# Patient Record
Sex: Male | Born: 1940
Health system: Southern US, Community
[De-identification: ages and names within clinical notes are randomized; demographics above are authoritative.]

## PROBLEM LIST (undated history)

## (undated) DIAGNOSIS — M549 Dorsalgia, unspecified: Secondary | ICD-10-CM

## (undated) DIAGNOSIS — R112 Nausea with vomiting, unspecified: Secondary | ICD-10-CM

## (undated) DIAGNOSIS — R351 Nocturia: Secondary | ICD-10-CM

## (undated) DIAGNOSIS — Z860100 Personal history of colon polyps, unspecified: Secondary | ICD-10-CM

## (undated) DIAGNOSIS — G8929 Other chronic pain: Secondary | ICD-10-CM

## (undated) DIAGNOSIS — R142 Eructation: Secondary | ICD-10-CM

## (undated) DIAGNOSIS — Z9889 Other specified postprocedural states: Secondary | ICD-10-CM

## (undated) DIAGNOSIS — L853 Xerosis cutis: Secondary | ICD-10-CM

## (undated) DIAGNOSIS — K219 Gastro-esophageal reflux disease without esophagitis: Secondary | ICD-10-CM

## (undated) DIAGNOSIS — I10 Essential (primary) hypertension: Secondary | ICD-10-CM

## (undated) HISTORY — PX: BACK SURGERY: SHX140

## (undated) HISTORY — PX: EYE SURGERY: SHX253

## (undated) HISTORY — PX: TONSILLECTOMY: SUR1361

## (undated) HISTORY — PX: COLONOSCOPY: SHX174

---
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
## 1998-06-15 HISTORY — PX: CERVICAL FUSION: SHX112

## 1998-12-10 ENCOUNTER — Encounter: Payer: Self-pay | Admitting: Neurosurgery

## 1998-12-10 ENCOUNTER — Inpatient Hospital Stay (HOSPITAL_COMMUNITY): Admission: RE | Admit: 1998-12-10 | Discharge: 1998-12-11 | Payer: Self-pay | Admitting: Neurosurgery

## 2000-04-08 ENCOUNTER — Ambulatory Visit (HOSPITAL_COMMUNITY): Admission: RE | Admit: 2000-04-08 | Discharge: 2000-04-08 | Payer: Self-pay | Admitting: Gastroenterology

## 2001-10-06 ENCOUNTER — Encounter: Payer: Self-pay | Admitting: Family Medicine

## 2001-10-06 ENCOUNTER — Ambulatory Visit (HOSPITAL_COMMUNITY): Admission: RE | Admit: 2001-10-06 | Discharge: 2001-10-06 | Payer: Self-pay | Admitting: Family Medicine

## 2005-12-28 ENCOUNTER — Ambulatory Visit: Payer: Self-pay | Admitting: Internal Medicine

## 2005-12-29 ENCOUNTER — Ambulatory Visit: Payer: Self-pay | Admitting: Internal Medicine

## 2006-01-20 ENCOUNTER — Ambulatory Visit: Payer: Self-pay | Admitting: Internal Medicine

## 2006-06-15 HISTORY — PX: CATARACT EXTRACTION: SUR2

## 2006-11-15 ENCOUNTER — Ambulatory Visit: Payer: Self-pay | Admitting: Internal Medicine

## 2006-11-15 LAB — CONVERTED CEMR LAB
ALT: 34 units/L (ref 0–40)
AST: 26 units/L (ref 0–37)
Albumin: 3.9 g/dL (ref 3.5–5.2)
Alkaline Phosphatase: 79 units/L (ref 39–117)
BUN: 23 mg/dL (ref 6–23)
Basophils Absolute: 0 10*3/uL (ref 0.0–0.1)
Basophils Relative: 0.7 % (ref 0.0–1.0)
Bilirubin Urine: NEGATIVE
Bilirubin, Direct: 0.2 mg/dL (ref 0.0–0.3)
CO2: 31 meq/L (ref 19–32)
Calcium: 9 mg/dL (ref 8.4–10.5)
Chloride: 105 meq/L (ref 96–112)
Cholesterol: 209 mg/dL (ref 0–200)
Creatinine, Ser: 1.1 mg/dL (ref 0.4–1.5)
Direct LDL: 115.5 mg/dL
Eosinophils Absolute: 0.1 10*3/uL (ref 0.0–0.6)
Eosinophils Relative: 2.4 % (ref 0.0–5.0)
GFR calc Af Amer: 87 mL/min
GFR calc non Af Amer: 72 mL/min
Glucose, Bld: 113 mg/dL — ABNORMAL HIGH (ref 70–99)
HCT: 46.9 % (ref 39.0–52.0)
HDL: 34.9 mg/dL — ABNORMAL LOW (ref >39.0)
Hemoglobin, Urine: NEGATIVE
Hemoglobin: 16 g/dL (ref 13.0–17.0)
Ketones, ur: NEGATIVE mg/dL
Leukocytes, UA: NEGATIVE
Lymphocytes Relative: 28.9 % (ref 12.0–46.0)
MCHC: 34.1 g/dL (ref 30.0–36.0)
MCV: 84 fL (ref 78.0–100.0)
Monocytes Absolute: 0.5 10*3/uL (ref 0.2–0.7)
Monocytes Relative: 8.2 % (ref 3.0–11.0)
Neutro Abs: 3.8 10*3/uL (ref 1.4–7.7)
Neutrophils Relative %: 59.8 % (ref 43.0–77.0)
Nitrite: NEGATIVE
PSA: 0.31 ng/mL (ref 0.10–4.00)
Platelets: 200 10*3/uL (ref 150–400)
Potassium: 4.7 meq/L (ref 3.5–5.1)
RBC: 5.59 M/uL (ref 4.22–5.81)
RDW: 12.8 % (ref 11.5–14.6)
Sodium: 141 meq/L (ref 135–145)
Specific Gravity, Urine: 1.025 (ref 1.000–1.03)
TSH: 0.97 microintl units/mL (ref 0.35–5.50)
Total Bilirubin: 1.5 mg/dL — ABNORMAL HIGH (ref 0.3–1.2)
Total CHOL/HDL Ratio: 6
Total Protein, Urine: NEGATIVE mg/dL
Total Protein: 6.8 g/dL (ref 6.0–8.3)
Triglycerides: 183 mg/dL — ABNORMAL HIGH (ref 0–149)
Urine Glucose: NEGATIVE mg/dL
Urobilinogen, UA: 0.2 (ref 0.0–1.0)
VLDL: 37 mg/dL (ref 0–40)
WBC: 6.2 10*3/uL (ref 4.5–10.5)
pH: 6 (ref 5.0–8.0)

## 2006-11-16 ENCOUNTER — Ambulatory Visit: Payer: Self-pay | Admitting: Internal Medicine

## 2006-12-08 ENCOUNTER — Ambulatory Visit: Payer: Self-pay

## 2007-08-23 ENCOUNTER — Ambulatory Visit: Payer: Self-pay | Admitting: Internal Medicine

## 2007-08-23 DIAGNOSIS — R1012 Left upper quadrant pain: Secondary | ICD-10-CM

## 2007-08-23 DIAGNOSIS — I739 Peripheral vascular disease, unspecified: Secondary | ICD-10-CM | POA: Insufficient documentation

## 2007-08-23 DIAGNOSIS — R1032 Left lower quadrant pain: Secondary | ICD-10-CM | POA: Insufficient documentation

## 2007-08-26 ENCOUNTER — Ambulatory Visit: Payer: Self-pay | Admitting: Internal Medicine

## 2007-08-26 LAB — CONVERTED CEMR LAB
ALT: 34 units/L (ref 0–53)
AST: 24 units/L (ref 0–37)
Albumin: 4.1 g/dL (ref 3.5–5.2)
Alkaline Phosphatase: 85 units/L (ref 39–117)
BUN: 17 mg/dL (ref 6–23)
Basophils Absolute: 0 10*3/uL (ref 0.0–0.1)
Basophils Relative: 0.5 % (ref 0.0–1.0)
Bilirubin Urine: NEGATIVE
Bilirubin, Direct: 0.2 mg/dL (ref 0.0–0.3)
CO2: 33 meq/L — ABNORMAL HIGH (ref 19–32)
Calcium: 9.2 mg/dL (ref 8.4–10.5)
Chloride: 103 meq/L (ref 96–112)
Creatinine, Ser: 1.1 mg/dL (ref 0.4–1.5)
Eosinophils Absolute: 0.1 10*3/uL (ref 0.0–0.6)
Eosinophils Relative: 1.8 % (ref 0.0–5.0)
GFR calc Af Amer: 87 mL/min
GFR calc non Af Amer: 72 mL/min
Glucose, Bld: 153 mg/dL — ABNORMAL HIGH (ref 70–99)
HCT: 50 % (ref 39.0–52.0)
Hemoglobin, Urine: NEGATIVE
Hemoglobin: 16.6 g/dL (ref 13.0–17.0)
Ketones, ur: NEGATIVE mg/dL
Leukocytes, UA: NEGATIVE
Lymphocytes Relative: 27.2 % (ref 12.0–46.0)
MCHC: 33.2 g/dL (ref 30.0–36.0)
MCV: 85.2 fL (ref 78.0–100.0)
Monocytes Absolute: 0.6 10*3/uL (ref 0.2–0.7)
Monocytes Relative: 8.6 % (ref 3.0–11.0)
Neutro Abs: 4.1 10*3/uL (ref 1.4–7.7)
Neutrophils Relative %: 61.9 % (ref 43.0–77.0)
Nitrite: NEGATIVE
Platelets: 194 10*3/uL (ref 150–400)
Potassium: 4.4 meq/L (ref 3.5–5.1)
RBC: 5.87 M/uL — ABNORMAL HIGH (ref 4.22–5.81)
RDW: 13.1 % (ref 11.5–14.6)
Sed Rate: 4 mm/hr (ref 0–20)
Sodium: 141 meq/L (ref 135–145)
Specific Gravity, Urine: 1.025 (ref 1.000–1.03)
Total Bilirubin: 1.5 mg/dL — ABNORMAL HIGH (ref 0.3–1.2)
Total Protein, Urine: NEGATIVE mg/dL
Total Protein: 7.1 g/dL (ref 6.0–8.3)
Urine Glucose: NEGATIVE mg/dL
Urobilinogen, UA: 0.2 (ref 0.0–1.0)
WBC: 6.6 10*3/uL (ref 4.5–10.5)
pH: 5.5 (ref 5.0–8.0)

## 2007-11-29 ENCOUNTER — Telehealth: Payer: Self-pay | Admitting: Internal Medicine

## 2008-04-24 ENCOUNTER — Ambulatory Visit: Payer: Self-pay | Admitting: Internal Medicine

## 2008-04-24 DIAGNOSIS — K921 Melena: Secondary | ICD-10-CM | POA: Insufficient documentation

## 2008-04-24 DIAGNOSIS — K649 Unspecified hemorrhoids: Secondary | ICD-10-CM | POA: Insufficient documentation

## 2008-04-25 LAB — CONVERTED CEMR LAB
ALT: 36 units/L (ref 0–53)
AST: 30 units/L (ref 0–37)
Albumin: 4.2 g/dL (ref 3.5–5.2)
Alkaline Phosphatase: 74 units/L (ref 39–117)
BUN: 21 mg/dL (ref 6–23)
Basophils Absolute: 0 10*3/uL (ref 0.0–0.1)
Basophils Relative: 0.1 % (ref 0.0–3.0)
Bilirubin Urine: NEGATIVE
Bilirubin, Direct: 0.2 mg/dL (ref 0.0–0.3)
CO2: 30 meq/L (ref 19–32)
Calcium: 9.5 mg/dL (ref 8.4–10.5)
Chloride: 104 meq/L (ref 96–112)
Cholesterol: 185 mg/dL (ref 0–200)
Creatinine, Ser: 1.1 mg/dL (ref 0.4–1.5)
Eosinophils Absolute: 0.1 10*3/uL (ref 0.0–0.7)
Eosinophils Relative: 1.3 % (ref 0.0–5.0)
GFR calc Af Amer: 86 mL/min
GFR calc non Af Amer: 71 mL/min
Glucose, Bld: 112 mg/dL — ABNORMAL HIGH (ref 70–99)
HCT: 47.7 % (ref 39.0–52.0)
HDL: 32.9 mg/dL — ABNORMAL LOW (ref >39.0)
Hemoglobin, Urine: NEGATIVE
Hemoglobin: 16.6 g/dL (ref 13.0–17.0)
Ketones, ur: NEGATIVE mg/dL
LDL Cholesterol: 126 mg/dL — ABNORMAL HIGH (ref 0–99)
Leukocytes, UA: NEGATIVE
Lymphocytes Relative: 29 % (ref 12.0–46.0)
MCHC: 34.7 g/dL (ref 30.0–36.0)
MCV: 86.1 fL (ref 78.0–100.0)
Monocytes Absolute: 0.7 10*3/uL (ref 0.1–1.0)
Monocytes Relative: 10.2 % (ref 3.0–12.0)
Neutro Abs: 4.2 10*3/uL (ref 1.4–7.7)
Neutrophils Relative %: 59.4 % (ref 43.0–77.0)
Nitrite: NEGATIVE
PSA: 0.39 ng/mL (ref 0.10–4.00)
Platelets: 199 10*3/uL (ref 150–400)
Potassium: 5.1 meq/L (ref 3.5–5.1)
RBC: 5.55 M/uL (ref 4.22–5.81)
RDW: 12.9 % (ref 11.5–14.6)
Sodium: 140 meq/L (ref 135–145)
Specific Gravity, Urine: 1.01 (ref 1.000–1.03)
TSH: 1.02 microintl units/mL (ref 0.35–5.50)
Total Bilirubin: 1.7 mg/dL — ABNORMAL HIGH (ref 0.3–1.2)
Total CHOL/HDL Ratio: 5.6
Total Protein, Urine: NEGATIVE mg/dL
Total Protein: 7.1 g/dL (ref 6.0–8.3)
Triglycerides: 129 mg/dL (ref 0–149)
Urine Glucose: NEGATIVE mg/dL
Urobilinogen, UA: 0.2 (ref 0.0–1.0)
VLDL: 26 mg/dL (ref 0–40)
WBC: 7.1 10*3/uL (ref 4.5–10.5)
pH: 7 (ref 5.0–8.0)

## 2008-05-18 ENCOUNTER — Ambulatory Visit: Payer: Self-pay

## 2008-05-18 ENCOUNTER — Encounter: Payer: Self-pay | Admitting: Internal Medicine

## 2008-07-16 ENCOUNTER — Ambulatory Visit: Payer: Self-pay | Admitting: Internal Medicine

## 2008-07-16 DIAGNOSIS — I889 Nonspecific lymphadenitis, unspecified: Secondary | ICD-10-CM | POA: Insufficient documentation

## 2008-07-16 DIAGNOSIS — J029 Acute pharyngitis, unspecified: Secondary | ICD-10-CM | POA: Insufficient documentation

## 2008-07-17 LAB — CONVERTED CEMR LAB
Basophils Absolute: 0 10*3/uL (ref 0.0–0.1)
Basophils Relative: 0.1 % (ref 0.0–3.0)
Eosinophils Absolute: 0.2 10*3/uL (ref 0.0–0.7)
Eosinophils Relative: 2.3 % (ref 0.0–5.0)
HCT: 45.5 % (ref 39.0–52.0)
Hemoglobin: 16 g/dL (ref 13.0–17.0)
Lymphocytes Relative: 28.6 % (ref 12.0–46.0)
MCHC: 35.2 g/dL (ref 30.0–36.0)
MCV: 85.9 fL (ref 78.0–100.0)
Monocytes Absolute: 0.9 10*3/uL (ref 0.1–1.0)
Monocytes Relative: 9.5 % (ref 3.0–12.0)
Neutro Abs: 5.3 10*3/uL (ref 1.4–7.7)
Neutrophils Relative %: 59.5 % (ref 43.0–77.0)
Platelets: 236 10*3/uL (ref 150–400)
RBC: 5.29 M/uL (ref 4.22–5.81)
RDW: 13 % (ref 11.5–14.6)
WBC: 9 10*3/uL (ref 4.5–10.5)

## 2009-07-04 ENCOUNTER — Ambulatory Visit: Payer: Self-pay | Admitting: Internal Medicine

## 2009-07-04 DIAGNOSIS — Z87891 Personal history of nicotine dependence: Secondary | ICD-10-CM | POA: Insufficient documentation

## 2009-07-05 ENCOUNTER — Telehealth (INDEPENDENT_AMBULATORY_CARE_PROVIDER_SITE_OTHER): Payer: Self-pay | Admitting: *Deleted

## 2009-07-09 ENCOUNTER — Encounter: Payer: Self-pay | Admitting: Internal Medicine

## 2009-07-09 DIAGNOSIS — I6529 Occlusion and stenosis of unspecified carotid artery: Secondary | ICD-10-CM | POA: Insufficient documentation

## 2009-07-10 ENCOUNTER — Ambulatory Visit: Payer: Self-pay

## 2009-07-10 ENCOUNTER — Encounter: Payer: Self-pay | Admitting: Internal Medicine

## 2010-01-31 ENCOUNTER — Ambulatory Visit: Payer: Self-pay | Admitting: Internal Medicine

## 2010-01-31 DIAGNOSIS — M79609 Pain in unspecified limb: Secondary | ICD-10-CM | POA: Insufficient documentation

## 2010-07-13 LAB — CONVERTED CEMR LAB
ALT: 38 units/L (ref 0–53)
AST: 30 units/L (ref 0–37)
Albumin: 4.4 g/dL (ref 3.5–5.2)
Alkaline Phosphatase: 81 units/L (ref 39–117)
BUN: 15 mg/dL (ref 6–23)
Basophils Absolute: 0 10*3/uL (ref 0.0–0.1)
Basophils Relative: 0.7 % (ref 0.0–3.0)
Bilirubin Urine: NEGATIVE
Bilirubin, Direct: 0.2 mg/dL (ref 0.0–0.3)
CO2: 30 meq/L (ref 19–32)
Calcium: 9.5 mg/dL (ref 8.4–10.5)
Chloride: 106 meq/L (ref 96–112)
Cholesterol: 207 mg/dL — ABNORMAL HIGH (ref 0–200)
Creatinine, Ser: 1.1 mg/dL (ref 0.4–1.5)
Direct LDL: 147.1 mg/dL
Eosinophils Absolute: 0.1 10*3/uL (ref 0.0–0.7)
Eosinophils Relative: 1.5 % (ref 0.0–5.0)
GFR calc non Af Amer: 71.07 mL/min (ref >60)
Glucose, Bld: 116 mg/dL — ABNORMAL HIGH (ref 70–99)
HCT: 48.6 % (ref 39.0–52.0)
HDL: 35.4 mg/dL — ABNORMAL LOW (ref >39.00)
Hemoglobin, Urine: NEGATIVE
Hemoglobin: 15.8 g/dL (ref 13.0–17.0)
Ketones, ur: NEGATIVE mg/dL
Leukocytes, UA: NEGATIVE
Lymphocytes Relative: 28.7 % (ref 12.0–46.0)
Lymphs Abs: 1.8 10*3/uL (ref 0.7–4.0)
MCHC: 32.6 g/dL (ref 30.0–36.0)
MCV: 89.2 fL (ref 78.0–100.0)
Monocytes Absolute: 0.5 10*3/uL (ref 0.1–1.0)
Monocytes Relative: 8.8 % (ref 3.0–12.0)
Neutro Abs: 3.8 10*3/uL (ref 1.4–7.7)
Neutrophils Relative %: 60.3 % (ref 43.0–77.0)
Nitrite: NEGATIVE
PSA: 0.35 ng/mL (ref 0.10–4.00)
Platelets: 218 10*3/uL (ref 150.0–400.0)
Potassium: 4.8 meq/L (ref 3.5–5.1)
RBC: 5.45 M/uL (ref 4.22–5.81)
RDW: 13 % (ref 11.5–14.6)
Sodium: 142 meq/L (ref 135–145)
Specific Gravity, Urine: 1.025 (ref 1.000–1.030)
TSH: 1.32 microintl units/mL (ref 0.35–5.50)
Total Bilirubin: 1.6 mg/dL — ABNORMAL HIGH (ref 0.3–1.2)
Total CHOL/HDL Ratio: 6
Total Protein, Urine: NEGATIVE mg/dL
Total Protein: 7.4 g/dL (ref 6.0–8.3)
Triglycerides: 159 mg/dL — ABNORMAL HIGH (ref 0.0–149.0)
Urine Glucose: NEGATIVE mg/dL
Urobilinogen, UA: 0.2 (ref 0.0–1.0)
VLDL: 31.8 mg/dL (ref 0.0–40.0)
WBC: 6.2 10*3/uL (ref 4.5–10.5)
pH: 5 (ref 5.0–8.0)

## 2010-07-15 ENCOUNTER — Encounter: Payer: Self-pay | Admitting: Internal Medicine

## 2010-07-15 ENCOUNTER — Ambulatory Visit: Admission: RE | Admit: 2010-07-15 | Discharge: 2010-07-15 | Payer: Self-pay | Source: Home / Self Care

## 2010-07-17 NOTE — Miscellaneous (Signed)
Summary: Orders Update  Clinical Lists Changes  Problems: Added new problem of CAROTID ARTERY STENOSIS (ICD-433.10) Orders: Added new Test order of Carotid Duplex (Carotid Duplex) - Signed 

## 2010-07-17 NOTE — Assessment & Plan Note (Signed)
Summary: sore throat x 2 wk---stc   Vital Signs:  Patient Profile:   70 Years Old Male Weight:      223 pounds Temp:     97.1 degrees F oral Pulse rate:   64 / minute BP sitting:   118 / 66  (left arm) Cuff size:   large  Vitals Entered By: Zackery Barefoot CMA (July 16, 2008 2:57 PM)                 Chief Complaint:  Sore throat x 3 weeks.  History of Present Illness: C/o ST x 3 wks, not better R>L    Prior Medications Reviewed Using: Patient Recall  Updated Prior Medication List: VITAMIN D3 1000 UNIT  TABS (CHOLECALCIFEROL) 1 by mouth daily FISH OIL 1000 MG CAPS (OMEGA-3 FATTY ACIDS) Take 1 tablet by mouth two times a day LEVITRA 20 MG  TABS (VARDENAFIL HCL) 1 once daily as needed  Current Allergies (reviewed today): ! PCN ! SULFA  Past Medical History:    Reviewed history from 08/23/2007 and no changes required:       Peripheral vascular disease up to 59% RICA stenosis       Elev. glu   Family History:    Reviewed history from 08/23/2007 and no changes required:              F COPD  Social History:    Reviewed history from 08/23/2007 and no changes required:       Retired Building services engineer       Married       Regular exercise-yes: tennis, work outs    Review of Systems  The patient denies fever, hoarseness, chest pain, dyspnea on exertion, peripheral edema, and prolonged cough.     Physical Exam  General:     Well-developed,well-nourished,in no acute distress; alert,appropriate and cooperative throughout examination Ears:     Wax B Nose:     Erythematous throat mucosa and intranasal erythema.  Mouth:     Oral mucosa and oropharynx without lesions or exudates.  Teeth in good repair. Gloved finger exam - no masses Neck:     No deformities, masses, or tenderness noted. Lungs:     Normal respiratory effort, chest expands symmetrically. Lungs are clear to auscultation, no crackles or wheezes. Heart:     Normal rate and regular rhythm. S1 and S2 normal  without gallop, murmur, click, rub or other extra sounds. Skin:     Intact without suspicious lesions or rashes Cervical Nodes:       Enlarged cluster of 2-3 1 cm mobile and tender LNs in R submandibular area    Impression & Recommendations:  Problem # 1:  LYMPHADENITIS (ICD-289.3) R submandib Assessment: New Z pac Orders: TLB-CBC Platelet - w/Differential (85025-CBCD)   Problem # 2:  PHARYNGITIS (ICD-462) Assessment: New  His updated medication list for this problem includes:    Zithromax Z-pak 250 Mg Tabs (Azithromycin) ..... Use as directed   Complete Medication List: 1)  Vitamin D3 1000 Unit Tabs (Cholecalciferol) .Marland Kitchen.. 1 by mouth daily 2)  Fish Oil 1000 Mg Caps (Omega-3 fatty acids) .... Take 1 tablet by mouth two times a day 3)  Levitra 20 Mg Tabs (Vardenafil hcl) .Marland Kitchen.. 1 once daily as needed 4)  Zithromax Z-pak 250 Mg Tabs (Azithromycin) .... Use as directed   Patient Instructions: 1)  Call if you are not better in a reasonable ammount of time or if worse.    Prescriptions: ZITHROMAX Z-PAK  250 MG  TABS (AZITHROMYCIN) Use as directed  #1 x 0   Entered and Authorized by:   Tresa Garter MD   Signed by:   Tresa Garter MD on 07/16/2008   Method used:   Print then Give to Patient   RxID:   [id number]

## 2010-07-17 NOTE — Assessment & Plan Note (Signed)
Summary: LOWER  ABDOMAN PAIN-$50 INFO-STC   Vital Signs:  Patient Profile:   70 Years Old Male Weight:      221 pounds Temp:     97.5 degrees F oral Pulse rate:   66 / minute BP sitting:   137 / 73  (left arm)  Vitals Entered By: Tora Perches (August 23, 2007 8:35 AM)             Is Patient Diabetic? No     Chief Complaint:  Multiple medical problems or concerns.  History of Present Illness: C/o burning pain in L groin x 4-5d severe off and on, no irrad. to genitals; symptoms  mostly present at night. x 1 wk. No LBP. No injury.    Current Allergies: ! PCN ! SULFA  Past Medical History:    Peripheral vascular disease up to 59% RICA stenosis    Elev. glu   Family History:        F COPD  Social History:    Retired Building services engineer    Married    Regular exercise-yes: tennis, work outs   Risk Factors:  Tobacco use:  quit Exercise:  yes   Review of Systems  The patient denies anorexia, fever, chest pain, prolonged cough, hemoptysis, melena, hematochezia, severe indigestion/heartburn, and hematuria.     Physical Exam  General:     Well-developed,well-nourished,in no acute distress; alert,appropriate and cooperative throughout examination Eyes:     No corneal or conjunctival inflammation noted. EOMI. Perrla. Funduscopic exam benign, without hemorrhages, exudates or papilledema. Vision grossly normal. Mouth:     Oral mucosa and oropharynx without lesions or exudates.  Teeth in good repair. Neck:     No deformities, masses, or tenderness noted. Lungs:     Normal respiratory effort, chest expands symmetrically. Lungs are clear to auscultation, no crackles or wheezes. Heart:     Normal rate and regular rhythm. S1 and S2 normal without gallop, murmur, click, rub or other extra sounds. Abdomen:     Sensitive in LLQ, no mass or rebound. L groin is sensitive w/o mass or hernia Genitalia:     Testes bilaterally descended without nodularity, tenderness or masses. No scrotal  masses or lesions. No penis lesions or urethral discharge. Msk:     No deformity or scoliosis noted of thoracic or lumbar spine.   Pulses:     R and L carotid,radial,femoral,dorsalis pedis and posterior tibial pulses are full and equal bilaterally Extremities:     No clubbing, cyanosis, edema, or deformity noted with normal full range of motion of all joints.   Neurologic:     No cranial nerve deficits noted. Station and gait are normal. Plantar reflexes are down-going bilaterally. DTRs are symmetrical throughout. Sensory, motor and coordinative functions appear intact. Skin:     No rash Inguinal Nodes:     No significant adenopathy Psych:     Cognition and judgment appear intact. Alert and cooperative with normal attention span and concentration. No apparent delusions, illusions, hallucinations    Impression & Recommendations:  Problem # 1:  L groin pain x 1 wk Assessment: New Unclear etiol: r/o infection, neuropathy, shingles, sprain, artery dissection, etc. Empiric abx. To ER if worse. Get labs.CT pelvis and L groin. Vicodin prn  Problem # 2:  ABDOMINAL PAIN (ICD-789.00) Assessment: New As above Orders: TLB-BMP (Basic Metabolic Panel-BMET) (80048-METABOL) TLB-Hepatic/Liver Function Pnl (80076-HEPATIC) TLB-Udip ONLY (81003-UDIP) TLB-CBC Platelet - w/Differential (85025-CBCD) TLB-Sedimentation Rate (ESR) (16109-UEA) Radiology Referral (Radiology)   Complete Medication List: 1)  Cipro 500 Mg Tabs (Ciprofloxacin hcl) .Marland Kitchen.. 1 by mouth 2 times daily 2)  Hydrocodone-acetaminophen 5-500 Mg Tabs (Hydrocodone-acetaminophen) .Marland Kitchen.. 1-2 by mouth qid as needed pain 3)  Vitamin D3 1000 Unit Tabs (Cholecalciferol) .Marland Kitchen.. 1 by mouth daily 4)  Lovaza 1 Gm Caps (Omega-3-acid ethyl esters) .... 2 by mouth bid 5)  Aspirin 81 Mg Tbec (Aspirin) .... One by mouth every day   Patient Instructions: 1)  Please schedule a follow-up appointment in 1 weeks.    Prescriptions: CIPRO 500 MG TABS  (CIPROFLOXACIN HCL) 1 by mouth 2 times daily  #20 x 0   Entered and Authorized by:   Tresa Garter MD   Signed by:   Tresa Garter MD on 08/23/2007   Method used:   Print then Give to Patient   RxID:   [id number]  ]

## 2010-07-17 NOTE — Assessment & Plan Note (Signed)
Summary: YEARLY---$50---STC   Vital Signs:  Patient Profile:   70 Years Old Male Weight:      217 pounds Temp:     97.3 degrees F oral Pulse rate:   60 / minute BP sitting:   134 / 76  (left arm)  Vitals Entered By: Tora Perches (April 24, 2008 10:46 AM)                 Chief Complaint:  Preventive Care.  History of Present Illness: The patient presents for a wellness examination   F/u PVD, elev. glu  C/o occ rectal irritation, leaking and a little blood x 18 months after vigurous sports    Current Allergies (reviewed today): ! PCN ! SULFA  Past Medical History:    Reviewed history from 08/23/2007 and no changes required:       Peripheral vascular disease up to 59% RICA stenosis       Elev. glu   Family History:    Reviewed history from 08/23/2007 and no changes required:              F COPD  Social History:    Reviewed history from 08/23/2007 and no changes required:       Retired Building services engineer       Married       Regular exercise-yes: tennis, work outs    Review of Systems  The patient denies anorexia, fever, weight loss, weight gain, vision loss, decreased hearing, hoarseness, chest pain, syncope, dyspnea on exertion, peripheral edema, prolonged cough, headaches, hemoptysis, abdominal pain, melena, hematochezia, severe indigestion/heartburn, hematuria, incontinence, genital sores, muscle weakness, suspicious skin lesions, transient blindness, difficulty walking, depression, unusual weight change, abnormal bleeding, enlarged lymph nodes, angioedema, and testicular masses.     Physical Exam  General:     Well-developed,well-nourished,in no acute distress; alert,appropriate and cooperative throughout examination Head:     Normocephalic and atraumatic without obvious abnormalities. No apparent alopecia or balding. Eyes:     No corneal or conjunctival inflammation noted. EOMI. Perrla. Funduscopic exam benign, without hemorrhages, exudates or papilledema. Vision  grossly normal. Ears:     External ear exam shows no significant lesions or deformities.  Otoscopic examination reveals clear canals, tympanic membranes are intact bilaterally without bulging, retraction, inflammation or discharge. Hearing is grossly normal bilaterally. Nose:     External nasal examination shows no deformity or inflammation. Nasal mucosa are pink and moist without lesions or exudates. Mouth:     Oral mucosa and oropharynx without lesions or exudates.  Teeth in good repair. Neck:     No deformities, masses, or tenderness noted. Lungs:     Normal respiratory effort, chest expands symmetrically. Lungs are clear to auscultation, no crackles or wheezes. Heart:     Normal rate and regular rhythm. S1 and S2 normal without gallop, murmur, click, rub or other extra sounds. Abdomen:     Bowel sounds positive,abdomen soft and non-tender without masses, organomegaly or hernias noted. Rectal:     No external abnormalities noted. Normal sphincter tone. No rectal masses or tenderness. Irritated anal area with a leaked stool Genitalia:     Testes bilaterally descended without nodularity, tenderness or masses. No scrotal masses or lesions. No penis lesions or urethral discharge. Prostate:     Prostate gland firm and smooth, no enlargement, nodularity, tenderness, mass, asymmetry or induration. Msk:     No deformity or scoliosis noted of thoracic or lumbar spine.   Pulses:     R and L carotid,radial,femoral,dorsalis  pedis and posterior tibial pulses are full and equal bilaterally Extremities:     No clubbing, cyanosis, edema, or deformity noted with normal full range of motion of all joints.   Neurologic:     No cranial nerve deficits noted. Station and gait are normal. Plantar reflexes are down-going bilaterally. DTRs are symmetrical throughout. Sensory, motor and coordinative functions appear intact. Skin:     Intact without suspicious lesions or rashes Cervical Nodes:     No  lymphadenopathy noted Inguinal Nodes:     No significant adenopathy Psych:     Cognition and judgment appear intact. Alert and cooperative with normal attention span and concentration. No apparent delusions, illusions, hallucinations    Impression & Recommendations:  Problem # 1:  WELL ADULT EXAM (ICD-V70.0) Assessment: New Had a colonosc 5 y ago - Dr Laural Benes. dT given.    Orders: TLB-BMP (Basic Metabolic Panel-BMET) (80048-METABOL) TLB-CBC Platelet - w/Differential (85025-CBCD) TLB-Lipid Panel (80061-LIPID) TLB-PSA (Prostate Specific Antigen) (84153-PSA) TLB-TSH (Thyroid Stimulating Hormone) (84443-TSH) TLB-Udip ONLY (81003-UDIP) TLB-Hepatic/Liver Function Pnl (80076-HEPATIC)  Reviewed preventive care protocols, scheduled due services, and updated immunizations.   Problem # 2:  HEMORRHOIDS, NOS (ICD-455.6) Assessment: New Rx given  Problem # 3:  PERIPHERAL VASCULAR DISEASE (ICD-443.9) Assessment: Comment Only  Orders: Doppler Referral (Doppler)   Problem # 4:  HEMATOCHEZIA (ICD-578.1) - mild Assessment: Comment Only Had a colon by Dr Laural Benes 5 y ago. Had a pelvic CT 2009 Rx given Orders: Anoscopy (16109)  Reviewed preventive care protocols, scheduled due services, and updated immunizations. Procedure: Anoscopy Indication: Rectal bleeding Risks and benefits were explained. The pt. was placed in the L decubitus position. Digital rectal exam revealed no masses. Anoscope was introduced w/o difficulties. Upon withdrawl, a carefull look at the mucosa was obtained. At 6  o'clock a    9    mm int hemorrhoid was present without active bleeding. Impression: No anal fissure.Internal hemorrhoid. Mild proctitis Disposition: see A&P.  Tolerated well. Complications: none.    Complete Medication List: 1)  Vitamin D3 1000 Unit Tabs (Cholecalciferol) .Marland Kitchen.. 1 by mouth daily 2)  Lovaza 1 Gm Caps (Omega-3-acid ethyl esters) .... 2 by mouth bid 3)  Levitra 20 Mg Tabs (Vardenafil  hcl) .Marland Kitchen.. 1 once daily as needed 4)  Proctocort 30 Mg Supp (Hydrocortisone acetate) .Marland Kitchen.. 1 pr at bedtime x 10 d 5)  Triamcinolone Acetonide 0.1 % Oint (Triamcinolone acetonide) .... Use two times a day  Other Orders: Pneumococcal Vaccine (60454) Admin 1st Vaccine (09811)   Patient Instructions: 1)  Use wet wipes 2)  Avoid cotton underwear   Prescriptions: TRIAMCINOLONE ACETONIDE 0.1 % OINT (TRIAMCINOLONE ACETONIDE) use two times a day  #45 g x 2   Entered and Authorized by:   Tresa Garter MD   Signed by:   Tresa Garter MD on 04/24/2008   Method used:   Print then Give to Patient   RxID:   [id number] PROCTOCORT 30 MG SUPP (HYDROCORTISONE ACETATE) 1 pr at bedtime x 10 d  #10 x 2   Entered and Authorized by:   Tresa Garter MD   Signed by:   Tresa Garter MD on 04/24/2008   Method used:   Print then Give to Patient   RxID:   [id number]  ]  Pneumovax Vaccine    Vaccine Type: Pneumovax    Site: left deltoid    Mfr: Merck    Dose: 0.5 ml    Route: IM    Given by: Erie Noe  Delorise Shiner    Exp. Date: 02/04/2009    Lot #: [id number]    VIS given: 01/11/96 version given April 24, 2008.

## 2010-07-17 NOTE — Progress Notes (Signed)
Summary: REFILL  Phone Note Call from Patient Call back at Home Phone [phone]   Summary of Call: Patient is requesting a refil of levitra 20mg  Initial call taken by: Lamar Sprinkles,  November 29, 2007 3:48 PM  Follow-up for Phone Call        Just received Rx for 12 June 10th!! Will need to wait until Dr. Roena Malady returns. Follow-up by: Jacques Navy MD,  November 30, 2007 9:50 AM  Additional Follow-up for Phone Call Additional follow up Details #1::        No vanessa denied med b/c not on med list Additional Follow-up by: Lamar Sprinkles,  November 30, 2007 11:23 AM    Additional Follow-up for Phone Call Additional follow up Details #2::    I misread that.Marland KitchenMarland KitchenMarland KitchenMarland Kitchenptient will need to see Dr. Roena Malady Follow-up by: Jacques Navy MD,  November 30, 2007 1:12 PM  Additional Follow-up for Phone Call Additional follow up Details #3:: Details for Additional Follow-up Action Taken: Pt needs apt with Dr AVP prior to med refill please.....................Marland KitchenLamar Sprinkles  November 30, 2007 3:41 PM   OK to ref x 3. Last OV in 3/9. Georgina Quint Levell Tavano MD  November 30, 2007 3:48 PM   New/Updated Medications: LEVITRA 20 MG  TABS (VARDENAFIL HCL) 1 once daily as needed   Prescriptions: LEVITRA 20 MG  TABS (VARDENAFIL HCL) 1 once daily as needed  #12 x 3   Entered by:   Lamar Sprinkles   Authorized by:   Tresa Garter MD   Signed by:   Lamar Sprinkles on 11/30/2007   Method used:   Electronically sent to ...       [facility]  [address]  [phone]       [address]       [place], [place]  [address]       Ph: [phone] or [phone]       Fax: [phone]   RxID:   [id number]

## 2010-07-17 NOTE — Assessment & Plan Note (Signed)
Summary: CPX/MEDICARE/#/CD   Vital Signs:  Patient profile:   70 year old male Height:      74 inches Weight:      217 pounds BMI:     27.96 Temp:     98.3 degrees F oral Pulse rate:   55 / minute BP sitting:   158 / 76  (left arm)  Vitals Entered By: Tora Perches (July 04, 2009 9:04 AM) CC: cpx Is Patient Diabetic? No   CC:  cpx.  History of Present Illness: The patient presents for a wellness examination   Preventive Screening-Counseling & Management  Alcohol-Tobacco     Smoking Status: quit  Current Medications (verified): 1)  Vitamin D3 1000 Unit  Tabs (Cholecalciferol) .Marland Kitchen.. 1 By Mouth Daily 2)  Fish Oil 1000 Mg Caps (Omega-3 Fatty Acids) .... Take 1 Tablet By Mouth Two Times A Day 3)  Levitra 20 Mg  Tabs (Vardenafil Hcl) .Marland Kitchen.. 1 Once Daily As Needed  Allergies: 1)  ! Pcn 2)  ! Sulfa  Past History:  Family History: Last updated: 08/23/2007  F COPD  Social History: Last updated: 08/23/2007 Retired CFO Married Regular exercise-yes: tennis, work outs  Past Medical History: Peripheral vascular disease up to 59% RICA stenosis (refusing statins) Elev. glu GI Dr Laural Benes 2005  Family History: Reviewed history from 08/23/2007 and no changes required.  F COPD  Social History: Reviewed history from 08/23/2007 and no changes required. Retired [employer] Married Regular exercise-yes: tennis, work outs  Review of Systems  The patient denies anorexia, fever, weight loss, weight gain, vision loss, decreased hearing, hoarseness, chest pain, syncope, dyspnea on exertion, peripheral edema, prolonged cough, headaches, hemoptysis, abdominal pain, melena, hematochezia, severe indigestion/heartburn, hematuria, incontinence, genital sores, muscle weakness, suspicious skin lesions, transient blindness, difficulty walking, depression, unusual weight change, abnormal bleeding, enlarged lymph nodes, angioedema, and testicular masses.    Physical Exam  General:   Well-developed,well-nourished,in no acute distress; alert,appropriate and cooperative throughout examination Head:  Normocephalic and atraumatic without obvious abnormalities. No apparent alopecia or balding. Eyes:  No corneal or conjunctival inflammation noted. EOMI. Perrla.  Ears:  External ear exam shows no significant lesions or deformities.  Otoscopic examination reveals clear canals, tympanic membranes are intact bilaterally without bulging, retraction, inflammation or discharge. Hearing is grossly normal bilaterally. Nose:  External nasal examination shows no deformity or inflammation. Nasal mucosa are pink and moist without lesions or exudates. Mouth:  Oral mucosa and oropharynx without lesions or exudates.  Teeth in good repair. Neck:  No deformities, masses, or tenderness noted. Lungs:  Normal respiratory effort, chest expands symmetrically. Lungs are clear to auscultation, no crackles or wheezes. Heart:  Normal rate and regular rhythm. S1 and S2 normal without gallop, murmur, click, rub or other extra sounds. Abdomen:  Bowel sounds positive,abdomen soft and non-tender without masses, organomegaly or hernias noted. Rectal:  No external abnormalities noted. Normal sphincter tone. No rectal masses or tenderness. Genitalia:  Testes bilaterally descended without nodularity, tenderness or masses. No scrotal masses or lesions. No penis lesions or urethral discharge. Prostate:  Prostate gland firm and smooth, no enlargement, nodularity, tenderness, mass, asymmetry or induration. Msk:  No deformity or scoliosis noted of thoracic or lumbar spine.   Extremities:  No clubbing, cyanosis, edema, or deformity noted with normal full range of motion of all joints.   Neurologic:  No cranial nerve deficits noted. Station and gait are normal. Plantar reflexes are down-going bilaterally. DTRs are symmetrical throughout. Sensory, motor and coordinative functions appear intact. Skin:  Intact without suspicious  lesions or rashes Cervical Nodes:  No lymphadenopathy noted Inguinal Nodes:  No significant adenopathy Psych:  Cognition and judgment appear intact. Alert and cooperative with normal attention span and concentration. No apparent delusions, illusions, hallucinations   Impression & Recommendations:  Problem # 1:  WELL ADULT EXAM (ICD-V70.0) Assessment New Health and age related issues were discussed. Available screening tests and vaccinations were discussed as well. Healthy life style including good diet and execise was discussed.  Orders: EKG w/ Interpretation (93000) TLB-BMP (Basic Metabolic Panel-BMET) (80048-METABOL) TLB-CBC Platelet - w/Differential (85025-CBCD) TLB-Hepatic/Liver Function Pnl (80076-HEPATIC) TLB-Lipid Panel (80061-LIPID) TLB-PSA (Prostate Specific Antigen) (84153-PSA) TLB-TSH (Thyroid Stimulating Hormone) (84443-TSH) TLB-Udip ONLY (81003-UDIP)  Problem # 2:  PERIPHERAL VASCULAR DISEASE (ICD-443.9) Assessment: Unchanged  Orders: Doppler Referral (Doppler) TLB-BMP (Basic Metabolic Panel-BMET) (80048-METABOL) TLB-CBC Platelet - w/Differential (85025-CBCD) TLB-Hepatic/Liver Function Pnl (80076-HEPATIC) TLB-Lipid Panel (80061-LIPID) TLB-PSA (Prostate Specific Antigen) (84153-PSA) TLB-TSH (Thyroid Stimulating Hormone) (84443-TSH) TLB-Udip ONLY (81003-UDIP)  Problem # 3:  ERECTILE DYSFUNCTION, ORGANIC (ICD-607.84)  His updated medication list for this problem includes:    Levitra 20 Mg Tabs (Vardenafil hcl) .Marland Kitchen... 1 once daily as needed  Complete Medication List: 1)  Vitamin D3 1000 Unit Tabs (Cholecalciferol) .Marland Kitchen.. 1 by mouth daily 2)  Fish Oil 1000 Mg Caps (Omega-3 fatty acids) .... Take 1 tablet by mouth two times a day 3)  Levitra 20 Mg Tabs (Vardenafil hcl) .Marland Kitchen.. 1 once daily as needed 4)  Aspirin 81 Mg Tbec (Aspirin) .Marland Kitchen.. 1 by mouth qd  Patient Instructions: 1)  Nl BP 135/85 or less 2)  Please schedule a follow-up appointment in 6  months. Prescriptions: LEVITRA 20 MG  TABS (VARDENAFIL HCL) 1 once daily as needed  #36 x 3   Entered and Authorized by:   Tresa Garter MD   Signed by:   Tresa Garter MD on 07/04/2009   Method used:   Print then Give to Patient   RxID:   [id number]

## 2010-07-17 NOTE — Assessment & Plan Note (Signed)
Summary: severe back pain radiates into left leg/chiropr/?cortisone sh...   Vital Signs:  Patient profile:   70 year old male Height:      74 inches Weight:      224 pounds BMI:     28.86 O2 Sat:      96 % on Room air Temp:     98.3 degrees F oral Pulse rate:   69 / minute Pulse rhythm:   regular Resp:     16 per minute BP sitting:   144 / 90  (left arm) Cuff size:   regular  Vitals Entered By: Lanier Prude, CMA(AAMA) (January 31, 2010 4:28 PM)  O2 Flow:  Room air CC: LBP/Lt leg pain Is Patient Diabetic? No   CC:  LBP/Lt leg pain.  History of Present Illness: Pulled back 2 wks playing tennis, got worse in 3-4 days, now severe pain in  He went to see a chiropractor x 2  Current Medications (verified): 1)  Vitamin D3 1000 Unit  Tabs (Cholecalciferol) .Marland Kitchen.. 1 By Mouth Daily 2)  Fish Oil 1000 Mg Caps (Omega-3 Fatty Acids) .... Take 1 Tablet By Mouth Two Times A Day 3)  Levitra 20 Mg  Tabs (Vardenafil Hcl) .Marland Kitchen.. 1 Once Daily As Needed 4)  Aspirin 81 Mg Tbec (Aspirin) .Marland Kitchen.. 1 By Mouth Qd  Allergies (verified): 1)  ! Pcn 2)  ! Sulfa  Past History:  Past Medical History: Last updated: 07/04/2009 Peripheral vascular disease up to 59% RICA stenosis (refusing statins) Elev. glu GI Dr Laural Benes 2005  Family History: Last updated: 08/23/2007  F COPD  Social History: Last updated: 08/23/2007 Retired CFO Married Regular exercise-yes: tennis, work outs  Review of Systems       The patient complains of difficulty walking.  The patient denies fever and depression.         no rash  Physical Exam  General:  Well-developed,well-nourished,in no acute distress; alert,appropriate and cooperative throughout examination Eyes:  No corneal or conjunctival inflammation noted. EOMI. Perrla.  Mouth:  Oral mucosa and oropharynx without lesions or exudates.  Teeth in good repair. Neck:  No deformities, masses, or tenderness noted. Lungs:  Normal respiratory effort, chest expands  symmetrically. Lungs are clear to auscultation, no crackles or wheezes. Heart:  Normal rate and regular rhythm. S1 and S2 normal without gallop, murmur, click, rub or other extra sounds. Abdomen:  Bowel sounds positive,abdomen soft and non-tender without masses, organomegaly or hernias noted. Msk:  No deformity or scoliosis noted of thoracic or lumbar spine.  Lumbar-sacral spine is tender to palpation over paraspinal muscles and painfull with the ROM  Neurologic:  L strait leg elev is (=/-) MS OK DTRs OK Skin:  Intact without suspicious lesions or rashes Psych:  Cognition and judgment appear intact. Alert and cooperative with normal attention span and concentration. No apparent delusions, illusions, hallucinations   Impression & Recommendations:  Problem # 1:  LOW BACK PAIN, ACUTE (ICD-724.2) Assessment New Take 40mg  qd for 3 days, then 20 mg qd for 3 days, then 10mg  qd for 6 days, then stop. Take pc.  Use stretching and balance exercises that I have provided (15 min. or longer every day)  His updated medication list for this problem includes:    Aspirin 81 Mg Tbec (Aspirin) .Marland Kitchen... 1 by mouth qd    Hydrocodone-acetaminophen 5-325 Mg Tabs (Hydrocodone-acetaminophen) .Marland Kitchen... 1-2 by mouth two times a day as needed pain  Problem # 2:  LEG PAIN (ICD-729.5) L schiatica Assessment: New Rx  as above  Complete Medication List: 1)  Vitamin D3 1000 Unit Tabs (Cholecalciferol) .Marland Kitchen.. 1 by mouth daily 2)  Fish Oil 1000 Mg Caps (Omega-3 fatty acids) .... Take 1 tablet by mouth two times a day 3)  Levitra 20 Mg Tabs (Vardenafil hcl) .Marland Kitchen.. 1 once daily as needed 4)  Aspirin 81 Mg Tbec (Aspirin) .Marland Kitchen.. 1 by mouth qd 5)  Prednisone 10 Mg Tabs (Prednisone) .... Take 40mg  qd for 3 days, then 20 mg qd for 3 days, then 10mg  qd for 6 days, then stop. take pc. 6)  Hydrocodone-acetaminophen 5-325 Mg Tabs (Hydrocodone-acetaminophen) .Marland Kitchen.. 1-2 by mouth two times a day as needed pain  Patient Instructions: 1)  Call if  you are not better in a reasonable amount of time or if worse. Go to ER if feeling really bad!  Prescriptions: PREDNISONE 10 MG TABS (PREDNISONE) Take 40mg  qd for 3 days, then 20 mg qd for 3 days, then 10mg  qd for 6 days, then stop. Take pc.  #24 x 1   Entered and Authorized by:   Tresa Garter MD   Signed by:   Tresa Garter MD on 01/31/2010   Method used:   Print then Give to Patient   RxID:   [id number] HYDROCODONE-ACETAMINOPHEN 5-325 MG TABS (HYDROCODONE-ACETAMINOPHEN) 1-2 by mouth two times a day as needed pain  #30 x 0   Entered and Authorized by:   Tresa Garter MD   Signed by:   Tresa Garter MD on 01/31/2010   Method used:   Print then Give to Patient   RxID:   [id number] PREDNISONE 10 MG TABS (PREDNISONE) Take 40mg  qd for 3 days, then 20 mg qd for 3 days, then 10mg  qd for 6 days, then stop. Take pc.  #24 x 1   Entered and Authorized by:   Tresa Garter MD   Signed by:   Tresa Garter MD on 01/31/2010   Method used:   Electronically to        [facility] [address] (retail)       [phone]       [place], [place]  [address]       Ph: [phone]       Fax: [phone]   RxID:   [id number]    Medication Administration  Injection # 1:    Medication: Depo- Medrol 40mg   Orders Added: 1)  Est. Patient Level IV [30160]  Appended Document: severe back pain radiates into left leg/chiropr/?cortisone sh...    Clinical Lists Changes  Orders: Added new Service order of Depo- Medrol 40mg  (J1030) - Signed Added new Service order of Depo- Medrol 80mg  (J1040) - Signed Added new Service order of Admin of Therapeutic Inj  intramuscular or subcutaneous (10932) - Signed       Medication Administration  Injection # 1:    Medication: Depo- Medrol 40mg     Diagnosis: LOW BACK PAIN, ACUTE (ICD-724.2)    Route: IM    Site: LUOQ gluteus    Exp Date: 09/13/2012    Lot #: [id number]    Mfr:  Pharmacia    Comments: pt rec 120mg     Patient tolerated injection without complications    Given by: Lanier Prude, CMA(AAMA) (February 12, 2010 10:58 AM)  Injection # 2:    Medication: Depo- Medrol 80mg     Diagnosis: LOW BACK PAIN, ACUTE (ICD-724.2)    Comments: same as above  Patient tolerated injection without complications    Given by: Lanier Prude, [facility]) (February 12, 2010 10:58 AM)  Orders Added: 1)  Depo- Medrol 40mg  [J1030] 2)  Depo- Medrol 80mg  [J1040] 3)  Admin of Therapeutic Inj  intramuscular or subcutaneous [16109]

## 2010-07-17 NOTE — Progress Notes (Signed)
----   Converted from flag ---- ---- 07/05/2009 10:22 AM, Edman Circle wrote: appt 1/26 @ 4:00  ---- 07/04/2009 9:40 AM, Dagoberto Reef wrote: Thanks  ---- 07/04/2009 9:32 AM, Georgina Quint Plotnikov MD wrote: The following orders have been entered for this patient and placed on Admin Hold:  Type:     Referral       Code:   Doppler Description:   Doppler Referral Order Date:   07/04/2009   Authorized By:   Tresa Garter MD Order #:   [id number] Clinical Notes:   Carot Doppler US f/u Dx stenosis ------------------------------

## 2010-08-14 ENCOUNTER — Encounter: Payer: Self-pay | Admitting: Internal Medicine

## 2010-08-20 ENCOUNTER — Other Ambulatory Visit: Payer: BLUE CROSS/BLUE SHIELD

## 2010-08-20 ENCOUNTER — Encounter (INDEPENDENT_AMBULATORY_CARE_PROVIDER_SITE_OTHER): Payer: Self-pay | Admitting: *Deleted

## 2010-08-20 ENCOUNTER — Other Ambulatory Visit: Payer: Self-pay | Admitting: Internal Medicine

## 2010-08-20 DIAGNOSIS — Z0389 Encounter for observation for other suspected diseases and conditions ruled out: Secondary | ICD-10-CM

## 2010-08-20 DIAGNOSIS — Z0000 Encounter for general adult medical examination without abnormal findings: Secondary | ICD-10-CM

## 2010-08-20 LAB — CBC WITH DIFFERENTIAL/PLATELET
Basophils Absolute: 0 10*3/uL (ref 0.0–0.1)
Basophils Relative: 0.5 % (ref 0.0–3.0)
Eosinophils Absolute: 0.1 10*3/uL (ref 0.0–0.7)
Eosinophils Relative: 2.4 % (ref 0.0–5.0)
HCT: 46.3 % (ref 39.0–52.0)
Hemoglobin: 15.9 g/dL (ref 13.0–17.0)
Lymphocytes Relative: 33.8 % (ref 12.0–46.0)
Lymphs Abs: 2.1 10*3/uL (ref 0.7–4.0)
MCHC: 34.3 g/dL (ref 30.0–36.0)
MCV: 86.5 fl (ref 78.0–100.0)
Monocytes Absolute: 0.7 10*3/uL (ref 0.1–1.0)
Monocytes Relative: 11.5 % (ref 3.0–12.0)
Neutro Abs: 3.2 10*3/uL (ref 1.4–7.7)
Neutrophils Relative %: 51.8 % (ref 43.0–77.0)
Platelets: 196 10*3/uL (ref 150.0–400.0)
RBC: 5.35 Mil/uL (ref 4.22–5.81)
RDW: 14.1 % (ref 11.5–14.6)
WBC: 6.3 10*3/uL (ref 4.5–10.5)

## 2010-08-20 LAB — URINALYSIS
Bilirubin Urine: NEGATIVE
Hgb urine dipstick: NEGATIVE
Ketones, ur: NEGATIVE
Leukocytes, UA: NEGATIVE
Nitrite: NEGATIVE
Specific Gravity, Urine: 1.015 (ref 1.000–1.030)
Total Protein, Urine: NEGATIVE
Urine Glucose: NEGATIVE
Urobilinogen, UA: 0.2 (ref 0.0–1.0)
pH: 6 (ref 5.0–8.0)

## 2010-08-20 LAB — BASIC METABOLIC PANEL
BUN: 21 mg/dL (ref 6–23)
CO2: 28 mEq/L (ref 19–32)
Calcium: 9.3 mg/dL (ref 8.4–10.5)
Chloride: 103 mEq/L (ref 96–112)
Creatinine, Ser: 1 mg/dL (ref 0.4–1.5)
GFR: 76.42 mL/min (ref >60.00)
Glucose, Bld: 114 mg/dL — ABNORMAL HIGH (ref 70–99)
Potassium: 5 mEq/L (ref 3.5–5.1)
Sodium: 140 mEq/L (ref 135–145)

## 2010-08-20 LAB — TSH: TSH: 1.31 u[IU]/mL (ref 0.35–5.50)

## 2010-08-20 LAB — HEPATIC FUNCTION PANEL
ALT: 33 U/L (ref 0–53)
AST: 32 U/L (ref 0–37)
Albumin: 4.1 g/dL (ref 3.5–5.2)
Alkaline Phosphatase: 70 U/L (ref 39–117)
Bilirubin, Direct: 0.2 mg/dL (ref 0.0–0.3)
Total Bilirubin: 1.3 mg/dL — ABNORMAL HIGH (ref 0.3–1.2)
Total Protein: 6.7 g/dL (ref 6.0–8.3)

## 2010-08-20 LAB — LIPID PANEL
Cholesterol: 192 mg/dL (ref 0–200)
HDL: 30.3 mg/dL — ABNORMAL LOW (ref >39.00)
LDL Cholesterol: 136 mg/dL — ABNORMAL HIGH (ref 0–99)
Total CHOL/HDL Ratio: 6
Triglycerides: 131 mg/dL (ref 0.0–149.0)
VLDL: 26.2 mg/dL (ref 0.0–40.0)

## 2010-08-20 LAB — PSA: PSA: 0.39 ng/mL (ref 0.10–4.00)

## 2010-08-27 ENCOUNTER — Ambulatory Visit (INDEPENDENT_AMBULATORY_CARE_PROVIDER_SITE_OTHER)
Admission: RE | Admit: 2010-08-27 | Discharge: 2010-08-27 | Disposition: A | Discharge status: Home / Self Care | Payer: Medicare Other | Source: Ambulatory Visit | Attending: Internal Medicine | Admitting: Internal Medicine

## 2010-08-27 ENCOUNTER — Encounter: Payer: Self-pay | Admitting: Internal Medicine

## 2010-08-27 ENCOUNTER — Other Ambulatory Visit: Payer: Self-pay | Admitting: Internal Medicine

## 2010-08-27 ENCOUNTER — Ambulatory Visit (INDEPENDENT_AMBULATORY_CARE_PROVIDER_SITE_OTHER): Payer: Medicare Other | Admitting: Internal Medicine

## 2010-08-27 DIAGNOSIS — M545 Low back pain, unspecified: Secondary | ICD-10-CM

## 2010-08-27 DIAGNOSIS — N476 Balanoposthitis: Secondary | ICD-10-CM

## 2010-08-27 DIAGNOSIS — L259 Unspecified contact dermatitis, unspecified cause: Secondary | ICD-10-CM | POA: Insufficient documentation

## 2010-08-27 DIAGNOSIS — R209 Unspecified disturbances of skin sensation: Secondary | ICD-10-CM

## 2010-08-27 DIAGNOSIS — M79609 Pain in unspecified limb: Secondary | ICD-10-CM

## 2010-08-27 DIAGNOSIS — Z0000 Encounter for general adult medical examination without abnormal findings: Secondary | ICD-10-CM

## 2010-08-27 DIAGNOSIS — B351 Tinea unguium: Secondary | ICD-10-CM | POA: Insufficient documentation

## 2010-09-02 NOTE — Letter (Signed)
Summary: [facility]   Imported By: Sherian Rein 08/27/2010 09:05:29  _____________________________________________________________________  External Attachment:    Type:   Image     Comment:   External Document

## 2010-09-02 NOTE — Assessment & Plan Note (Signed)
Summary: annual wellness/bcbs,blue medicare   Vital Signs:  Patient profile:   70 year old male Height:      74 inches Weight:      218 pounds BMI:     28.09 Temp:     98.4 degrees F oral Pulse rate:   68 / minute Pulse rhythm:   regular Resp:     16 per minute BP sitting:   140 / 80  (left arm) Cuff size:   regular  Vitals Entered By: Lanier Prude, Beverly Gust) (August 27, 2010 2:02 PM) CC: MWV Is Patient Diabetic? No Comments pt is not taking Prednisone or Hydroco/APAP   CC:  MWV.  History of Present Illness: The patient presents for a preventive health examination  Patient past medical history, social history, and family history reviewed in detail no significant changes.  Patient is physically active. Depression is negative and mood is good. Hearing is normal, and able to perform activities of daily living. Risk of falling is negligible and home safety has been reviewed and is appropriate. Patient has normal height, weight, and visual acuity. Patient has been counseled on age-appropriate routine health concerns for screening and prevention. Education, counseling done.   C/o pain in R buttock down R post thigh and down to leg, R foot gets numb. No injury.  Preventive Screening-Counseling & Management  Alcohol-Tobacco     Alcohol drinks/day: <1     Alcohol type: wine     Smoking Status: quit  Caffeine-Diet-Exercise     Caffeine use/day: 0     Diet Counseling: not indicated; diet is assessed to be healthy     Does Patient Exercise: yes     Type of exercise: tennis,bicycling,golf, [employer]     Times/week: 7     Depression Counseling: not indicated; screening negative for depression  Hep-HIV-STD-Contraception     Hepatitis Risk: no risk noted     Dental Visit-last 6 months yes     TSE monthly: no     Sun Exposure-Excessive: yes  Safety-Violence-Falls     Seat Belt Use: yes     Helmet Use: yes     Firearms in the Home: firearms in the home     Smoke Detectors: yes  Violence in the Home: no risk noted     Sexual Abuse: no     Fall Risk: no      Sexual History:  currently monogamous.    Current Medications (verified): 1)  Vitamin D3 1000 Unit  Tabs (Cholecalciferol) .Marland Kitchen.. 1 By Mouth Daily 2)  Fish Oil 1000 Mg Caps (Omega-3 Fatty Acids) .... Take 1 Tablet By Mouth Two Times A Day 3)  Levitra 20 Mg  Tabs (Vardenafil Hcl) .Marland Kitchen.. 1 Once Daily As Needed 4)  Aspirin 81 Mg Tbec (Aspirin) .Marland Kitchen.. 1 By Mouth Qd 5)  Prednisone 10 Mg Tabs (Prednisone) .... Take 40mg  Qd For 3 Days, Then 20 Mg Qd For 3 Days, Then 10mg  Qd For 6 Days, Then Stop. Take Pc. 6)  Hydrocodone-Acetaminophen 5-325 Mg Tabs (Hydrocodone-Acetaminophen) .Marland Kitchen.. 1-2 By Mouth Two Times A Day As Needed Pain  Allergies (verified): 1)  ! Pcn 2)  ! Sulfa 3)  ! Ibuprofen  Past History:  Past Medical History: Last updated: 07/04/2009 Peripheral vascular disease up to 59% RICA stenosis (refusing statins) Elev. glu GI Dr Laural Benes 2005  Family History: Last updated: 08/23/2007  F COPD  Social History: Last updated: 08/23/2007 Retired CFO Married Regular exercise-yes: tennis, work outs  Past Surgical History: Cervical fusion C5,6,7  2000 Dr Channing Mutters  Social History: Caffeine use/day:  0 Dental Care w/in 6 mos.:  yes Sun Exposure-Excessive:  yes Seat Belt Use:  yes Fall Risk:  no Hepatitis Risk:  no risk noted Sexual History:  currently monogamous  Physical Exam  General:  Well-developed,well-nourished,in no acute distress; alert,appropriate and cooperative throughout examination Head:  Normocephalic and atraumatic without obvious abnormalities. No apparent alopecia or balding. Eyes:  No corneal or conjunctival inflammation noted. EOMI. Perrla.  Ears:  External ear exam shows no significant lesions or deformities.  Otoscopic examination reveals clear canals, tympanic membranes are intact bilaterally without bulging, retraction, inflammation or discharge. Hearing is grossly normal  bilaterally. Nose:  External nasal examination shows no deformity or inflammation. Nasal mucosa are pink and moist without lesions or exudates. Mouth:  Oral mucosa and oropharynx without lesions or exudates.  Teeth in good repair. Neck:  No deformities, masses, or tenderness noted. Lungs:  Normal respiratory effort, chest expands symmetrically. Lungs are clear to auscultation, no crackles or wheezes. Heart:  Normal rate and regular rhythm. S1 and S2 normal without gallop, murmur, click, rub or other extra sounds. Abdomen:  Bowel sounds positive,abdomen soft and non-tender without masses, organomegaly or hernias noted. Rectal:  No external abnormalities noted. Normal sphincter tone. No rectal masses or tenderness. Genitalia:  Rash patch on glans penis Prostate:  Prostate gland firm and smooth, no enlargement, nodularity, tenderness, mass, asymmetry or induration. Msk:  No deformity or scoliosis noted of thoracic or lumbar spine.  Lumbar-sacral spine is tender to palpation over paraspinal muscles and painfull with the ROM  Pulses:  R and L carotid,radial,femoral,dorsalis pedis and posterior tibial pulses are full and equal bilaterally Extremities:  No clubbing, cyanosis, edema, or deformity noted with normal full range of motion of all joints.   Neurologic:  R strait leg elev is (=/-) MS OK DTRs OK Skin:  Intact without suspicious lesions or rashes Cervical Nodes:  No lymphadenopathy noted Psych:  Cognition and judgment appear intact. Alert and cooperative with normal attention span and concentration. No apparent delusions, illusions, hallucinations   Impression & Recommendations:  Problem # 1:  HEALTH MAINTENANCE EXAM (ICD-V70.0) Assessment New  Overall doing well, age appropriate education and counseling updated and referral for appropriate preventive services done unless declined, immunizations up to date or declined, diet counseling done if overweight, urged to quit smoking if smokes,  most recent labs reviewed and current ordered if appropriate, ecg reviewed or declined (interpretation per ECG scanned in the EMR if done); information regarding Medicare Preventation requirements given if appropriate.  I have personally reviewed the Medicare Annual Wellness questionnaire and have noted 1.   The patient's medical and social history 2.   Their use of alcohol, tobacco or illicit drugs 3.   Their current medications and supplements 4.   The patient's functional ability including ADL's, fall risks, home safety risks and hearing or visual             impairment. 5.   Diet and physical activities 6.   Evidence for depression or mood disorders  The patients weight, height, BMI and visual acuity have been recorded in the chart I have made referrals, counseling and provided education to the patient based review of the above and I have provided the pt with a written personalized care plan for preventive services.  Orders: Medicare -1st Annual Wellness Visit [phone])  Problem # 2:  LOW BACK PAIN, ACUTE (ICD-724.2) Assessment: New  The following medications were removed from the  medication list:    Hydrocodone-acetaminophen 5-325 Mg Tabs (Hydrocodone-acetaminophen) .Marland Kitchen... 1-2 by mouth two times a day as needed pain His updated medication list for this problem includes:    Aspirin 81 Mg Tbec (Aspirin) .Marland Kitchen... 1 by mouth qd  Orders: T-Lumbar Spine Complete, 5 Views (71110TC)  Problem # 3:  LEG PAIN (ICD-729.5) R - radiculopathic Assessment: New Predn Take 40mg  qd for 3 days, then 20 mg qd for 3 days, then 10mg  qd for 6 days, then stop. Take pc.   Problem # 4:  PARESTHESIA (ICD-782.0) R Assessment: New  Problem # 5:  BALANITIS (ICD-607.1) Assessment: New On the regimen of medicine(s) reflected in the chart    Problem # 6:  ONYCHOMYCOSIS (ICD-110.1)B     Penlac 8 % Soln (Ciclopirox) ..... Use once daily on affected nail(s). once a week remove the build-up with an alcohol  swab  Complete Medication List: 1)  Vitamin D3 1000 Unit Tabs (Cholecalciferol) .Marland Kitchen.. 1 by mouth daily 2)  Fish Oil 1000 Mg Caps (Omega-3 fatty acids) .... Take 1 tablet by mouth two times a day 3)  Levitra 20 Mg Tabs (Vardenafil hcl) .Marland Kitchen.. 1 once daily as needed 4)  Aspirin 81 Mg Tbec (Aspirin) .Marland Kitchen.. 1 by mouth qd 5)  Prednisone 10 Mg Tabs (Prednisone) .... Take 40mg  qd for 3 days, then 20 mg qd for 3 days, then 10mg  qd for 6 days, then stop. take pc. 6)  Ketoconazole 2 % Crea (Ketoconazole) .... Use two times a day x 1 month 7)  Penlac 8 % Soln (Ciclopirox) .... Use once daily on affected nail(s). once a week remove the build-up with an alcohol swab 8)  Lipitor 10 Mg Tabs (Atorvastatin calcium) .Marland Kitchen.. 1 by mouth once daily for cholesterol  Patient Instructions: 1)  Please schedule a follow-up appointment in 1 month. 2)  Call if you are not better in a reasonable amount of time or if worse.  Prescriptions: LIPITOR 10 MG TABS (ATORVASTATIN CALCIUM) 1 by mouth once daily for cholesterol  #30 x 12   Entered and Authorized by:   Tresa Garter MD   Signed by:   Tresa Garter MD on 08/27/2010   Method used:   Print then Give to Patient   RxID:   [id number] LEVITRA 20 MG  TABS (VARDENAFIL HCL) 1 once daily as needed  #36 x 3   Entered and Authorized by:   Tresa Garter MD   Signed by:   Tresa Garter MD on 08/27/2010   Method used:   Print then Give to Patient   RxID:   [id number] PREDNISONE 10 MG TABS (PREDNISONE) Take 40mg  qd for 3 days, then 20 mg qd for 3 days, then 10mg  qd for 6 days, then stop. Take pc.  #24 x 1   Entered and Authorized by:   Tresa Garter MD   Signed by:   Tresa Garter MD on 08/27/2010   Method used:   Print then Give to Patient   RxID:   [id number] PENLAC 8 % SOLN (CICLOPIROX) Use once daily on affected nail(s). Once a week remove the build-up with an alcohol swab  #1 x 2   Entered and Authorized by:   Tresa Garter MD   Signed by:   Tresa Garter MD on 08/27/2010   Method used:   Print then Give to Patient   RxID:   [id number] KETOCONAZOLE 2 % CREA (KETOCONAZOLE) use two times a day x  1 month  #30 g x 1   Entered and Authorized by:   Tresa Garter MD   Signed by:   Tresa Garter MD on 08/27/2010   Method used:   Print then Give to Patient   RxID:   [id number]    Orders Added: 1)  T-Lumbar Spine Complete, 5 Views [71110TC] 2)  Medicare -1st Annual Wellness Visit [G0438] 3)  Est. Patient Level IV [14782]

## 2010-10-20 ENCOUNTER — Ambulatory Visit (INDEPENDENT_AMBULATORY_CARE_PROVIDER_SITE_OTHER)
Admission: RE | Admit: 2010-10-20 | Discharge: 2010-10-20 | Disposition: A | Discharge status: Home / Self Care | Payer: Medicare Other | Source: Ambulatory Visit | Attending: Internal Medicine | Admitting: Internal Medicine

## 2010-10-20 ENCOUNTER — Encounter: Payer: Self-pay | Admitting: Internal Medicine

## 2010-10-20 ENCOUNTER — Ambulatory Visit (INDEPENDENT_AMBULATORY_CARE_PROVIDER_SITE_OTHER): Payer: Medicare Other | Admitting: Internal Medicine

## 2010-10-20 DIAGNOSIS — I6529 Occlusion and stenosis of unspecified carotid artery: Secondary | ICD-10-CM

## 2010-10-20 DIAGNOSIS — M79609 Pain in unspecified limb: Secondary | ICD-10-CM

## 2010-10-20 DIAGNOSIS — N476 Balanoposthitis: Secondary | ICD-10-CM

## 2010-10-20 DIAGNOSIS — M545 Low back pain, unspecified: Secondary | ICD-10-CM

## 2010-10-20 MED ORDER — NABUMETONE 750 MG PO TABS: 750.0000 mg | ORAL_TABLET | Freq: Two times a day (BID) | ORAL | Status: DC

## 2010-10-20 MED ORDER — LOVASTATIN 20 MG PO TABS: 20.0000 mg | ORAL_TABLET | Freq: Every day | ORAL | Status: DC

## 2010-10-20 NOTE — Progress Notes (Signed)
  Subjective:    Patient ID: Francis Sims, male    DOB: 1941-03-10, 70 y.o.   MRN: [id number]  HPI C/o pain in L buttock worse w/sitting x 4 wks - pain is 7/10 relieved w/aleve. He sprained  His hamstring pulled while plaing tennis   Review of Systems  Constitutional: Negative for fever, chills and fatigue.  HENT: Negative for mouth sores.   Respiratory: Negative for chest tightness.   Cardiovascular: Negative for leg swelling.  Gastrointestinal: Negative for abdominal pain and abdominal distention.  Genitourinary: Positive for genital sores (glans penis lesion). Negative for flank pain.  Musculoskeletal: Positive for arthralgias (tender buttock) and gait problem. Negative for back pain.  Psychiatric/Behavioral: The patient is not nervous/anxious.        Objective:   Physical Exam  Constitutional: He is oriented to person, place, and time. He appears well-developed.  HENT:  Mouth/Throat: Oropharynx is clear and moist.  Eyes: Conjunctivae are normal. Pupils are equal, round, and reactive to light.  Neck: Normal range of motion. No JVD present. No thyromegaly present.  Cardiovascular: Normal rate, regular rhythm, normal heart sounds and intact distal pulses.  Exam reveals no gallop and no friction rub.   No murmur heard. Pulmonary/Chest: Effort normal and breath sounds normal. No respiratory distress. He has no wheezes. He has no rales. He exhibits no tenderness.  Abdominal: Soft. Bowel sounds are normal. He exhibits no distension and no mass. There is no tenderness. There is no rebound and no guarding.  Genitourinary: No penile tenderness.       anter penile lesion on glans penis  Musculoskeletal: Normal range of motion. He exhibits tenderness (L ischial bone is very tender). He exhibits no edema.  Lymphadenopathy:    He has no cervical adenopathy.  Neurological: He is alert and oriented to person, place, and time. He has normal reflexes. No cranial nerve deficit. He exhibits  normal muscle tone. Coordination normal.  Skin: Skin is warm and dry. No rash noted.  Psychiatric: He has a normal mood and affect. His behavior is normal. Judgment and thought content normal.          Assessment & Plan:  LOW BACK PAIN, ACUTE Resolved  X ray IMPRESSION:  1. Lower lumbar spondylosis with grade 1 anterolisthesis of L4 on  L5, likely secondary to degenerative facet arthrosis.  2. Thoracolumbar transitional anatomy.  Original Report Authenticated By: Andreas Newport, M.D.   LEG PAIN R leg pain is better New L buttock pain - MSK   BALANITIS Worse - penile lesion. Urol. Consult.  CAROTID ARTERY STENOSIS He will try Lovastatin

## 2010-10-20 NOTE — Assessment & Plan Note (Signed)
R leg pain is better New L buttock pain - MSK

## 2010-10-20 NOTE — Assessment & Plan Note (Signed)
He will try Lovastatin

## 2010-10-20 NOTE — Assessment & Plan Note (Signed)
Resolved  X ray IMPRESSION:  1. Lower lumbar spondylosis with grade 1 anterolisthesis of L4 on  L5, likely secondary to degenerative facet arthrosis.  2. Thoracolumbar transitional anatomy.  Original Report Authenticated By: Andreas Newport, M.D.

## 2010-10-20 NOTE — Assessment & Plan Note (Signed)
Worse - penile lesion. Urol. Consult.

## 2010-10-20 NOTE — Patient Instructions (Signed)
Stretch hips - check [email or web address]

## 2010-10-21 ENCOUNTER — Telehealth: Payer: Self-pay | Admitting: Internal Medicine

## 2010-10-21 NOTE — Telephone Encounter (Signed)
Misty Stanley, please, inform patient that his pelvis xray was ok Thx

## 2010-10-22 NOTE — Telephone Encounter (Signed)
Pt informed

## 2010-10-28 NOTE — Assessment & Plan Note (Signed)
[facility]                           PRIMARY CARE OFFICE NOTE   NAME:Courtney, VERDUN RACKLEY                     MRN:          [id number]  DATE:11/16/2006                            DOB:          [birth year]    The patient is a 70 year old male who presents for a wellness  examination. Past medical history, family history, and social history is  unchanged from previous documentation on the chart. Father died in his  70s from complications of chronic steroid intake for asthma and chronic  obstructive pulmonary disease. Mother is 86 years old, broke her hip two  weeks ago, otherwise healthy. The patient is retired. He stays very  active, plays a lot of tennis and golf.   CURRENT MEDICATIONS:  None, except for multiple nutritional supplements.   ALLERGIES:  SULFA, PENICILLIN.   REVIEW OF SYSTEMS:  No chest pain or shortness of breath, no syncope, no  neurologic complaints. The rest of the 18 point review of systems is  negative.   PHYSICAL EXAMINATION:  VITAL SIGNS:  Blood pressure 131/72, pulse 60,  temperature 97.8, weight 213 pounds.  GENERAL:  He looks well. He is no acute distress.  HEENT:  Moist mucosa.  NECK:  Supple. No thyromegaly or bruit.  LUNGS:  Clear to auscultation and percussion. No wheeze or rales.  HEART:  S1, S2, no murmur, no gallop.  ABDOMEN:  Soft and nontender, no organomegaly, no mass felt.  LOWER EXTREMITIES:  Without edema.  NEUROLOGIC:  He is alert and cooperative. Denies being depressed.  GENITALIA:  Normal external genitalia testicles without masses  RECTAL:  Reveals normal prostate, no nodules, no masses. Stool is guaiac  negative.   LABORATORY DATA:  Labs on 11/15/06: CBC normal, glucose 113, total  bilirubin 1.5, LFTs normal, cholesterol 209, HDL 34, LDL 115, TSH  normal, urinalysis normal.   ASSESSMENT AND PLAN:  1. Normal wellness examination.  Age-related health issues discussed,      lifestyle discussed. I asked him  to discontinue vitamin E.      Information about Zostavax was provided. Colonoscopy was done 3 to      4 years ago by Dr. Laural Benes, he will be recalled by him for repeat      procedure. He had chest x-ray last Monday at urgent care when he      had bronchitis.  2. Elevated glucose. Obtain A1C in about six months.  3. Possible left sided carotid bruit. Obtain carotid Doppler      ultrasound.    Georgina Quint. Plotnikov, MD  Electronically Signed   AVP/MedQ  DD: 11/21/2006  DT: 11/22/2006  Job #: [id number]

## 2010-10-31 NOTE — Assessment & Plan Note (Signed)
[facility]                             PRIMARY CARE OFFICE NOTE   NAME:Gruner, Janssen                        MRN:          [id number]  DATE:  12/28/2005                              DOB:      1940/12/23    CHIEF COMPLAINT:  New patient to practice.   HISTORY OF PRESENT ILLNESS:  Patient is a 70 year old white male here to  establish primary care.  Patient states that his previous physicians at the  [facility] have retired.  He has been fairly healthy for most of his life.  He is  status post neck surgery and hernia repair.  Neck surgery was in 2000.  Hernia repair was in 1949.  He has a history of rheumatic fever but has no  issues with valvulopathy.  He is mainly here for a routine physical which he  has not had in the past two years.  He denies any history of heart disease  or diabetes.  Has not been told that he has had high blood pressure readings  in the past.   CURRENT MEDICATIONS:  1.  Multiple supplements.  2.  Vitamin C 1000 mg.  3.  Vitamin E 400 international units once a day.  4.  Vitamin D unknown dose.  5.  Magnesium unknown dose.  6.  Saw palmetto unknown dose.  7.  Fish oil supplementation once a day.  8.  Multivitamin one a day.  9.  Probiotic one a day.  10. GastroMycin one a day.  11. He also takes Levitra p.r.n.   ALLERGIES TO MEDICATIONS:  SULFA, which caused hives, and also PENICILLIN,  which caused hives.   SOCIAL HISTORY:  Patient is married.  His wife is also a patient of ours,  Lilton Pare.  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   FAMILY HISTORY:  Mother alive at age 53, has a pacemaker.  Father deceased  at age 35, presumed secondary to natural causes and there is no report of  cancer history in the family.   REVIEW OF SYSTEMS:  Patient denies any fevers, chills, no HEENT symptoms.  Patient denies any chest pain or palpitations.  He does exercise on a  regular basis.  He goes to the gym and also plays tennis two  times per week.  No history of heartburn.  No dark stools or blood in his stool.  He has had  his last colonoscopy in 2004.  He denies any problems with urination  including nocturia, frequent urination.  All other systems negative.   PHYSICAL EXAMINATION:  VITAL SIGNS:  Height is 6 foot 2 inches.  Weight is  210 pounds. Temperature is 99.  Pulse is 65.  Blood pressure with a manual  cuff is 158/80 in the seated position.  GENERAL:  The patient is a very pleasant well developed, well nourished,  physically-fit appearing 70 year old white male.  HEENT:  Normocephalic, atraumatic.  Pupils are equal and reactive to light  bilaterally.  Extraocular motility was intact.  Patient was anicteric.  Conjunctiva was within normal limits.  External auditory canals and tympanic  membranes were clear bilaterally.  Hearing was grossly normal.  NECK:  Supple, no adenopathy, carotid bruit or thyromegaly.  CHEST:  Normal respiratory effort.  Chest is clear to auscultation  bilaterally.  No rhonchi, rales, or wheezing.  CARDIOVASCULAR:  Regular rate and rhythm.  No significant murmurs, rubs or  gallops appreciated.  ABDOMEN:  Soft, non-tender.  Positive bowel sounds.  No organomegaly.  RECTAL:  Deferred.  MUSCULOSKELETAL:  No clubbing, cyanosis or edema.  Skin warm and dry.  Patient had intact pedis dorsalis equal and symmetric.  NEUROLOGIC:  Cranial nerve II through XII grossly intact.  He was nonfocal.   Screening EKG was performed in the office which showed normal sinus rhythm  at 60 beats per minute.  There was Q wave in III, however, it does not  appear to be pathologic.  No other ST changes are noted.  The patient had  normal R wave progression.   IMPRESSIONS/RECOMMENDATIONS:  Routine physical in 70 year old white male.  It has been at least two years since he has had blood tests including a  lipid profile, PSA and a comprehensive metabolic profile.  I am somewhat  concerned about his elevated  blood pressure readings in the office and I  have asked him to keep a log of his blood pressure at least ten readings  over the next two weeks time.  He will followup with me to review labs and  his blood pressure log.  He is to followup with his previous  gastroenterologist at [facility] for repeat colonoscopy as recommended.  Followup  time approximately two to four weeks.                                   Barbette Hair. Artist Pais, DO   RDY/MedQ  DD:  12/29/2005  DT:  12/29/2005  Job #:  [id number]

## 2010-11-13 ENCOUNTER — Telehealth: Payer: Self-pay | Admitting: *Deleted

## 2010-11-13 NOTE — Telephone Encounter (Signed)
Patient requesting a call regarding shingles vaccine.

## 2010-11-17 MED ORDER — ZOSTER VACCINE LIVE 19400 UNT/0.65ML ~~LOC~~ SOLR: 0.6500 mL | Freq: Once | SUBCUTANEOUS | Status: AC

## 2010-11-17 NOTE — Telephone Encounter (Signed)
Left vm for patient

## 2010-11-17 NOTE — Telephone Encounter (Signed)
Needs Rx for shingles vaccine to go to [facility], Done, need to call pt to inform.

## 2011-04-15 ENCOUNTER — Ambulatory Visit (INDEPENDENT_AMBULATORY_CARE_PROVIDER_SITE_OTHER): Payer: Medicare Other | Admitting: Internal Medicine

## 2011-04-15 ENCOUNTER — Encounter: Payer: Self-pay | Admitting: Internal Medicine

## 2011-04-15 VITALS — BP 150/80 | HR 76 | Temp 98.8°F | Resp 16 | Wt 219.0 lb

## 2011-04-15 DIAGNOSIS — IMO0002 Reserved for concepts with insufficient information to code with codable children: Secondary | ICD-10-CM

## 2011-04-15 DIAGNOSIS — M5416 Radiculopathy, lumbar region: Secondary | ICD-10-CM

## 2011-04-15 DIAGNOSIS — M79609 Pain in unspecified limb: Secondary | ICD-10-CM

## 2011-04-15 MED ORDER — TRAMADOL HCL 50 MG PO TABS: 50.0000 mg | ORAL_TABLET | Freq: Two times a day (BID) | ORAL | Status: AC | PRN

## 2011-04-15 MED ORDER — PREDNISONE 10 MG PO TABS: ORAL_TABLET | ORAL | Status: AC

## 2011-04-15 MED ORDER — DIAZEPAM 5 MG PO TABS: 5.0000 mg | ORAL_TABLET | Freq: Four times a day (QID) | ORAL | Status: DC | PRN

## 2011-04-15 NOTE — Progress Notes (Signed)
  Subjective:    Patient ID: Francis Sims, male    DOB: 10-03-1940, 70 y.o.   MRN: [id number]  HPI  C/o recurrent LS, R buttock, thigh x 12+ months, worse now R leg is weak x 2 wks He has to walk bent forward at times  Review of Systems  Constitutional: Negative for appetite change, fatigue and unexpected weight change.  HENT: Negative for nosebleeds, congestion, sore throat, sneezing, trouble swallowing and neck pain.   Eyes: Negative for itching and visual disturbance.  Respiratory: Negative for cough.   Cardiovascular: Negative for chest pain, palpitations and leg swelling.  Gastrointestinal: Negative for nausea, diarrhea, blood in stool and abdominal distention.  Genitourinary: Negative for frequency and hematuria.  Musculoskeletal: Positive for back pain and gait problem. Negative for joint swelling.  Skin: Negative for rash.  Neurological: Negative for dizziness, tremors, speech difficulty and weakness.  Psychiatric/Behavioral: Negative for sleep disturbance, dysphoric mood and agitation. The patient is not nervous/anxious.        Objective:   Physical Exam  Constitutional: He is oriented to person, place, and time. He appears well-developed.  HENT:  Mouth/Throat: Oropharynx is clear and moist.  Eyes: Conjunctivae are normal. Pupils are equal, round, and reactive to light.  Neck: Normal range of motion. No JVD present. No thyromegaly present.  Cardiovascular: Normal rate, regular rhythm, normal heart sounds and intact distal pulses.  Exam reveals no gallop and no friction rub.   No murmur heard. Pulmonary/Chest: Effort normal and breath sounds normal. No respiratory distress. He has no wheezes. He has no rales. He exhibits no tenderness.  Abdominal: Soft. Bowel sounds are normal. He exhibits no distension and no mass. There is no tenderness. There is no rebound and no guarding.  Musculoskeletal: Normal range of motion. He exhibits tenderness. He exhibits no edema.   LS is tender  Lymphadenopathy:    He has no cervical adenopathy.  Neurological: He is alert and oriented to person, place, and time. He has normal reflexes. He displays normal reflexes. No cranial nerve deficit. He exhibits normal muscle tone. Coordination normal.       R LE is a little weaker 5-/5  Skin: Skin is warm and dry. No rash noted.  Psychiatric: He has a normal mood and affect. His behavior is normal. Judgment and thought content normal.          Assessment & Plan:

## 2011-04-16 ENCOUNTER — Other Ambulatory Visit: Payer: Self-pay | Admitting: *Deleted

## 2011-04-16 DIAGNOSIS — M5416 Radiculopathy, lumbar region: Secondary | ICD-10-CM

## 2011-04-19 ENCOUNTER — Encounter: Payer: Self-pay | Admitting: Internal Medicine

## 2011-04-20 NOTE — Assessment & Plan Note (Signed)
R 10/12 -- Valium was given for his MRI pre-med due to claustrophobia

## 2011-04-20 NOTE — Assessment & Plan Note (Signed)
2012 R - severe and recurrent Will sch LS spine MRI See Meds  Potential benefits of a short term steroid and tramadol  use as well as potential risks  and complications were explained to the patient and were aknowledged.

## 2011-04-21 ENCOUNTER — Ambulatory Visit: Payer: Medicare Other

## 2011-04-21 DIAGNOSIS — M5416 Radiculopathy, lumbar region: Secondary | ICD-10-CM

## 2011-04-21 LAB — CREATININE, SERUM: Creatinine, Ser: 1.1 mg/dL (ref 0.4–1.5)

## 2011-04-21 LAB — BUN: BUN: 22 mg/dL (ref 6–23)

## 2011-04-24 ENCOUNTER — Ambulatory Visit (HOSPITAL_COMMUNITY)
Admission: RE | Admit: 2011-04-24 | Discharge: 2011-04-24 | Disposition: A | Discharge status: Home / Self Care | Payer: Medicare Other | Source: Ambulatory Visit | Attending: Internal Medicine | Admitting: Internal Medicine

## 2011-04-24 DIAGNOSIS — IMO0002 Reserved for concepts with insufficient information to code with codable children: Secondary | ICD-10-CM | POA: Insufficient documentation

## 2011-04-24 DIAGNOSIS — M5126 Other intervertebral disc displacement, lumbar region: Secondary | ICD-10-CM | POA: Insufficient documentation

## 2011-04-24 DIAGNOSIS — M545 Low back pain, unspecified: Secondary | ICD-10-CM | POA: Insufficient documentation

## 2011-04-24 DIAGNOSIS — M5416 Radiculopathy, lumbar region: Secondary | ICD-10-CM

## 2011-04-24 DIAGNOSIS — M48061 Spinal stenosis, lumbar region without neurogenic claudication: Secondary | ICD-10-CM | POA: Insufficient documentation

## 2011-04-24 DIAGNOSIS — Q762 Congenital spondylolisthesis: Secondary | ICD-10-CM | POA: Insufficient documentation

## 2011-04-24 DIAGNOSIS — M79609 Pain in unspecified limb: Secondary | ICD-10-CM | POA: Insufficient documentation

## 2011-04-24 DIAGNOSIS — R29898 Other symptoms and signs involving the musculoskeletal system: Secondary | ICD-10-CM | POA: Insufficient documentation

## 2011-04-24 MED ORDER — GADOBENATE DIMEGLUMINE 529 MG/ML IV SOLN
20.0000 mL | Freq: Once | INTRAVENOUS | Status: AC
  Administered 2011-04-24: 20 mL via INTRAVENOUS

## 2011-04-27 ENCOUNTER — Telehealth: Payer: Self-pay | Admitting: Internal Medicine

## 2011-04-27 NOTE — Telephone Encounter (Signed)
Misty Stanley, please, inform patient that there is  a pinched nerve on MRI. Is he better? OV this week Thx

## 2011-04-28 NOTE — Telephone Encounter (Signed)
Pt informed/scheduled for 04-29-11 at 2:45.

## 2011-04-29 ENCOUNTER — Ambulatory Visit (INDEPENDENT_AMBULATORY_CARE_PROVIDER_SITE_OTHER): Payer: Medicare Other | Admitting: Internal Medicine

## 2011-04-29 ENCOUNTER — Encounter: Payer: Self-pay | Admitting: Internal Medicine

## 2011-04-29 VITALS — BP 136/80 | HR 60 | Temp 97.9°F | Ht 74.0 in | Wt 217.0 lb

## 2011-04-29 DIAGNOSIS — M5416 Radiculopathy, lumbar region: Secondary | ICD-10-CM

## 2011-04-29 DIAGNOSIS — M545 Low back pain, unspecified: Secondary | ICD-10-CM

## 2011-04-29 DIAGNOSIS — IMO0002 Reserved for concepts with insufficient information to code with codable children: Secondary | ICD-10-CM

## 2011-04-29 DIAGNOSIS — M79609 Pain in unspecified limb: Secondary | ICD-10-CM

## 2011-04-29 MED ORDER — NABUMETONE 750 MG PO TABS: 750.0000 mg | ORAL_TABLET | Freq: Two times a day (BID) | ORAL | Status: DC

## 2011-04-29 MED ORDER — PREDNISONE 10 MG PO TABS: ORAL_TABLET | ORAL | Status: AC

## 2011-04-29 MED ORDER — TRAMADOL HCL 50 MG PO TABS: 50.0000 mg | ORAL_TABLET | Freq: Two times a day (BID) | ORAL | Status: AC | PRN

## 2011-04-29 MED ORDER — TRAMADOL HCL 50 MG PO TABS: 50.0000 mg | ORAL_TABLET | Freq: Two times a day (BID) | ORAL | Status: DC | PRN

## 2011-04-29 NOTE — Progress Notes (Deleted)
  Subjective:    Patient ID: Francis Sims, male    DOB: 1940/11/17, 70 y.o.   MRN: [id number]  HPI  F/u LBP 3-4/10 on meds and R LE pain 60% better   Review of Systems     Objective:   Physical Exam        Assessment & Plan:

## 2011-05-03 ENCOUNTER — Encounter: Payer: Self-pay | Admitting: Internal Medicine

## 2011-05-03 NOTE — Progress Notes (Signed)
HPI F/u LBP 3-4/10 on meds and R LE pain 60% better  Past Medical History  Diagnosis Date  . PVD (peripheral vascular disease)     up to 59% RICA stenosis (refusing statins)  . Elevated glucose     Current Outpatient Prescriptions  Medication Sig Dispense Refill  . aspirin 81 MG tablet Take 81 mg by mouth daily.        . cholecalciferol (VITAMIN D) 1000 UNITS tablet Take 1,000 Units by mouth daily.        . diazepam (VALIUM) 5 MG tablet Take 1 tablet (5 mg total) by mouth every 6 (six) hours as needed for anxiety or sleep.  30 tablet  0  . nabumetone (RELAFEN) 750 MG tablet Take 1 tablet (750 mg total) by mouth 2 (two) times daily.  60 tablet  2  . Omega-3 Fatty Acids (FISH OIL) 1000 MG CAPS Take 1 capsule by mouth 2 (two) times daily.        . vardenafil (LEVITRA) 20 MG tablet Take 20 mg by mouth daily as needed.        . ciclopirox (PENLAC) 8 % solution Apply topically at bedtime. Apply over nail and surrounding skin. Apply daily over previous coat. After seven (7) days, may remove with alcohol and continue cycle.       . lovastatin (MEVACOR) 20 MG tablet Take 1 tablet (20 mg total) by mouth at bedtime.  30 tablet  11  . predniSONE (DELTASONE) 10 MG tablet Prednisone 10 mg: take 4 tabs a day x 3 days; then 3 tabs a day x 4 days; then 2 tabs a day x 4 days, then 1 tab a day x 6 days, then stop. Take pc.   38 tablet  1  . traMADol (ULTRAM) 50 MG tablet Take 1-2 tablets (50-100 mg total) by mouth 2 (two) times daily as needed for pain. Maximum dose= 8 tablets per day  120 tablet  3    Allergies  Allergen Reactions  . Ibuprofen     REACTION: itching  . Penicillins   . Sulfonamide Derivatives     Family History  Problem Relation Age of Onset  . COPD Other     History   Social History  . Marital Status: Married    Spouse Name: N/A    Number of Children: N/A  . Years of Education: N/A   Occupational History  . retired [employer]    Social History Main Topics  . Smoking status:  Former Games developer  . Smokeless tobacco: Not on file  . Alcohol Use: Not on file  . Drug Use: Not on file  . Sexually Active: Not on file   Other Topics Concern  . Not on file   Social History Narrative   Regular  Exercise-yes: tennis, work outs    ROS ALL NEGATIVE EXCEPT THOSE NOTED IN HPI  PE  General Appearance: well developed, well nourished in no acute distress HEENT: symmetrical face, PERRLA, good dentition  Neck: no JVD, thyromegaly, or adenopathy, trachea midline Chest: symmetric without deformity Cardiac: PMI non-displaced, RRR, normal S1, S2, no gallop or murmur Lung: clear to ausculation and percussion Vascular: all pulses full without bruits  Abdominal: nondistended, nontender, good bowel sounds, no HSM, no bruits Extremities: no cyanosis, clubbing or edema, no sign of DVT, no varicosities  Skin: normal color, no rashes Neuro: alert and oriented x 3, RLE is weaker; LS is tender Pysch: normal affect   BMET    Component Value  Date/Time   NA 140 08/20/2010 0920   K 5.0 08/20/2010 0920   CL 103 08/20/2010 0920   CO2 28 08/20/2010 0920   GLUCOSE 114* 08/20/2010 0920   BUN 22 04/21/2011 0826   CREATININE 1.1 04/21/2011 0826   CALCIUM 9.3 08/20/2010 0920   GFRNONAA 71.07 07/04/2009 0000   GFRAA 86 04/24/2008 1127    Lipid Panel     Component Value Date/Time   CHOL 192 08/20/2010 0920   TRIG 131.0 08/20/2010 0920   HDL 30.30* 08/20/2010 0920   CHOLHDL 6 08/20/2010 0920   VLDL 26.2 08/20/2010 0920   LDLCALC 136* 08/20/2010 0920    CBC    Component Value Date/Time   WBC 6.3 08/20/2010 0920   RBC 5.35 08/20/2010 0920   HGB 15.9 08/20/2010 0920   HCT 46.3 08/20/2010 0920   PLT 196.0 08/20/2010 0920   MCV 86.5 08/20/2010 0920   MCHC 34.3 08/20/2010 0920   RDW 14.1 08/20/2010 0920   LYMPHSABS 2.1 08/20/2010 0920   MONOABS 0.7 08/20/2010 0920   EOSABS 0.1 08/20/2010 0920   BASOSABS 0.0 08/20/2010 0920

## 2011-05-03 NOTE — Assessment & Plan Note (Signed)
2012 R - severe 04/24/11 MRI:  IMPRESSION:  1. Large central disc protrusion at L3-4 severely compressing the  thecal sac. Combined with hypertrophy of the left ligamentum  flavum, the thecal sac is severely compressed as are the lateral  recesses.  2. Severe spinal stenosis secondary to a grade 1 spondylolisthesis  and marked hypertrophy of the facet joints and ligamenta flava with  severe compression of both lateral recesses.  Original Report Authenticated By: Gwynn Burly, M.D.  Appt w/Dr Gerlene Fee is pending Continue with current prescription therapy as reflected on the Med list.

## 2011-05-03 NOTE — Assessment & Plan Note (Signed)
2012 R - severe 04/24/11 MRI:  IMPRESSION:  1. Large central disc protrusion at L3-4 severely compressing the  thecal sac. Combined with hypertrophy of the left ligamentum  flavum, the thecal sac is severely compressed as are the lateral  recesses.  2. Severe spinal stenosis secondary to a grade 1 spondylolisthesis  and marked hypertrophy of the facet joints and ligamenta flava with  severe compression of both lateral recesses.  Original Report Authenticated By: Gwynn Burly, M.D.   NS cons Dr Gerlene Fee Cont Meds

## 2011-05-13 ENCOUNTER — Telehealth: Payer: Self-pay | Admitting: *Deleted

## 2011-05-13 NOTE — Telephone Encounter (Signed)
LMOM for call back to inform us of any notes, reports, results that they may need faxed over before Pt's OV on Monday 12.03.12 [patient told he needed to bring "actual study"?]

## 2011-05-19 ENCOUNTER — Encounter (HOSPITAL_COMMUNITY): Payer: Self-pay | Admitting: Pharmacy Technician

## 2011-05-22 ENCOUNTER — Encounter (HOSPITAL_COMMUNITY): Payer: Self-pay

## 2011-05-22 ENCOUNTER — Encounter (HOSPITAL_COMMUNITY)
Admission: RE | Admit: 2011-05-22 | Discharge: 2011-05-22 | Disposition: A | Discharge status: Home / Self Care | Payer: Medicare Other | Source: Ambulatory Visit | Attending: Neurosurgery | Admitting: Neurosurgery

## 2011-05-22 HISTORY — DX: Eructation: R14.2

## 2011-05-22 HISTORY — DX: Personal history of colon polyps, unspecified: Z86.0100

## 2011-05-22 HISTORY — DX: Other chronic pain: G89.29

## 2011-05-22 HISTORY — DX: Personal history of colonic polyps: Z86.010

## 2011-05-22 HISTORY — DX: Nocturia: R35.1

## 2011-05-22 HISTORY — DX: Dorsalgia, unspecified: M54.9

## 2011-05-22 HISTORY — DX: Xerosis cutis: L85.3

## 2011-05-22 LAB — BASIC METABOLIC PANEL
BUN: 17 mg/dL (ref 6–23)
CO2: 28 mEq/L (ref 19–32)
Calcium: 9.4 mg/dL (ref 8.4–10.5)
Chloride: 102 mEq/L (ref 96–112)
Creatinine, Ser: 1.03 mg/dL (ref 0.50–1.35)
GFR calc Af Amer: 84 mL/min — ABNORMAL LOW (ref >90)
GFR calc non Af Amer: 73 mL/min — ABNORMAL LOW (ref >90)
Glucose, Bld: 140 mg/dL — ABNORMAL HIGH (ref 70–99)
Potassium: 5 mEq/L (ref 3.5–5.1)
Sodium: 138 mEq/L (ref 135–145)

## 2011-05-22 LAB — CBC
HCT: 47.6 % (ref 39.0–52.0)
Hemoglobin: 16.7 g/dL (ref 13.0–17.0)
MCH: 29.7 pg (ref 26.0–34.0)
MCHC: 35.1 g/dL (ref 30.0–36.0)
MCV: 84.5 fL (ref 78.0–100.0)
Platelets: 223 10*3/uL (ref 150–400)
RBC: 5.63 MIL/uL (ref 4.22–5.81)
RDW: 13.6 % (ref 11.5–15.5)
WBC: 7.2 10*3/uL (ref 4.0–10.5)

## 2011-05-22 LAB — MRSA PCR SCREENING: MRSA by PCR: NEGATIVE

## 2011-05-22 NOTE — Pre-Procedure Instructions (Signed)
20 Francis Sims  05/22/2011   Your procedure is scheduled on:  Tues,Dec 11th @ 1030  Report to [facility] at 0830 AM.  Call this number if you have problems the morning of surgery: [phone]   Remember:   Do not eat food:After Midnight.  May have clear liquids: up to 4 Hours before arrival.  Clear liquids include soda, tea, black coffee, apple or grape juice, broth.  Take these medicines the morning of surgery with A SIP OF WATER: Tramadol   Do not wear jewelry, make-up or nail polish.  Do not wear lotions, powders, or perfumes. You may wear deodorant.  Do not shave 48 hours prior to surgery.  Do not bring valuables to the hospital.  Contacts, dentures or bridgework may not be worn into surgery.  Leave suitcase in the car. After surgery it may be brought to your room.  For patients admitted to the hospital, checkout time is 11:00 AM the day of discharge.   Patients discharged the day of surgery will not be allowed to drive home.  Name and phone number of your driver:   Special Instructions: CHG Shower Use Special Wash: 1/2 bottle night before surgery and 1/2 bottle morning of surgery.   Please read over the following fact sheets that you were given: Pain Booklet, Coughing and Deep Breathing, MRSA Information and Surgical Site Infection Prevention

## 2011-05-22 NOTE — Progress Notes (Signed)
Blood pressure was elevated at PAT appointment;talked with Revonda Standard about this CBC and BMET was done but no EKG or CXR needed.Pt states B/P is usually 140/70's but he gets anxious when coming to hospital so thinks it may be elevated d/t this

## 2011-05-26 ENCOUNTER — Ambulatory Visit (HOSPITAL_COMMUNITY): Payer: Medicare Other

## 2011-05-26 ENCOUNTER — Encounter (HOSPITAL_COMMUNITY): Payer: Self-pay | Admitting: Anesthesiology

## 2011-05-26 ENCOUNTER — Ambulatory Visit (HOSPITAL_COMMUNITY)
Admission: RE | Admit: 2011-05-26 | Discharge: 2011-05-27 | Disposition: A | Discharge status: Home / Self Care | Payer: Medicare Other | Source: Ambulatory Visit | Attending: Neurosurgery | Admitting: Neurosurgery

## 2011-05-26 ENCOUNTER — Encounter (HOSPITAL_COMMUNITY)
Admission: RE | Disposition: A | Discharge status: Home / Self Care | Payer: Self-pay | Source: Ambulatory Visit | Attending: Neurosurgery

## 2011-05-26 ENCOUNTER — Encounter (HOSPITAL_COMMUNITY): Payer: Self-pay | Admitting: *Deleted

## 2011-05-26 ENCOUNTER — Ambulatory Visit (HOSPITAL_COMMUNITY): Payer: Medicare Other | Admitting: Anesthesiology

## 2011-05-26 DIAGNOSIS — M5416 Radiculopathy, lumbar region: Secondary | ICD-10-CM

## 2011-05-26 DIAGNOSIS — Z01818 Encounter for other preprocedural examination: Secondary | ICD-10-CM | POA: Insufficient documentation

## 2011-05-26 DIAGNOSIS — Z01812 Encounter for preprocedural laboratory examination: Secondary | ICD-10-CM | POA: Insufficient documentation

## 2011-05-26 DIAGNOSIS — M5126 Other intervertebral disc displacement, lumbar region: Secondary | ICD-10-CM | POA: Insufficient documentation

## 2011-05-26 HISTORY — PX: LUMBAR LAMINECTOMY/DECOMPRESSION MICRODISCECTOMY: SHX5026

## 2011-05-26 SURGERY — LUMBAR LAMINECTOMY/DECOMPRESSION MICRODISCECTOMY
Anesthesia: General | Site: Back | Laterality: Right | Wound class: Clean

## 2011-05-26 MED ORDER — PHENOL 1.4 % MT LIQD: 1.0000 | OROMUCOSAL | Status: DC | PRN

## 2011-05-26 MED ORDER — DEXAMETHASONE SODIUM PHOSPHATE 4 MG/ML IJ SOLN
4.0000 mg | Freq: Four times a day (QID) | INTRAMUSCULAR | Status: AC
  Administered 2011-05-27: 4 mg via INTRAVENOUS
  Filled 2011-05-26: qty 1

## 2011-05-26 MED ORDER — HYDROMORPHONE HCL PF 1 MG/ML IJ SOLN
1.0000 mg | INTRAMUSCULAR | Status: DC | PRN
  Administered 2011-05-27: 1 mg via INTRAMUSCULAR
  Filled 2011-05-26: qty 1

## 2011-05-26 MED ORDER — ROCURONIUM BROMIDE 100 MG/10ML IV SOLN
INTRAVENOUS | Status: DC | PRN
  Administered 2011-05-26: 10 mg via INTRAVENOUS
  Administered 2011-05-26: 50 mg via INTRAVENOUS

## 2011-05-26 MED ORDER — BACITRACIN ZINC 500 UNIT/GM EX OINT
TOPICAL_OINTMENT | CUTANEOUS | Status: DC | PRN
  Administered 2011-05-26: 1 via TOPICAL

## 2011-05-26 MED ORDER — VANCOMYCIN HCL IN DEXTROSE 1-5 GM/200ML-% IV SOLN
1000.0000 mg | INTRAVENOUS | Status: AC
  Administered 2011-05-26: 1000 mg via INTRAVENOUS

## 2011-05-26 MED ORDER — THROMBIN 5000 UNITS EX KIT
PACK | CUTANEOUS | Status: DC | PRN
  Administered 2011-05-26 (×2): 5000 [IU] via TOPICAL

## 2011-05-26 MED ORDER — SODIUM CHLORIDE 0.9 % IR SOLN
Status: DC | PRN
  Administered 2011-05-26: 12:00:00

## 2011-05-26 MED ORDER — GLYCOPYRROLATE 0.2 MG/ML IJ SOLN
INTRAMUSCULAR | Status: DC | PRN
  Administered 2011-05-26: .6 mg via INTRAVENOUS

## 2011-05-26 MED ORDER — LACTATED RINGERS IV SOLN: INTRAVENOUS | Status: DC

## 2011-05-26 MED ORDER — SODIUM CHLORIDE 0.9 % IV SOLN
INTRAVENOUS | Status: AC
  Filled 2011-05-26: qty 500

## 2011-05-26 MED ORDER — DEXAMETHASONE SODIUM PHOSPHATE 10 MG/ML IJ SOLN: 10.0000 mg | Freq: Once | INTRAMUSCULAR | Status: DC

## 2011-05-26 MED ORDER — VANCOMYCIN HCL IN DEXTROSE 1-5 GM/200ML-% IV SOLN
1000.0000 mg | Freq: Two times a day (BID) | INTRAVENOUS | Status: DC
  Administered 2011-05-27: 1000 mg via INTRAVENOUS
  Filled 2011-05-26 (×2): qty 200

## 2011-05-26 MED ORDER — BUPIVACAINE HCL (PF) 0.5 % IJ SOLN
INTRAMUSCULAR | Status: DC | PRN
  Administered 2011-05-26: 20 mL

## 2011-05-26 MED ORDER — ZOLPIDEM TARTRATE 5 MG PO TABS
5.0000 mg | ORAL_TABLET | Freq: Every evening | ORAL | Status: DC | PRN
  Administered 2011-05-27: 5 mg via ORAL
  Filled 2011-05-26: qty 1

## 2011-05-26 MED ORDER — ACETAMINOPHEN 650 MG RE SUPP: 650.0000 mg | RECTAL | Status: DC | PRN

## 2011-05-26 MED ORDER — HYDROMORPHONE HCL PF 1 MG/ML IJ SOLN: 0.2500 mg | INTRAMUSCULAR | Status: DC | PRN

## 2011-05-26 MED ORDER — SODIUM CHLORIDE 0.9 % IJ SOLN: 3.0000 mL | INTRAMUSCULAR | Status: DC | PRN

## 2011-05-26 MED ORDER — KETOROLAC TROMETHAMINE 30 MG/ML IJ SOLN
INTRAMUSCULAR | Status: DC | PRN
  Administered 2011-05-26: 30 mg via INTRAVENOUS

## 2011-05-26 MED ORDER — ASPIRIN EC 81 MG PO TBEC
81.0000 mg | DELAYED_RELEASE_TABLET | Freq: Every day | ORAL | Status: DC
  Administered 2011-05-27: 81 mg via ORAL
  Filled 2011-05-26: qty 1

## 2011-05-26 MED ORDER — ONDANSETRON HCL 4 MG/2ML IJ SOLN: 4.0000 mg | INTRAMUSCULAR | Status: DC | PRN

## 2011-05-26 MED ORDER — BACITRACIN 50000 UNITS IM SOLR
INTRAMUSCULAR | Status: AC
  Filled 2011-05-26: qty 50000

## 2011-05-26 MED ORDER — PROPOFOL 10 MG/ML IV EMUL
INTRAVENOUS | Status: DC | PRN
  Administered 2011-05-26: 200 mg via INTRAVENOUS

## 2011-05-26 MED ORDER — LACTATED RINGERS IV SOLN
INTRAVENOUS | Status: DC | PRN
  Administered 2011-05-26 (×2): via INTRAVENOUS

## 2011-05-26 MED ORDER — PHENYLEPHRINE HCL 10 MG/ML IJ SOLN
INTRAMUSCULAR | Status: DC | PRN
  Administered 2011-05-26: 40 ug via INTRAVENOUS

## 2011-05-26 MED ORDER — EPHEDRINE SULFATE 50 MG/ML IJ SOLN
INTRAMUSCULAR | Status: DC | PRN
  Administered 2011-05-26: 5 mg via INTRAVENOUS
  Administered 2011-05-26: 10 mg via INTRAVENOUS
  Administered 2011-05-26 (×2): 5 mg via INTRAVENOUS

## 2011-05-26 MED ORDER — MEPERIDINE HCL 25 MG/ML IJ SOLN: 6.2500 mg | INTRAMUSCULAR | Status: DC | PRN

## 2011-05-26 MED ORDER — HEMOSTATIC AGENTS (NO CHARGE) OPTIME
TOPICAL | Status: DC | PRN
  Administered 2011-05-26: 1 via TOPICAL

## 2011-05-26 MED ORDER — MENTHOL 3 MG MT LOZG
1.0000 | LOZENGE | OROMUCOSAL | Status: DC | PRN
  Filled 2011-05-26: qty 9

## 2011-05-26 MED ORDER — SODIUM CHLORIDE 0.9 % IJ SOLN
3.0000 mL | Freq: Two times a day (BID) | INTRAMUSCULAR | Status: DC
  Administered 2011-05-26: 3 mL via INTRAVENOUS

## 2011-05-26 MED ORDER — DIPHENHYDRAMINE HCL 50 MG/ML IJ SOLN
INTRAMUSCULAR | Status: AC
  Filled 2011-05-26: qty 1

## 2011-05-26 MED ORDER — DEXAMETHASONE 4 MG PO TABS
4.0000 mg | ORAL_TABLET | Freq: Four times a day (QID) | ORAL | Status: AC
  Administered 2011-05-26: 4 mg via ORAL
  Filled 2011-05-26: qty 1

## 2011-05-26 MED ORDER — TOBRAMYCIN SULFATE 80 MG/2ML IJ SOLN
80.0000 mg | INTRAVENOUS | Status: AC
  Administered 2011-05-26: 80 mg via INTRAVENOUS
  Filled 2011-05-26: qty 2

## 2011-05-26 MED ORDER — PROMETHAZINE HCL 25 MG/ML IJ SOLN
6.2500 mg | INTRAMUSCULAR | Status: DC | PRN
Start: 2011-05-26 — End: 2011-05-26

## 2011-05-26 MED ORDER — VANCOMYCIN HCL IN DEXTROSE 1-5 GM/200ML-% IV SOLN
INTRAVENOUS | Status: AC
  Filled 2011-05-26: qty 200

## 2011-05-26 MED ORDER — SODIUM CHLORIDE 0.9 % IR SOLN
Status: DC | PRN
  Administered 2011-05-26: 1000 mL

## 2011-05-26 MED ORDER — CYCLOBENZAPRINE HCL 10 MG PO TABS
10.0000 mg | ORAL_TABLET | Freq: Three times a day (TID) | ORAL | Status: DC | PRN
  Administered 2011-05-26 – 2011-05-27 (×3): 10 mg via ORAL
  Filled 2011-05-26 (×3): qty 1

## 2011-05-26 MED ORDER — ACETAMINOPHEN 325 MG PO TABS: 650.0000 mg | ORAL_TABLET | ORAL | Status: DC | PRN

## 2011-05-26 MED ORDER — ONDANSETRON HCL 4 MG/2ML IJ SOLN
INTRAMUSCULAR | Status: DC | PRN
  Administered 2011-05-26: 4 mg via INTRAVENOUS

## 2011-05-26 MED ORDER — NEOSTIGMINE METHYLSULFATE 1 MG/ML IJ SOLN
INTRAMUSCULAR | Status: DC | PRN
  Administered 2011-05-26: 4 mg via INTRAVENOUS

## 2011-05-26 MED ORDER — DEXAMETHASONE SODIUM PHOSPHATE 10 MG/ML IJ SOLN
INTRAMUSCULAR | Status: AC
  Administered 2011-05-26: 10 mg via INTRAVENOUS
  Filled 2011-05-26: qty 1

## 2011-05-26 MED ORDER — DIPHENHYDRAMINE HCL 50 MG/ML IJ SOLN
50.0000 mg | Freq: Once | INTRAMUSCULAR | Status: AC
  Administered 2011-05-26: 50 mg via INTRAVENOUS

## 2011-05-26 MED ORDER — KETOROLAC TROMETHAMINE 30 MG/ML IJ SOLN
30.0000 mg | Freq: Four times a day (QID) | INTRAMUSCULAR | Status: DC
  Administered 2011-05-26 – 2011-05-27 (×4): 30 mg via INTRAVENOUS
  Filled 2011-05-26 (×7): qty 1

## 2011-05-26 MED ORDER — KCL IN DEXTROSE-NACL 20-5-0.45 MEQ/L-%-% IV SOLN
80.0000 mL/h | INTRAVENOUS | Status: DC
  Filled 2011-05-26 (×3): qty 1000

## 2011-05-26 MED ORDER — FENTANYL CITRATE 0.05 MG/ML IJ SOLN
INTRAMUSCULAR | Status: DC | PRN
  Administered 2011-05-26: 100 ug via INTRAVENOUS

## 2011-05-26 MED ORDER — HYDROCODONE-ACETAMINOPHEN 5-325 MG PO TABS
1.0000 | ORAL_TABLET | ORAL | Status: DC | PRN
  Administered 2011-05-26 – 2011-05-27 (×2): 1 via ORAL
  Administered 2011-05-27: 2 via ORAL
  Administered 2011-05-27: 1 via ORAL
  Filled 2011-05-26: qty 2
  Filled 2011-05-26 (×3): qty 1

## 2011-05-26 MED ORDER — MIDAZOLAM HCL 5 MG/5ML IJ SOLN
INTRAMUSCULAR | Status: DC | PRN
  Administered 2011-05-26: 2 mg via INTRAVENOUS

## 2011-05-26 SURGICAL SUPPLY — 43 items
BAG DECANTER FOR FLEXI CONT (MISCELLANEOUS) ×2 IMPLANT
BENZOIN TINCTURE PRP APPL 2/3 (GAUZE/BANDAGES/DRESSINGS) ×2 IMPLANT
BLADE SURG ROTATE 9660 (MISCELLANEOUS) ×2 IMPLANT
BRUSH SCRUB EZ PLAIN DRY (MISCELLANEOUS) ×2 IMPLANT
CANISTER SUCTION 2500CC (MISCELLANEOUS) ×2 IMPLANT
CLOTH BEACON ORANGE TIMEOUT ST (SAFETY) ×2 IMPLANT
CONT SPEC 4OZ CLIKSEAL STRL BL (MISCELLANEOUS) ×2 IMPLANT
DRAPE LAPAROTOMY 100X72X124 (DRAPES) ×2 IMPLANT
DRAPE MICROSCOPE ZEISS OPMI (DRAPES) ×2 IMPLANT
DRAPE POUCH INSTRU U-SHP 10X18 (DRAPES) ×2 IMPLANT
DRAPE SURG 17X23 STRL (DRAPES) ×4 IMPLANT
DRESSING TELFA 8X3 (GAUZE/BANDAGES/DRESSINGS) ×2 IMPLANT
ELECT REM PT RETURN 9FT ADLT (ELECTROSURGICAL) ×2
ELECTRODE REM PT RTRN 9FT ADLT (ELECTROSURGICAL) ×1 IMPLANT
GAUZE SPONGE 4X4 12PLY STRL LF (GAUZE/BANDAGES/DRESSINGS) ×2 IMPLANT
GAUZE SPONGE 4X4 16PLY XRAY LF (GAUZE/BANDAGES/DRESSINGS) IMPLANT
GLOVE BIOGEL PI IND STRL 7.0 (GLOVE) ×3 IMPLANT
GLOVE BIOGEL PI INDICATOR 7.0 (GLOVE) ×3
GLOVE ECLIPSE 6.5 STRL STRAW (GLOVE) ×2 IMPLANT
GLOVE ECLIPSE 7.5 STRL STRAW (GLOVE) ×2 IMPLANT
GLOVE SURG SS PI 6.5 STRL IVOR (GLOVE) ×8 IMPLANT
GOWN BRE IMP SLV AUR LG STRL (GOWN DISPOSABLE) ×6 IMPLANT
GOWN BRE IMP SLV AUR XL STRL (GOWN DISPOSABLE) ×4 IMPLANT
GOWN STRL REIN 2XL LVL4 (GOWN DISPOSABLE) IMPLANT
KIT BASIN OR (CUSTOM PROCEDURE TRAY) ×2 IMPLANT
KIT ROOM TURNOVER OR (KITS) ×2 IMPLANT
NEEDLE HYPO 22GX1.5 SAFETY (NEEDLE) ×2 IMPLANT
NEEDLE SPNL 22GX3.5 QUINCKE BK (NEEDLE) ×2 IMPLANT
NS IRRIG 1000ML POUR BTL (IV SOLUTION) ×2 IMPLANT
PACK LAMINECTOMY NEURO (CUSTOM PROCEDURE TRAY) ×2 IMPLANT
PAD ARMBOARD 7.5X6 YLW CONV (MISCELLANEOUS) ×6 IMPLANT
PATTIES SURGICAL .75X.75 (GAUZE/BANDAGES/DRESSINGS) IMPLANT
RUBBERBAND STERILE (MISCELLANEOUS) ×4 IMPLANT
SPONGE GAUZE 4X4 12PLY (GAUZE/BANDAGES/DRESSINGS) ×2 IMPLANT
SPONGE SURGIFOAM ABS GEL SZ50 (HEMOSTASIS) ×2 IMPLANT
STAPLER SKIN PROX WIDE 3.9 (STAPLE) ×2 IMPLANT
SUT VIC AB 2-0 OS6 18 (SUTURE) ×6 IMPLANT
SUT VIC AB 3-0 CP2 18 (SUTURE) ×4 IMPLANT
SYR 20ML ECCENTRIC (SYRINGE) ×2 IMPLANT
TOOL FLUTED BALL 7MM (MISCELLANEOUS) ×2 IMPLANT
TOWEL OR 17X24 6PK STRL BLUE (TOWEL DISPOSABLE) ×2 IMPLANT
TOWEL OR 17X26 10 PK STRL BLUE (TOWEL DISPOSABLE) ×2 IMPLANT
WATER STERILE IRR 1000ML POUR (IV SOLUTION) ×2 IMPLANT

## 2011-05-26 NOTE — Progress Notes (Signed)
ANTIBIOTIC CONSULT NOTE - INITIAL  Pharmacy Consult for Vancomycin Indication: Surgical Prophylaxis  Allergies  Allergen Reactions  . Ibuprofen     REACTION: itching  . Penicillins Hives  . Sulfonamide Derivatives Other (See Comments)    Almost stopped heart in the service..Also, causes hives    Patient Measurements:    Weight: 99.4kg  Vital Signs: Temp: 97.4 F (36.3 C) (12/11 1450) Temp src: Oral (12/11 1014) BP: 154/77 mmHg (12/11 1450) Pulse Rate: 67  (12/11 1450)  Labs: No results found for this basename: WBC:3,HGB:3,PLT:3,LABCREA:3,CREATININE:3 in the last 72 hours The CrCl is unknown because both a height and weight (above a minimum accepted value) are required for this calculation. No results found for this basename: VANCOTROUGH:2,VANCOPEAK:2,VANCORANDOM:2,GENTTROUGH:2,GENTPEAK:2,GENTRANDOM:2,TOBRATROUGH:2,TOBRAPEAK:2,TOBRARND:2,AMIKACINPEAK:2,AMIKACINTROU:2,AMIKACIN:2, in the last 72 hours   Microbiology: Recent Results (from the past 720 hour(s))  MRSA PCR SCREENING     Status: Normal   Collection Time   05/22/11  9:17 AM      Component Value Range Status Comment   MRSA by PCR NEGATIVE  NEGATIVE  Final     Medical History: Past Medical History  Diagnosis Date  . Elevated glucose   . Chronic back pain     pinched nerve;buldging disc  . Dry skin     on elbows;uses vasaline and it goes away  . Burping     per pt lots of burping  . Hemorrhoids   . Hx of colonic polyps     34yrs ago  . Nocturia     Assessment: 74 YOM s/p R L3-4 intralaminar laminotomy to start vancomycin for surgical prophylaxis (panicillin allergy). Pre-op dose given this morning at 1144. Will give first post- op dose at 0130 on 12/12 (12 hour post surgery)   Plan:  Start Vancomycin 1 g Q 24hrs at 0130 tomorrow morning.   Riki Rusk 05/26/2011,3:46 PM

## 2011-05-26 NOTE — H&P (Signed)
Francis Sims is an 70 y.o. male.   Chief Complaint: Right leg pain HPI: Francis Sims is a 70 year old gentleman who presented with right lower extremity pain. He had been tried on conservative therapy by his primary medical doctor without improvement. An MRI scan had been done which showed a large disc herniation and marked stenosis at L3-4. After discussing the options the patient requested surgical intervention and is admitted at this time for a right L3-4 microdiscectomy. I had a long discussion with the patient regarding the risks and benefits of surgical intervention. The risks discussed include but are not limited to bleeding infection weakness numbness paralysis spinal fluid leak, and death. We have discussed alternative methods of therapy along with the risks and benefits of nonintervention. Francis Sims is had the opportunity numerous questions and appears to understand. With this information in hand he has requested that we proceed with surgical intervention.  Past Medical History  Diagnosis Date  . Elevated glucose   . Chronic back pain     pinched nerve;buldging disc  . Dry skin     on elbows;uses vasaline and it goes away  . Burping     per pt lots of burping  . Hemorrhoids   . Hx of colonic polyps     42yrs ago  . Nocturia     Past Surgical History  Procedure Date  . Cervical fusion 2000    C5,6,7  . Hernia repair 1949  . Colonoscopy   . Cataract extraction 2008    bilateral    Family History  Problem Relation Age of Onset  . COPD Other   . Anesthesia problems Neg Hx   . Hypotension Neg Hx   . Malignant hyperthermia Neg Hx   . Pseudochol deficiency Neg Hx    Social History:  reports that he has quit smoking. He does not have any smokeless tobacco history on file. He reports that he does not drink alcohol or use illicit drugs.  Allergies:  Allergies  Allergen Reactions  . Ibuprofen     REACTION: itching  . Penicillins Hives  . Sulfonamide Derivatives Other  (See Comments)    Almost stopped heart in the service..Also, causes hives    No current facility-administered medications on file as of 05/26/2011.   Medications Prior to Admission  Medication Sig Dispense Refill  . aspirin 81 MG tablet Take 81 mg by mouth daily.        . cholecalciferol (VITAMIN D) 1000 UNITS tablet Take 1,000 Units by mouth daily.        . nabumetone (RELAFEN) 750 MG tablet Take 1 tablet (750 mg total) by mouth 2 (two) times daily.  60 tablet  2  . Omega-3 Fatty Acids (FISH OIL) 1000 MG CAPS Take 1 capsule by mouth 2 (two) times daily.        . vardenafil (LEVITRA) 20 MG tablet Take 20 mg by mouth daily as needed.          No results found for this or any previous visit (from the past 48 hour(s)). No results found.  Pertinent items are noted in HPI.  There were no vitals taken for this visit.  He has a decreased knee jerk reflex on the right as well as some quadriceps weakness on the right. Assessment/Plan Impression is that of a herniated disc at L3-4 on the right with a subsequent L4 radiculopathy. The plan is for a right L3-4 microdiscectomy. I explained to the patient also has disease at  L4-5. I believe however that at this time, his symptomatic problem is at L3-4 and we have elected to address that issue only at this time.  Francis Meeker, MD 05/26/2011, 10:05 AM

## 2011-05-26 NOTE — Anesthesia Preprocedure Evaluation (Addendum)
Anesthesia Evaluation  Patient identified by MRN, date of birth, ID band Patient awake    Reviewed: Allergy & Precautions, H&P , NPO status , Patient's Chart, lab work & pertinent test results  History of Anesthesia Complications (+) PONV  Airway Mallampati: I TM Distance: >3 FB Neck ROM: Full    Dental  (+) Teeth Intact and Dental Advisory Given   Pulmonary  clear to auscultation        Cardiovascular Regular Normal    Neuro/Psych PSYCHIATRIC DISORDERS    GI/Hepatic   Endo/Other    Renal/GU      Musculoskeletal   Abdominal   Peds  Hematology   Anesthesia Other Findings   Reproductive/Obstetrics                          Anesthesia Physical Anesthesia Plan  ASA: II  Anesthesia Plan: General   Post-op Pain Management:    Induction: Intravenous  Airway Management Planned: Oral ETT  Additional Equipment:   Intra-op Plan:   Post-operative Plan: Extubation in OR  Informed Consent: I have reviewed the patients History and Physical, chart, labs and discussed the procedure including the risks, benefits and alternatives for the proposed anesthesia with the patient or authorized representative who has indicated his/her understanding and acceptance.   Dental advisory given  Plan Discussed with: CRNA and Surgeon  Anesthesia Plan Comments:         Anesthesia Quick Evaluation

## 2011-05-26 NOTE — Anesthesia Postprocedure Evaluation (Signed)
  Anesthesia Post-op Note  Patient: Francis Sims  Procedure(s) Performed:  LUMBAR LAMINECTOMY/DECOMPRESSION MICRODISCECTOMY - Right Lumbar three-four Microdiskectomy  Patient Location: PACU  Anesthesia Type: General  Level of Consciousness: awake  Airway and Oxygen Therapy: Patient Spontanous Breathing  Post-op Pain: mild  Post-op Assessment: Post-op Vital signs reviewed  Post-op Vital Signs: stable  Complications: No apparent anesthesia complications

## 2011-05-26 NOTE — Brief Op Note (Signed)
05/26/2011  1:04 PM  PATIENT:  Raymond Gurney  70 y.o. male  PRE-OPERATIVE DIAGNOSIS:  Right Lumbar threee-four herniated nucleus pulposus  POST-OPERATIVE DIAGNOSIS:  Right Lumbar threee-four herniated nucleus pulposus  PROCEDURE:  Procedure(s): LUMBAR LAMINECTOMY/DECOMPRESSION MICRODISCECTOMY  SURGEON:  Surgeon(s): Prestyn Stanco O Andrena Margerum [employer]  PHYSICIAN ASSISTANT:   ASSISTANTS: Pool   ANESTHESIA:   general  EBL:  Total I/O In: 1000 [I.V.:1000] Out: 25 [Blood:25]  BLOOD ADMINISTERED:none  DRAINS: none   LOCAL MEDICATIONS USED:  MARCAINE 20CC  SPECIMEN:  No Specimen  DISPOSITION OF SPECIMEN:  N/A  COUNTS:  YES  TOURNIQUET:  * No tourniquets in log *  DICTATION: .Dragon Dictation  PLAN OF CARE: Admit for overnight observation  PATIENT DISPOSITION:  PACU - hemodynamically stable.   Delay start of Pharmacological VTE agent (>24hrs) due to surgical blood loss or risk of bleeding:  {YES/NO/NOT APPLICABLE:20182

## 2011-05-26 NOTE — Transfer of Care (Signed)
Immediate Anesthesia Transfer of Care Note  Patient: Francis Sims  Procedure(s) Performed:  LUMBAR LAMINECTOMY/DECOMPRESSION MICRODISCECTOMY - Right Lumbar three-four Microdiskectomy  Patient Location: PACU  Anesthesia Type: General  Level of Consciousness: oriented, sedated and responds to stimulation  Airway & Oxygen Therapy: Patient Spontanous Breathing and Patient connected to nasal cannula oxygen  Post-op Assessment: Report given to PACU RN, Post -op Vital signs reviewed and stable and Patient moving all extremities X 4  Post vital signs: Reviewed and stable  Complications: No apparent anesthesia complications

## 2011-05-26 NOTE — Op Note (Signed)
Preop diagnosis: Herniated disc L3-4 right with stenosis Postop diagnosis: Same Procedure: Right L3 4 intralaminar laminotomy for excision of herniated disc with operating microscope Surgeon: Kellan Boehlke Assistant:pool  After being placed in the prone position the patient's back was prepped and draped in the usual sterile fashion. Localizing x-ray was taken prior to incision to identify the appropriate level. Midline incision was made above the spinous processes of L3 and L4. Using Bovie cutting current the incision was carried on the spinous processes and subperiosteal dissection was carried out on the right side of the spinous processes and lamina. Self-retaining retractor was placed for exposure and x-ray showed approach the appropriate level. Using the high-speed drill the inferior one third of the L3 lamina and the medial one third of the facet joint were removed. Drill was then used to remove the superior one third of the L4 lamina. Residual bone and ligamentum flavum removed and the microscope was draped brought into the field and used for the remainder of the case. His microsecond technique the lateral aspect of the thecal sac and L4 nerve root were identified. Further coagulation was carried out down to the for the canal to identify the L3-4 disc which was found to be tremendously herniated. The annulus of the disc was incised with a 15 blade and the disc and thoroughly cleaned out with a variety of pituitary rongeurs and curettes. Thorough displaced cleanout was carried out while at the same time great care was taken to avoid injury to the neural elements and this was successfully done. At this point inspection was carried out all directions for any evidence of residual compression and none could be identified. Amounts of irrigation were carried out any bleeding was controlled bipolar coagulation and Gelfoam. Was then closed in multiple layers of Vicryl on the muscle fascia subcutaneous and subcuticular  tissues. Staples were placed on the skin. A sterile dressing was then applied the patient was extubated and taken to recovery room in stable condition.

## 2011-05-27 MED ORDER — HYDROCODONE-ACETAMINOPHEN 5-325 MG PO TABS: 1.0000 | ORAL_TABLET | ORAL | Status: AC | PRN

## 2011-05-27 NOTE — Discharge Summary (Signed)
  Physician Discharge Summary  Patient ID: Francis Sims MRN: [id number] DOB/AGE: January 06, 1941 70 y.o.  Admit date: 05/26/2011 Discharge date: 05/27/2011  Admission Diagnoses:  Discharge Diagnoses:  Active Problems:  * No active hospital problems. *    Discharged Condition: good  Hospital Course: Surgery one day, home the next. No issues.  Consults: none  Significant Diagnostic Studies: none   Treatments: surgery: L 34 microdiscectomy  Discharge Exam: Blood pressure 130/74, pulse 68, temperature 97.9 F (36.6 C), temperature source Oral, resp. rate 20, height 6\' 2"  (1.88 m), weight 99.4 kg (219 lb 2.2 oz), SpO2 96.00%. Incision/Wound:Looks good  Disposition:   Discharge Orders    Future Orders Please Complete By Expires   Diet general      Discharge instructions      Comments:   Mostly bedrest. Get up 9 or 10 times each day and walk for 15-20 minutes each time. Very little sitting the first week. No riding in the car until your first post op appointment. If you had neck surgery...may shower from the chest down. If you had low back surgery....you may shower with a saran wrap covering over the incision. Take your pain medicine as needed...and other medicines that you are instructed to take. Call for an appointment...[phone].   Call MD for:  temperature >100.4      Call MD for:  persistant nausea and vomiting      Call MD for:  severe uncontrolled pain      Call MD for:  redness, tenderness, or signs of infection (pain, swelling, redness, odor or green/yellow discharge around incision site)      Call MD for:  difficulty breathing, headache or visual disturbances      Call MD for:  hives        Current Discharge Medication List    START taking these medications   Details  HYDROcodone-acetaminophen (NORCO) 5-325 MG per tablet Take 1-2 tablets by mouth every 4 (four) hours as needed. Qty: 50 tablet, Refills: 1      CONTINUE these medications which have NOT CHANGED   Details  aspirin 81 MG tablet Take 81 mg by mouth daily.      cholecalciferol (VITAMIN D) 1000 UNITS tablet Take 1,000 Units by mouth daily.      Omega-3 Fatty Acids (FISH OIL) 1000 MG CAPS Take 1 capsule by mouth 2 (two) times daily.        STOP taking these medications     nabumetone (RELAFEN) 750 MG tablet      OVER THE COUNTER MEDICATION      traMADol (ULTRAM) 50 MG tablet      vardenafil (LEVITRA) 20 MG tablet          At home rest most of the time. Get up 9 or 10 times each day and take a 15 or 20 minute walk. No riding in the car and to your first postoperative appointment. If you have neck surgery you may shower from the chest down starting on the third postoperative day. If you had back surgery he may start showering on the third postoperative day with saran wrap wrapped around your incisional area 3 times. After the shower remove the saran wrap. Take pain medicine as needed and other medications as instructed. Call my office for an appointment.  Signed: Reinaldo Meeker, MD 05/27/2011, 12:50 PM

## 2011-05-28 ENCOUNTER — Encounter (HOSPITAL_COMMUNITY): Payer: Self-pay | Admitting: Neurosurgery

## 2011-07-10 ENCOUNTER — Other Ambulatory Visit: Payer: Self-pay | Admitting: Neurosurgery

## 2011-07-10 DIAGNOSIS — M549 Dorsalgia, unspecified: Secondary | ICD-10-CM

## 2011-07-13 ENCOUNTER — Other Ambulatory Visit: Payer: Self-pay | Admitting: Neurosurgery

## 2011-07-13 ENCOUNTER — Other Ambulatory Visit: Payer: Self-pay | Admitting: Cardiology

## 2011-07-13 ENCOUNTER — Ambulatory Visit
Admission: RE | Admit: 2011-07-13 | Discharge: 2011-07-13 | Disposition: A | Discharge status: Home / Self Care | Payer: Medicare Other | Source: Ambulatory Visit | Attending: Neurosurgery | Admitting: Neurosurgery

## 2011-07-13 DIAGNOSIS — M549 Dorsalgia, unspecified: Secondary | ICD-10-CM

## 2011-07-13 DIAGNOSIS — M5416 Radiculopathy, lumbar region: Secondary | ICD-10-CM

## 2011-07-13 DIAGNOSIS — I6529 Occlusion and stenosis of unspecified carotid artery: Secondary | ICD-10-CM

## 2011-07-13 MED ORDER — DIAZEPAM 5 MG PO TABS
5.0000 mg | ORAL_TABLET | Freq: Once | ORAL | Status: AC
  Administered 2011-07-13: 5 mg via ORAL

## 2011-07-13 MED ORDER — ONDANSETRON HCL 4 MG/2ML IJ SOLN
4.0000 mg | Freq: Once | INTRAMUSCULAR | Status: AC
  Administered 2011-07-13: 4 mg via INTRAMUSCULAR

## 2011-07-13 MED ORDER — MEPERIDINE HCL 100 MG/ML IJ SOLN
75.0000 mg | Freq: Once | INTRAMUSCULAR | Status: AC
  Administered 2011-07-13: 75 mg via INTRAMUSCULAR

## 2011-07-13 MED ORDER — IOHEXOL 180 MG/ML  SOLN
17.0000 mL | Freq: Once | INTRAMUSCULAR | Status: AC | PRN
  Administered 2011-07-13: 17 mL via INTRAVENOUS

## 2011-07-13 NOTE — Progress Notes (Signed)
Patient states he hasn't taken Tramadol in a "long time."  jkl

## 2011-07-13 NOTE — Progress Notes (Signed)
Denies pain or discomfort at present.  Wife at bedside.  jkl

## 2011-07-14 ENCOUNTER — Ambulatory Visit
Admission: RE | Admit: 2011-07-14 | Discharge: 2011-07-14 | Disposition: A | Discharge status: Home / Self Care | Payer: Medicare Other | Source: Ambulatory Visit | Attending: Neurosurgery | Admitting: Neurosurgery

## 2011-07-14 DIAGNOSIS — M549 Dorsalgia, unspecified: Secondary | ICD-10-CM

## 2011-07-14 MED ORDER — METHYLPREDNISOLONE ACETATE 40 MG/ML INJ SUSP (RADIOLOG
120.0000 mg | Freq: Once | INTRAMUSCULAR | Status: AC
  Administered 2011-07-14: 120 mg via EPIDURAL

## 2011-07-14 MED ORDER — IOHEXOL 180 MG/ML  SOLN
1.0000 mL | Freq: Once | INTRAMUSCULAR | Status: AC | PRN
  Administered 2011-07-14: 1 mL via EPIDURAL

## 2011-07-15 ENCOUNTER — Encounter: Payer: Medicare Other | Admitting: Cardiology

## 2011-07-21 ENCOUNTER — Encounter (HOSPITAL_COMMUNITY): Payer: Self-pay | Admitting: Pharmacy Technician

## 2011-07-22 ENCOUNTER — Encounter (HOSPITAL_COMMUNITY)
Admission: RE | Admit: 2011-07-22 | Discharge: 2011-07-22 | Disposition: A | Discharge status: Home / Self Care | Payer: Medicare Other | Source: Ambulatory Visit | Attending: Neurosurgery | Admitting: Neurosurgery

## 2011-07-22 ENCOUNTER — Encounter (HOSPITAL_COMMUNITY): Payer: Self-pay

## 2011-07-22 ENCOUNTER — Other Ambulatory Visit (HOSPITAL_COMMUNITY): Payer: Self-pay | Admitting: *Deleted

## 2011-07-22 HISTORY — DX: Gastro-esophageal reflux disease without esophagitis: K21.9

## 2011-07-22 HISTORY — DX: Other specified postprocedural states: Z98.890

## 2011-07-22 HISTORY — DX: Other specified postprocedural states: R11.2

## 2011-07-22 LAB — TYPE AND SCREEN
ABO/RH(D): O POS
Antibody Screen: NEGATIVE

## 2011-07-22 LAB — CBC
HCT: 47.7 % (ref 39.0–52.0)
Hemoglobin: 16.7 g/dL (ref 13.0–17.0)
MCH: 29.8 pg (ref 26.0–34.0)
MCHC: 35 g/dL (ref 30.0–36.0)
MCV: 85 fL (ref 78.0–100.0)
Platelets: 229 10*3/uL (ref 150–400)
RBC: 5.61 MIL/uL (ref 4.22–5.81)
RDW: 13.9 % (ref 11.5–15.5)
WBC: 12.8 10*3/uL — ABNORMAL HIGH (ref 4.0–10.5)

## 2011-07-22 LAB — SURGICAL PCR SCREEN
MRSA, PCR: NEGATIVE
Staphylococcus aureus: NEGATIVE

## 2011-07-22 LAB — ABO/RH: ABO/RH(D): O POS

## 2011-07-22 NOTE — Pre-Procedure Instructions (Signed)
20 Francis Sims  07/22/2011   Your procedure is scheduled on:07-24-2011 @ 7:30 AM Report to [facility] at 5:30 AM Call this number if you have problems the morning of surgery: [phone]   Remember:   Do not eat food:After Midnight.  May have clear liquids: up to 4 Hours before arrival.  Clear liquids include soda, tea, black coffee, apple or grape juice, broth.1:30 AM  Take these medicines the morning of surgery with A SIP OF WATER:Valium,Hydrocodone   Do not wear jewelry, make-up or nail polish.  Do not wear lotions, powders, or perfumes. You may wear deodorant.  Do not shave 48 hours prior to surgery.  Do not bring valuables to the hospital.  Contacts, dentures or bridgework may not be worn into surgery.  Leave suitcase in the car. After surgery it may be brought to your room.  For patients admitted to the hospital, checkout time is 11:00 AM the day of discharge.    Special Instructions: CHG Shower Use Special Wash: 1/2 bottle night before surgery and 1/2 bottle morning of surgery.   Please read over the following fact sheets that you were given: Blood Transfusion Information, MRSA Information and Surgical Site Infection Prevention

## 2011-07-23 MED ORDER — TOBRAMYCIN SULFATE 80 MG/2ML IJ SOLN
80.0000 mg | Freq: Once | INTRAVENOUS | Status: AC
  Administered 2011-07-24: 80 mg via INTRAVENOUS
  Filled 2011-07-23: qty 2

## 2011-07-23 MED ORDER — VANCOMYCIN HCL 500 MG IV SOLR
500.0000 mg | Freq: Once | INTRAVENOUS | Status: AC
  Administered 2011-07-24: 500 mg via INTRAVENOUS
  Filled 2011-07-23: qty 500

## 2011-07-24 ENCOUNTER — Other Ambulatory Visit: Payer: Self-pay

## 2011-07-24 ENCOUNTER — Ambulatory Visit (HOSPITAL_COMMUNITY): Payer: Medicare Other

## 2011-07-24 ENCOUNTER — Encounter (HOSPITAL_COMMUNITY): Payer: Self-pay | Admitting: Critical Care Medicine

## 2011-07-24 ENCOUNTER — Encounter (HOSPITAL_COMMUNITY): Payer: Self-pay | Admitting: *Deleted

## 2011-07-24 ENCOUNTER — Encounter (HOSPITAL_COMMUNITY)
Admission: RE | Disposition: A | Discharge status: Home / Self Care | Payer: Self-pay | Source: Ambulatory Visit | Attending: Neurosurgery

## 2011-07-24 ENCOUNTER — Inpatient Hospital Stay (HOSPITAL_COMMUNITY)
Admission: RE | Admit: 2011-07-24 | Discharge: 2011-07-27 | DRG: 460 | Disposition: A | Discharge status: Home / Self Care | Payer: Medicare Other | Source: Ambulatory Visit | Attending: Neurosurgery | Admitting: Neurosurgery

## 2011-07-24 ENCOUNTER — Ambulatory Visit (HOSPITAL_COMMUNITY): Payer: Medicare Other | Admitting: Critical Care Medicine

## 2011-07-24 DIAGNOSIS — M5416 Radiculopathy, lumbar region: Secondary | ICD-10-CM

## 2011-07-24 DIAGNOSIS — Z981 Arthrodesis status: Secondary | ICD-10-CM

## 2011-07-24 DIAGNOSIS — Z888 Allergy status to other drugs, medicaments and biological substances status: Secondary | ICD-10-CM

## 2011-07-24 DIAGNOSIS — K219 Gastro-esophageal reflux disease without esophagitis: Secondary | ICD-10-CM | POA: Diagnosis present

## 2011-07-24 DIAGNOSIS — Z87891 Personal history of nicotine dependence: Secondary | ICD-10-CM

## 2011-07-24 DIAGNOSIS — Z01812 Encounter for preprocedural laboratory examination: Secondary | ICD-10-CM

## 2011-07-24 DIAGNOSIS — M5126 Other intervertebral disc displacement, lumbar region: Principal | ICD-10-CM | POA: Diagnosis present

## 2011-07-24 DIAGNOSIS — Z79899 Other long term (current) drug therapy: Secondary | ICD-10-CM

## 2011-07-24 DIAGNOSIS — Z860100 Personal history of colon polyps, unspecified: Secondary | ICD-10-CM

## 2011-07-24 DIAGNOSIS — Q762 Congenital spondylolisthesis: Secondary | ICD-10-CM

## 2011-07-24 DIAGNOSIS — G8929 Other chronic pain: Secondary | ICD-10-CM | POA: Diagnosis present

## 2011-07-24 DIAGNOSIS — Z882 Allergy status to sulfonamides status: Secondary | ICD-10-CM

## 2011-07-24 DIAGNOSIS — Z880 Allergy status to penicillin: Secondary | ICD-10-CM

## 2011-07-24 SURGERY — POSTERIOR LUMBAR FUSION 2 LEVEL
Anesthesia: General | Site: Back | Laterality: Bilateral | Wound class: Clean

## 2011-07-24 MED ORDER — MIDAZOLAM HCL 5 MG/5ML IJ SOLN
INTRAMUSCULAR | Status: DC | PRN
  Administered 2011-07-24: 2 mg via INTRAVENOUS

## 2011-07-24 MED ORDER — DIPHENHYDRAMINE HCL 50 MG/ML IJ SOLN
50.0000 mg | Freq: Once | INTRAMUSCULAR | Status: AC
  Administered 2011-07-24: 25 mg via INTRAVENOUS

## 2011-07-24 MED ORDER — SODIUM CHLORIDE 0.9 % IJ SOLN: 3.0000 mL | INTRAMUSCULAR | Status: DC | PRN

## 2011-07-24 MED ORDER — MEPERIDINE HCL 25 MG/ML IJ SOLN: 6.2500 mg | INTRAMUSCULAR | Status: DC | PRN

## 2011-07-24 MED ORDER — HETASTARCH-ELECTROLYTES 6 % IV SOLN
INTRAVENOUS | Status: DC | PRN
  Administered 2011-07-24: 11:00:00 via INTRAVENOUS

## 2011-07-24 MED ORDER — PROMETHAZINE HCL 25 MG/ML IJ SOLN: 6.2500 mg | INTRAMUSCULAR | Status: DC | PRN

## 2011-07-24 MED ORDER — HYDROMORPHONE HCL PF 1 MG/ML IJ SOLN
0.2500 mg | INTRAMUSCULAR | Status: DC | PRN
  Administered 2011-07-24 (×2): 0.5 mg via INTRAVENOUS

## 2011-07-24 MED ORDER — PROPOFOL 10 MG/ML IV EMUL
INTRAVENOUS | Status: DC | PRN
  Administered 2011-07-24: 200 mg via INTRAVENOUS

## 2011-07-24 MED ORDER — GLYCOPYRROLATE 0.2 MG/ML IJ SOLN
INTRAMUSCULAR | Status: DC | PRN
  Administered 2011-07-24: .6 mg via INTRAVENOUS
  Administered 2011-07-24: 0.1 mg via INTRAVENOUS

## 2011-07-24 MED ORDER — THROMBIN 5000 UNITS EX KIT
PACK | CUTANEOUS | Status: DC | PRN
  Administered 2011-07-24 (×2): 5000 [IU] via TOPICAL

## 2011-07-24 MED ORDER — DEXAMETHASONE SODIUM PHOSPHATE 4 MG/ML IJ SOLN
4.0000 mg | Freq: Four times a day (QID) | INTRAMUSCULAR | Status: AC
  Administered 2011-07-24 (×2): 4 mg via INTRAVENOUS
  Filled 2011-07-24 (×2): qty 1

## 2011-07-24 MED ORDER — DEXAMETHASONE SODIUM PHOSPHATE 10 MG/ML IJ SOLN
10.0000 mg | Freq: Once | INTRAMUSCULAR | Status: AC
  Administered 2011-07-24: 10 mg via INTRAVENOUS

## 2011-07-24 MED ORDER — VANCOMYCIN HCL IN DEXTROSE 1-5 GM/200ML-% IV SOLN
1000.0000 mg | Freq: Two times a day (BID) | INTRAVENOUS | Status: DC
  Administered 2011-07-24 – 2011-07-26 (×4): 1000 mg via INTRAVENOUS
  Filled 2011-07-24 (×6): qty 200

## 2011-07-24 MED ORDER — DIAZEPAM 5 MG PO TABS
5.0000 mg | ORAL_TABLET | Freq: Four times a day (QID) | ORAL | Status: DC | PRN
  Administered 2011-07-24 – 2011-07-27 (×8): 5 mg via ORAL
  Filled 2011-07-24 (×8): qty 1

## 2011-07-24 MED ORDER — FENTANYL CITRATE 0.05 MG/ML IJ SOLN
INTRAMUSCULAR | Status: DC | PRN
  Administered 2011-07-24 (×2): 50 ug via INTRAVENOUS
  Administered 2011-07-24: 150 ug via INTRAVENOUS
  Administered 2011-07-24 (×5): 50 ug via INTRAVENOUS

## 2011-07-24 MED ORDER — HYDROCODONE-ACETAMINOPHEN 5-325 MG PO TABS
1.0000 | ORAL_TABLET | ORAL | Status: DC | PRN
  Administered 2011-07-24 – 2011-07-27 (×9): 2 via ORAL
  Filled 2011-07-24 (×9): qty 2

## 2011-07-24 MED ORDER — THROMBIN 20000 UNITS EX KIT
PACK | CUTANEOUS | Status: DC | PRN
  Administered 2011-07-24: 08:00:00 via TOPICAL

## 2011-07-24 MED ORDER — SODIUM CHLORIDE 0.9 % IV SOLN
INTRAVENOUS | Status: AC
  Filled 2011-07-24: qty 500

## 2011-07-24 MED ORDER — ACETAMINOPHEN 650 MG RE SUPP: 650.0000 mg | RECTAL | Status: DC | PRN

## 2011-07-24 MED ORDER — INFLUENZA VIRUS VACC SPLIT PF IM SUSP: 0.5000 mL | INTRAMUSCULAR | Status: DC

## 2011-07-24 MED ORDER — BACITRACIN ZINC 500 UNIT/GM EX OINT
TOPICAL_OINTMENT | CUTANEOUS | Status: DC | PRN
  Administered 2011-07-24: 1 via TOPICAL

## 2011-07-24 MED ORDER — DROPERIDOL 2.5 MG/ML IJ SOLN
INTRAMUSCULAR | Status: DC | PRN
  Administered 2011-07-24: 0.625 mg via INTRAVENOUS

## 2011-07-24 MED ORDER — NEOSTIGMINE METHYLSULFATE 1 MG/ML IJ SOLN
INTRAMUSCULAR | Status: DC | PRN
  Administered 2011-07-24: 4 mg via INTRAVENOUS

## 2011-07-24 MED ORDER — LACTATED RINGERS IV SOLN: INTRAVENOUS | Status: DC

## 2011-07-24 MED ORDER — ZOLPIDEM TARTRATE 10 MG PO TABS
10.0000 mg | ORAL_TABLET | Freq: Every evening | ORAL | Status: DC | PRN
  Administered 2011-07-25 – 2011-07-26 (×2): 10 mg via ORAL
  Filled 2011-07-24 (×3): qty 1

## 2011-07-24 MED ORDER — SODIUM CHLORIDE 0.9 % IV SOLN: 250.0000 mL | INTRAVENOUS | Status: DC

## 2011-07-24 MED ORDER — ACETAMINOPHEN 325 MG PO TABS: 650.0000 mg | ORAL_TABLET | ORAL | Status: DC | PRN

## 2011-07-24 MED ORDER — 0.9 % SODIUM CHLORIDE (POUR BTL) OPTIME
TOPICAL | Status: DC | PRN
  Administered 2011-07-24: 1000 mL

## 2011-07-24 MED ORDER — SODIUM CHLORIDE 0.9 % IJ SOLN
3.0000 mL | Freq: Two times a day (BID) | INTRAMUSCULAR | Status: DC
  Administered 2011-07-24 – 2011-07-26 (×2): 3 mL via INTRAVENOUS

## 2011-07-24 MED ORDER — WHITE PETROLATUM GEL
Status: AC
  Filled 2011-07-24: qty 5

## 2011-07-24 MED ORDER — LACTATED RINGERS IV SOLN
INTRAVENOUS | Status: DC | PRN
  Administered 2011-07-24 (×3): via INTRAVENOUS

## 2011-07-24 MED ORDER — DIPHENHYDRAMINE HCL 50 MG/ML IJ SOLN
INTRAMUSCULAR | Status: AC
  Filled 2011-07-24: qty 1

## 2011-07-24 MED ORDER — PNEUMOCOCCAL VAC POLYVALENT 25 MCG/0.5ML IJ INJ: 0.5000 mL | INJECTION | INTRAMUSCULAR | Status: DC

## 2011-07-24 MED ORDER — ROCURONIUM BROMIDE 100 MG/10ML IV SOLN
INTRAVENOUS | Status: DC | PRN
  Administered 2011-07-24 (×2): 10 mg via INTRAVENOUS
  Administered 2011-07-24 (×2): 20 mg via INTRAVENOUS
  Administered 2011-07-24 (×2): 10 mg via INTRAVENOUS
  Administered 2011-07-24: 20 mg via INTRAVENOUS
  Administered 2011-07-24: 50 mg via INTRAVENOUS

## 2011-07-24 MED ORDER — ONDANSETRON HCL 4 MG/2ML IJ SOLN
INTRAMUSCULAR | Status: DC | PRN
  Administered 2011-07-24: 4 mg via INTRAVENOUS

## 2011-07-24 MED ORDER — HYDROMORPHONE HCL PF 1 MG/ML IJ SOLN
1.0000 mg | INTRAMUSCULAR | Status: DC | PRN
  Administered 2011-07-24: 1.5 mg via INTRAMUSCULAR
  Administered 2011-07-24 – 2011-07-25 (×3): 1 mg via INTRAMUSCULAR
  Administered 2011-07-25 (×2): 1.5 mg via INTRAMUSCULAR
  Filled 2011-07-24 (×2): qty 2
  Filled 2011-07-24 (×2): qty 1
  Filled 2011-07-24: qty 2
  Filled 2011-07-24: qty 1

## 2011-07-24 MED ORDER — ALUM & MAG HYDROXIDE-SIMETH 200-200-20 MG/5ML PO SUSP
30.0000 mL | Freq: Four times a day (QID) | ORAL | Status: DC | PRN
  Administered 2011-07-25: 30 mL via ORAL
  Filled 2011-07-24: qty 30

## 2011-07-24 MED ORDER — SODIUM CHLORIDE 0.9 % IR SOLN
Status: DC | PRN
  Administered 2011-07-24: 08:00:00

## 2011-07-24 MED ORDER — ONDANSETRON HCL 4 MG/2ML IJ SOLN: 4.0000 mg | INTRAMUSCULAR | Status: DC | PRN

## 2011-07-24 MED ORDER — PHENOL 1.4 % MT LIQD: 1.0000 | OROMUCOSAL | Status: DC | PRN

## 2011-07-24 MED ORDER — BACITRACIN 50000 UNITS IM SOLR
INTRAMUSCULAR | Status: AC
  Filled 2011-07-24: qty 1

## 2011-07-24 MED ORDER — DEXAMETHASONE 4 MG PO TABS
4.0000 mg | ORAL_TABLET | Freq: Four times a day (QID) | ORAL | Status: AC
  Filled 2011-07-24 (×2): qty 1

## 2011-07-24 MED ORDER — DEXAMETHASONE SODIUM PHOSPHATE 10 MG/ML IJ SOLN
INTRAMUSCULAR | Status: AC
  Filled 2011-07-24: qty 1

## 2011-07-24 MED ORDER — EPHEDRINE SULFATE 50 MG/ML IJ SOLN
INTRAMUSCULAR | Status: DC | PRN
  Administered 2011-07-24 (×3): 5 mg via INTRAVENOUS

## 2011-07-24 MED ORDER — HYDROMORPHONE HCL PF 1 MG/ML IJ SOLN
INTRAMUSCULAR | Status: AC
  Administered 2011-07-24: 1 mg via INTRAMUSCULAR
  Filled 2011-07-24: qty 1

## 2011-07-24 MED ORDER — MENTHOL 3 MG MT LOZG: 1.0000 | LOZENGE | OROMUCOSAL | Status: DC | PRN

## 2011-07-24 MED ORDER — MORPHINE SULFATE 2 MG/ML IJ SOLN: 5.0000 mg | INTRAMUSCULAR | Status: DC | PRN

## 2011-07-24 MED ORDER — KCL IN DEXTROSE-NACL 20-5-0.45 MEQ/L-%-% IV SOLN
80.0000 mL/h | INTRAVENOUS | Status: DC
  Administered 2011-07-24 – 2011-07-25 (×2): 80 mL/h via INTRAVENOUS
  Filled 2011-07-24 (×7): qty 1000

## 2011-07-24 SURGICAL SUPPLY — 65 items
BAG DECANTER FOR FLEXI CONT (MISCELLANEOUS) ×2 IMPLANT
BENZOIN TINCTURE PRP APPL 2/3 (GAUZE/BANDAGES/DRESSINGS) ×2 IMPLANT
BLADE SURG ROTATE 9660 (MISCELLANEOUS) IMPLANT
BONE EQUIVA 10CC (Bone Implant) ×4 IMPLANT
BRUSH SCRUB EZ PLAIN DRY (MISCELLANEOUS) ×2 IMPLANT
CANISTER SUCTION 2500CC (MISCELLANEOUS) ×2 IMPLANT
CLOTH BEACON ORANGE TIMEOUT ST (SAFETY) ×2 IMPLANT
CONT SPEC 4OZ CLIKSEAL STRL BL (MISCELLANEOUS) ×4 IMPLANT
COVER BACK TABLE 24X17X13 BIG (DRAPES) IMPLANT
COVER TABLE BACK 60X90 (DRAPES) ×2 IMPLANT
DERMABOND ADVANCED (GAUZE/BANDAGES/DRESSINGS) ×2
DERMABOND ADVANCED .7 DNX12 (GAUZE/BANDAGES/DRESSINGS) ×2 IMPLANT
DRAPE C-ARM 42X72 X-RAY (DRAPES) ×4 IMPLANT
DRAPE LAPAROTOMY 100X72X124 (DRAPES) ×2 IMPLANT
DRAPE POUCH INSTRU U-SHP 10X18 (DRAPES) ×2 IMPLANT
DRAPE SURG 17X23 STRL (DRAPES) ×4 IMPLANT
DRESSING TELFA 8X3 (GAUZE/BANDAGES/DRESSINGS) ×2 IMPLANT
ELECT REM PT RETURN 9FT ADLT (ELECTROSURGICAL) ×2
ELECTRODE REM PT RTRN 9FT ADLT (ELECTROSURGICAL) ×1 IMPLANT
EVACUATOR 1/8 PVC DRAIN (DRAIN) ×2 IMPLANT
GAUZE SPONGE 4X4 12PLY STRL LF (GAUZE/BANDAGES/DRESSINGS) ×2 IMPLANT
GAUZE SPONGE 4X4 16PLY XRAY LF (GAUZE/BANDAGES/DRESSINGS) ×2 IMPLANT
GLOVE BIOGEL PI IND STRL 7.0 (GLOVE) ×1 IMPLANT
GLOVE BIOGEL PI IND STRL 8 (GLOVE) ×1 IMPLANT
GLOVE BIOGEL PI INDICATOR 7.0 (GLOVE) ×1
GLOVE BIOGEL PI INDICATOR 8 (GLOVE) ×1
GLOVE ECLIPSE 7.5 STRL STRAW (GLOVE) ×8 IMPLANT
GLOVE INDICATOR 6.5 STRL GRN (GLOVE) ×4 IMPLANT
GLOVE SURG SS PI 6.5 STRL IVOR (GLOVE) ×2 IMPLANT
GOWN BRE IMP SLV AUR LG STRL (GOWN DISPOSABLE) ×4 IMPLANT
GOWN BRE IMP SLV AUR XL STRL (GOWN DISPOSABLE) ×2 IMPLANT
GOWN STRL REIN 2XL LVL4 (GOWN DISPOSABLE) ×2 IMPLANT
GRAFT PLIF 10X9MM (Graft) ×4 IMPLANT
KIT BASIN OR (CUSTOM PROCEDURE TRAY) ×2 IMPLANT
KIT ROOM TURNOVER OR (KITS) ×2 IMPLANT
LUCENT STRAIGHT 10X22X10 (Set) ×4 IMPLANT
MILL MEDIUM DISP (BLADE) ×2 IMPLANT
NEEDLE HYPO 22GX1.5 SAFETY (NEEDLE) ×2 IMPLANT
NS IRRIG 1000ML POUR BTL (IV SOLUTION) ×2 IMPLANT
PACK LAMINECTOMY NEURO (CUSTOM PROCEDURE TRAY) ×2 IMPLANT
PAD ARMBOARD 7.5X6 YLW CONV (MISCELLANEOUS) ×6 IMPLANT
PATTIES SURGICAL .75X.75 (GAUZE/BANDAGES/DRESSINGS) IMPLANT
ROD BENT 65MM (Rod) ×4 IMPLANT
SCREW POLY 6.5X45 (Screw) ×6 IMPLANT
SCREW POLY 6.5X45MM (Screw) ×6 IMPLANT
SPEEDLINK 11 MEDIUM (Rod) ×2 IMPLANT
SPONGE GAUZE 4X4 12PLY (GAUZE/BANDAGES/DRESSINGS) ×2 IMPLANT
SPONGE LAP 4X18 X RAY DECT (DISPOSABLE) IMPLANT
SPONGE SURGIFOAM ABS GEL 100 (HEMOSTASIS) ×2 IMPLANT
SPONGE SURGIFOAM ABS GEL SZ50 (HEMOSTASIS) ×2 IMPLANT
STAPLER SKIN PROX WIDE 3.9 (STAPLE) ×2 IMPLANT
STRIP CLOSURE SKIN 1/2X4 (GAUZE/BANDAGES/DRESSINGS) ×2 IMPLANT
SUT VIC AB 0 CT1 18XCR BRD8 (SUTURE) ×1 IMPLANT
SUT VIC AB 0 CT1 8-18 (SUTURE) ×1
SUT VIC AB 2-0 OS6 18 (SUTURE) ×6 IMPLANT
SUT VIC AB 3-0 CP2 18 (SUTURE) ×2 IMPLANT
SYR 20ML ECCENTRIC (SYRINGE) ×2 IMPLANT
TOOL FLUTED BALL 7MM (MISCELLANEOUS) ×2 IMPLANT
TOOL MATCHSTK 3MM (MISCELLANEOUS) ×2 IMPLANT
TOP CLSR SEQUOIA (Orthopedic Implant) ×12 IMPLANT
TOWEL OR 17X24 6PK STRL BLUE (TOWEL DISPOSABLE) ×2 IMPLANT
TOWEL OR 17X26 10 PK STRL BLUE (TOWEL DISPOSABLE) ×2 IMPLANT
TRAP SPECIMEN MUCOUS 40CC (MISCELLANEOUS) IMPLANT
TRAY FOLEY CATH 14FRSI W/METER (CATHETERS) ×2 IMPLANT
WATER STERILE IRR 1000ML POUR (IV SOLUTION) ×2 IMPLANT

## 2011-07-24 NOTE — Anesthesia Procedure Notes (Signed)
Procedure Name: Intubation Date/Time: 07/24/2011 7:54 AM Performed by: Elon Alas Pre-anesthesia Checklist: Emergency Drugs available, Patient identified, Timeout performed, Suction available and Patient being monitored Patient Re-evaluated:Patient Re-evaluated prior to inductionOxygen Delivery Method: Circle System Utilized Preoxygenation: Pre-oxygenation with 100% oxygen Intubation Type: IV induction and Cricoid Pressure applied Ventilation: Mask ventilation without difficulty and Oral airway inserted - appropriate to patient size Laryngoscope Size: Mac, 4, Miller and 2 Grade View: Grade III Tube type: Oral Tube size: 8.0 mm Number of attempts: 2 Airway Equipment and Method: stylet and bougie stylet Placement Confirmation: ETT inserted through vocal cords under direct vision,  positive ETCO2 and breath sounds checked- equal and bilateral Secured at: 23 cm Tube secured with: Tape Dental Injury: Teeth and Oropharynx as per pre-operative assessment  Comments: DLx1 with MAC 4 - grade 4 view, DLx1 with Miller 2 - Grade 3 view, ETT advanced atraumatically over bougie

## 2011-07-24 NOTE — Op Note (Signed)
Preop diagnosis: Spinal stenosis and herniated disc L3-4, recurrent and spinal stenosis and spondylolisthesis L4-5 Postop diagnosis: Same Procedure: L3-4 and L4-5 decompressive laminectomy with decompression of L3-L4 and L5 nerve roots more so than needed for posterior lumbar interbody fusion followed by L3-4 and L4-5 microdiscectomy followed by posterior lumbar interbody fusion L3-4 L4-5 with peek interbody spacer and bony spacer each level followed by segmental instrumentation L3-4 and 5 with Sequ[place]ia pedicle screw instrumentation followed by L3-4-5 posterolateral fusion Surgeon: Heaven Wandell Assistant: Pool  After being placed in the prone position the patient's back was prepped and draped in the usual sterile fashion. Previous lumbar incision was opened up and carried down to the spinous processes. Subperiosteal dissection was then carried out bilaterally along the spinous processes lamina and facet joint and the far lateral region to identify the transverse processes of L3-L4 and L5. Self-retaining retractor was placed for exposure and x-ray showed approach the appropriate levels. Spinous processes of L3-L4 and L5 were removed. So the patient's left side generous laminotomy was performed removing the inferior 80% of the L4 lamina the medial three quarters of the facet joint the entire L4 lamina the medial three quarters of the L4-5 facet joint and the superior one third of the L5 lamina. Decompression was carried out of the L3-L4 and L5 nerve roots more so than needed for posterior lumbar interbody fusion. Similar decompression was then carried on the patient's right side. Similar roots were decompressed on that side as well. At this time we did bilateral microdiscectomy self referred L4-5. At L3-4 on the right side, there was a very large recurrent herniated disc in this was removed in a piecemeal fashion. We then cleaned out the disc on the opposite side at L3-4 and included disc out bilaterally at L4-5. We  then placed pedicle screws L3-4 and 5 bilaterally. We used high-speed drill to make an entry point and then passed the pedicle all without difficulty. We tapped with a 55 mm tap and placed 6.5 x 45 mm screws bilaterally at all 3 levels. We held these in excellent position under fluoroscopy. We then performed interbody fusion. We distracted this disc space up to a 10 mm size which we felt was a good fit. We used a rotating cutter on the opposite side and then placed a 10 mm peek interbody spacer was filled with a mixture of autologous bone and morselized allograft. We did the same on the opposite side using the rotating cutter once again. We placed the same mixture the midline to help with interbody fusion and placed a bony spacer on the side. We did exactly the same procedure L4-5 once again using 10 mm cage and once I bone on the other side and placed a morselized allograft and bony pieces in the midline to help with interbody fusion. At this time we decorticated the far lateral region for posterolateral fusion with a mixture of autologous bone and morselized allograft. We then chosen appropriate length rod and secured to the top of the screws with a top loading nut. We then did tightening and final tightening with torque and counter torque the decompression in a downward direction. We then placed a cross-link without difficulty. We irrigated copiously controlled any bleeding with upper coagulation and Gelfoam. We left an epidural drain in the epidural space and brought out through a separate stab incision. Final fluoroscopy showed good placement of the screws rods interbody devices. We then closed the wound in multiple layers of Vicryl on the muscle fascia  subcutaneous and subcuticular tissues. Dermabond and Steri-Strips were placed on the skin. A sterile dressing was applied the patient was extubated to recovery room in stable condition.

## 2011-07-24 NOTE — Transfer of Care (Signed)
Immediate Anesthesia Transfer of Care Note  Patient: Francis Sims  Procedure(s) Performed:  POSTERIOR LUMBAR FUSION 2 LEVEL - Lumbar three-four, four-five posterior lumbar interbody fusion with pedicle screws  Patient Location: PACU  Anesthesia Type: General  Level of Consciousness: sedated  Airway & Oxygen Therapy: Patient Spontanous Breathing and Patient connected to face mask oxygen  Post-op Assessment: Report given to PACU RN and Post -op Vital signs reviewed and stable  Post vital signs: Reviewed and stable  Complications: No apparent anesthesia complications

## 2011-07-24 NOTE — Anesthesia Preprocedure Evaluation (Addendum)
Anesthesia Evaluation  Patient identified by MRN, date of birth, ID band Patient awake    Reviewed: Allergy & Precautions, H&P , NPO status , Patient's Chart, lab work & pertinent test results  History of Anesthesia Complications (+) PONV  Airway Mallampati: II TM Distance: >3 FB Neck ROM: Full    Dental  (+) Dental Advisory Given and Teeth Intact   Pulmonary neg pulmonary ROS, former smoker         Cardiovascular     Neuro/Psych PSYCHIATRIC DISORDERS Anxiety Chronic back pain Negative Neurological ROS     GI/Hepatic Neg liver ROS, GERD-  Controlled,  Endo/Other  Negative Endocrine ROS  Renal/GU negative Renal ROS     Musculoskeletal   Abdominal   Peds  Hematology negative hematology ROS (+)   Anesthesia Other Findings   Reproductive/Obstetrics                         Anesthesia Physical Anesthesia Plan  ASA: II  Anesthesia Plan: General   Post-op Pain Management:    Induction: Intravenous  Airway Management Planned: Oral ETT  Additional Equipment:   Intra-op Plan:   Post-operative Plan: Extubation in OR  Informed Consent: I have reviewed the patients History and Physical, chart, labs and discussed the procedure including the risks, benefits and alternatives for the proposed anesthesia with the patient or authorized representative who has indicated his/her understanding and acceptance.   Dental advisory given  Plan Discussed with: CRNA, Anesthesiologist and Surgeon  Anesthesia Plan Comments:         Anesthesia Quick Evaluation

## 2011-07-24 NOTE — Brief Op Note (Signed)
07/24/2011  12:37 PM  PATIENT:  Francis Sims  71 y.o. male  PRE-OPERATIVE DIAGNOSIS:  Lumbar three-four, four-five instability / stenosis  POST-OPERATIVE DIAGNOSIS:  Lumbar three-four, four-five instability / stenosis  PROCEDURE:  Procedure(s): POSTERIOR LUMBAR FUSION 2 LEVEL  SURGEON:  Surgeon(s): Reinaldo Meeker, MD Temple Pacini, MD  PHYSICIAN ASSISTANT:   ASSISTANTS: Pool   ANESTHESIA:   general  EBL:  Total I/O In: 2885 [I.V.:2200; Blood:185; IV Piggyback:500] Out: 1350 [Urine:800; Blood:550]  BLOOD ADMINISTERED:185 CC CELLSAVER  DRAINS: (medium) Hemovact drain(s) in the epidural with  Suction Open   LOCAL MEDICATIONS USED:  MARCAINE 20CC  SPECIMEN:  No Specimen  DISPOSITION OF SPECIMEN:  N/A  COUNTS:  YES  TOURNIQUET:  * No tourniquets in log *  DICTATION: .Dragon Dictation  PLAN OF CARE: Admit to inpatient   PATIENT DISPOSITION:  PACU - hemodynamically stable.   Delay start of Pharmacological VTE agent (>24hrs) due to surgical blood loss or risk of bleeding:  {YES/NO/NOT APPLICABLE:20182

## 2011-07-24 NOTE — Preoperative (Signed)
Beta Blockers   Reason not to administer Beta Blockers:Not Applicable, pt not on beta blocker 

## 2011-07-24 NOTE — Progress Notes (Signed)
ANTIBIOTIC CONSULT NOTE - INITIAL  Pharmacy Consult for vancomycin Indication: post op prophylaxis  Allergies  Allergen Reactions  . Penicillins Hives and Swelling  . Sulfonamide Derivatives Other (See Comments)    Almost stopped heart in the service..Also, causes hives  . Ibuprofen Itching    Patient Measurements: Height: 6\' 2"  (188 cm) Weight: 218 lb (98.884 kg) IBW/kg (Calculated) : 82.2    Vital Signs: Temp: 97.4 F (36.3 C) (02/08 1453) Temp src: Oral (02/08 0615) BP: 148/71 mmHg (02/08 1453) Pulse Rate: 96  (02/08 1453) Intake/Output from previous day:   Intake/Output from this shift: Total I/O In: 3385 [I.V.:2200; Blood:185; Other:500; IV Piggyback:500] Out: 1350 [Urine:800; Blood:550]  Labs:  Basename 07/22/11 1449  WBC 12.8*  HGB 16.7  PLT 229  LABCREA --  CREATININE --   Estimated Creatinine Clearance: 86.3 ml/min (by C-G formula based on Cr of 1.03). No results found for this basename: VANCOTROUGH:2,VANCOPEAK:2,VANCORANDOM:2,GENTTROUGH:2,GENTPEAK:2,GENTRANDOM:2,TOBRATROUGH:2,TOBRAPEAK:2,TOBRARND:2,AMIKACINPEAK:2,AMIKACINTROU:2,AMIKACIN:2, in the last 72 hours   Microbiology: Recent Results (from the past 720 hour(s))  SURGICAL PCR SCREEN     Status: Normal   Collection Time   07/22/11  2:36 PM      Component Value Range Status Comment   MRSA, PCR NEGATIVE  NEGATIVE  Final    Staphylococcus aureus NEGATIVE  NEGATIVE  Final     Medical History: Past Medical History  Diagnosis Date  . Chronic back pain     pinched nerve;buldging disc  . Dry skin     on elbows;uses vasaline and it goes away  . Burping     per pt lots of burping  . Hemorrhoids   . Hx of colonic polyps     46yrs ago  . Nocturia   . PONV (postoperative nausea and vomiting)   . GERD (gastroesophageal reflux disease)     OTC    Medications:  Prescriptions prior to admission  Medication Sig Dispense Refill  . aspirin 81 MG chewable tablet Chew 81 mg by mouth daily.      .  cholecalciferol (VITAMIN D) 1000 UNITS tablet Take 1,000 Units by mouth daily.        . diazepam (VALIUM) 5 MG tablet Take 5 mg by mouth at bedtime.      Marland Kitchen HYDROcodone-acetaminophen (NORCO) 5-325 MG per tablet Take 1 tablet by mouth every 6 (six) hours as needed. For pain      . Omega-3 Fatty Acids (FISH OIL) 1000 MG CAPS Take 1 capsule by mouth 2 (two) times daily.         Assessment: 71 yo man to receive vancomycin post op for 1 dose or to continue if drain is still in.  He received 500 mg vancomycin preop.  Goal of Therapy:  Vancomycin trough level 10-15 mcg/ml  Plan:  Vancomycin 1 gm IV q12 hours.  D/C when drain dced.  F/u renal function, clinical course.  Talbert Cage Poteet 07/24/2011,3:24 PM

## 2011-07-24 NOTE — H&P (Signed)
Francis Sims is an 71 y.o. male.   Chief Complaint: Back and bilateral leg pain HPI: The patient is 71 year old gentleman who presented initially with back and right leg pain. He was found to have a large herniated disc L3-4 with marked stenosis. He was also noted to have a grade 1 spondylolisthesis at L4-5 but we felt it is was not clinically significant at that time. He underwent a right L3-4 microdiscectomy and initially did well. He did however began to develop bilateral buttock and leg pain which is actually worse on the left side. He was tried a right conservative therapies without improvement. He underwent myelography which showed recurrent abnormality L3-4 on the right but there was also more significant stenosis at L4-5 from his listhesis and would have expected. After discussing the options the patient requested surgical intervention. I had a long discussion with him regarding the risks and benefits of surgical intervention. The risks discussed include but are not limited to bleeding infection weakness numbness paralysis spinal fluid leakage nonunion trouble with instrumentation and death. We've discussed alternative methods of therapy along with the risks and benefits of nonintervention. The patient has had the opportunity numerous questions and appears to understand. With this information in hand he has requested that we proceed with surgery.  Past Medical History  Diagnosis Date  . Chronic back pain     pinched nerve;buldging disc  . Dry skin     on elbows;uses vasaline and it goes away  . Burping     per pt lots of burping  . Hemorrhoids   . Hx of colonic polyps     65yrs ago  . Nocturia   . PONV (postoperative nausea and vomiting)   . GERD (gastroesophageal reflux disease)     OTC    Past Surgical History  Procedure Date  . Cervical fusion 2000    C5,6,7  . Hernia repair 1949  . Colonoscopy   . Cataract extraction 2008    bilateral  . Lumbar laminectomy/decompression  microdiscectomy 05/26/2011    Procedure: LUMBAR LAMINECTOMY/DECOMPRESSION MICRODISCECTOMY;  Surgeon: Rolanda Lundborg Angelia Hazell;  Location: [facility];  Service: Neurosurgery;  Laterality: Right;  Right Lumbar three-four Microdiskectomy  . Back surgery   . Eye surgery   . Tonsillectomy     Family History  Problem Relation Age of Onset  . COPD Other   . Anesthesia problems Neg Hx   . Hypotension Neg Hx   . Malignant hyperthermia Neg Hx   . Pseudochol deficiency Neg Hx    Social History:  reports that he has quit smoking. He does not have any smokeless tobacco history on file. He reports that he does not drink alcohol or use illicit drugs.  Allergies:  Allergies  Allergen Reactions  . Penicillins Hives and Swelling  . Sulfonamide Derivatives Other (See Comments)    Almost stopped heart in the service..Also, causes hives  . Ibuprofen Itching    Medications Prior to Admission  Medication Dose Route Frequency Provider Last Rate Last Dose  . bacitracin 40981 UNITS injection           . dexamethasone (DECADRON) 10 MG/ML injection           . dexamethasone (DECADRON) injection 10 mg  10 mg Intravenous Once Reinaldo Meeker, MD      . diphenhydrAMINE (BENADRYL) 50 MG/ML injection           . diphenhydrAMINE (BENADRYL) injection 50 mg  50 mg Intravenous Once Reinaldo Meeker,  MD      . HYDROmorphone (DILAUDID) injection 0.25-0.5 mg  0.25-0.5 mg Intravenous Q5 min PRN Judie Petit, MD      . meperidine (DEMEROL) injection 6.25-12.5 mg  6.25-12.5 mg Intravenous Q5 min PRN Judie Petit, MD      . morphine 2 MG/ML injection 5 mg  5 mg Intravenous Q10 min PRN Judie Petit, MD      . promethazine (PHENERGAN) injection 6.25-12.5 mg  6.25-12.5 mg Intravenous Q15 min PRN Judie Petit, MD      . sodium chloride 0.9 % infusion           . tobramycin (NEBCIN) 80 mg in dextrose 5 % 50 mL IVPB  80 mg Intravenous Once Reinaldo Meeker, MD      . vancomycin (VANCOCIN) 500 mg in sodium chloride 0.9 %  100 mL IVPB  500 mg Intravenous Once Reinaldo Meeker, MD       Medications Prior to Admission  Medication Sig Dispense Refill  . cholecalciferol (VITAMIN D) 1000 UNITS tablet Take 1,000 Units by mouth daily.        . Omega-3 Fatty Acids (FISH OIL) 1000 MG CAPS Take 1 capsule by mouth 2 (two) times daily.          Results for orders placed during the hospital encounter of 07/22/11 (from the past 48 hour(s))  SURGICAL PCR SCREEN     Status: Normal   Collection Time   07/22/11  2:36 PM      Component Value Range Comment   MRSA, PCR NEGATIVE  NEGATIVE     Staphylococcus aureus NEGATIVE  NEGATIVE    TYPE AND SCREEN     Status: Normal   Collection Time   07/22/11  2:47 PM      Component Value Range Comment   ABO/RH(D) O POS      Antibody Screen NEG      Sample Expiration 08/05/2011     ABO/RH     Status: Normal   Collection Time   07/22/11  2:47 PM      Component Value Range Comment   ABO/RH(D) O POS     CBC     Status: Abnormal   Collection Time   07/22/11  2:49 PM      Component Value Range Comment   WBC 12.8 (*) 4.0 - 10.5 (K/uL)    RBC 5.61  4.22 - 5.81 (MIL/uL)    Hemoglobin 16.7  13.0 - 17.0 (g/dL)    HCT 40.9  81.1 - 91.4 (%)    MCV 85.0  78.0 - 100.0 (fL)    MCH 29.8  26.0 - 34.0 (pg)    MCHC 35.0  30.0 - 36.0 (g/dL)    RDW 78.2  95.6 - 21.3 (%)    Platelets 229  150 - 400 (K/uL)    No results found.  Pertinent items are noted in HPI.  Blood pressure 148/80, pulse 66, temperature 98.1 F (36.7 C), temperature source Oral, resp. rate 16, SpO2 96.00%.  The patient has a well-healed incision in his lumbar spine. Strength is 5 over 5. Sensation is intact to light touch. Assessment/Plan Impression is that of a recurrent abnormality L3-4 and spondylolisthesis L4-5. The plan is for a two-level posterior lumbar interbody fusion with with pedicle screw fixation.  Reinaldo Meeker, MD 07/24/2011, 7:34 AM

## 2011-07-24 NOTE — Anesthesia Postprocedure Evaluation (Signed)
  Anesthesia Post-op Note  Patient: Francis Sims  Procedure(s) Performed:  POSTERIOR LUMBAR FUSION 2 LEVEL - Lumbar three-four, four-five posterior lumbar interbody fusion with pedicle screws  Patient Location: PACU  Anesthesia Type: General  Level of Consciousness: awake  Airway and Oxygen Therapy: Patient Spontanous Breathing  Post-op Pain: mild  Post-op Assessment: Post-op Vital signs reviewed  Post-op Vital Signs: stable  Complications: No apparent anesthesia complications

## 2011-07-25 NOTE — Progress Notes (Signed)
Postop day 1. Patient's back pain. Lower extremity a pain much improved. Denies numbness paresthesias or weakness.  Afebrile. Vitals normal. Urine output good. Drain output moderately  high. Motor exam intact. Sensory exam normal. Chest and abdomen benign. Dressing dry.  Progressing well following 2 level lumbar decompression and fusion. Plan to mobilize with therapy.

## 2011-07-26 NOTE — Progress Notes (Signed)
Postop day 2. Back pain well controlled. No lower extremity a pain. Denies numbness or weakness. Still feels unsteady on his feet. Still requiring his walker. Patient reports feeling woozy on his current medications.  Afebrile with stable vitals. Urine output good. Hemovac output much less. Motor and sensory exam stable. The wound healing well.  Progressing well following lumbar decompression and fusion surgery. We will have patient work with physical therapy. Possible discharge tomorrow depending on patient's mobility level and pain control.

## 2011-07-26 NOTE — Progress Notes (Signed)
Physical Therapy Evaluation Patient Details Name: Francis Sims MRN: [id number] DOB: 02-15-1941 Today's Date: 07/26/2011  Problem List:  Patient Active Problem List  Diagnoses  . LYMPHADENITIS  . CAROTID ARTERY STENOSIS  . PERIPHERAL VASCULAR DISEASE  . HEMORRHOIDS, NOS  . PHARYNGITIS  . HEMATOCHEZIA  . ERECTILE DYSFUNCTION, ORGANIC  . LEG PAIN  . ABDOMINAL PAIN  . TOBACCO USE, QUIT  . ONYCHOMYCOSIS  . BALANITIS  . ECZEMA  . PARESTHESIA  . Lumbar radiculitis    Past Medical History:  Past Medical History  Diagnosis Date  . Chronic back pain     pinched nerve;buldging disc  . Dry skin     on elbows;uses vasaline and it goes away  . Burping     per pt lots of burping  . Hemorrhoids   . Hx of colonic polyps     66yrs ago  . Nocturia   . PONV (postoperative nausea and vomiting)   . GERD (gastroesophageal reflux disease)     OTC   Past Surgical History:  Past Surgical History  Procedure Date  . Cervical fusion 2000    C5,6,7  . Hernia repair 1949  . Colonoscopy   . Cataract extraction 2008    bilateral  . Lumbar laminectomy/decompression microdiscectomy 05/26/2011    Procedure: LUMBAR LAMINECTOMY/DECOMPRESSION MICRODISCECTOMY;  Surgeon: Rolanda Lundborg Kritzer;  Location: [facility];  Service: Neurosurgery;  Laterality: Right;  Right Lumbar three-four Microdiskectomy  . Back surgery   . Eye surgery   . Tonsillectomy     PT Assessment/Plan/Recommendation PT Assessment Clinical Impression Statement: Pt presents with a medical diagnosis of lumbar fusion along with the following impairments/deficits and therapy diagnosis listed below. Pt requires increased cueing for safety awareness throughout treatment. Pt will benefit from skilled PT in the acute care setting in order to maximize functional mobility for a safe d/c home PT Recommendation/Assessment: Patient will need skilled PT in the acute care venue PT Problem List: Decreased balance;Decreased mobility;Decreased  knowledge of use of DME;Decreased safety awareness;Decreased knowledge of precautions;Pain PT Therapy Diagnosis : Difficulty walking;Acute pain PT Plan PT Frequency: Min 5X/week PT Treatment/Interventions: DME instruction;Gait training;Stair training;Functional mobility training;Therapeutic activities;Patient/family education PT Recommendation Follow Up Recommendations: Home health PT;Supervision/Assistance - 24 hour Equipment Recommended: None recommended by PT PT Goals  Acute Rehab PT Goals PT Goal Formulation: With patient/family Time For Goal Achievement: 7 days Pt will go Supine/Side to Sit: with modified independence PT Goal: Supine/Side to Sit - Progress: Goal set today Pt will go Sit to Supine/Side: with modified independence PT Goal: Sit to Supine/Side - Progress: Goal set today Pt will go Sit to Stand: with modified independence PT Goal: Sit to Stand - Progress: Goal set today Pt will go Stand to Sit: with modified independence PT Goal: Stand to Sit - Progress: Goal set today Pt will Transfer Bed to Chair/Chair to Bed: with supervision PT Transfer Goal: Bed to Chair/Chair to Bed - Progress: Goal set today Pt will Ambulate: >150 feet;with supervision;with least restrictive assistive device PT Goal: Ambulate - Progress: Goal set today Pt will Go Up / Down Stairs: Flight;with supervision;with rail(s) PT Goal: Up/Down Stairs - Progress: Goal set today  PT Evaluation Precautions/Restrictions  Precautions Precautions: Back Precaution Comments: Pt and wife educated on 3/3 back precautions. Pt was able to maintain with cueing throughout session Required Braces or Orthoses: Yes Spinal Brace: Lumbar corset Restrictions Weight Bearing Restrictions: No Prior Functioning  Home Living Lives With: Spouse Receives Help From: Family Type of Home:  House Home Layout: Two level Alternate Level Stairs-Rails: Right Alternate Level Stairs-Number of Steps: 15 Home Access: Stairs to  enter Entrance Stairs-Rails: Right Entrance Stairs-Number of Steps: 4 Bathroom Shower/Tub: Walk-in shower;Door [employer] Toilet: Standard Bathroom Accessibility: Yes How Accessible: Accessible via walker Home Adaptive Equipment: Walker - rolling;Bedside commode/3-in-1 Prior Function Level of Independence: Independent with basic ADLs;Independent with gait;Independent with transfers (hasn't been able to walk since 1st surgery; independent prio) Able to Take Stairs?: Yes Driving: Yes Vocation: Retired Leisure: Hobbies-yes (Comment) Comments: tennis, bicycle riding, golf Cognition Cognition Arousal/Alertness: Awake/alert Overall Cognitive Status: Appears within functional limits for tasks assessed Orientation Level: Oriented X4 Sensation/Coordination Sensation Light Touch: Appears Intact Extremity Assessment RLE Assessment RLE Assessment: Within Functional Limits LLE Assessment LLE Assessment: Within Functional Limits Mobility (including Balance) Bed Mobility Bed Mobility: Yes Rolling Right: 5: Supervision Rolling Right Details (indicate cue type and reason): Pt progressed to rolling without physical assist with practice. VC for proper sequencing Rolling Left: 5: Supervision Rolling Left Details (indicate cue type and reason): Pt progressed to rolling without physical assist with practice. VC for proper sequencing Left Sidelying to Sit: 5: Supervision;HOB flat Left Sidelying to Sit Details (indicate cue type and reason): Pt progressed with practice(3 times) to completing without physical assist. VC for sequencing and technique to maintain back precautions Sitting - Scoot to Edge of Bed: 6: Modified independent (Device/Increase time) Sit to Supine: 5: Supervision;HOB flat Sit to Supine - Details (indicate cue type and reason): Pt progressed with practice(3 times) to complete without physical assist. VC throughout for sequencing and technique to maintain back precautions during  transfer Transfers Transfers: Yes Sit to Stand: 4: Min assist;With upper extremity assist;From bed Sit to Stand Details (indicate cue type and reason): VC for hand placement and safety to RW. Min assist for stability into standing Stand to Sit: 4: Min assist;With upper extremity assist;To bed Stand to Sit Details: VC for hand placement for safety sitting. Min assist to control descent Ambulation/Gait Ambulation/Gait: Yes Ambulation/Gait Assistance: 4: Min assist (Minguard to min assist) Ambulation/Gait Assistance Details (indicate cue type and reason): Min assist for loss of balance to the left one time in which pt was able to maintain with UE assist on walker. Minguard for majority of ambulation with cues throughout for distance to RW safety as well as postural cues Ambulation Distance (Feet): 200 Feet Assistive device: Rolling walker Gait Pattern: Step-to pattern;Decreased stride length;Trunk flexed;Shuffle Gait velocity: Decreased gait speed    Exercise    End of Session PT - End of Session Equipment Utilized During Treatment: Gait belt;Back brace Activity Tolerance: Patient tolerated treatment well Patient left: in bed;with call bell in reach;with family/visitor present Nurse Communication: Mobility status for transfers;Mobility status for ambulation General Behavior During Session: [facility] for tasks performed Cognition: [facility] for tasks performed  Milana Kidney 07/26/2011, 3:40 PM  07/26/2011 Milana Kidney DPT PAGER: [phone] OFFICE: [phone]

## 2011-07-27 MED ORDER — HYDROCODONE-ACETAMINOPHEN 5-325 MG PO TABS: 1.0000 | ORAL_TABLET | ORAL | Status: AC | PRN

## 2011-07-27 MED ORDER — DIAZEPAM 5 MG PO TABS: 5.0000 mg | ORAL_TABLET | Freq: Four times a day (QID) | ORAL | Status: AC | PRN

## 2011-07-27 NOTE — Progress Notes (Signed)
Utilization review completed. Worth Kober, RN, BSN. 07/27/11  

## 2011-07-27 NOTE — Discharge Summary (Signed)
Physician Discharge Summary  Patient ID: Francis Sims MRN: [id number] DOB/AGE: 1940/09/29 71 y.o.  Admit date: 07/24/2011 Discharge date: 07/27/2011  Admission Diagnoses:  Discharge Diagnoses:  Active Problems:  * No active hospital problems. *    Discharged Condition: good  Hospital Course: Had two-level fusion and did well. By the third day postop was up ambulating well and tolerating a regular diet. His wound looked excellent and was felt that he be discharged home.  Consults: None  Significant Diagnostic Studies: None  Treatments: L3-4 and L4-5 plif with pedicle screws  Discharge Exam: Blood pressure 159/91, pulse 69, temperature 97.4 F (36.3 C), temperature source Axillary, resp. rate 20, height 6\' 2"  (1.88 m), weight 98.884 kg (218 lb), SpO2 93.00%. Incision/Wound: healing well with no discharge  Disposition: Home or Self Care  Discharge Orders    Future Orders Please Complete By Expires   Diet general      Discharge instructions      Comments:   Mostly bedrest. Get up 9 or 10 times each day and walk for 15-20 minutes each time. Very little sitting the first week. No riding in the car until your first post op appointment. If you had neck surgery...may shower from the chest down. If you had low back surgery....you may shower with a saran wrap covering over the incision. Take your pain medicine as needed...and other medicines that you are instructed to take. Call for an appointment...[phone].   Call MD for:  temperature >100.4      Call MD for:  persistant nausea and vomiting      Call MD for:  severe uncontrolled pain      Call MD for:  redness, tenderness, or signs of infection (pain, swelling, redness, odor or green/yellow discharge around incision site)      Call MD for:  difficulty breathing, headache or visual disturbances      Call MD for:  hives        Medication List  As of 07/27/2011  9:42 AM   STOP taking these medications         aspirin 81 MG  chewable tablet         TAKE these medications         cholecalciferol 1000 UNITS tablet   Commonly known as: VITAMIN D   Take 1,000 Units by mouth daily.      diazepam 5 MG tablet   Commonly known as: VALIUM   Take 5 mg by mouth at bedtime.      diazepam 5 MG tablet   Commonly known as: VALIUM   Take 1 tablet (5 mg total) by mouth every 6 (six) hours as needed.      Fish Oil 1000 MG Caps   Take 1 capsule by mouth 2 (two) times daily.      HYDROcodone-acetaminophen 5-325 MG per tablet   Commonly known as: NORCO   Take 1 tablet by mouth every 6 (six) hours as needed. For pain      HYDROcodone-acetaminophen 5-325 MG per tablet   Commonly known as: NORCO   Take 1-2 tablets by mouth every 4 (four) hours as needed.             At home rest most of the time. Get up 9 or 10 times each day and take a 15 or 20 minute walk. No riding in the car and to your first postoperative appointment. If you have neck surgery you may shower from the chest down starting  on the third postoperative day. If you had back surgery he may start showering on the third postoperative day with saran wrap wrapped around your incisional area 3 times. After the shower remove the saran wrap. Take pain medicine as needed and other medications as instructed. Call my office for an appointment.  SignedReinaldo Meeker, MD 07/27/2011, 9:42 AM

## 2011-07-27 NOTE — Progress Notes (Signed)
Physical Therapy Treatment Patient Details Name: Francis Sims MRN: [id number] DOB: 02-Apr-1941 Today's Date: 07/27/2011  PT Assessment/Plan  PT - Assessment/Plan Comments on Treatment Session: Pt progressing well with PT goals.  Eager to d/c home.  Did well on stairs today.   PT Plan: Discharge plan remains appropriate PT Frequency: Min 5X/week Follow Up Recommendations: Home health PT;Supervision/Assistance - 24 hour Equipment Recommended: None recommended by PT PT Goals  Acute Rehab PT Goals PT Goal: Sit to Stand - Progress: Progressing toward goal PT Goal: Stand to Sit - Progress: Met PT Goal: Ambulate - Progress: Progressing toward goal PT Goal: Up/Down Stairs - Progress: Progressing toward goal  PT Treatment Precautions/Restrictions  Precautions Precautions: Back Precaution Comments: Pt and wife educated on 3/3 back precautions. Pt was able to maintain with cueing throughout session Required Braces or Orthoses: Yes Spinal Brace: Lumbar corset;Applied in sitting position Restrictions Weight Bearing Restrictions: No Mobility (including Balance) Bed Mobility Rolling Left: 6: Modified independent (Device/Increase time) Left Sidelying to Sit: 6: Modified independent (Device/Increase time);HOB flat Sitting - Scoot to Edge of Bed: 6: Modified independent (Device/Increase time) Transfers Sit to Stand: 5: Supervision;From bed Sit to Stand Details (indicate cue type and reason): Cues for hand placement & to scoot hips closer to edge of surface before standing  Stand to Sit: 6: Modified independent (Device/Increase time) Ambulation/Gait Ambulation/Gait Assistance: 5: Supervision Ambulation/Gait Assistance Details (indicate cue type and reason): cues to stay inside RW, upright posture, & to increase stride length & step height.   Ambulation Distance (Feet): 200 Feet Assistive device: Rolling walker Gait Pattern: Step-through pattern;Decreased stride length Stairs: Yes Stairs  Assistance: 4: Min assist;Other (comment) Tree surgeon) Stairs Assistance Details (indicate cue type and reason): Min Assist for balance & UE support during forwards technique using 1 rail & Min Guard Assist with sideways technique.  Cues for proper technique.   Stair Management Technique: Step to pattern;One rail Right;Sideways;Forwards Number of Stairs: 3  (2 trials. ) Wheelchair Mobility Wheelchair Mobility: No  Posture/Postural Control Posture/Postural Control: No significant limitations Exercise    End of Session PT - End of Session Equipment Utilized During Treatment: Back brace Activity Tolerance: Patient tolerated treatment well Patient left: in chair;with call bell in reach;with family/visitor present General Behavior During Session: [facility] for tasks performed Cognition: [facility] for tasks performed  Lara Mulch 07/27/2011, 11:44 AM [phone]

## 2011-07-27 NOTE — Evaluation (Signed)
Occupational Therapy Evaluation Patient Details Name: Francis Sims MRN: [id number] DOB: [birth year] Today's Date: 07/27/2011  Problem List:  Patient Active Problem List  Diagnoses  . LYMPHADENITIS  . CAROTID ARTERY STENOSIS  . PERIPHERAL VASCULAR DISEASE  . HEMORRHOIDS, NOS  . PHARYNGITIS  . HEMATOCHEZIA  . ERECTILE DYSFUNCTION, ORGANIC  . LEG PAIN  . ABDOMINAL PAIN  . TOBACCO USE, QUIT  . ONYCHOMYCOSIS  . BALANITIS  . ECZEMA  . PARESTHESIA  . Lumbar radiculitis    Past Medical History:  Past Medical History  Diagnosis Date  . Chronic back pain     pinched nerve;buldging disc  . Dry skin     on elbows;uses vasaline and it goes away  . Burping     per pt lots of burping  . Hemorrhoids   . Hx of colonic polyps     54yrs ago  . Nocturia   . PONV (postoperative nausea and vomiting)   . GERD (gastroesophageal reflux disease)     OTC   Past Surgical History:  Past Surgical History  Procedure Date  . Cervical fusion 2000    C5,6,7  . Hernia repair 1949  . Colonoscopy   . Cataract extraction 2008    bilateral  . Lumbar laminectomy/decompression microdiscectomy 05/26/2011    Procedure: LUMBAR LAMINECTOMY/DECOMPRESSION MICRODISCECTOMY;  Surgeon: Rolanda Lundborg Kritzer;  Location: [facility];  Service: Neurosurgery;  Laterality: Right;  Right Lumbar three-four Microdiskectomy  . Back surgery   . Eye surgery   . Tonsillectomy     OT Assessment/Plan/Recommendation OT Assessment Clinical Impression Statement: 71 yo male s/p L3-4 and L4-5 decompressive laminectomy with decompression of L3-L4 and L5 nerve roots more so than needed for posterior lumbar interbody fusion followed by L3-4 and L4-5 microdiscectomy followed by posterior lumbar interbody fusion L3-4 L4-5 with peek interbody spacer and bony spacer each level followed by segmental instrumentation L3-4 and 5 with [place] pedicle screw instrumentation followed by L3-4-5 posterolateral fusion. Pt is at adequate level to  d/c home with no further acute needs. OT Recommendation/Assessment: Patient does not need any further OT services OT Recommendation Follow Up Recommendations: No OT follow up Equipment Recommended: None recommended by OT OT Goals    OT Evaluation Precautions/Restrictions  Precautions Precautions: Back Precaution Comments: Pt and wife educated on 3/3 back precautions. Pt was able to maintain with cueing throughout session Required Braces or Orthoses: Yes Spinal Brace: Lumbar corset;Applied in sitting position Restrictions Weight Bearing Restrictions: No Prior Functioning Home Living Lives With: Spouse Receives Help From: Family Type of Home: House Home Layout: Two level Alternate Level Stairs-Rails: Right Alternate Level Stairs-Number of Steps: 15 Home Access: Stairs to enter Entrance Stairs-Rails: Right Entrance Stairs-Number of Steps: 4 Bathroom Shower/Tub: Psychologist, counselling;Door [employer] Toilet: Standard Bathroom Accessibility: Yes How Accessible: Accessible via walker Home Adaptive Equipment: Walker - rolling;Bedside commode/3-in-1 Prior Function Level of Independence: Independent with basic ADLs;Independent with gait;Independent with transfers Able to Take Stairs?: Yes Driving: Yes Vocation: Retired Leisure: Hobbies-yes (Comment) Comments: tennis bike riding golf ADL ADL Grooming: Performed;Wash/dry hands;Modified independent Where Assessed - Grooming: Standing at sink Upper Body Dressing: Performed;Modified independent Where Assessed - Upper Body Dressing: Sitting, chair;Supported Lower Body Dressing: Performed;Modified independent (mod v/c ) Where Assessed - Lower Body Dressing: Sit to stand from chair Toilet Transfer: Performed;Modified independent Toilet Transfer Method: Proofreader: Regular height toilet Toileting - Clothing Manipulation: Performed;Modified independent Where Assessed - Toileting Clothing Manipulation: Sit to stand  from 3-in-1 or toilet Toileting - Hygiene:  Performed;Modified independent Where Assessed - Toileting Hygiene: Sit to stand from 3-in-1 or toilet Tub/Shower Transfer: Performed;Supervision/safety Tub/Shower Transfer Method: Science writer: Walk in shower Equipment Used: Rolling walker Ambulation Related to ADLs: Pt ambulating in room with Mod I ADL Comments: Pt educated on shower transfer, in out of car transfer, ADLs with back precautions, don doff brace, correct position of brace, bed mobility and building up activity tolerance by preforming ADls with minimal wife (A). Pt's wife agreeable to (A) less with d/c home  Vision/Perception    Cognition Cognition Arousal/Alertness: Awake/alert Overall Cognitive Status: Appears within functional limits for tasks assessed Orientation Level: Oriented X4 Sensation/Coordination Coordination Gross Motor Movements are Fluid and Coordinated: Yes Fine Motor Movements are Fluid and Coordinated: Yes Extremity Assessment RUE Assessment RUE Assessment: Within Functional Limits (grossly) LUE Assessment LUE Assessment: Within Functional Limits (grossly) Mobility  Bed Mobility Bed Mobility: No Rolling Left: 6: Modified independent (Device/Increase time) Left Sidelying to Sit: 6: Modified independent (Device/Increase time);HOB flat Sitting - Scoot to Edge of Bed: 6: Modified independent (Device/Increase time) Transfers Transfers: Yes Sit to Stand: 5: Supervision;With upper extremity assist;With armrests;From chair/3-in-1 Sit to Stand Details (indicate cue type and reason): Cues for hand placement & to scoot hips closer to edge of surface before standing  Stand to Sit: 5: Supervision;With armrests;With upper extremity assist;To chair/3-in-1 Exercises   End of Session OT - End of Session Equipment Utilized During Treatment: Gait belt;Back brace Activity Tolerance: Patient tolerated treatment well Patient left: in chair;with  call bell in reach;with family/visitor present (wife) Nurse Communication: Mobility status for transfers General Behavior During Session: [facility] for tasks performed Cognition: [facility] for tasks performed   Lucile Shutters 07/27/2011, 12:55 PM   Pager: [phone]

## 2011-07-29 MED FILL — Sodium Chloride Irrigation Soln 0.9%: Qty: 3000 | Status: AC

## 2011-07-29 MED FILL — Heparin Sodium (Porcine) Inj 1000 Unit/ML: INTRAMUSCULAR | Qty: 30 | Status: AC

## 2011-07-29 MED FILL — Sodium Chloride IV Soln 0.9%: INTRAVENOUS | Qty: 1000 | Status: AC

## 2011-09-02 ENCOUNTER — Ambulatory Visit
Admission: RE | Admit: 2011-09-02 | Discharge: 2011-09-02 | Disposition: A | Discharge status: Home / Self Care | Payer: Medicare Other | Source: Ambulatory Visit | Attending: Neurosurgery | Admitting: Neurosurgery

## 2011-09-02 ENCOUNTER — Other Ambulatory Visit: Payer: Self-pay | Admitting: Neurosurgery

## 2011-09-02 DIAGNOSIS — M545 Low back pain, unspecified: Secondary | ICD-10-CM

## 2011-09-16 ENCOUNTER — Encounter (INDEPENDENT_AMBULATORY_CARE_PROVIDER_SITE_OTHER): Payer: Medicare Other

## 2011-09-16 DIAGNOSIS — R0989 Other specified symptoms and signs involving the circulatory and respiratory systems: Secondary | ICD-10-CM

## 2011-09-16 DIAGNOSIS — I6529 Occlusion and stenosis of unspecified carotid artery: Secondary | ICD-10-CM

## 2012-02-03 ENCOUNTER — Telehealth: Payer: Self-pay | Admitting: *Deleted

## 2012-02-03 DIAGNOSIS — Z0389 Encounter for observation for other suspected diseases and conditions ruled out: Secondary | ICD-10-CM

## 2012-02-03 DIAGNOSIS — Z0000 Encounter for general adult medical examination without abnormal findings: Secondary | ICD-10-CM

## 2012-02-03 NOTE — Telephone Encounter (Signed)
CPE labs entered.  

## 2012-02-03 NOTE — Telephone Encounter (Signed)
Message copied by Merrilyn Puma on Wed Feb 03, 2012  9:17 AM ------      Message from: COUSIN, SHARON T      Created: Thu Jan 28, 2012  3:21 PM      Regarding: [facility] DATE   04/22/12       THANKS

## 2012-02-08 ENCOUNTER — Telehealth: Payer: Self-pay

## 2012-02-08 MED ORDER — VARDENAFIL HCL 20 MG PO TABS: 10.0000 mg | ORAL_TABLET | Freq: Every day | ORAL | Status: DC | PRN

## 2012-02-08 NOTE — Telephone Encounter (Signed)
Pt called requesting to pick up hard copy Rx for Levitra 20 mg, please advise.

## 2012-02-08 NOTE — Telephone Encounter (Signed)
Ok Thx 

## 2012-02-09 MED ORDER — VARDENAFIL HCL 20 MG PO TABS: 10.0000 mg | ORAL_TABLET | Freq: Every day | ORAL | Status: DC | PRN

## 2012-02-09 NOTE — Telephone Encounter (Signed)
Pt informed via VM, Rx in cabinet for pt pick up  

## 2012-02-18 DIAGNOSIS — H33109 Unspecified retinoschisis, unspecified eye: Secondary | ICD-10-CM | POA: Insufficient documentation

## 2012-02-18 DIAGNOSIS — H43819 Vitreous degeneration, unspecified eye: Secondary | ICD-10-CM | POA: Insufficient documentation

## 2012-02-18 DIAGNOSIS — Z9889 Other specified postprocedural states: Secondary | ICD-10-CM | POA: Insufficient documentation

## 2012-04-18 ENCOUNTER — Other Ambulatory Visit (INDEPENDENT_AMBULATORY_CARE_PROVIDER_SITE_OTHER): Payer: Medicare Other

## 2012-04-18 DIAGNOSIS — Z125 Encounter for screening for malignant neoplasm of prostate: Secondary | ICD-10-CM

## 2012-04-18 DIAGNOSIS — Z79899 Other long term (current) drug therapy: Secondary | ICD-10-CM

## 2012-04-18 DIAGNOSIS — Z0389 Encounter for observation for other suspected diseases and conditions ruled out: Secondary | ICD-10-CM

## 2012-04-18 DIAGNOSIS — Z0000 Encounter for general adult medical examination without abnormal findings: Secondary | ICD-10-CM

## 2012-04-18 LAB — CBC WITH DIFFERENTIAL/PLATELET
Basophils Absolute: 0 10*3/uL (ref 0.0–0.1)
Basophils Relative: 0.4 % (ref 0.0–3.0)
Eosinophils Absolute: 0.2 10*3/uL (ref 0.0–0.7)
Eosinophils Relative: 2.3 % (ref 0.0–5.0)
HCT: 47.6 % (ref 39.0–52.0)
Hemoglobin: 15.9 g/dL (ref 13.0–17.0)
Lymphocytes Relative: 34 % (ref 12.0–46.0)
Lymphs Abs: 2.5 10*3/uL (ref 0.7–4.0)
MCHC: 33.4 g/dL (ref 30.0–36.0)
MCV: 86.7 fl (ref 78.0–100.0)
Monocytes Absolute: 0.8 10*3/uL (ref 0.1–1.0)
Monocytes Relative: 10.8 % (ref 3.0–12.0)
Neutro Abs: 3.8 10*3/uL (ref 1.4–7.7)
Neutrophils Relative %: 52.5 % (ref 43.0–77.0)
Platelets: 220 10*3/uL (ref 150.0–400.0)
RBC: 5.49 Mil/uL (ref 4.22–5.81)
RDW: 13.9 % (ref 11.5–14.6)
WBC: 7.3 10*3/uL (ref 4.5–10.5)

## 2012-04-18 LAB — LIPID PANEL
Cholesterol: 173 mg/dL (ref 0–200)
HDL: 33.1 mg/dL — ABNORMAL LOW (ref >39.00)
LDL Cholesterol: 115 mg/dL — ABNORMAL HIGH (ref 0–99)
Total CHOL/HDL Ratio: 5
Triglycerides: 125 mg/dL (ref 0.0–149.0)
VLDL: 25 mg/dL (ref 0.0–40.0)

## 2012-04-18 LAB — URINALYSIS, ROUTINE W REFLEX MICROSCOPIC
Bilirubin Urine: NEGATIVE
Hgb urine dipstick: NEGATIVE
Ketones, ur: NEGATIVE
Leukocytes, UA: NEGATIVE
Nitrite: NEGATIVE
Specific Gravity, Urine: 1.025 (ref 1.000–1.030)
Total Protein, Urine: NEGATIVE
Urine Glucose: NEGATIVE
Urobilinogen, UA: 0.2 (ref 0.0–1.0)
pH: 6 (ref 5.0–8.0)

## 2012-04-18 LAB — BASIC METABOLIC PANEL
BUN: 18 mg/dL (ref 6–23)
CO2: 29 mEq/L (ref 19–32)
Calcium: 9.1 mg/dL (ref 8.4–10.5)
Chloride: 104 mEq/L (ref 96–112)
Creatinine, Ser: 1 mg/dL (ref 0.4–1.5)
GFR: 78.68 mL/min (ref >60.00)
Glucose, Bld: 111 mg/dL — ABNORMAL HIGH (ref 70–99)
Potassium: 4.8 mEq/L (ref 3.5–5.1)
Sodium: 139 mEq/L (ref 135–145)

## 2012-04-18 LAB — HEPATIC FUNCTION PANEL
ALT: 30 U/L (ref 0–53)
AST: 28 U/L (ref 0–37)
Albumin: 4 g/dL (ref 3.5–5.2)
Alkaline Phosphatase: 77 U/L (ref 39–117)
Bilirubin, Direct: 0.2 mg/dL (ref 0.0–0.3)
Total Bilirubin: 1.4 mg/dL — ABNORMAL HIGH (ref 0.3–1.2)
Total Protein: 6.9 g/dL (ref 6.0–8.3)

## 2012-04-18 LAB — TSH: TSH: 1.74 u[IU]/mL (ref 0.35–5.50)

## 2012-04-18 LAB — PSA: PSA: 0.37 ng/mL (ref 0.10–4.00)

## 2012-04-22 ENCOUNTER — Ambulatory Visit (INDEPENDENT_AMBULATORY_CARE_PROVIDER_SITE_OTHER): Payer: Medicare Other | Admitting: Internal Medicine

## 2012-04-22 ENCOUNTER — Encounter: Payer: Self-pay | Admitting: Internal Medicine

## 2012-04-22 VITALS — BP 140/90 | HR 68 | Temp 97.8°F | Resp 16 | Wt 217.0 lb

## 2012-04-22 DIAGNOSIS — R030 Elevated blood-pressure reading, without diagnosis of hypertension: Secondary | ICD-10-CM

## 2012-04-22 DIAGNOSIS — IMO0002 Reserved for concepts with insufficient information to code with codable children: Secondary | ICD-10-CM

## 2012-04-22 DIAGNOSIS — Z0000 Encounter for general adult medical examination without abnormal findings: Secondary | ICD-10-CM | POA: Insufficient documentation

## 2012-04-22 DIAGNOSIS — I6529 Occlusion and stenosis of unspecified carotid artery: Secondary | ICD-10-CM

## 2012-04-22 DIAGNOSIS — IMO0001 Reserved for inherently not codable concepts without codable children: Secondary | ICD-10-CM | POA: Insufficient documentation

## 2012-04-22 DIAGNOSIS — M5416 Radiculopathy, lumbar region: Secondary | ICD-10-CM

## 2012-04-22 MED ORDER — LOSARTAN POTASSIUM 50 MG PO TABS: 50.0000 mg | ORAL_TABLET | Freq: Every day | ORAL | Status: DC

## 2012-04-22 NOTE — Progress Notes (Signed)
   Subjective:    Patient ID: Francis Crews., male    DOB: [birth year], 71 y.o.   MRN: [id number]  HPI The patient is here for a wellness exam. The patient has been doing well overall after to lower back surgeries  BP is nl at home  BP Readings from Last 3 Encounters:  04/22/12 140/90  07/27/11 159/91  07/27/11 159/91   Wt Readings from Last 3 Encounters:  04/22/12 217 lb (98.431 kg)  07/24/11 218 lb (98.884 kg)  07/24/11 218 lb (98.884 kg)      .Review of Systems  Constitutional: Negative for appetite change, fatigue and unexpected weight change.  HENT: Negative for nosebleeds, congestion, sore throat, sneezing, trouble swallowing and neck pain.   Eyes: Negative for itching and visual disturbance.  Respiratory: Negative for cough.   Cardiovascular: Negative for chest pain, palpitations and leg swelling.  Gastrointestinal: Negative for nausea, diarrhea, blood in stool and abdominal distention.  Genitourinary: Negative for frequency and hematuria.  Musculoskeletal: Negative for back pain, joint swelling and gait problem.  Skin: Negative for rash.  Neurological: Negative for dizziness, tremors, speech difficulty and weakness.  Psychiatric/Behavioral: Negative for sleep disturbance, dysphoric mood and agitation. The patient is not nervous/anxious.        Objective:   Physical Exam  Constitutional: He is oriented to person, place, and time. He appears well-developed.  HENT:  Mouth/Throat: Oropharynx is clear and moist.  Eyes: Conjunctivae normal are normal. Pupils are equal, round, and reactive to light.  Neck: Normal range of motion. No JVD present. No thyromegaly present.  Cardiovascular: Normal rate, regular rhythm, normal heart sounds and intact distal pulses.  Exam reveals no gallop and no friction rub.   No murmur heard. Pulmonary/Chest: Effort normal and breath sounds normal. No respiratory distress. He has no wheezes. He has no rales. He exhibits no tenderness.    Abdominal: Soft. Bowel sounds are normal. He exhibits no distension and no mass. There is no tenderness. There is no rebound and no guarding.  Musculoskeletal: Normal range of motion. He exhibits no edema and no tenderness.       LS is not tender  Lymphadenopathy:    He has no cervical adenopathy.  Neurological: He is alert and oriented to person, place, and time. He has normal reflexes. No cranial nerve deficit. He exhibits normal muscle tone. Coordination normal.  Skin: Skin is warm and dry. No rash noted.  Psychiatric: He has a normal mood and affect. His behavior is normal. Judgment and thought content normal.   Lab Results  Component Value Date   WBC 7.3 04/18/2012   HGB 15.9 04/18/2012   HCT 47.6 04/18/2012   PLT 220.0 04/18/2012   GLUCOSE 111* 04/18/2012   CHOL 173 04/18/2012   TRIG 125.0 04/18/2012   HDL 33.10* 04/18/2012   LDLDIRECT 147.1 07/04/2009   LDLCALC 115* 04/18/2012   ALT 30 04/18/2012   AST 28 04/18/2012   NA 139 04/18/2012   K 4.8 04/18/2012   CL 104 04/18/2012   CREATININE 1.0 04/18/2012   BUN 18 04/18/2012   CO2 29 04/18/2012   TSH 1.74 04/18/2012   PSA 0.37 04/18/2012          Assessment & Plan:

## 2012-04-22 NOTE — Assessment & Plan Note (Addendum)
Due the test soon ASA BP is nl at home

## 2012-04-22 NOTE — Assessment & Plan Note (Signed)
We discussed age appropriate health related issues, including available/recomended screening tests and vaccinations. We discussed a need for adhering to healthy diet and exercise. Labs/EKG were reviewed/ordered. All questions were answered. He declined a flu shot  

## 2012-04-24 NOTE — Assessment & Plan Note (Signed)
Start Cozaar 

## 2012-04-24 NOTE — Assessment & Plan Note (Signed)
11/13 resolved - s/p surgery x2

## 2012-07-18 ENCOUNTER — Telehealth: Payer: Self-pay | Admitting: Internal Medicine

## 2012-07-18 NOTE — Telephone Encounter (Signed)
Patient has questions about his medical history

## 2012-07-19 NOTE — Telephone Encounter (Signed)
I called pt- He state his life insurance company doubled his premium because we noted some where that he has cardiac disease. I advised him of the current problem list and all that is listed and dates of when things were entered. I advised him that we don't have cardiac disease listed as one of his problems but only PVD and coronary artery stenosis which could possibly be related to what his insurance company is telling him. He requests copies of his problem list and most recent OV note and EKG. Copies upfront for p/u. Pt aware.

## 2012-09-23 ENCOUNTER — Encounter (INDEPENDENT_AMBULATORY_CARE_PROVIDER_SITE_OTHER): Payer: Medicare Other

## 2012-09-23 DIAGNOSIS — I6529 Occlusion and stenosis of unspecified carotid artery: Secondary | ICD-10-CM

## 2012-11-29 ENCOUNTER — Ambulatory Visit (INDEPENDENT_AMBULATORY_CARE_PROVIDER_SITE_OTHER)
Admission: RE | Admit: 2012-11-29 | Discharge: 2012-11-29 | Disposition: A | Discharge status: Home / Self Care | Payer: Medicare Other | Source: Ambulatory Visit | Attending: Internal Medicine | Admitting: Internal Medicine

## 2012-11-29 ENCOUNTER — Encounter: Payer: Self-pay | Admitting: Internal Medicine

## 2012-11-29 ENCOUNTER — Ambulatory Visit (INDEPENDENT_AMBULATORY_CARE_PROVIDER_SITE_OTHER): Payer: Medicare Other | Admitting: Internal Medicine

## 2012-11-29 VITALS — BP 170/82 | HR 76 | Temp 97.4°F | Resp 16 | Wt 222.0 lb

## 2012-11-29 DIAGNOSIS — R0789 Other chest pain: Secondary | ICD-10-CM

## 2012-11-29 DIAGNOSIS — M79609 Pain in unspecified limb: Secondary | ICD-10-CM

## 2012-11-29 DIAGNOSIS — R071 Chest pain on breathing: Secondary | ICD-10-CM

## 2012-11-29 DIAGNOSIS — M25519 Pain in unspecified shoulder: Secondary | ICD-10-CM

## 2012-11-29 DIAGNOSIS — M25512 Pain in left shoulder: Secondary | ICD-10-CM

## 2012-11-29 DIAGNOSIS — R079 Chest pain, unspecified: Secondary | ICD-10-CM | POA: Insufficient documentation

## 2012-11-29 DIAGNOSIS — Z23 Encounter for immunization: Secondary | ICD-10-CM

## 2012-11-29 MED ORDER — HYDROCODONE-ACETAMINOPHEN 7.5-325 MG PO TABS: 1.0000 | ORAL_TABLET | Freq: Four times a day (QID) | ORAL | Status: DC | PRN

## 2012-11-29 MED ORDER — KETOROLAC TROMETHAMINE 60 MG/2ML IM SOLN
60.0000 mg | Freq: Once | INTRAMUSCULAR | Status: AC
  Administered 2012-11-29: 60 mg via INTRAMUSCULAR

## 2012-11-29 MED ORDER — MUPIROCIN 2 % EX OINT: TOPICAL_OINTMENT | CUTANEOUS | Status: DC

## 2012-11-29 NOTE — Progress Notes (Signed)
  Subjective:    Patient ID: Francis Sims., male    DOB: November 15, 1940, 72 y.o.   MRN: [id number]  HPI    Review of Systems     Objective:   Physical Exam  5 cm L post shoulder abrasion 16x7 cm L knee/shin abrasion L L chest wall pain       Assessment & Plan:

## 2012-11-29 NOTE — Assessment & Plan Note (Signed)
Telfa w/abx oint

## 2012-11-29 NOTE — Assessment & Plan Note (Signed)
6/14 contusion and abrasion L knee and L shin tDAP Mupirocin and telfa dressing ACE wrap
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
## 2012-11-29 NOTE — Assessment & Plan Note (Addendum)
6/14 - contusion/strain - poss rib fx Norco prn Rib belt X ray

## 2012-11-29 NOTE — Assessment & Plan Note (Signed)
6/14 Chest contusion L shoulder and LLE abrasions

## 2012-12-21 ENCOUNTER — Ambulatory Visit: Payer: Medicare Other | Admitting: Internal Medicine

## 2013-02-17 ENCOUNTER — Ambulatory Visit (INDEPENDENT_AMBULATORY_CARE_PROVIDER_SITE_OTHER): Payer: Medicare Other | Admitting: Family Medicine

## 2013-02-17 ENCOUNTER — Encounter: Payer: Self-pay | Admitting: Family Medicine

## 2013-02-17 ENCOUNTER — Encounter: Payer: Self-pay | Admitting: Internal Medicine

## 2013-02-17 ENCOUNTER — Ambulatory Visit (INDEPENDENT_AMBULATORY_CARE_PROVIDER_SITE_OTHER): Payer: Medicare Other | Admitting: Internal Medicine

## 2013-02-17 VITALS — BP 200/118 | HR 71 | Temp 98.4°F | Wt 217.0 lb

## 2013-02-17 VITALS — BP 180/98 | HR 71 | Wt 217.0 lb

## 2013-02-17 DIAGNOSIS — I10 Essential (primary) hypertension: Secondary | ICD-10-CM | POA: Insufficient documentation

## 2013-02-17 DIAGNOSIS — M5416 Radiculopathy, lumbar region: Secondary | ICD-10-CM

## 2013-02-17 DIAGNOSIS — IMO0001 Reserved for inherently not codable concepts without codable children: Secondary | ICD-10-CM

## 2013-02-17 DIAGNOSIS — IMO0002 Reserved for concepts with insufficient information to code with codable children: Secondary | ICD-10-CM

## 2013-02-17 DIAGNOSIS — R030 Elevated blood-pressure reading, without diagnosis of hypertension: Secondary | ICD-10-CM

## 2013-02-17 MED ORDER — CYCLOBENZAPRINE HCL 5 MG PO TABS: 5.0000 mg | ORAL_TABLET | Freq: Three times a day (TID) | ORAL | Status: DC | PRN

## 2013-02-17 MED ORDER — PREDNISONE 20 MG PO TABS: 40.0000 mg | ORAL_TABLET | Freq: Every day | ORAL | Status: DC

## 2013-02-17 MED ORDER — KETOROLAC TROMETHAMINE 60 MG/2ML IM SOLN
60.0000 mg | Freq: Once | INTRAMUSCULAR | Status: AC
Start: 2013-02-17 — End: 2013-02-17
  Administered 2013-02-17: 60 mg via INTRAMUSCULAR

## 2013-02-17 MED ORDER — MELOXICAM 15 MG PO TABS: 15.0000 mg | ORAL_TABLET | Freq: Every day | ORAL | Status: DC

## 2013-02-17 MED ORDER — METHYLPREDNISOLONE ACETATE 80 MG/ML IJ SUSP
80.0000 mg | Freq: Once | INTRAMUSCULAR | Status: AC
  Administered 2013-02-17: 80 mg via INTRAMUSCULAR

## 2013-02-17 MED ORDER — HYDROCODONE-ACETAMINOPHEN 7.5-325 MG PO TABS: 1.0000 | ORAL_TABLET | Freq: Four times a day (QID) | ORAL | Status: DC | PRN

## 2013-02-17 NOTE — Assessment & Plan Note (Signed)
8/14 R severe 2012 R - severe 11/13 resolved - s/p surgery x2 04/24/11 MRI:  IMPRESSION:  1. Large central disc protrusion at L3-4 severely compressing the  thecal sac. Combined with hypertrophy of the left ligamentum  flavum, the thecal sac is severely compressed as are the lateral  recesses.  2. Severe spinal stenosis secondary to a grade 1 spondylolisthesis  and marked hypertrophy of the facet joints and ligamenta flava with  severe compression of both lateral recesses.  Original Report Authenticated By: Gwynn Burly, M.D.

## 2013-02-17 NOTE — Assessment & Plan Note (Signed)
9/14 due to acute pain Will re-check now

## 2013-02-17 NOTE — Assessment & Plan Note (Signed)
Patient does have lumbar radiculitis and appears likely secondary to an acute herniated disc, right side. Patient has had a laminectomy at L3-L4 and based on where patient says this pain is coming from I would suggest that this is the L5 or S1 nerve root compression. Toradol and Depo-Medrol today intramuscular Meloxicam, prednisone, Flexeril short burst over the course of the next 5 days Home exercises to start in 72 hours and given Followup in one week. If not better we will get x-rays and likely MRI. Patient warned that if any weakness occurs prior to that or any bowel or bladder incontinence to call immediately or go to the emergency department this weekend.

## 2013-02-17 NOTE — Patient Instructions (Addendum)
I think you have a small herniated disc Try the two shots today.  Try these medicines Prednisone 40 mg daily for 5 days Meloxicam, take 1 pill daily for 5 days then as needed.  Flexeril 5mg  at night Come back within the next week to make sure you are better.

## 2013-02-17 NOTE — Progress Notes (Signed)
  I'm seeing this patient by the request  of:  Dr. Posey Rea  CC: Low back pain with radiation  HPI: Patient is a very active 72 year old male coming in with right back pain with mild radiation. Patient does have a past medical history significant for cervical fusion in 2000 as well as lumbar laminectomy in December 2012 secondary to a large herniated disc. Patient states that 2 weeks ago he went down to pick something up and immediately had a severe pain on the right side of his lower back did seem to radiate into his buttocks and posterior thigh somewhat. This did not go past his knee. Since that time he has unfortunately had to stop all his activities secondary to the pain as well as been sleeping in a recliner. Patient states that the pain is worse with extension and describes the pain is more of a severe sharp stabbing sensation that can feel like a knife going into his back with certain movements. Patient is found it even difficult to walk. Patient denies any weakness of the lower extremity, denies any swelling, denies any bowel or bladder incontinence. Patient states that the pain is wake him up at night and states that it is 9/10 in severity. Patient did try some prednisone that he had at home but unfortunately this was greater than 89 years of age. Nothing seems to be relieving the pain  Past medical, surgical, family and social history reviewed. Medications reviewed all in the electronic medical record.   Review of Systems: No headache, visual changes, nausea, vomiting, diarrhea, constipation, dizziness, abdominal pain, skin rash, fevers, chills, night sweats, weight loss, swollen lymph nodes, body aches, joint swelling, muscle aches, chest pain, shortness of breath, mood changes.   Objective:    Blood pressure 180/98, pulse 71, weight 217 lb (98.431 kg).   General: No apparent distress alert and oriented x3 mood and affect normal, dressed appropriately.  HEENT: Pupils equal, extraocular  movements intact Respiratory: Patient's speak in full sentences and does not appear short of breath Cardiovascular: No lower extremity edema, non tender, no erythema Skin: Warm dry intact with no signs of infection or rash on extremities or on axial skeleton. Abdomen: Soft nontender Neuro: Cranial nerves II through XII are intact, neurovascularly intact in all extremities with 2+ DTRs and 2+ pulses. Lymph: No lymphadenopathy of posterior or anterior cervical chain or axillae bilaterally.  Gait patient walks in a flexed position intake short strides on the right secondary to pain  MSK: Non tender with full range of motion and good stability and symmetric strength and tone of shoulders, elbows, wrist, hip, knee and ankles bilaterally.  Back Exam:  Inspection: Patient remains in a flexed position with standing Motion: Flexion 45 deg, Extension 15 deg and has significant pain, Side Bending to 25 deg bilaterally,  Rotation to 35 deg bilaterally  SLR laying: Positive right XSLR laying: Positive for right-sided Palpable tenderness: Severely tender to palpation in the paraspinal musculature L4-L5 on the right side FABER: Positive. Sensory change: Gross sensation intact to all lumbar and sacral dermatomes.  Reflexes: 2+ at both patellar tendons, 2+ at achilles tendons, Babinski's downgoing.  Strength at foot  Plantar-flexion: 5/5 Dorsi-flexion: 5/5 Eversion: 5/5 Inversion: 5/5  Leg strength  Quad: 5/5 Hamstring: 5/5 Hip flexor: 5/5 Hip abductors: 5/5      Impression and Recommendations:     This case required medical decision making of moderate complexity.

## 2013-02-19 ENCOUNTER — Encounter: Payer: Self-pay | Admitting: Internal Medicine

## 2013-02-19 NOTE — Progress Notes (Signed)
   Subjective:     Back Pain Pertinent negatives include no chest pain or weakness.    C/o recurrent LS, R buttock, thigh x 12+ months, worse now R leg is not weak He has to walk bent forward at times  Review of Systems  Constitutional: Negative for appetite change, fatigue and unexpected weight change.  HENT: Negative for nosebleeds, congestion, sore throat, sneezing, trouble swallowing and neck pain.   Eyes: Negative for itching and visual disturbance.  Respiratory: Negative for cough.   Cardiovascular: Negative for chest pain, palpitations and leg swelling.  Gastrointestinal: Negative for nausea, diarrhea, blood in stool and abdominal distention.  Genitourinary: Negative for frequency and hematuria.  Musculoskeletal: Positive for back pain and gait problem. Negative for joint swelling.  Skin: Negative for rash.  Neurological: Negative for dizziness, tremors, speech difficulty and weakness.  Psychiatric/Behavioral: Negative for sleep disturbance, dysphoric mood and agitation. The patient is not nervous/anxious.        Objective:   Physical Exam  Constitutional: He is oriented to person, place, and time. He appears well-developed.  HENT:  Mouth/Throat: Oropharynx is clear and moist.  Eyes: Conjunctivae are normal. Pupils are equal, round, and reactive to light.  Neck: Normal range of motion. No JVD present. No thyromegaly present.  Cardiovascular: Normal rate, regular rhythm, normal heart sounds and intact distal pulses.  Exam reveals no gallop and no friction rub.   No murmur heard. Pulmonary/Chest: Effort normal and breath sounds normal. No respiratory distress. He has no wheezes. He has no rales. He exhibits no tenderness.  Abdominal: Soft. Bowel sounds are normal. He exhibits no distension and no mass. There is no tenderness. There is no rebound and no guarding.  Musculoskeletal: Normal range of motion. He exhibits tenderness ( R  SI is tender; R buttock is tender). He  exhibits no edema.  LS is tender  Lymphadenopathy:    He has no cervical adenopathy.  Neurological: He is alert and oriented to person, place, and time. He has normal reflexes. No cranial nerve deficit. He exhibits normal muscle tone. Coordination normal.  Skin: Skin is warm and dry. No rash noted.  Psychiatric: He has a normal mood and affect. His behavior is normal. Judgment and thought content normal.          Assessment & Plan:

## 2013-02-23 ENCOUNTER — Ambulatory Visit (INDEPENDENT_AMBULATORY_CARE_PROVIDER_SITE_OTHER): Payer: Medicare Other | Admitting: Family Medicine

## 2013-02-23 ENCOUNTER — Encounter: Payer: Self-pay | Admitting: Family Medicine

## 2013-02-23 ENCOUNTER — Ambulatory Visit (INDEPENDENT_AMBULATORY_CARE_PROVIDER_SITE_OTHER)
Admission: RE | Admit: 2013-02-23 | Discharge: 2013-02-23 | Disposition: A | Discharge status: Home / Self Care | Payer: Medicare Other | Source: Ambulatory Visit | Attending: Family Medicine | Admitting: Family Medicine

## 2013-02-23 VITALS — BP 136/82 | HR 64 | Wt 221.0 lb

## 2013-02-23 DIAGNOSIS — IMO0002 Reserved for concepts with insufficient information to code with codable children: Secondary | ICD-10-CM

## 2013-02-23 DIAGNOSIS — M545 Low back pain, unspecified: Secondary | ICD-10-CM

## 2013-02-23 DIAGNOSIS — M5416 Radiculopathy, lumbar region: Secondary | ICD-10-CM

## 2013-02-23 NOTE — Patient Instructions (Signed)
I am glad you are doing better.  You can start exercising again but only 25% of original weight.  Increase by 10% every 5 days.  Add some functional movement exercises as well.  I am giving you some stretches to do Usually best after exercise Medicines as needed Come back in 3 weeks.

## 2013-02-23 NOTE — Assessment & Plan Note (Signed)
The patient has improved after course of prednisone. I do think patient likely had and may still have a partial herniated disc mostly causing compression of the L3 possibly L2 nerve roots on the right side giving him radiculopathy. As of now I would like to get an x-ray to see if there is any worsening of his spinal listhesis and make sure patient's fusion appears to be intact. Patient will continue meloxicam and Flexeril as needed Discuss starting patient's regular activities including going back to the gym but at 25% a regular amount. Patient's goal is to be back where he was before injury in one months time. Discussed with patient potential formal physical therapy which she declined. Discuss stretching and functional exercises that would be beneficial. Patient will come back again in 3 weeks for further evaluation.

## 2013-02-23 NOTE — Progress Notes (Signed)
  Subjective:    CC: Back pain with radiation down the legs  HPI: Patient is a 72 year old male with a past medical history significant for an L3-L5 posterior lateral interbody fusion coming in for followup of acute low back pain with radiculopathy. Patient was given prednisone, anti-inflammatories and muscle relaxants at last visit. Patient states he is approximately 60% better. Patient continues to take the muscle relaxant at night which is helpful for sleeping. Patient has not started his regular activities such as going to the gym daily as of yet. Patient still states he does have some back pain and it does still radiate around the side and states that it goes towards the groin area in the anterior aspect of the thigh. Patient states that the pain seems to be much better but and other activities seem to be causing some discomfort such as sitting which was not giving him trouble previously. Patient states that the prednisone didn't help significantly with the pain but he did not like the side effects. Patient like to avoid this in the future if possible.  Past medical history, Surgical history, Family history not pertinant except as noted below, Social history, Allergies, and medications have been entered into the medical record, reviewed, and no changes needed.   Review of Systems: No fevers, chills, night sweats, weight loss, chest pain, or shortness of breath. Patient also denies any abdominal pain or changes in bowel habits.  Objective:   Blood pressure 136/82, pulse 64, weight 221 lb (100.245 kg).  General: Well Developed, well nourished, and in no acute distress.  Neuro: Alert and oriented x3, extra-ocular muscles intact, sensation grossly intact.  HEENT: Normocephalic, atraumatic, pupils equal round reactive to light, neck supple, no masses, no lymphadenopathy, thyroid nonpalpable.  Skin: Warm and dry, no rashes. Cardiac:  no lower extremity edema. Respiratory: Not using accessory  muscles, speaking in full sentences. Abdominal: NT, soft Gait: Nonantlagic, good balance and coordination Lymphatic: no lymphadenopathy in neck or axillae on palpation, non tender.  Musculoskeletal: Inspection and palpation of the right and left upper extremities including the shoulders elbows and wrist are unremarkable with full range of motion and good muscle strength and tone. Inspection and palpation of the right and left lower extremities including the hips knees and ankles are unremarkable and nontender with full range of motion and good muscle strength and tone and are symmetric. Back Exam:  Inspection: Patient is able to stand much more comfortably as well as set much more comfortably. Motion: Flexion 45 deg, Extension 25,  Side Bending to 25 deg bilaterally,  Rotation to 35 deg bilaterally  SLR laying: Positive right with radiculopathy of the anterior thigh. XSLR laying: Positive for right-sided  Palpable tenderness:  tender to palpation in the paraspinal musculature L4-L5 on the right side but seems to be improving FABER: Negative Sensory change: Gross sensation intact to all lumbar and sacral dermatomes.  Reflexes: 2+ at both patellar tendons, 2+ at achilles tendons, Babinski's downgoing.  Strength at foot  Plantar-flexion: 5/5 Dorsi-flexion: 5/5 Eversion: 5/5 Inversion: 5/5  Leg strength  Quad: 5/5 Hamstring: 5/5 Hip flexor: 5/5 Hip abductors: 5/5    Impression and Recommendations:   Spent greater than 25 minutes with patient face-to-face and had greater than 50% of counseling including as described above in assessment and plan.

## 2013-03-02 ENCOUNTER — Ambulatory Visit (INDEPENDENT_AMBULATORY_CARE_PROVIDER_SITE_OTHER): Payer: Medicare Other | Admitting: Family Medicine

## 2013-03-02 ENCOUNTER — Ambulatory Visit (INDEPENDENT_AMBULATORY_CARE_PROVIDER_SITE_OTHER)
Admission: RE | Admit: 2013-03-02 | Discharge: 2013-03-02 | Disposition: A | Discharge status: Home / Self Care | Payer: Medicare Other | Source: Ambulatory Visit | Attending: Family Medicine | Admitting: Family Medicine

## 2013-03-02 ENCOUNTER — Encounter: Payer: Self-pay | Admitting: Family Medicine

## 2013-03-02 VITALS — BP 146/90 | HR 86 | Wt 214.0 lb

## 2013-03-02 DIAGNOSIS — M5416 Radiculopathy, lumbar region: Secondary | ICD-10-CM

## 2013-03-02 DIAGNOSIS — M545 Low back pain, unspecified: Secondary | ICD-10-CM

## 2013-03-02 DIAGNOSIS — IMO0002 Reserved for concepts with insufficient information to code with codable children: Secondary | ICD-10-CM

## 2013-03-02 MED ORDER — KETOROLAC TROMETHAMINE 60 MG/2ML IM SOLN
60.0000 mg | Freq: Once | INTRAMUSCULAR | Status: AC
  Administered 2013-03-02: 60 mg via INTRAMUSCULAR

## 2013-03-02 MED ORDER — METHYLPREDNISOLONE ACETATE 80 MG/ML IJ SUSP
80.0000 mg | Freq: Once | INTRAMUSCULAR | Status: AC
  Administered 2013-03-02: 80 mg via INTRAMUSCULAR

## 2013-03-02 MED ORDER — DIAZEPAM 5 MG PO TABS: 5.0000 mg | ORAL_TABLET | Freq: Two times a day (BID) | ORAL | Status: DC | PRN

## 2013-03-02 NOTE — Patient Instructions (Signed)
I am sorry you are in so much pain We are getting a CT scan I will call you with the results.  I also will give you a couple injections to help You may need to see D.r Kritzer again if something is needed to be done.

## 2013-03-02 NOTE — Assessment & Plan Note (Signed)
Patient's pain is concerning for further nerve root impingement. Patient has had surgery previously. Patient does have a plate with screws placed due to prior surgery so MRI is not the welcome imaging study. We'll do a CT scan of the lumbar spine without contrast. I would like to rule out an adjacent segmental disease as well as L3-L4 nerve root impingement on the right side. Patient was given portal and Depo-Medrol again today. I think this will help with some of the inflammation. He'll continue his other medications. Patient was given a prescription for Valium to help with his claustrophobia To call patient with the results and we'll discuss further steps thereafter. If we do see any adjacent segmental disease or any nerve root impingement depending on the area we will either consider epidural steroid injections versus referral to Dr. Effie Shy for further evaluation.

## 2013-03-02 NOTE — Progress Notes (Signed)
  Subjective:    CC: Back pain with radiation down the legs  HPI: Patient is a 72 year old male with a past medical history significant for an L3-L5 posterior lateral interbody fusion coming in for followup of acute low back pain with radiculopathy. Patient was making improvement after prednisone and anti-inflammatories but unfortunately over the course of the last 72 hours he has had significant more pain again. Patient states that the radiation down his leg seems to be worsening as well. g. Patient has not started his regular activities such as going to the gym daily as of yet. Patient states that it has become so uncomfortable that he is having of difficulty finding comfortable position. Patient denies any new symptoms such as weakness of the lower sternum the or any bowel or bladder incontinence. Patient with the pain at 10 out of 10 and is growing very frustrated with the worsening of the pain.   Past medical history, Surgical history, Family history not pertinant except as noted below, Social history, Allergies, and medications have been entered into the medical record, reviewed, and no changes needed.   Review of Systems: No fevers, chills, night sweats, weight loss, chest pain, or shortness of breath. Patient also denies any abdominal pain or changes in bowel habits.  Objective:   Blood pressure 146/90, pulse 86, weight 214 lb (97.07 kg), SpO2 98.00%.  General: Well Developed, well nourished, and in no acute distress.  Neuro: Alert and oriented x3, extra-ocular muscles intact, sensation grossly intact.  HEENT: Normocephalic, atraumatic, pupils equal round reactive to light, neck supple, no masses, no lymphadenopathy, thyroid nonpalpable.  Skin: Warm and dry, no rashes. Cardiac:  no lower extremity edema. Respiratory: Not using accessory muscles, speaking in full sentences. Abdominal: NT, soft Gait: Nonantlagic, good balance and coordination Lymphatic: no lymphadenopathy in neck or  axillae on palpation, non tender.  Musculoskeletal: Inspection and palpation of the right and left upper extremities including the shoulders elbows and wrist are unremarkable with full range of motion and good muscle strength and tone. Inspection and palpation of the right and left lower extremities including the hips knees and ankles are unremarkable and nontender with full range of motion and good muscle strength and tone and are symmetric. Back Exam:  Inspection: Patient is able to stand much more comfortably as well as set much more comfortably. Motion: Flexion 45 deg, Extension 25,  Side Bending to 25 deg bilaterally,  Rotation to 35 deg bilaterally  SLR laying: Positive right with radiculopathy of the anterior thigh. XSLR laying: Positive for right-sided  Palpable tenderness:  tender to palpation in the paraspinal musculature L4-L5 on the right side but seems to be improving FABER: Positive Sensory change: Gross sensation intact to all lumbar and sacral dermatomes.  Reflexes: 2+ at both patellar tendons, 2+ at achilles tendons, Babinski's downgoing.  Strength at foot  Plantar-flexion: 5/5 Dorsi-flexion: 5/5 Eversion: 5/5 Inversion: 5/5  Leg strength  Quad: 5/5 Hamstring: 5/5 Hip flexor: 5/5 Hip abductors: 5/5    Impression and Recommendations:   Spent greater than 25 minutes with patient face-to-face and had greater than 50% of counseling including as described above in assessment and plan.

## 2013-03-03 ENCOUNTER — Telehealth: Payer: Self-pay | Admitting: Family Medicine

## 2013-03-03 NOTE — Telephone Encounter (Signed)
Called the patient and give him results. I think likelihood he should followup with his surgeon. I think there is a possibility for an L3 nerve root impingement when I look at the imaging myself. This would correspond fairly significantly to his symptoms. Patient states he'll call and make the appointment. My office is more than willing to forward all results, notes and other imaging if necessary.

## 2013-03-16 ENCOUNTER — Ambulatory Visit: Payer: Medicare Other | Admitting: Family Medicine

## 2013-04-10 ENCOUNTER — Encounter (HOSPITAL_COMMUNITY): Payer: Self-pay | Admitting: Emergency Medicine

## 2013-04-10 ENCOUNTER — Emergency Department (HOSPITAL_COMMUNITY): Payer: Medicare Other

## 2013-04-10 ENCOUNTER — Emergency Department (HOSPITAL_COMMUNITY)
Admission: EM | Admit: 2013-04-10 | Discharge: 2013-04-10 | Disposition: A | Discharge status: Home / Self Care | Payer: Medicare Other | Attending: Emergency Medicine | Admitting: Emergency Medicine

## 2013-04-10 DIAGNOSIS — G8929 Other chronic pain: Secondary | ICD-10-CM | POA: Insufficient documentation

## 2013-04-10 DIAGNOSIS — Z79899 Other long term (current) drug therapy: Secondary | ICD-10-CM | POA: Insufficient documentation

## 2013-04-10 DIAGNOSIS — Z872 Personal history of diseases of the skin and subcutaneous tissue: Secondary | ICD-10-CM | POA: Insufficient documentation

## 2013-04-10 DIAGNOSIS — Z87891 Personal history of nicotine dependence: Secondary | ICD-10-CM | POA: Insufficient documentation

## 2013-04-10 DIAGNOSIS — Z880 Allergy status to penicillin: Secondary | ICD-10-CM | POA: Insufficient documentation

## 2013-04-10 DIAGNOSIS — S0180XA Unspecified open wound of other part of head, initial encounter: Secondary | ICD-10-CM | POA: Insufficient documentation

## 2013-04-10 DIAGNOSIS — S0120XA Unspecified open wound of nose, initial encounter: Secondary | ICD-10-CM | POA: Insufficient documentation

## 2013-04-10 DIAGNOSIS — Y9355 Activity, bike riding: Secondary | ICD-10-CM | POA: Insufficient documentation

## 2013-04-10 DIAGNOSIS — S0181XA Laceration without foreign body of other part of head, initial encounter: Secondary | ICD-10-CM

## 2013-04-10 DIAGNOSIS — Z8719 Personal history of other diseases of the digestive system: Secondary | ICD-10-CM | POA: Insufficient documentation

## 2013-04-10 DIAGNOSIS — S0121XA Laceration without foreign body of nose, initial encounter: Secondary | ICD-10-CM

## 2013-04-10 DIAGNOSIS — S01411A Laceration without foreign body of right cheek and temporomandibular area, initial encounter: Secondary | ICD-10-CM

## 2013-04-10 DIAGNOSIS — S01409A Unspecified open wound of unspecified cheek and temporomandibular area, initial encounter: Secondary | ICD-10-CM | POA: Insufficient documentation

## 2013-04-10 DIAGNOSIS — I10 Essential (primary) hypertension: Secondary | ICD-10-CM | POA: Insufficient documentation

## 2013-04-10 DIAGNOSIS — Z7982 Long term (current) use of aspirin: Secondary | ICD-10-CM | POA: Insufficient documentation

## 2013-04-10 DIAGNOSIS — Z860100 Personal history of colon polyps, unspecified: Secondary | ICD-10-CM | POA: Insufficient documentation

## 2013-04-10 DIAGNOSIS — S060X9A Concussion with loss of consciousness of unspecified duration, initial encounter: Secondary | ICD-10-CM | POA: Insufficient documentation

## 2013-04-10 DIAGNOSIS — S01511A Laceration without foreign body of lip, initial encounter: Secondary | ICD-10-CM

## 2013-04-10 DIAGNOSIS — Y9289 Other specified places as the place of occurrence of the external cause: Secondary | ICD-10-CM | POA: Insufficient documentation

## 2013-04-10 DIAGNOSIS — S01501A Unspecified open wound of lip, initial encounter: Secondary | ICD-10-CM | POA: Insufficient documentation

## 2013-04-10 HISTORY — DX: Essential (primary) hypertension: I10

## 2013-04-10 LAB — POCT I-STAT, CHEM 8
BUN: 26 mg/dL — ABNORMAL HIGH (ref 6–23)
Calcium, Ion: 1.19 mmol/L (ref 1.13–1.30)
Chloride: 105 mEq/L (ref 96–112)
Creatinine, Ser: 1.1 mg/dL (ref 0.50–1.35)
Glucose, Bld: 129 mg/dL — ABNORMAL HIGH (ref 70–99)
HCT: 49 % (ref 39.0–52.0)
Hemoglobin: 16.7 g/dL (ref 13.0–17.0)
Potassium: 3.9 mEq/L (ref 3.5–5.1)
Sodium: 142 mEq/L (ref 135–145)
TCO2: 24 mmol/L (ref 0–100)

## 2013-04-10 LAB — POCT I-STAT CREATININE: Creatinine, Ser: 1.2 mg/dL (ref 0.50–1.35)

## 2013-04-10 MED ORDER — ONDANSETRON HCL 4 MG/2ML IJ SOLN
4.0000 mg | Freq: Once | INTRAMUSCULAR | Status: AC
  Administered 2013-04-10: 4 mg via INTRAVENOUS
  Filled 2013-04-10: qty 2

## 2013-04-10 MED ORDER — IOHEXOL 350 MG/ML SOLN
50.0000 mL | Freq: Once | INTRAVENOUS | Status: AC | PRN
  Administered 2013-04-10: 50 mL via INTRAVENOUS

## 2013-04-10 MED ORDER — OXYCODONE-ACETAMINOPHEN 5-325 MG PO TABS: 2.0000 | ORAL_TABLET | ORAL | Status: DC | PRN

## 2013-04-10 MED ORDER — OXYCODONE-ACETAMINOPHEN 5-325 MG PO TABS
1.0000 | ORAL_TABLET | Freq: Once | ORAL | Status: AC
  Administered 2013-04-10: 1 via ORAL
  Filled 2013-04-10: qty 1

## 2013-04-10 NOTE — ED Notes (Signed)
Discharge instructions reviewed with pt. Pt verbalized understanding.   
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
## 2013-04-10 NOTE — ED Notes (Signed)
Patient transported to CT 
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
## 2013-04-10 NOTE — ED Notes (Signed)
Pt returned from CT °

## 2013-04-14 ENCOUNTER — Encounter: Payer: Self-pay | Admitting: Internal Medicine

## 2013-04-14 ENCOUNTER — Other Ambulatory Visit (INDEPENDENT_AMBULATORY_CARE_PROVIDER_SITE_OTHER): Payer: Medicare Other

## 2013-04-14 ENCOUNTER — Ambulatory Visit (INDEPENDENT_AMBULATORY_CARE_PROVIDER_SITE_OTHER): Payer: Medicare Other | Admitting: Internal Medicine

## 2013-04-14 ENCOUNTER — Ambulatory Visit (INDEPENDENT_AMBULATORY_CARE_PROVIDER_SITE_OTHER): Payer: Medicare Other | Admitting: Family Medicine

## 2013-04-14 ENCOUNTER — Encounter: Payer: Self-pay | Admitting: Family Medicine

## 2013-04-14 VITALS — BP 182/90 | HR 80 | Temp 98.6°F | Resp 16 | Wt 218.0 lb

## 2013-04-14 VITALS — BP 182/90 | HR 80 | Wt 218.0 lb

## 2013-04-14 DIAGNOSIS — S01511A Laceration without foreign body of lip, initial encounter: Secondary | ICD-10-CM

## 2013-04-14 DIAGNOSIS — S4980XA Other specified injuries of shoulder and upper arm, unspecified arm, initial encounter: Secondary | ICD-10-CM

## 2013-04-14 DIAGNOSIS — S43109A Unspecified dislocation of unspecified acromioclavicular joint, initial encounter: Secondary | ICD-10-CM | POA: Insufficient documentation

## 2013-04-14 DIAGNOSIS — S40019A Contusion of unspecified shoulder, initial encounter: Secondary | ICD-10-CM

## 2013-04-14 DIAGNOSIS — S060X9A Concussion with loss of consciousness of unspecified duration, initial encounter: Secondary | ICD-10-CM

## 2013-04-14 DIAGNOSIS — M25511 Pain in right shoulder: Secondary | ICD-10-CM

## 2013-04-14 DIAGNOSIS — M79609 Pain in unspecified limb: Secondary | ICD-10-CM

## 2013-04-14 DIAGNOSIS — IMO0001 Reserved for inherently not codable concepts without codable children: Secondary | ICD-10-CM

## 2013-04-14 DIAGNOSIS — S01501A Unspecified open wound of lip, initial encounter: Secondary | ICD-10-CM

## 2013-04-14 DIAGNOSIS — S4991XA Unspecified injury of right shoulder and upper arm, initial encounter: Secondary | ICD-10-CM

## 2013-04-14 DIAGNOSIS — Z87891 Personal history of nicotine dependence: Secondary | ICD-10-CM

## 2013-04-14 DIAGNOSIS — S43101A Unspecified dislocation of right acromioclavicular joint, initial encounter: Secondary | ICD-10-CM

## 2013-04-14 DIAGNOSIS — S40029A Contusion of unspecified upper arm, initial encounter: Secondary | ICD-10-CM | POA: Insufficient documentation

## 2013-04-14 DIAGNOSIS — M25519 Pain in unspecified shoulder: Secondary | ICD-10-CM

## 2013-04-14 MED ORDER — ONDANSETRON HCL 4 MG PO TABS: 4.0000 mg | ORAL_TABLET | Freq: Three times a day (TID) | ORAL | Status: DC | PRN

## 2013-04-14 MED ORDER — PHOSPHATIDYLSERINE-DHA-EPA 100-19.5-6.5 MG PO CAPS: 1.0000 | ORAL_CAPSULE | Freq: Every day | ORAL | Status: DC

## 2013-04-14 MED ORDER — KETOROLAC TROMETHAMINE 30 MG/ML IJ SOLN
30.0000 mg | Freq: Once | INTRAMUSCULAR | Status: AC
  Administered 2013-04-14: 30 mg via INTRAMUSCULAR

## 2013-04-14 MED ORDER — MUPIROCIN 2 % EX OINT: TOPICAL_OINTMENT | CUTANEOUS | Status: DC

## 2013-04-14 NOTE — Assessment & Plan Note (Addendum)
Dr Katrinka Blazing ?dislocated [facility] Rot cuff tear Hydrocodone prn - he has it at home Toradol im

## 2013-04-14 NOTE — Assessment & Plan Note (Addendum)
04/10/13 upper inner lip R 1 cm Rinse, irrigate, soft food

## 2013-04-14 NOTE — Assessment & Plan Note (Signed)
Physical findings as well as ultrasound showed a patient does have an a.c. joint grade 1 separation. Patient was given treatment options and decided on a injection today.  Procedure note After verbal consent patient was prepped with alcohol swabs and under ultrasound guidance did have an injection with a 23-gauge 1-1/2 inch needle into the right a.c. joint with a posterior lateral approach. Patient did have injection in of 2 cc of 0.5% Marcaine and 1 cc of Kenalog 40 mg/dL. Pictures were taken. Patient had some resolution of pain. No blood loss. Patient tolerated the procedure well and post injection instructions given.  Patient also given home exercises and discussed icing protocol. Discussed continuing anti-inflammatories. Discussed using sling for comfort. Patient will come back in 2 weeks for further evaluation.
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
## 2013-04-14 NOTE — Progress Notes (Signed)
Patient ID: Francis Sims., male   DOB: 11-13-1940, 72 y.o.   MRN: [id number]
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
## 2013-04-14 NOTE — Assessment & Plan Note (Addendum)
Vyacog 1 qd Rest No ETOH RTC 1 mo Zofran prn

## 2013-04-16 ENCOUNTER — Encounter: Payer: Self-pay | Admitting: Internal Medicine
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
## 2013-04-17 ENCOUNTER — Encounter: Payer: Medicare Other | Admitting: Internal Medicine

## 2013-04-20 ENCOUNTER — Other Ambulatory Visit: Payer: Self-pay

## 2013-04-21 ENCOUNTER — Telehealth: Payer: Self-pay | Admitting: *Deleted

## 2013-04-21 NOTE — Telephone Encounter (Signed)
Called patient back about shoulder.   Patient was asking about nitroglycerin patches. Told patient because he is on erectile dysfunction medication that this is not a possibility. Patient would like to not stop the medication so that will keep [place] with the same treatment options as of now. Patient will followup on his regular basis.

## 2013-04-21 NOTE — Telephone Encounter (Signed)
Pt called requesting return call in reference to shoulder injury.  Please advise

## 2013-05-05 ENCOUNTER — Encounter: Payer: Self-pay | Admitting: Internal Medicine

## 2013-05-05 ENCOUNTER — Ambulatory Visit (INDEPENDENT_AMBULATORY_CARE_PROVIDER_SITE_OTHER): Payer: Medicare Other | Admitting: Internal Medicine

## 2013-05-05 VITALS — BP 162/94 | HR 80 | Temp 98.2°F | Resp 16 | Ht 74.0 in | Wt 213.0 lb

## 2013-05-05 DIAGNOSIS — Z0000 Encounter for general adult medical examination without abnormal findings: Secondary | ICD-10-CM

## 2013-05-05 DIAGNOSIS — S060X9D Concussion with loss of consciousness of unspecified duration, subsequent encounter: Secondary | ICD-10-CM

## 2013-05-05 DIAGNOSIS — N320 Bladder-neck obstruction: Secondary | ICD-10-CM

## 2013-05-05 DIAGNOSIS — H0259 Other disorders affecting eyelid function: Secondary | ICD-10-CM | POA: Insufficient documentation

## 2013-05-05 DIAGNOSIS — S4991XS Unspecified injury of right shoulder and upper arm, sequela: Secondary | ICD-10-CM

## 2013-05-05 DIAGNOSIS — R071 Chest pain on breathing: Secondary | ICD-10-CM

## 2013-05-05 DIAGNOSIS — Z23 Encounter for immunization: Secondary | ICD-10-CM

## 2013-05-05 DIAGNOSIS — H02529 Blepharophimosis unspecified eye, unspecified lid: Secondary | ICD-10-CM

## 2013-05-05 DIAGNOSIS — Z5189 Encounter for other specified aftercare: Secondary | ICD-10-CM

## 2013-05-05 DIAGNOSIS — S01511D Laceration without foreign body of lip, subsequent encounter: Secondary | ICD-10-CM

## 2013-05-05 DIAGNOSIS — IMO0001 Reserved for inherently not codable concepts without codable children: Secondary | ICD-10-CM

## 2013-05-05 DIAGNOSIS — S43101S Unspecified dislocation of right acromioclavicular joint, sequela: Secondary | ICD-10-CM

## 2013-05-05 DIAGNOSIS — R0789 Other chest pain: Secondary | ICD-10-CM

## 2013-05-05 DIAGNOSIS — IMO0002 Reserved for concepts with insufficient information to code with codable children: Secondary | ICD-10-CM

## 2013-05-05 NOTE — Progress Notes (Signed)
   Subjective:    HPI   The patient is here for a wellness exam. The patient has been doing well overall after recent injuries BP is nl at home  R eyebrow scar  - "pulling" and itching R shoulder hurts a lot w/ROM R biceps area is w/a bruise No HA  BP Readings from Last 3 Encounters:  05/05/13 162/94  04/14/13 182/90  04/14/13 182/90   Wt Readings from Last 3 Encounters:  05/05/13 213 lb (96.616 kg)  04/14/13 218 lb (98.884 kg)  04/14/13 218 lb (98.884 kg)      .Review of Systems  Constitutional: Negative for chills, appetite change, fatigue and unexpected weight change.  HENT: Negative for congestion, nosebleeds, sneezing, sore throat and trouble swallowing.   Eyes: Negative for itching and visual disturbance.  Respiratory: Negative for cough.   Cardiovascular: Negative for chest pain, palpitations and leg swelling.  Gastrointestinal: Negative for nausea, diarrhea, blood in stool and abdominal distention.  Genitourinary: Negative for frequency and hematuria.  Musculoskeletal: Positive for arthralgias and joint swelling. Negative for back pain, gait problem and neck pain.  Skin: Negative for rash.  Neurological: Negative for dizziness, tremors, speech difficulty and weakness.  Psychiatric/Behavioral: Negative for sleep disturbance, self-injury, dysphoric mood and agitation. The patient is not nervous/anxious.        Objective:   Physical Exam  Constitutional: He is oriented to person, place, and time. He appears well-developed.  HENT:  Mouth/Throat: Oropharynx is clear and moist.  Eyes: Conjunctivae are normal. Pupils are equal, round, and reactive to light.  Neck: Normal range of motion. No JVD present. No thyromegaly present.  Cardiovascular: Normal rate, regular rhythm, normal heart sounds and intact distal pulses.  Exam reveals no gallop and no friction rub.   No murmur heard. Pulmonary/Chest: Effort normal and breath sounds normal. No respiratory distress. He  has no wheezes. He has no rales. He exhibits no tenderness.  Abdominal: Soft. Bowel sounds are normal. He exhibits no distension and no mass. There is no tenderness. There is no rebound and no guarding.  Musculoskeletal: Normal range of motion. He exhibits tenderness. He exhibits no edema.  LS is not tender  Lymphadenopathy:    He has no cervical adenopathy.  Neurological: He is alert and oriented to person, place, and time. He has normal reflexes. No cranial nerve deficit. He exhibits normal muscle tone. Coordination normal.  Skin: Skin is warm and dry. No rash noted.  Psychiatric: He has a normal mood and affect. His behavior is normal. Judgment and thought content normal.  R eyebrow scar  - thick, tender and deforming R shoulder hurts a lot w/ROM, R AC is tender R biceps area is w/a bruise  Lab Results  Component Value Date   WBC 7.3 04/18/2012   HGB 16.7 04/10/2013   HCT 49.0 04/10/2013   PLT 220.0 04/18/2012   GLUCOSE 129* 04/10/2013   CHOL 173 04/18/2012   TRIG 125.0 04/18/2012   HDL 33.10* 04/18/2012   LDLDIRECT 147.1 07/04/2009   LDLCALC 115* 04/18/2012   ALT 30 04/18/2012   AST 28 04/18/2012   NA 142 04/10/2013   K 3.9 04/10/2013   CL 105 04/10/2013   CREATININE 1.10 04/10/2013   BUN 26* 04/10/2013   CO2 29 04/18/2012   TSH 1.74 04/18/2012   PSA 0.37 04/18/2012          Assessment & Plan:
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
## 2013-05-05 NOTE — Assessment & Plan Note (Signed)
Start PT 

## 2013-05-05 NOTE — Patient Instructions (Signed)
BP Readings from Last 3 Encounters:  05/05/13 162/94  04/14/13 182/90  04/14/13 182/90    Wt Readings from Last 3 Encounters:  05/05/13 213 lb (96.616 kg)  04/14/13 218 lb (98.884 kg)  04/14/13 218 lb (98.884 kg)

## 2013-05-05 NOTE — Assessment & Plan Note (Signed)
We discussed age appropriate health related issues, including available/recomended screening tests and vaccinations. We discussed a need for adhering to healthy diet and exercise. Labs/EKG were reviewed/ordered. All questions were answered. He declined a flu shot

## 2013-05-05 NOTE — Assessment & Plan Note (Signed)
Doing well 

## 2013-05-05 NOTE — Progress Notes (Signed)
Pre visit review using our clinic review tool, if applicable. No additional management support is needed unless otherwise documented below in the visit note. 

## 2013-05-05 NOTE — Assessment & Plan Note (Signed)
Start PT F/u w/Dr Katrinka Blazing

## 2013-05-05 NOTE — Assessment & Plan Note (Signed)
Healed

## 2013-05-06 NOTE — Assessment & Plan Note (Signed)
Resolved

## 2013-05-08 ENCOUNTER — Other Ambulatory Visit (INDEPENDENT_AMBULATORY_CARE_PROVIDER_SITE_OTHER): Payer: Medicare Other

## 2013-05-08 DIAGNOSIS — Z0000 Encounter for general adult medical examination without abnormal findings: Secondary | ICD-10-CM

## 2013-05-08 DIAGNOSIS — N320 Bladder-neck obstruction: Secondary | ICD-10-CM

## 2013-05-08 DIAGNOSIS — I10 Essential (primary) hypertension: Secondary | ICD-10-CM

## 2013-05-08 LAB — HEPATIC FUNCTION PANEL
ALT: 46 U/L (ref 0–53)
AST: 29 U/L (ref 0–37)
Albumin: 4 g/dL (ref 3.5–5.2)
Alkaline Phosphatase: 106 U/L (ref 39–117)
Bilirubin, Direct: 0.2 mg/dL (ref 0.0–0.3)
Total Bilirubin: 1.4 mg/dL — ABNORMAL HIGH (ref 0.3–1.2)
Total Protein: 7.2 g/dL (ref 6.0–8.3)

## 2013-05-08 LAB — CBC WITH DIFFERENTIAL/PLATELET
Basophils Absolute: 0.1 10*3/uL (ref 0.0–0.1)
Basophils Relative: 0.7 % (ref 0.0–3.0)
Eosinophils Absolute: 0.2 10*3/uL (ref 0.0–0.7)
Eosinophils Relative: 2.2 % (ref 0.0–5.0)
HCT: 46.8 % (ref 39.0–52.0)
Hemoglobin: 16.1 g/dL (ref 13.0–17.0)
Lymphocytes Relative: 25.6 % (ref 12.0–46.0)
Lymphs Abs: 2.4 10*3/uL (ref 0.7–4.0)
MCHC: 34.5 g/dL (ref 30.0–36.0)
MCV: 85.3 fl (ref 78.0–100.0)
Monocytes Absolute: 0.9 10*3/uL (ref 0.1–1.0)
Monocytes Relative: 9.1 % (ref 3.0–12.0)
Neutro Abs: 6 10*3/uL (ref 1.4–7.7)
Neutrophils Relative %: 62.4 % (ref 43.0–77.0)
Platelets: 215 10*3/uL (ref 150.0–400.0)
RBC: 5.48 Mil/uL (ref 4.22–5.81)
RDW: 14.7 % — ABNORMAL HIGH (ref 11.5–14.6)
WBC: 9.6 10*3/uL (ref 4.5–10.5)

## 2013-05-08 LAB — BASIC METABOLIC PANEL
BUN: 24 mg/dL — ABNORMAL HIGH (ref 6–23)
CO2: 30 mEq/L (ref 19–32)
Calcium: 9.5 mg/dL (ref 8.4–10.5)
Chloride: 103 mEq/L (ref 96–112)
Creatinine, Ser: 1.1 mg/dL (ref 0.4–1.5)
GFR: 68.12 mL/min (ref >60.00)
Glucose, Bld: 130 mg/dL — ABNORMAL HIGH (ref 70–99)
Potassium: 5.1 mEq/L (ref 3.5–5.1)
Sodium: 139 mEq/L (ref 135–145)

## 2013-05-08 LAB — URINALYSIS
Bilirubin Urine: NEGATIVE
Hgb urine dipstick: NEGATIVE
Ketones, ur: NEGATIVE
Leukocytes, UA: NEGATIVE
Nitrite: NEGATIVE
Specific Gravity, Urine: >=1.03
Total Protein, Urine: NEGATIVE
Urine Glucose: NEGATIVE
Urobilinogen, UA: 0.2
pH: 6 (ref 5.0–8.0)

## 2013-05-08 LAB — LIPID PANEL
Cholesterol: 217 mg/dL — ABNORMAL HIGH (ref 0–200)
HDL: 47.7 mg/dL (ref >39.00)
Total CHOL/HDL Ratio: 5
Triglycerides: 84 mg/dL (ref 0.0–149.0)
VLDL: 16.8 mg/dL (ref 0.0–40.0)

## 2013-05-08 LAB — LDL CHOLESTEROL, DIRECT: Direct LDL: 157.3 mg/dL

## 2013-05-08 LAB — PSA: PSA: 0.3 ng/mL (ref 0.10–4.00)

## 2013-05-08 LAB — TSH: TSH: 1.25 u[IU]/mL (ref 0.35–5.50)

## 2013-05-16 ENCOUNTER — Ambulatory Visit: Payer: Medicare Other | Attending: Internal Medicine | Admitting: Physical Therapy

## 2013-05-16 ENCOUNTER — Ambulatory Visit: Payer: Medicare Other | Admitting: Family Medicine

## 2013-05-16 DIAGNOSIS — M25519 Pain in unspecified shoulder: Secondary | ICD-10-CM | POA: Insufficient documentation

## 2013-05-16 DIAGNOSIS — IMO0001 Reserved for inherently not codable concepts without codable children: Secondary | ICD-10-CM | POA: Insufficient documentation

## 2013-05-23 DIAGNOSIS — M6248 Contracture of muscle, other site: Secondary | ICD-10-CM | POA: Insufficient documentation

## 2013-06-12 ENCOUNTER — Encounter: Payer: Self-pay | Admitting: Cardiology

## 2013-06-12 ENCOUNTER — Ambulatory Visit (INDEPENDENT_AMBULATORY_CARE_PROVIDER_SITE_OTHER): Payer: Medicare Other | Admitting: Cardiology

## 2013-06-12 ENCOUNTER — Telehealth: Payer: Self-pay | Admitting: Cardiology

## 2013-06-12 VITALS — BP 144/88 | HR 67 | Ht 74.0 in | Wt 213.0 lb

## 2013-06-12 DIAGNOSIS — IMO0001 Reserved for inherently not codable concepts without codable children: Secondary | ICD-10-CM

## 2013-06-12 DIAGNOSIS — R030 Elevated blood-pressure reading, without diagnosis of hypertension: Secondary | ICD-10-CM

## 2013-06-12 DIAGNOSIS — R9431 Abnormal electrocardiogram [ECG] [EKG]: Secondary | ICD-10-CM

## 2013-06-12 DIAGNOSIS — R55 Syncope and collapse: Secondary | ICD-10-CM

## 2013-06-12 DIAGNOSIS — E785 Hyperlipidemia, unspecified: Secondary | ICD-10-CM

## 2013-06-12 DIAGNOSIS — Z5189 Encounter for other specified aftercare: Secondary | ICD-10-CM

## 2013-06-12 DIAGNOSIS — S060X9D Concussion with loss of consciousness of unspecified duration, subsequent encounter: Secondary | ICD-10-CM

## 2013-06-12 NOTE — Telephone Encounter (Signed)
Follow Up  Pt returned call// he says he received a call instructing him to contract triage nurse.Marland Kitchen Please assist

## 2013-06-12 NOTE — Patient Instructions (Signed)
Your physician recommends that you continue on your current medications as directed. Please refer to the Current Medication list given to you today.  Your physician has requested that you have an echocardiogram. Echocardiography is a painless test that uses sound waves to create images of your heart. It provides your doctor with information about the size and shape of your heart and how well your heart's chambers and valves are working. This procedure takes approximately one hour. There are no restrictions for this procedure.  Your physician has requested that you have en exercise stress myoview. For further information please visit [email or web address] Please follow instruction sheet, as given.  Your physician recommends that you schedule a follow-up appointment pending results of echo/stress myoview

## 2013-06-12 NOTE — Telephone Encounter (Signed)
Returned call to patient he stated he was returning a call to triage.Patient advised no message for him to return call.Advised to keep appointment with Dr.McLean today at 12:45.

## 2013-06-13 DIAGNOSIS — E785 Hyperlipidemia, unspecified: Secondary | ICD-10-CM | POA: Insufficient documentation

## 2013-06-13 DIAGNOSIS — R55 Syncope and collapse: Secondary | ICD-10-CM | POA: Insufficient documentation
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
## 2013-06-17 ENCOUNTER — Other Ambulatory Visit: Payer: Self-pay | Admitting: Internal Medicine

## 2013-06-28 ENCOUNTER — Ambulatory Visit (HOSPITAL_COMMUNITY): Payer: Medicare HMO | Attending: Cardiovascular Disease | Admitting: Radiology

## 2013-06-28 ENCOUNTER — Encounter: Payer: Self-pay | Admitting: Cardiovascular Disease

## 2013-06-28 DIAGNOSIS — R55 Syncope and collapse: Secondary | ICD-10-CM | POA: Insufficient documentation

## 2013-06-28 DIAGNOSIS — E785 Hyperlipidemia, unspecified: Secondary | ICD-10-CM | POA: Insufficient documentation

## 2013-06-28 DIAGNOSIS — Z87891 Personal history of nicotine dependence: Secondary | ICD-10-CM | POA: Insufficient documentation

## 2013-06-28 NOTE — Progress Notes (Signed)
Echocardiogram performed.  

## 2013-06-29 ENCOUNTER — Ambulatory Visit (HOSPITAL_COMMUNITY): Payer: Medicare HMO | Attending: Cardiology | Admitting: Radiology

## 2013-06-29 VITALS — BP 181/80 | HR 58 | Ht 74.0 in | Wt 208.0 lb

## 2013-06-29 DIAGNOSIS — I10 Essential (primary) hypertension: Secondary | ICD-10-CM | POA: Insufficient documentation

## 2013-06-29 DIAGNOSIS — I779 Disorder of arteries and arterioles, unspecified: Secondary | ICD-10-CM | POA: Insufficient documentation

## 2013-06-29 DIAGNOSIS — R0609 Other forms of dyspnea: Secondary | ICD-10-CM | POA: Insufficient documentation

## 2013-06-29 DIAGNOSIS — R0989 Other specified symptoms and signs involving the circulatory and respiratory systems: Secondary | ICD-10-CM | POA: Insufficient documentation

## 2013-06-29 DIAGNOSIS — Z01810 Encounter for preprocedural cardiovascular examination: Secondary | ICD-10-CM | POA: Insufficient documentation

## 2013-06-29 DIAGNOSIS — R55 Syncope and collapse: Secondary | ICD-10-CM | POA: Insufficient documentation

## 2013-06-29 DIAGNOSIS — Z87891 Personal history of nicotine dependence: Secondary | ICD-10-CM | POA: Insufficient documentation

## 2013-06-29 DIAGNOSIS — I739 Peripheral vascular disease, unspecified: Secondary | ICD-10-CM | POA: Insufficient documentation

## 2013-06-29 DIAGNOSIS — E785 Hyperlipidemia, unspecified: Secondary | ICD-10-CM | POA: Insufficient documentation

## 2013-06-29 DIAGNOSIS — R0602 Shortness of breath: Secondary | ICD-10-CM

## 2013-06-29 DIAGNOSIS — R9431 Abnormal electrocardiogram [ECG] [EKG]: Secondary | ICD-10-CM

## 2013-06-29 MED ORDER — TECHNETIUM TC 99M SESTAMIBI GENERIC - CARDIOLITE
11.0000 | Freq: Once | INTRAVENOUS | Status: AC | PRN
Start: 2013-06-29 — End: 2013-06-29
  Administered 2013-06-29: 11 via INTRAVENOUS

## 2013-06-29 MED ORDER — TECHNETIUM TC 99M SESTAMIBI GENERIC - CARDIOLITE
33.0000 | Freq: Once | INTRAVENOUS | Status: AC | PRN
  Administered 2013-06-29: 33 via INTRAVENOUS

## 2013-06-29 NOTE — Progress Notes (Addendum)
[facility] [address] [place], [place] [address] [phone]    Cardiology Nuclear Med Study  Francis Sims. is a 73 y.o. male     MRN : [id number]     DOB: Jan 29, 1941  Procedure Date: 06/29/2013  Nuclear Med Background Indication for Stress Test:  Evaluation for Ischemia and Surgical Clearance Eye Surgery- Dr. Theodoro Kos History:  No known CAD, Echo 2015 EF 60-65% Cardiac Risk Factors: Carotid Disease, History of Smoking, Hypertension, Lipids and PVD  Symptoms:  DOE and Syncope   Nuclear Pre-Procedure Caffeine/Decaff Intake:  None NPO After: 9:00pm   Lungs:  clear O2 Sat: 98% on room air. IV 0.9% NS with Angio Cath:  22g  IV Site: R Hand  IV Started by:  Crissie Figures, RN  Chest Size (in):  44 Cup Size: n/a  Height: 6\' 2"  (1.88 m)  Weight:  208 lb (94.348 kg)  BMI:  Body mass index is 26.69 kg/(m^2). Tech Comments:  N/A    Nuclear Med Study 1 or 2 day study: 1 day  Stress Test Type:  Stress  Reading MD: N/A  Order Authorizing Provider:  Loralie Champagne, MD  Resting Radionuclide: Technetium 38m Sestamibi  Resting Radionuclide Dose: 11.0 mCi   Stress Radionuclide:  Technetium 21m Sestamibi  Stress Radionuclide Dose: 33.0 mCi           Stress Protocol Rest HR: 58 Stress HR: 137  Rest BP: 181/80 Stress BP: 235/98  Exercise Time (min): 6:30 METS: 7.7           Dose of Adenosine (mg):  n/a Dose of Lexiscan: n/a mg  Dose of Atropine (mg): n/a Dose of Dobutamine: n/a mcg/kg/min (at max HR)  Stress Test Technologist: Glade Lloyd, BS-ES  Nuclear Technologist:  Charlton Amor, CNMT     Rest Procedure:  Myocardial perfusion imaging was performed at rest 45 minutes following the intravenous administration of Technetium 68m Sestamibi. Rest ECG: NSR 58bpm  Stress Procedure:  The patient exercised on the treadmill utilizing the Bruce Protocol for 6:30 minutes. The patient stopped due to fatigue and denied any chest pain.  Technetium  86m Sestamibi was injected at peak exercise and myocardial perfusion imaging was performed after a brief delay. Stress ECG: Upsloping ST depression diffusely with mild elevation in aVR. Prompt resolution in recovery.   QPS Raw Data Images:  Mild diaphragmatic attenuation.  Normal left ventricular size. Stress Images:  There is a small inferoseptal defect of mild severity seen at both rest and stress consistent with diaphragmatic attenuation.  Rest Images:  There is a small inferoseptal defect of mild severity seen at both rest and stress. Subtraction (SDS):  No evidence of ischemia. Transient Ischemic Dilatation (Normal <1.22):  0.86 Lung/Heart Ratio (Normal <0.45):  0.27  Quantitative Gated Spect Images QGS EDV:  107 ml QGS ESV:  44 ml  Impression Exercise Capacity:  Fair exercise capacity. BP Response:  Hypertensive response to exercise. Clinical Symptoms:  No significant symptoms noted. ECG Impression:  Upsloping ST depression diffusely with mild elevation in aVR. Prompt resolution in recovery. Non diagnostic.  Comparison with Prior Nuclear Study: No images to compare  Overall Impression:  Low risk stress nuclear study with no evidence of ischemia, TID. Marland KitchenHypertensive response to exercise.   LV Ejection Fraction: 59%.  LV Wall Motion:  NL LV Function; NL Wall Motion. Continue to closely monitor and treat HTN. If symptoms worsen or become more worrisome, further cardiac testing may be  warranted.       There is no evidence for ischemia or infarction on perfusion images.  No chest pain.  BP was high.  BP was high in office also.  Would suggest lisinopril 10 mg daily with BMET in 2 wks.  Make sure he has followup in office with me.  Please also give him report of the echocardiogram.   Loralie Champagne 06/30/2013 10:27 AM

## 2013-06-30 ENCOUNTER — Telehealth: Payer: Self-pay | Admitting: *Deleted

## 2013-06-30 NOTE — Telephone Encounter (Signed)
Overall Impression: Low risk stress nuclear study with no evidence of ischemia, TID. Marland KitchenHypertensive response to exercise.  LV Ejection Fraction: 59%. LV Wall Motion: NL LV Function; NL Wall Motion. Continue to closely monitor and treat HTN. If symptoms worsen or become more worrisome, further cardiac testing may be warranted.  There is no evidence for ischemia or infarction on perfusion images. No chest pain. BP was high. BP was high in office also. Would suggest lisinopril 10 mg daily with BMET in 2 wks. Make sure he has followup in office with me. Please also give him report of the echocardiogram.  Loralie Champagne  06/30/2013  10:27 AM----------THE ABOVE WAS RECEIVED AS ADDENDUM NOTIFICATION--------- Called patient and made aware of all of the above.  He doesn't understand the BPs.  States that yesterday at [facility] BP was 127/73 and day before yesterday it was 118/78.  Seems to only be high at doctors office.  He prefers to discuss this further with Dr. Aundra Dubin at his next office visit on 07/13/13 and not start any new medicine at this time.

## 2013-06-30 NOTE — Telephone Encounter (Signed)
Ask him to try to check his BP a few times before he sees me again.

## 2013-06-30 NOTE — Telephone Encounter (Signed)
Message copied by Rodman Key on Fri Jun 30, 2013  2:30 PM ------      Message from: Larey Dresser      Created: Fri Jun 30, 2013  9:23 AM       Normal EF, no significant valvular abnormalities. ------

## 2013-07-03 NOTE — Progress Notes (Signed)
Discussed with patient. Pt states he is opposed to starting BP medication because his BP is always high in MD office but OK other times. He has an appt with Dr Aundra Dubin 07/13/13--he is going to take and record his BP and bring the readings to his appt with Dr Aundra Dubin 07/13/13.

## 2013-07-07 ENCOUNTER — Encounter (HOSPITAL_BASED_OUTPATIENT_CLINIC_OR_DEPARTMENT_OTHER): Payer: Self-pay | Admitting: *Deleted

## 2013-07-07 ENCOUNTER — Other Ambulatory Visit: Payer: Self-pay | Admitting: Plastic Surgery

## 2013-07-07 DIAGNOSIS — M6248 Contracture of muscle, other site: Secondary | ICD-10-CM
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
## 2013-07-12 ENCOUNTER — Ambulatory Visit (HOSPITAL_BASED_OUTPATIENT_CLINIC_OR_DEPARTMENT_OTHER): Payer: Medicare HMO | Admitting: Anesthesiology

## 2013-07-12 ENCOUNTER — Ambulatory Visit (HOSPITAL_BASED_OUTPATIENT_CLINIC_OR_DEPARTMENT_OTHER)
Admission: RE | Admit: 2013-07-12 | Discharge: 2013-07-12 | Disposition: A | Discharge status: Home / Self Care | Payer: Medicare HMO | Source: Ambulatory Visit | Attending: Plastic Surgery | Admitting: Plastic Surgery

## 2013-07-12 ENCOUNTER — Encounter (HOSPITAL_BASED_OUTPATIENT_CLINIC_OR_DEPARTMENT_OTHER): Payer: Medicare HMO | Admitting: Anesthesiology

## 2013-07-12 ENCOUNTER — Encounter (HOSPITAL_BASED_OUTPATIENT_CLINIC_OR_DEPARTMENT_OTHER): Payer: Self-pay | Admitting: *Deleted

## 2013-07-12 ENCOUNTER — Encounter (HOSPITAL_BASED_OUTPATIENT_CLINIC_OR_DEPARTMENT_OTHER)
Admission: RE | Disposition: A | Discharge status: Home / Self Care | Payer: Self-pay | Source: Ambulatory Visit | Attending: Plastic Surgery

## 2013-07-12 DIAGNOSIS — L905 Scar conditions and fibrosis of skin: Secondary | ICD-10-CM | POA: Insufficient documentation

## 2013-07-12 DIAGNOSIS — Z87891 Personal history of nicotine dependence: Secondary | ICD-10-CM | POA: Insufficient documentation

## 2013-07-12 DIAGNOSIS — I10 Essential (primary) hypertension: Secondary | ICD-10-CM | POA: Insufficient documentation

## 2013-07-12 DIAGNOSIS — M6248 Contracture of muscle, other site: Secondary | ICD-10-CM

## 2013-07-12 DIAGNOSIS — K219 Gastro-esophageal reflux disease without esophagitis: Secondary | ICD-10-CM | POA: Insufficient documentation

## 2013-07-12 DIAGNOSIS — H02409 Unspecified ptosis of unspecified eyelid: Secondary | ICD-10-CM | POA: Insufficient documentation

## 2013-07-12 HISTORY — PX: BROW LIFT: SHX178

## 2013-07-12 LAB — POCT HEMOGLOBIN-HEMACUE: Hemoglobin: 15 g/dL (ref 13.0–17.0)

## 2013-07-12 SURGERY — BLEPHAROPLASTY
Anesthesia: General | Site: Face | Laterality: Right

## 2013-07-12 MED ORDER — HYDROMORPHONE HCL PF 1 MG/ML IJ SOLN: 0.2500 mg | INTRAMUSCULAR | Status: DC | PRN

## 2013-07-12 MED ORDER — ONDANSETRON HCL 4 MG/2ML IJ SOLN
INTRAMUSCULAR | Status: DC | PRN
  Administered 2013-07-12: 4 mg via INTRAVENOUS

## 2013-07-12 MED ORDER — DEXAMETHASONE SODIUM PHOSPHATE 4 MG/ML IJ SOLN
INTRAMUSCULAR | Status: DC | PRN
  Administered 2013-07-12: 10 mg via INTRAVENOUS

## 2013-07-12 MED ORDER — BSS IO SOLN
INTRAOCULAR | Status: AC
  Filled 2013-07-12: qty 15

## 2013-07-12 MED ORDER — MIDAZOLAM HCL 5 MG/5ML IJ SOLN
INTRAMUSCULAR | Status: DC | PRN
  Administered 2013-07-12: 1 mg via INTRAVENOUS

## 2013-07-12 MED ORDER — LACTATED RINGERS IV SOLN
INTRAVENOUS | Status: DC
  Administered 2013-07-12: 08:00:00 via INTRAVENOUS

## 2013-07-12 MED ORDER — LIDOCAINE-EPINEPHRINE 1 %-1:100000 IJ SOLN
INTRAMUSCULAR | Status: AC
  Filled 2013-07-12: qty 1

## 2013-07-12 MED ORDER — EPHEDRINE SULFATE 50 MG/ML IJ SOLN
INTRAMUSCULAR | Status: DC | PRN
  Administered 2013-07-12: 15 mg via INTRAVENOUS

## 2013-07-12 MED ORDER — MIDAZOLAM HCL 2 MG/2ML IJ SOLN: 1.0000 mg | INTRAMUSCULAR | Status: DC | PRN

## 2013-07-12 MED ORDER — SUCCINYLCHOLINE CHLORIDE 20 MG/ML IJ SOLN
INTRAMUSCULAR | Status: AC
  Filled 2013-07-12: qty 1

## 2013-07-12 MED ORDER — FENTANYL CITRATE 0.05 MG/ML IJ SOLN
INTRAMUSCULAR | Status: DC | PRN
  Administered 2013-07-12: 100 ug via INTRAVENOUS

## 2013-07-12 MED ORDER — BACITRACIN-POLYMYXIN B 500-10000 UNIT/GM OP OINT
TOPICAL_OINTMENT | OPHTHALMIC | Status: AC
  Filled 2013-07-12: qty 3.5

## 2013-07-12 MED ORDER — MIDAZOLAM HCL 2 MG/2ML IJ SOLN
INTRAMUSCULAR | Status: AC
  Filled 2013-07-12: qty 2

## 2013-07-12 MED ORDER — PROPOFOL 10 MG/ML IV BOLUS
INTRAVENOUS | Status: DC | PRN
  Administered 2013-07-12: 200 mg via INTRAVENOUS

## 2013-07-12 MED ORDER — TOBRAMYCIN-DEXAMETHASONE 0.3-0.1 % OP OINT
TOPICAL_OINTMENT | OPHTHALMIC | Status: AC
  Filled 2013-07-12: qty 3.5

## 2013-07-12 MED ORDER — CIPROFLOXACIN IN D5W 400 MG/200ML IV SOLN
400.0000 mg | INTRAVENOUS | Status: AC
  Administered 2013-07-12: 400 mg via INTRAVENOUS

## 2013-07-12 MED ORDER — PROPOFOL 10 MG/ML IV EMUL
INTRAVENOUS | Status: AC
  Filled 2013-07-12: qty 50

## 2013-07-12 MED ORDER — FENTANYL CITRATE 0.05 MG/ML IJ SOLN: 50.0000 ug | INTRAMUSCULAR | Status: DC | PRN

## 2013-07-12 MED ORDER — FENTANYL CITRATE 0.05 MG/ML IJ SOLN
INTRAMUSCULAR | Status: AC
  Filled 2013-07-12: qty 6

## 2013-07-12 MED ORDER — OXYCODONE HCL 5 MG/5ML PO SOLN: 5.0000 mg | Freq: Once | ORAL | Status: AC | PRN

## 2013-07-12 MED ORDER — LIDOCAINE HCL (CARDIAC) 20 MG/ML IV SOLN
INTRAVENOUS | Status: DC | PRN
  Administered 2013-07-12: 100 mg via INTRAVENOUS

## 2013-07-12 MED ORDER — ONDANSETRON HCL 4 MG/2ML IJ SOLN: 4.0000 mg | Freq: Once | INTRAMUSCULAR | Status: DC | PRN

## 2013-07-12 MED ORDER — OXYCODONE HCL 5 MG PO TABS
5.0000 mg | ORAL_TABLET | Freq: Once | ORAL | Status: AC | PRN
  Administered 2013-07-12: 5 mg via ORAL
  Filled 2013-07-12: qty 1

## 2013-07-12 MED ORDER — CIPROFLOXACIN IN D5W 400 MG/200ML IV SOLN
INTRAVENOUS | Status: AC
  Filled 2013-07-12: qty 200

## 2013-07-12 SURGICAL SUPPLY — 30 items
APPLICATOR COTTON TIP 6IN STRL (MISCELLANEOUS) IMPLANT
APPLICATOR DR MATTHEWS STRL (MISCELLANEOUS) IMPLANT
BANDAGE EYE OVAL (MISCELLANEOUS) ×4 IMPLANT
BLADE SURG 15 STRL LF DISP TIS (BLADE) ×2 IMPLANT
BLADE SURG 15 STRL SS (BLADE) ×2
CORDS BIPOLAR (ELECTRODE) ×2 IMPLANT
COVER MAYO STAND STRL (DRAPES) ×2 IMPLANT
COVER TABLE BACK 60X90 (DRAPES) ×2 IMPLANT
DECANTER SPIKE VIAL GLASS SM (MISCELLANEOUS) IMPLANT
DRAPE U-SHAPE 76X120 STRL (DRAPES) ×2 IMPLANT
ELECT NEEDLE BLADE 2-5/6 (NEEDLE) IMPLANT
ELECT REM PT RETURN 9FT ADLT (ELECTROSURGICAL) ×2
ELECTRODE REM PT RTRN 9FT ADLT (ELECTROSURGICAL) ×1 IMPLANT
GLOVE BIO SURGEON STRL SZ 6.5 (GLOVE) ×4 IMPLANT
GOWN STRL REUS W/ TWL LRG LVL3 (GOWN DISPOSABLE) ×2 IMPLANT
GOWN STRL REUS W/TWL LRG LVL3 (GOWN DISPOSABLE) ×2
NEEDLE 27GAX1X1/2 (NEEDLE) IMPLANT
NEEDLE HYPO 30GX1 BEV (NEEDLE) ×2 IMPLANT
PACK BASIN DAY SURGERY FS (CUSTOM PROCEDURE TRAY) ×2 IMPLANT
PENCIL BUTTON HOLSTER BLD 10FT (ELECTRODE) IMPLANT
SHIELD EYE LENSE ONLY DISP (MISCELLANEOUS) ×2 IMPLANT
SLEEVE SCD COMPRESS KNEE MED (MISCELLANEOUS) IMPLANT
STRIP CLOSURE SKIN 1/2X4 (GAUZE/BANDAGES/DRESSINGS) IMPLANT
STRIP SUTURE WOUND CLOSURE 1/2 (SUTURE) ×2 IMPLANT
SUT MNCRL 6-0 UNDY P1 1X18 (SUTURE) ×2 IMPLANT
SUT MONOCRYL 6-0 P1 1X18 (SUTURE) ×2
SUT PROLENE 6 0 P 1 18 (SUTURE) ×2 IMPLANT
SYR CONTROL 10ML LL (SYRINGE) IMPLANT
TOWEL OR 17X24 6PK STRL BLUE (TOWEL DISPOSABLE) ×4 IMPLANT
TRAY DSU PREP LF (CUSTOM PROCEDURE TRAY) ×2 IMPLANT

## 2013-07-12 NOTE — Op Note (Signed)
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   COMPLICATIONS: None.   INDICATIONS FOR PROCEDURE:  The patient, Francis Sims, is a 73 y.o. male born on [birth year], is here for treatment of a right periorbital scar contracture.   CONSENT:  Informed consent was obtained directly from the patient. Risks, benefits and alternatives were fully discussed. Specific risks including but not limited to bleeding, infection, hematoma, seroma, scarring, pain, implant infection, implant extrusion, capsular contracture, asymmetry, wound healing problems, and need for further surgery were all discussed. The patient did have an ample opportunity to have questions answered to satisfaction.   DESCRIPTION OF PROCEDURE:  The patient was taken to the operating room. SCDs were placed and IV antibiotics were given. The patient's operative site was prepped and draped in a sterile fashion. A time out was performed and all information was confirmed to be correct.  General anesthesia was administered.  The area was marked and local anesthetic injected.  After waiting several minutes for the local to take effect the #15 blade was used.  The blade was used on the eyebrow area to excise the scar and the scar tissue and contracture.  The muscle was intact.  Undermining was done for a distance of 2 mm to decrease the tension.  The deep layers were closed with 6-0 monocryl followed by a running  6-0 monocryl stitch for closure.  Dermabond was then applied.  Attention was then  turned to the medial periorbital area and a Z plasty was drawn.  The scar was excised and the lower flap repositioned superiorly and the superior flap positioned inferior.  The area was closed with 6-0 Monocryl.  Steri strips were applied.  The patient tolerated the procedure well.  There were no complications. The patient was allowed to wake from anesthesia, extubated and taken to the recovery room in satisfactory condition.

## 2013-07-12 NOTE — Anesthesia Preprocedure Evaluation (Signed)
Anesthesia Evaluation  Patient identified by MRN, date of birth, ID band Patient awake    Reviewed: Allergy & Precautions, H&P , NPO status , Patient's Chart, lab work & pertinent test results  History of Anesthesia Complications Negative for: history of anesthetic complications  Airway Mallampati: I TM Distance: >3 FB Neck ROM: Full    Dental  (+) Teeth Intact and Dental Advisory Given   Pulmonary former smoker,  breath sounds clear to auscultation        Cardiovascular hypertension, Pt. on medications Rhythm:Regular Rate:Normal     Neuro/Psych    GI/Hepatic GERD-  Medicated,  Endo/Other    Renal/GU      Musculoskeletal   Abdominal   Peds  Hematology   Anesthesia Other Findings   Reproductive/Obstetrics                           Anesthesia Physical Anesthesia Plan  ASA: II  Anesthesia Plan: General   Post-op Pain Management:    Induction: Intravenous  Airway Management Planned: LMA  Additional Equipment:   Intra-op Plan:   Post-operative Plan: Extubation in OR  Informed Consent: I have reviewed the patients History and Physical, chart, labs and discussed the procedure including the risks, benefits and alternatives for the proposed anesthesia with the patient or authorized representative who has indicated his/her understanding and acceptance.   Dental advisory given  Plan Discussed with: CRNA, Anesthesiologist and Surgeon  Anesthesia Plan Comments:         Anesthesia Quick Evaluation

## 2013-07-12 NOTE — Anesthesia Postprocedure Evaluation (Signed)
  Anesthesia Post-op Note  Patient: Francis Sims.  Procedure(s) Performed: Procedure(s): CONTRACTURE  RELEASE ZPLASTY OF RIGHT EYE, PERIORBITAL AREA WITH REPAIR OF PTOSIS OF RIGHT EYE BROW (Right)  Patient Location: PACU  Anesthesia Type:General  Level of Consciousness: awake, alert  and oriented  Airway and Oxygen Therapy: Patient Spontanous Breathing  Post-op Pain: mild  Post-op Assessment: Post-op Vital signs reviewed  Post-op Vital Signs: Reviewed  Complications: No apparent anesthesia complications

## 2013-07-12 NOTE — Discharge Instructions (Signed)
Keep steri strips in place. May replace steri strips if needed. opth ointment in eye every 8 hrs as needed.   Post Anesthesia Home Care Instructions  Activity: Get plenty of rest for the remainder of the day. A responsible adult should stay with you for 24 hours following the procedure.  For the next 24 hours, DO NOT: -Drive a car -Paediatric nurse -Drink alcoholic beverages -Take any medication unless instructed by your physician -Make any legal decisions or sign important papers.  Meals: Start with liquid foods such as gelatin or soup. Progress to regular foods as tolerated. Avoid greasy, spicy, heavy foods. If nausea and/or vomiting occur, drink only clear liquids until the nausea and/or vomiting subsides. Call your physician if vomiting continues.  Special Instructions/Symptoms: Your throat may feel dry or sore from the anesthesia or the breathing tube placed in your throat during surgery. If this causes discomfort, gargle with warm salt water. The discomfort should disappear within 24 hours.

## 2013-07-12 NOTE — Transfer of Care (Signed)
Immediate Anesthesia Transfer of Care Note  Patient: Francis Sims.  Procedure(s) Performed: Procedure(s): CONTRACTURE  RELEASE ZPLASTY OF RIGHT EYE, PERIORBITAL AREA WITH REPAIR OF PTOSIS OF RIGHT EYE BROW (Right)  Patient Location: PACU  Anesthesia Type:General  Level of Consciousness: awake  Airway & Oxygen Therapy: Patient Spontanous Breathing and Patient connected to face mask oxygen  Post-op Assessment: Report given to PACU RN and Post -op Vital signs reviewed and stable  Post vital signs: Reviewed and stable  Complications: No apparent anesthesia complications
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
## 2013-07-12 NOTE — Anesthesia Procedure Notes (Signed)
Procedure Name: LMA Insertion Date/Time: 07/12/2013 8:34 AM Performed by: Melynda Ripple D Pre-anesthesia Checklist: Patient identified, Emergency Drugs available, Suction available and Patient being monitored Patient Re-evaluated:Patient Re-evaluated prior to inductionOxygen Delivery Method: Circle System Utilized Preoxygenation: Pre-oxygenation with 100% oxygen Intubation Type: IV induction Ventilation: Mask ventilation without difficulty LMA: LMA inserted LMA Size: 5.0 Number of attempts: 1 Airway Equipment and Method: bite block Placement Confirmation: positive ETCO2 Tube secured with: Tape Dental Injury: Teeth and Oropharynx as per pre-operative assessment

## 2013-07-12 NOTE — Brief Op Note (Signed)
07/12/2013  9:25 AM  PATIENT:  Francis Sims.  73 y.o. male  PRE-OPERATIVE DIAGNOSIS:  SCAR CONTRACTURE OF RIGHT EYE  POST-OPERATIVE DIAGNOSIS:  SCAR CONTRACTURE OF RIGHT EYE  PROCEDURE:  Procedure(s): CONTRACTURE  RELEASE ZPLASTY OF RIGHT EYE, PERIORBITAL AREA WITH REPAIR OF PTOSIS OF RIGHT EYE BROW (Right)  SURGEON:  Surgeon(s) and Role:    * Zohra Clavel Sanger, DO - Primary  PHYSICIAN ASSISTANT:   ASSISTANTS: none   ANESTHESIA:   general  EBL:     BLOOD ADMINISTERED:none  DRAINS: none   LOCAL MEDICATIONS USED:  LIDOCAINE   SPECIMEN:  No Specimen  DISPOSITION OF SPECIMEN:  N/A  COUNTS:  YES  TOURNIQUET:  * No tourniquets in log *  DICTATION: .Dragon Dictation  PLAN OF CARE: Discharge to home after PACU  PATIENT DISPOSITION:  PACU - hemodynamically stable.   Delay start of Pharmacological VTE agent (>24hrs) due to surgical blood loss or risk of bleeding: no

## 2013-07-12 NOTE — Interval H&P Note (Signed)
History and Physical Interval Note:  07/12/2013 7:31 AM  Francis Sims.  has presented today for surgery, with the diagnosis of [place]  The various methods of treatment have been discussed with the patient and family. After consideration of risks, benefits and other options for treatment, the patient has consented to  Procedure(s): CONTRACTURE  RELEASE ZPLASTY OF RIGHT EYE, PERIORBITAL AREA WITH REPAIR OF PTOSIS OF RIGHT EYE BROW (Right) as a surgical intervention .  The patient's history has been reviewed, patient examined, no change in status, stable for surgery.  I have reviewed the patient's chart and labs.  Questions were answered to the patient's satisfaction.     SANGER,Athina Fahey

## 2013-07-13 ENCOUNTER — Encounter: Payer: Self-pay | Admitting: Cardiology

## 2013-07-13 ENCOUNTER — Ambulatory Visit (INDEPENDENT_AMBULATORY_CARE_PROVIDER_SITE_OTHER): Payer: Medicare HMO | Admitting: Cardiology

## 2013-07-13 ENCOUNTER — Ambulatory Visit: Payer: Medicare Other | Admitting: Cardiology

## 2013-07-13 VITALS — BP 152/84 | HR 76 | Ht 74.0 in | Wt 214.8 lb

## 2013-07-13 DIAGNOSIS — R55 Syncope and collapse: Secondary | ICD-10-CM

## 2013-07-13 DIAGNOSIS — E785 Hyperlipidemia, unspecified: Secondary | ICD-10-CM

## 2013-07-13 DIAGNOSIS — IMO0001 Reserved for inherently not codable concepts without codable children: Secondary | ICD-10-CM

## 2013-07-13 DIAGNOSIS — R030 Elevated blood-pressure reading, without diagnosis of hypertension: Secondary | ICD-10-CM

## 2013-07-13 DIAGNOSIS — I6529 Occlusion and stenosis of unspecified carotid artery: Secondary | ICD-10-CM

## 2013-07-13 MED ORDER — BACITRACIN-POLYMYXIN B 500-10000 UNIT/GM OP OINT
TOPICAL_OINTMENT | OPHTHALMIC | Status: DC | PRN
  Administered 2013-07-13: 1 via OPHTHALMIC

## 2013-07-13 MED ORDER — LIDOCAINE-EPINEPHRINE 1 %-1:100000 IJ SOLN
INTRAMUSCULAR | Status: DC | PRN
  Administered 2013-07-13: 1 mL

## 2013-07-13 MED ORDER — BSS IO SOLN
INTRAOCULAR | Status: DC | PRN
  Administered 2013-07-13: 15 mL via INTRAOCULAR
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
## 2013-07-13 NOTE — Patient Instructions (Signed)
Your physician recommends that you schedule a follow-up appointment as needed with Dr McLean.  

## 2013-10-06 ENCOUNTER — Other Ambulatory Visit (INDEPENDENT_AMBULATORY_CARE_PROVIDER_SITE_OTHER): Payer: Medicare HMO

## 2013-10-06 ENCOUNTER — Ambulatory Visit (INDEPENDENT_AMBULATORY_CARE_PROVIDER_SITE_OTHER): Payer: Medicare HMO | Admitting: Family Medicine

## 2013-10-06 ENCOUNTER — Encounter: Payer: Self-pay | Admitting: Family Medicine

## 2013-10-06 VITALS — BP 144/80 | HR 74

## 2013-10-06 DIAGNOSIS — M25519 Pain in unspecified shoulder: Secondary | ICD-10-CM

## 2013-10-06 DIAGNOSIS — M7660 Achilles tendinitis, unspecified leg: Secondary | ICD-10-CM

## 2013-10-06 DIAGNOSIS — M6520 Calcific tendinitis, unspecified site: Secondary | ICD-10-CM

## 2013-10-06 DIAGNOSIS — M6528 Calcific tendinitis, other site: Secondary | ICD-10-CM | POA: Insufficient documentation

## 2013-10-06 DIAGNOSIS — M25512 Pain in left shoulder: Secondary | ICD-10-CM

## 2013-10-06 DIAGNOSIS — M722 Plantar fascial fibromatosis: Secondary | ICD-10-CM

## 2013-10-06 MED ORDER — MELOXICAM 15 MG PO TABS: 15.0000 mg | ORAL_TABLET | Freq: Every day | ORAL | Status: DC

## 2013-10-06 NOTE — Progress Notes (Signed)
  [facility] [place], [place] [address] Phone: [phone] Subjective:     CC: foot pain  XTG:GYIRSWNIOE Francis Sims. is a 73 y.o. male coming in with complaint of foot pain. Patient states that this is left-sided. Patient does have a history of plantar fasciitis previously and states that this feels fairly similar. Patient states is worse when he gets up in the morning and takes quite some time and now seems to actually hurt even with ambulation. Patient states it seems to get better without walking. Patient continues to do all his activities of daily living including playing tennis. Patient does like to walk barefoot at home. Patient denies any fevers or chills, denies any numbness or weakness of the extremity.     Past medical history, social, surgical and family history all reviewed in electronic medical record.   Review of Systems: No headache, visual changes, nausea, vomiting, diarrhea, constipation, dizziness, abdominal pain, skin rash, fevers, chills, night sweats, weight loss, swollen lymph nodes, body aches, joint swelling, muscle aches, chest pain, shortness of breath, mood changes.   Objective Blood pressure 144/80, pulse 74, SpO2 96.00%.  General: No apparent distress alert and oriented x3 mood and affect normal, dressed appropriately.  HEENT: Pupils equal, extraocular movements intact  Respiratory: Patient's speak in full sentences and does not appear short of breath  Cardiovascular: No lower extremity edema, non tender, no erythema  Skin: Warm dry intact with no signs of infection or rash on extremities or on axial skeleton.  Abdomen: Soft nontender  Neuro: Cranial nerves II through XII are intact, neurovascularly intact in all extremities with 2+ DTRs and 2+ pulses.  Lymph: No lymphadenopathy of posterior or anterior cervical chain or axillae bilaterally.  Gait short stride secondary to pain MSK:  Non tender with full range  of motion and good stability and symmetric strength and tone of shoulders, elbows, wrist, hip, knee and ankles bilaterally.   foot exam left  Normal inspection with no visable or palpable fat pad atrophy and no visible swelling/erythema. Patient is tender at medial insertion of plantar fascia into calcaneus. Great toe motion: Mild hallux rigidus Arch shape: Pes planus bilaterally Other foot breakdown: Mild breakdown of the transverse arch Contralateral foot is nontender but same architectural structure.  MSK US performed of: Left foot This study was ordered, performed, and interpreted by Charlann Boxer D.O.  Foot/Ankle:   All structures visualized.   Talar dome unremarkable  Ankle mortise without effusion. Peroneus longus and brevis tendons unremarkable on long and transverse views without sheath effusions. Posterior tibialis, flexor hallucis longus, and flexor digitorum longus tendons unremarkable on long and transverse views without sheath effusions. Achilles tendon significant nodule present at insertion. Patient does have calcific changes.  Anterior Talofibular Ligament and Calcaneofibular Ligaments unremarkable and intact. Deltoid Ligament unremarkable and intact. Plantar fascia intact significant hypoechoic changes measuring 1.2 cm in diameter. Patient also has significant Doppler flow especially at the insertion on the bone and appears to have a very small avulsion fracture Power doppler signal normal.  IMPRESSION:  Left-sided calcific Achilles tendinopathy and plantar fasciitis with small calcaneal avulsion fracture..     Impression and Recommendations:     This case required medical decision making of moderate complexity.

## 2013-10-06 NOTE — Patient Instructions (Addendum)
Good to see you Heel lifts in shoes, especially left shoe.   Exercises in handout 3 times a week On a step, drop heels and hold 2 seconds then up on toes and down slow for count of 4 seconds. 30 reps daily first weeks, 2 sets daily 2nd week 3 sets daily thereafter Spenco orthotics online or omega sports- try to get sport insoles.  Rigid sole shoes Dansko, merrell or keen are good examples.  Ice bath 20 minutes at end of day.  meloxicam daily for 10 days then as needed Come back in 3 weeks.

## 2013-10-06 NOTE — Assessment & Plan Note (Signed)
Heelift given, home exercise program, ice pack, anti-inflammatories and followup in 3-4 weeks.

## 2013-10-06 NOTE — Assessment & Plan Note (Signed)
To attempt conservative therapy. We discussed over-the-counter orthotics and proper shoe choices. Patient will avoid being barefoot at home. We discussed icing as well as anti-inflammatories. Patient will try these interventions and come back again in 3-4 weeks. If he continues to have pain we'll consider ultrasound of potential injection. Due to patient's age nitroglycerin is not a possibility.

## 2013-10-26 ENCOUNTER — Ambulatory Visit: Payer: Medicare HMO | Admitting: Family Medicine

## 2013-12-20 ENCOUNTER — Ambulatory Visit: Payer: Medicare HMO | Admitting: Family Medicine

## 2013-12-21 ENCOUNTER — Ambulatory Visit (INDEPENDENT_AMBULATORY_CARE_PROVIDER_SITE_OTHER): Payer: Medicare HMO | Admitting: Family Medicine

## 2013-12-21 ENCOUNTER — Encounter: Payer: Self-pay | Admitting: Family Medicine

## 2013-12-21 VITALS — BP 178/96 | HR 62 | Ht 74.0 in | Wt 212.0 lb

## 2013-12-21 DIAGNOSIS — M7660 Achilles tendinitis, unspecified leg: Secondary | ICD-10-CM

## 2013-12-21 DIAGNOSIS — M6528 Calcific tendinitis, other site: Secondary | ICD-10-CM

## 2013-12-21 DIAGNOSIS — M6520 Calcific tendinitis, unspecified site: Secondary | ICD-10-CM

## 2013-12-21 DIAGNOSIS — M7662 Achilles tendinitis, left leg: Secondary | ICD-10-CM

## 2013-12-21 MED ORDER — NITROGLYCERIN 0.2 MG/HR TD PT24: MEDICATED_PATCH | TRANSDERMAL | Status: DC

## 2013-12-21 NOTE — Patient Instructions (Signed)
You have insertional achilles tendinopathy Tylenol or aleve as needed for pain Lowering/raise on level ground - 3 sets of 5 first once a day;  Advance to one legged calf raises on level ground once a day then eventually on a step with 3 sets of 10.   Can add heel walks, toe walks forward and backward as well Ice bucket 10-15 minutes at end of day - can ice 3-4 times a day. Avoid uneven ground, hills as much as possible. Use heel lifts only if these don't make your pain worse. Consider physical therapy if not improving. Nitro patches cut 1/4th of a patch and change daily for next 6 weeks. Stop this 48 hours prior to using levitra and wait at least 12 hours before restarting patches. Follow up with me or Dr. Oneida Alar in 6 weeks.

## 2013-12-26 ENCOUNTER — Encounter: Payer: Self-pay | Admitting: Family Medicine

## 2013-12-26 NOTE — Assessment & Plan Note (Signed)
insertional.  Shown home rehab exercises to do daily.  Icing, nsaids, try different heel lifts.  He would like to try nitro patches - stressed he cannot take this with levitra - must be off at least a couple days before taking one.  Discussed risks of headache, skin irritation.  Consider physical therapy, increasing dosage if not improving.  F/u in 6 weeks.

## 2013-12-26 NOTE — Progress Notes (Signed)
Patient ID: Francis Ade., male   DOB: September 19, 1940, 73 y.o.   MRN: [id number]  PCP: [name], MD  Subjective:   HPI: Patient is a 73 y.o. male here for left heel pain.  Patient reports he's had left achilles pain for at least 6 weeks (records indicate at least a few months as saw Dr. Tamala Julian for plantar fasciitis and had pain here too). States he has pain posterior ankle with a knot here. Pain worse with tennis, golf, pickleball. Didn't like the heel lifts he was provided. Bought new shoes and using spenco inserts - not noticed a difference. Has been icing, taking aleve. Not doing any rehab exercises for this.  Past Medical History  Diagnosis Date  . Chronic back pain     pinched nerve;buldging disc  . Dry skin     on elbows;uses vasaline and it goes away  . Burping     per pt lots of burping  . Hemorrhoids   . Hx of colonic polyps     23yrs ago  . Nocturia   . PONV (postoperative nausea and vomiting)   . GERD (gastroesophageal reflux disease)     OTC  . Hypertension     Current Outpatient Prescriptions on File Prior to Visit  Medication Sig Dispense Refill  . aspirin EC 81 MG tablet Take 81 mg by mouth daily.      . cholecalciferol (VITAMIN D) 1000 UNITS tablet Take 1,000 Units by mouth daily.        . meloxicam (MOBIC) 15 MG tablet Take 1 tablet (15 mg total) by mouth daily.  30 tablet  0  . Omega-3 Fatty Acids (FISH OIL) 1000 MG CAPS Take 1 capsule by mouth 2 (two) times daily.         No current facility-administered medications on file prior to visit.    Past Surgical History  Procedure Laterality Date  . Cervical fusion  2000    C5,6,7  . Hernia repair  1949  . Colonoscopy    . Cataract extraction  2008    bilateral  . Lumbar laminectomy/decompression microdiscectomy  05/26/2011    Procedure: LUMBAR LAMINECTOMY/DECOMPRESSION MICRODISCECTOMY;  Surgeon: Olga Coaster Kritzer;  Location: [facility];  Service: Neurosurgery;  Laterality: Right;  Right Lumbar  three-four Microdiskectomy  . Back surgery    . Eye surgery    . Tonsillectomy    . Brow lift Right 07/12/2013    Procedure: CONTRACTURE  RELEASE ZPLASTY OF RIGHT EYE, PERIORBITAL AREA WITH REPAIR OF PTOSIS OF RIGHT EYE BROW;  Surgeon: Theodoro Kos, DO;  Location: [facility];  Service: Plastics;  Laterality: Right;    Allergies  Allergen Reactions  . Penicillins Hives and Swelling  . Sulfonamide Derivatives Other (See Comments)    Almost stopped heart in the service..Also, causes hives  . Ibuprofen Itching    History   Social History  . Marital Status: Married    Spouse Name: N/A    Number of Children: N/A  . Years of Education: N/A   Occupational History  . retired [employer]    Social History Main Topics  . Smoking status: Former Research scientist (life sciences)  . Smokeless tobacco: Not on file     Comment: in the 60's quit  . Alcohol Use: No  . Drug Use: No  . Sexual Activity: Yes   Other Topics Concern  . Not on file   Social History Narrative   Regular  Exercise-yes: tennis, work outs  Family History  Problem Relation Age of Onset  . COPD Other   . Anesthesia problems Neg Hx   . Hypotension Neg Hx   . Malignant hyperthermia Neg Hx   . Pseudochol deficiency Neg Hx     BP 178/96  Pulse 62  Ht 6\' 2"  (1.88 m)  Wt 212 lb (96.163 kg)  BMI 27.21 kg/m2  Review of Systems: See HPI above.    Objective:  Physical Exam:  Gen: NAD  Left foot/ankle: Haglunds deformity.  No other bruising, swelling, deformity. FROM ankle with pain on calf raise. TTP insertion of achilles.  No other tenderness. Negative ant drawer and talar tilt.   Negative syndesmotic compression. Thompsons test negative. NV intact distally.    Assessment & Plan:  1. Left achilles tendinopathy - insertional.  Shown home rehab exercises to do daily.  Icing, nsaids, try different heel lifts.  He would like to try nitro patches - stressed he cannot take this with levitra - must be off at least a couple  days before taking one.  Discussed risks of headache, skin irritation.  Consider physical therapy, increasing dosage if not improving.  F/u in 6 weeks.

## 2014-01-18 ENCOUNTER — Encounter: Payer: Self-pay | Admitting: Internal Medicine

## 2014-01-18 ENCOUNTER — Ambulatory Visit (INDEPENDENT_AMBULATORY_CARE_PROVIDER_SITE_OTHER): Payer: Medicare HMO | Admitting: Internal Medicine

## 2014-01-18 VITALS — BP 165/85 | HR 69 | Temp 98.0°F | Wt 214.0 lb

## 2014-01-18 DIAGNOSIS — H9313 Tinnitus, bilateral: Secondary | ICD-10-CM

## 2014-01-18 DIAGNOSIS — L299 Pruritus, unspecified: Secondary | ICD-10-CM

## 2014-01-18 DIAGNOSIS — H6120 Impacted cerumen, unspecified ear: Secondary | ICD-10-CM

## 2014-01-18 DIAGNOSIS — H9319 Tinnitus, unspecified ear: Secondary | ICD-10-CM

## 2014-01-18 DIAGNOSIS — H6123 Impacted cerumen, bilateral: Secondary | ICD-10-CM

## 2014-01-18 MED ORDER — LORATADINE 10 MG PO TABS: 10.0000 mg | ORAL_TABLET | Freq: Every day | ORAL | Status: DC

## 2014-01-18 NOTE — Progress Notes (Signed)
Pre visit review using our clinic review tool, if applicable. No additional management support is needed unless otherwise documented below in the visit note. 

## 2014-01-18 NOTE — Assessment & Plan Note (Signed)
See Procedure 

## 2014-01-18 NOTE — Progress Notes (Signed)
Subjective:    HPI   C/o ringing in the ears x 2 wks, wax C/o skin itching all over x 2-4 weeks - no rash. Benadryl helps  BP Readings from Last 3 Encounters:  01/18/14 165/85  12/21/13 178/96  10/06/13 144/80   Wt Readings from Last 3 Encounters:  01/18/14 214 lb (97.07 kg)  12/21/13 212 lb (96.163 kg)  07/13/13 214 lb 12.8 oz (97.433 kg)      .Review of Systems  Constitutional: Negative for chills, appetite change, fatigue and unexpected weight change.  HENT: Negative for congestion, nosebleeds, sneezing, sore throat and trouble swallowing.   Eyes: Negative for itching and visual disturbance.  Respiratory: Negative for cough.   Cardiovascular: Negative for chest pain, palpitations and leg swelling.  Gastrointestinal: Negative for nausea, diarrhea, blood in stool and abdominal distention.  Genitourinary: Negative for frequency and hematuria.  Musculoskeletal: Positive for arthralgias and joint swelling. Negative for back pain, gait problem and neck pain.  Skin: Negative for rash.  Neurological: Negative for dizziness, tremors, speech difficulty and weakness.  Psychiatric/Behavioral: Negative for sleep disturbance, self-injury, dysphoric mood and agitation. The patient is not nervous/anxious.        Objective:   Physical Exam  Constitutional: He is oriented to person, place, and time. He appears well-developed.  HENT:  Mouth/Throat: Oropharynx is clear and moist.  Eyes: Conjunctivae are normal. Pupils are equal, round, and reactive to light.  Neck: Normal range of motion. No JVD present. No thyromegaly present.  Cardiovascular: Normal rate, regular rhythm, normal heart sounds and intact distal pulses.  Exam reveals no gallop and no friction rub.   No murmur heard. Pulmonary/Chest: Effort normal and breath sounds normal. No respiratory distress. He has no wheezes. He has no rales. He exhibits no tenderness.  Abdominal: Soft. Bowel sounds are normal. He exhibits no  distension and no mass. There is no tenderness. There is no rebound and no guarding.  Musculoskeletal: Normal range of motion. He exhibits tenderness. He exhibits no edema.  LS is not tender  Lymphadenopathy:    He has no cervical adenopathy.  Neurological: He is alert and oriented to person, place, and time. He has normal reflexes. No cranial nerve deficit. He exhibits normal muscle tone. Coordination normal.  Skin: Skin is warm and dry. No rash noted.  Psychiatric: He has a normal mood and affect. His behavior is normal. Judgment and thought content normal.  B wax No rash  Lab Results  Component Value Date   WBC 9.6 05/08/2013   HGB 15.0 07/12/2013   HCT 46.8 05/08/2013   PLT 215.0 05/08/2013   GLUCOSE 130* 05/08/2013   CHOL 217* 05/08/2013   TRIG 84.0 05/08/2013   HDL 47.70 05/08/2013   LDLDIRECT 157.3 05/08/2013   LDLCALC 115* 04/18/2012   ALT 46 05/08/2013   AST 29 05/08/2013   NA 139 05/08/2013   K 5.1 05/08/2013   CL 103 05/08/2013   CREATININE 1.1 05/08/2013   BUN 24* 05/08/2013   CO2 30 05/08/2013   TSH 1.25 05/08/2013   PSA 0.30 05/08/2013     Procedure Note :     Procedure :  Ear irrigation   Indication:  Cerumen impaction B   Risks, including pain, dizziness, eardrum perforation, bleeding, infection and others as well as benefits were explained to the patient in detail. Verbal consent was obtained and the patient agreed to proceed.    We used "The Elephant Ear Irrigation Device" filled with lukewarm water for irrigation. A  large amount wax was recovered. Procedure has also required manual wax removal with an ear loop.   Tolerated well. Complications: None.   Postprocedure instructions :  Call if problems.        Assessment & Plan:

## 2014-01-18 NOTE — Assessment & Plan Note (Signed)
D/c a MVI Remove wax

## 2014-01-18 NOTE — Assessment & Plan Note (Signed)
No rash Claritin/Benadryl Hold MVI Labs if not better

## 2014-02-01 ENCOUNTER — Ambulatory Visit: Payer: Medicare HMO | Admitting: Family Medicine

## 2014-03-19 ENCOUNTER — Encounter: Payer: Self-pay | Admitting: Family

## 2014-03-19 ENCOUNTER — Ambulatory Visit (INDEPENDENT_AMBULATORY_CARE_PROVIDER_SITE_OTHER): Payer: Medicare HMO | Admitting: Family

## 2014-03-19 VITALS — BP 164/110 | HR 64 | Temp 98.2°F | Ht 74.0 in | Wt 216.0 lb

## 2014-03-19 DIAGNOSIS — H6983 Other specified disorders of Eustachian tube, bilateral: Secondary | ICD-10-CM

## 2014-03-19 DIAGNOSIS — H6980 Other specified disorders of Eustachian tube, unspecified ear: Secondary | ICD-10-CM | POA: Insufficient documentation

## 2014-03-19 NOTE — Patient Instructions (Addendum)
Thank you for choosing [facility].  Summary/Instructions:   It appears the sensations you are having are the result of inner ear congestion. Please try an antihistamine such as Zyrtec, claritin, or allegra on a daily basis for about 2 weeks WITHOUT THE D. Also add Flonase 2 sprays per nostril daily.  If symptoms do not improve in 1-2 weeks, please contact me via MyChart and I will refer you to ENT.

## 2014-03-19 NOTE — Assessment & Plan Note (Signed)
Moderate amounts of cerumen present. Start OTC antihistamine without decongestant and take for ~ 2 weeks. Start OTC flonase. If no improvements or if symptoms worsen will consider referral to ENT.

## 2014-03-19 NOTE — Progress Notes (Signed)
Pre visit review using our clinic review tool, if applicable. No additional management support is needed unless otherwise documented below in the visit note. 

## 2014-03-19 NOTE — Progress Notes (Signed)
Subjective:    Patient ID: Francis Ade., male    DOB: 1941/02/24, 73 y.o.   MRN: [id number]  HPI:  Francis Morua. is a 73 y.o. male who presents today for bilateral stopped up ears which has been going on for about 2 months now. Sleeps on side nose would stop up which leads to pressure feeling in his ear with occasional discomfort and tinnuitus.  Right >left.  Relieved by switching sides and would end up sleeping in the chair. Sometimes bothersome during the day. Ceruman impaction removal was completed previously which provided some relief. Denies any other attempts of treatments. Has tried Allegra once but kept him up at night (most likely Allegra-D). Denies attempting any medications on a regular basis. Denies any history of ear trauma or ears feeling like this.      Allergies  Allergen Reactions  . Penicillins Hives and Swelling  . Sulfonamide Derivatives Other (See Comments)    Almost stopped heart in the service..Also, causes hives  . Ibuprofen Itching    Current Outpatient Prescriptions on File Prior to Visit  Medication Sig Dispense Refill  . aspirin EC 81 MG tablet Take 81 mg by mouth daily.      . cholecalciferol (VITAMIN D) 1000 UNITS tablet Take 1,000 Units by mouth daily.        Marland Kitchen loratadine (CLARITIN) 10 MG tablet Take 1 tablet (10 mg total) by mouth daily.  100 tablet  3  . nitroGLYCERIN (MINITRAN) 0.2 mg/hr patch Apply 1/4th patch to affected achilles, change daily  30 patch  1  . Omega-3 Fatty Acids (FISH OIL) 1000 MG CAPS Take 1 capsule by mouth 2 (two) times daily.         No current facility-administered medications on file prior to visit.    Past Medical History  Diagnosis Date  . Chronic back pain     pinched nerve;buldging disc  . Dry skin     on elbows;uses vasaline and it goes away  . Burping     per pt lots of burping  . Hemorrhoids   . Hx of colonic polyps     5yrs ago  . Nocturia   . PONV (postoperative nausea and vomiting)   . GERD  (gastroesophageal reflux disease)     OTC  . Hypertension     family history includes COPD in his other. There is no history of Anesthesia problems, Hypotension, Malignant hyperthermia, or Pseudochol deficiency.  Review of Systems  Constitutional: Negative for fever and chills.  HENT: Positive for congestion, ear pain and tinnitus. Negative for postnasal drip, sinus pressure and sneezing.   Eyes: Negative for itching.  Respiratory: Negative for cough.   Allergic/Immunologic: Negative for environmental allergies.  Neurological: Negative for dizziness and headaches.      Objective:    BP 164/110  Pulse 64  Temp(Src) 98.2 F (36.8 C) (Oral)  Ht 6\' 2"  (1.88 m)  Wt 216 lb (97.977 kg)  BMI 27.72 kg/m2  SpO2 96% Nursing note and vital signs reviewed.  Physical Exam  Constitutional: He is oriented to person, place, and time. He appears well-developed and well-nourished. No distress.  HENT:  Head: Normocephalic.  Right Ear: Hearing, tympanic membrane, external ear and ear canal normal.  Left Ear: Hearing, tympanic membrane, external ear and ear canal normal.  Moderate amount of cerumen noted bilaterally.   Eyes: Pupils are equal, round, and reactive to light.  Neck: Neck supple.  Cardiovascular: Normal rate, regular rhythm  and normal heart sounds.   Pulmonary/Chest: Effort normal and breath sounds normal.  Neurological: He is alert and oriented to person, place, and time.  Skin: Skin is warm and dry.  Psychiatric: He has a normal mood and affect. His behavior is normal. Judgment and thought content normal.        Assessment & Plan:

## 2014-05-07 ENCOUNTER — Ambulatory Visit (INDEPENDENT_AMBULATORY_CARE_PROVIDER_SITE_OTHER): Payer: Medicare HMO | Admitting: Internal Medicine

## 2014-05-07 ENCOUNTER — Encounter: Payer: Self-pay | Admitting: Internal Medicine

## 2014-05-07 VITALS — BP 162/84 | HR 63 | Temp 98.3°F | Ht 74.0 in | Wt 216.0 lb

## 2014-05-07 DIAGNOSIS — R202 Paresthesia of skin: Secondary | ICD-10-CM

## 2014-05-07 DIAGNOSIS — H9313 Tinnitus, bilateral: Secondary | ICD-10-CM

## 2014-05-07 DIAGNOSIS — Z1211 Encounter for screening for malignant neoplasm of colon: Secondary | ICD-10-CM

## 2014-05-07 DIAGNOSIS — N320 Bladder-neck obstruction: Secondary | ICD-10-CM

## 2014-05-07 DIAGNOSIS — R1032 Left lower quadrant pain: Secondary | ICD-10-CM

## 2014-05-07 DIAGNOSIS — Z0000 Encounter for general adult medical examination without abnormal findings: Secondary | ICD-10-CM

## 2014-05-07 DIAGNOSIS — R1030 Lower abdominal pain, unspecified: Secondary | ICD-10-CM

## 2014-05-07 DIAGNOSIS — E559 Vitamin D deficiency, unspecified: Secondary | ICD-10-CM

## 2014-05-07 NOTE — Progress Notes (Signed)
Pre visit review using our clinic review tool, if applicable. No additional management support is needed unless otherwise documented below in the visit note. 

## 2014-05-07 NOTE — Assessment & Plan Note (Signed)
Here for medicare wellness/physical  Diet: heart healthy  Physical activity: not sedentary - very active Depression/mood screen: negative  Hearing: intact to whispered voice  Visual acuity: grossly normal, performs annual eye exam  ADLs: capable  Fall risk: none  Home safety: good  Cognitive evaluation: intact to orientation, naming, recall and repetition  EOL planning: adv directives, full code/ I agree  I have personally reviewed and have noted  1. The patient's medical and social history  2. Their use of alcohol, tobacco or illicit drugs  3. Their current medications and supplements  4. The patient's functional ability including ADL's, fall risks, home safety risks and hearing or visual impairment.  5. Diet and physical activities  6. Evidence for depression or mood disorders    Today patient counseled on age appropriate routine health concerns for screening and prevention, each reviewed and up to date or declined. Immunizations reviewed and up to date or declined. Labs ordered and reviewed. Risk factors for depression reviewed and negative. Hearing function and visual acuity are intact. ADLs screened and addressed as needed. Functional ability and level of safety reviewed and appropriate. Education, counseling and referrals performed based on assessed risks today. Patient provided with a copy of personalized plan for preventive services.        

## 2014-05-07 NOTE — Progress Notes (Signed)
   Subjective:    HPI   The patient is here for a wellness exam.  C/o L groin pain w/movements mainly w/adduction x 2 wks  BP Readings from Last 3 Encounters:  05/07/14 162/84  03/19/14 164/110  01/18/14 165/85   Wt Readings from Last 3 Encounters:  05/07/14 216 lb (97.977 kg)  03/19/14 216 lb (97.977 kg)  01/18/14 214 lb (97.07 kg)      .Review of Systems  Constitutional: Negative for chills, appetite change, fatigue and unexpected weight change.  HENT: Negative for congestion, nosebleeds, sneezing, sore throat and trouble swallowing.   Eyes: Negative for itching and visual disturbance.  Respiratory: Negative for cough.   Cardiovascular: Negative for chest pain, palpitations and leg swelling.  Gastrointestinal: Negative for nausea, diarrhea, blood in stool and abdominal distention.  Genitourinary: Negative for frequency and hematuria.  Musculoskeletal: Positive for joint swelling and arthralgias. Negative for back pain, gait problem and neck pain.  Skin: Negative for rash.  Neurological: Negative for dizziness, tremors, speech difficulty and weakness.  Psychiatric/Behavioral: Negative for sleep disturbance, self-injury, dysphoric mood and agitation. The patient is not nervous/anxious.        Objective:   Physical Exam  Constitutional: He is oriented to person, place, and time. He appears well-developed. No distress.  NAD  HENT:  Mouth/Throat: Oropharynx is clear and moist.  Eyes: Conjunctivae are normal. Pupils are equal, round, and reactive to light.  Neck: Normal range of motion. No JVD present. No thyromegaly present.  Cardiovascular: Normal rate, regular rhythm, normal heart sounds and intact distal pulses.  Exam reveals no gallop and no friction rub.   No murmur heard. Pulmonary/Chest: Effort normal and breath sounds normal. No respiratory distress. He has no wheezes. He has no rales. He exhibits no tenderness.  Abdominal: Soft. Bowel sounds are normal. He  exhibits no distension and no mass. There is no tenderness. There is no rebound and no guarding.  Genitourinary: Rectum normal, prostate normal and penis normal. Guaiac negative stool. No penile tenderness.  Musculoskeletal: Normal range of motion. He exhibits no edema or tenderness.  Lymphadenopathy:    He has no cervical adenopathy.  Neurological: He is alert and oriented to person, place, and time. He has normal reflexes. No cranial nerve deficit. He exhibits normal muscle tone. He displays a negative Romberg sign. Coordination and gait normal.  No meningeal signs  Skin: Skin is warm and dry. No rash noted.  Psychiatric: He has a normal mood and affect. His behavior is normal. Judgment and thought content normal.  L groin is tender over ileo-psoas muscle No hernia B testes are NT  Lab Results  Component Value Date   WBC 9.6 05/08/2013   HGB 15.0 07/12/2013   HCT 46.8 05/08/2013   PLT 215.0 05/08/2013   GLUCOSE 130* 05/08/2013   CHOL 217* 05/08/2013   TRIG 84.0 05/08/2013   HDL 47.70 05/08/2013   LDLDIRECT 157.3 05/08/2013   LDLCALC 115* 04/18/2012   ALT 46 05/08/2013   AST 29 05/08/2013   NA 139 05/08/2013   K 5.1 05/08/2013   CL 103 05/08/2013   CREATININE 1.1 05/08/2013   BUN 24* 05/08/2013   CO2 30 05/08/2013   TSH 1.25 05/08/2013   PSA 0.30 05/08/2013          Assessment & Plan:

## 2014-05-07 NOTE — Assessment & Plan Note (Signed)
ENT ref or other options declined. No hearing loss or dizziness

## 2014-05-07 NOTE — Assessment & Plan Note (Signed)
11/15 ileo-psoas strain -- stretch

## 2014-05-07 NOTE — Patient Instructions (Signed)
[email or web address] search ileo-[email or web address] strain -- stretch    Health Maintenance A healthy lifestyle and preventative care can promote health and wellness.  Maintain regular health, dental, and eye exams.  Eat a healthy diet. Foods like vegetables, fruits, whole grains, low-fat dairy products, and lean protein foods contain the nutrients you need and are low in calories. Decrease your intake of foods high in solid fats, added sugars, and salt. Get information about a proper diet from your health care provider, if necessary.  Regular physical exercise is one of the most important things you can do for your health. Most adults should get at least 150 minutes of moderate-intensity exercise (any activity that increases your heart rate and causes you to sweat) each week. In addition, most adults need muscle-strengthening exercises on 2 or more days a week.   Maintain a healthy weight. The body mass index (BMI) is a screening tool to identify possible weight problems. It provides an estimate of body fat based on height and weight. Your health care provider can find your BMI and can help you achieve or maintain a healthy weight. For males 20 years and older:  A BMI below 18.5 is considered underweight.  A BMI of 18.5 to 24.9 is normal.  A BMI of 25 to 29.9 is considered overweight.  A BMI of 30 and above is considered obese.  Maintain normal blood lipids and cholesterol by exercising and minimizing your intake of saturated fat. Eat a balanced diet with plenty of fruits and vegetables. Blood tests for lipids and cholesterol should begin at age 85 and be repeated every 5 years. If your lipid or cholesterol levels are high, you are over age 25, or you are at high risk for heart disease, you may need your cholesterol levels checked more frequently.Ongoing high lipid and cholesterol levels should be treated with medicines if diet and exercise are not working.  If you smoke, find out from your health care  provider how to quit. If you do not use tobacco, do not start.  Lung cancer screening is recommended for adults aged 56-80 years who are at high risk for developing lung cancer because of a history of smoking. A yearly low-dose CT scan of the lungs is recommended for people who have at least a 30-pack-year history of smoking and are current smokers or have quit within the past 15 years. A pack year of smoking is smoking an average of 1 pack of cigarettes a day for 1 year (for example, a 30-pack-year history of smoking could mean smoking 1 pack a day for 30 years or 2 packs a day for 15 years). Yearly screening should continue until the smoker has stopped smoking for at least 15 years. Yearly screening should be stopped for people who develop a health problem that would prevent them from having lung cancer treatment.  If you choose to drink alcohol, do not have more than 2 drinks per day. One drink is considered to be 12 oz (360 mL) of beer, 5 oz (150 mL) of wine, or 1.5 oz (45 mL) of liquor.  Avoid the use of street drugs. Do not share needles with anyone. Ask for help if you need support or instructions about stopping the use of drugs.  High blood pressure causes heart disease and increases the risk of stroke. Blood pressure should be checked at least every 1-2 years. Ongoing high blood pressure should be treated with medicines if weight loss and exercise are not effective.  If you are  45-79 years old, ask your health care provider if you should take aspirin to prevent heart disease.  Diabetes screening involves taking a blood sample to check your fasting blood sugar level. This should be done once every 3 years after age 45 if you are at a normal weight and without risk factors for diabetes. Testing should be considered at a younger age or be carried out more frequently if you are overweight and have at least 1 risk factor for diabetes.  Colorectal cancer can be detected and often prevented. Most  routine colorectal cancer screening begins at the age of 50 and continues through age 75. However, your health care provider may recommend screening at an earlier age if you have risk factors for colon cancer. On a yearly basis, your health care provider may provide home test kits to check for hidden blood in the stool. A small camera at the end of a tube may be used to directly examine the colon (sigmoidoscopy or colonoscopy) to detect the earliest forms of colorectal cancer. Talk to your health care provider about this at age 50 when routine screening begins. A direct exam of the colon should be repeated every 5-10 years through age 75, unless early forms of precancerous polyps or small growths are found.  People who are at an increased risk for hepatitis B should be screened for this virus. You are considered at high risk for hepatitis B if:  You were born in a country where hepatitis B occurs often. Talk with your health care provider about which countries are considered high risk.  Your parents were born in a high-risk country and you have not received a shot to protect against hepatitis B (hepatitis B vaccine).  You have HIV or AIDS.  You use needles to inject street drugs.  You live with, or have sex with, someone who has hepatitis B.  You are a man who has sex with other men (MSM).  You get hemodialysis treatment.  You take certain medicines for conditions like cancer, organ transplantation, and autoimmune conditions.  Hepatitis C blood testing is recommended for all people born from 1945 through 1965 and any individual with known risk factors for hepatitis C.  Healthy men should no longer receive prostate-specific antigen (PSA) blood tests as part of routine cancer screening. Talk to your health care provider about prostate cancer screening.  Testicular cancer screening is not recommended for adolescents or adult males who have no symptoms. Screening includes self-exam, a health care  provider exam, and other screening tests. Consult with your health care provider about any symptoms you have or any concerns you have about testicular cancer.  Practice safe sex. Use condoms and avoid high-risk sexual practices to reduce the spread of sexually transmitted infections (STIs).  You should be screened for STIs, including gonorrhea and chlamydia if:  You are sexually active and are younger than 24 years.  You are older than 24 years, and your health care provider tells you that you are at risk for this type of infection.  Your sexual activity has changed since you were last screened, and you are at an increased risk for chlamydia or gonorrhea. Ask your health care provider if you are at risk.  If you are at risk of being infected with HIV, it is recommended that you take a prescription medicine daily to prevent HIV infection. This is called pre-exposure prophylaxis (PrEP). You are considered at risk if:  You are a man who has sex with other   men (MSM).  You are a heterosexual man who is sexually active with multiple partners.  You take drugs by injection.  You are sexually active with a partner who has HIV.  Talk with your health care provider about whether you are at high risk of being infected with HIV. If you choose to begin PrEP, you should first be tested for HIV. You should then be tested every 3 months for as long as you are taking PrEP.  Use sunscreen. Apply sunscreen liberally and repeatedly throughout the day. You should seek shade when your shadow is shorter than you. Protect yourself by wearing long sleeves, pants, a wide-brimmed hat, and sunglasses year round whenever you are outdoors.  Tell your health care provider of new moles or changes in moles, especially if there is a change in shape or color. Also, tell your health care provider if a mole is larger than the size of a pencil eraser.  A one-time screening for abdominal aortic aneurysm (AAA) and surgical  repair of large AAAs by ultrasound is recommended for men aged 65-75 years who are current or former smokers.  Stay current with your vaccines (immunizations). Document Released: 11/28/2007 Document Revised: 06/06/2013 Document Reviewed: 10/27/2010 Exit[employer] Patient Information 2015 [employer]. This information is not intended to replace advice given to you by your health care provider. Make sure you discuss any questions you have with your health care provider.  

## 2014-05-08 ENCOUNTER — Telehealth: Payer: Self-pay | Admitting: Internal Medicine

## 2014-05-08 ENCOUNTER — Other Ambulatory Visit (INDEPENDENT_AMBULATORY_CARE_PROVIDER_SITE_OTHER): Payer: Medicare HMO

## 2014-05-08 DIAGNOSIS — R1032 Left lower quadrant pain: Secondary | ICD-10-CM

## 2014-05-08 DIAGNOSIS — E785 Hyperlipidemia, unspecified: Secondary | ICD-10-CM

## 2014-05-08 DIAGNOSIS — E559 Vitamin D deficiency, unspecified: Secondary | ICD-10-CM

## 2014-05-08 DIAGNOSIS — R1030 Lower abdominal pain, unspecified: Secondary | ICD-10-CM

## 2014-05-08 DIAGNOSIS — Z0000 Encounter for general adult medical examination without abnormal findings: Secondary | ICD-10-CM

## 2014-05-08 DIAGNOSIS — I10 Essential (primary) hypertension: Secondary | ICD-10-CM

## 2014-05-08 DIAGNOSIS — Z1211 Encounter for screening for malignant neoplasm of colon: Secondary | ICD-10-CM

## 2014-05-08 DIAGNOSIS — N320 Bladder-neck obstruction: Secondary | ICD-10-CM

## 2014-05-08 DIAGNOSIS — R202 Paresthesia of skin: Secondary | ICD-10-CM

## 2014-05-08 LAB — HEPATIC FUNCTION PANEL
ALT: 26 U/L (ref 0–53)
AST: 26 U/L (ref 0–37)
Albumin: 4.3 g/dL (ref 3.5–5.2)
Alkaline Phosphatase: 78 U/L (ref 39–117)
Bilirubin, Direct: 0.2 mg/dL (ref 0.0–0.3)
Total Bilirubin: 1.6 mg/dL — ABNORMAL HIGH (ref 0.2–1.2)
Total Protein: 6.8 g/dL (ref 6.0–8.3)

## 2014-05-08 LAB — LIPID PANEL
Cholesterol: 175 mg/dL (ref 0–200)
HDL: 31.7 mg/dL — ABNORMAL LOW (ref >39.00)
LDL Cholesterol: 123 mg/dL — ABNORMAL HIGH (ref 0–99)
NonHDL: 143.3
Total CHOL/HDL Ratio: 6
Triglycerides: 100 mg/dL (ref 0.0–149.0)
VLDL: 20 mg/dL (ref 0.0–40.0)

## 2014-05-08 LAB — CBC WITH DIFFERENTIAL/PLATELET
Basophils Absolute: 0 10*3/uL (ref 0.0–0.1)
Basophils Relative: 0.4 % (ref 0.0–3.0)
Eosinophils Absolute: 0.1 10*3/uL (ref 0.0–0.7)
Eosinophils Relative: 1.7 % (ref 0.0–5.0)
HCT: 48.7 % (ref 39.0–52.0)
Hemoglobin: 16 g/dL (ref 13.0–17.0)
Lymphocytes Relative: 36 % (ref 12.0–46.0)
Lymphs Abs: 2.4 10*3/uL (ref 0.7–4.0)
MCHC: 32.8 g/dL (ref 30.0–36.0)
MCV: 86.3 fl (ref 78.0–100.0)
Monocytes Absolute: 0.6 10*3/uL (ref 0.1–1.0)
Monocytes Relative: 9.2 % (ref 3.0–12.0)
Neutro Abs: 3.5 10*3/uL (ref 1.4–7.7)
Neutrophils Relative %: 52.7 % (ref 43.0–77.0)
Platelets: 218 10*3/uL (ref 150.0–400.0)
RBC: 5.65 Mil/uL (ref 4.22–5.81)
RDW: 13.9 % (ref 11.5–15.5)
WBC: 6.6 10*3/uL (ref 4.0–10.5)

## 2014-05-08 LAB — URINALYSIS
Bilirubin Urine: NEGATIVE
Hgb urine dipstick: NEGATIVE
Ketones, ur: NEGATIVE
Leukocytes, UA: NEGATIVE
Nitrite: NEGATIVE
Specific Gravity, Urine: 1.02 (ref 1.000–1.030)
Total Protein, Urine: NEGATIVE
Urine Glucose: NEGATIVE
Urobilinogen, UA: 0.2 (ref 0.0–1.0)
pH: 6 (ref 5.0–8.0)

## 2014-05-08 LAB — BASIC METABOLIC PANEL
BUN: 21 mg/dL (ref 6–23)
CO2: 28 mEq/L (ref 19–32)
Calcium: 9.1 mg/dL (ref 8.4–10.5)
Chloride: 102 mEq/L (ref 96–112)
Creatinine, Ser: 1.1 mg/dL (ref 0.4–1.5)
GFR: 73.13 mL/min (ref >60.00)
Glucose, Bld: 119 mg/dL — ABNORMAL HIGH (ref 70–99)
Potassium: 4.8 mEq/L (ref 3.5–5.1)
Sodium: 139 mEq/L (ref 135–145)

## 2014-05-08 LAB — VITAMIN B12: Vitamin B-12: 904 pg/mL (ref 211–911)

## 2014-05-08 LAB — VITAMIN D 25 HYDROXY (VIT D DEFICIENCY, FRACTURES): VITD: 27.39 ng/mL — ABNORMAL LOW (ref 30.00–100.00)

## 2014-05-08 LAB — TSH: TSH: 1.35 u[IU]/mL (ref 0.35–4.50)

## 2014-05-08 LAB — PSA: PSA: 0.33 ng/mL (ref 0.10–4.00)

## 2014-05-08 NOTE — Telephone Encounter (Signed)
Ok Thx 

## 2014-05-08 NOTE — Telephone Encounter (Signed)
Pt tried to schedule with [facility] for colonscopy and he states they would not schedule him appt as he went to [facility] and one 10 years ago. Can you refer him to another group and let him know please?

## 2014-07-17 ENCOUNTER — Encounter: Payer: Self-pay | Admitting: Internal Medicine

## 2014-07-24 ENCOUNTER — Encounter: Payer: Self-pay | Admitting: Internal Medicine

## 2014-09-04 ENCOUNTER — Other Ambulatory Visit: Payer: Self-pay | Admitting: Radiology

## 2014-09-04 DIAGNOSIS — I6523 Occlusion and stenosis of bilateral carotid arteries: Secondary | ICD-10-CM

## 2014-10-01 ENCOUNTER — Ambulatory Visit (HOSPITAL_COMMUNITY): Payer: Medicare HMO | Attending: Cardiovascular Disease | Admitting: Cardiology

## 2014-10-01 DIAGNOSIS — I6523 Occlusion and stenosis of bilateral carotid arteries: Secondary | ICD-10-CM | POA: Diagnosis not present

## 2014-10-01 NOTE — Progress Notes (Signed)
Carotid duplex performed 

## 2015-02-19 ENCOUNTER — Ambulatory Visit (INDEPENDENT_AMBULATORY_CARE_PROVIDER_SITE_OTHER): Payer: Medicare HMO | Admitting: Family Medicine

## 2015-02-19 ENCOUNTER — Ambulatory Visit (HOSPITAL_BASED_OUTPATIENT_CLINIC_OR_DEPARTMENT_OTHER)
Admission: RE | Admit: 2015-02-19 | Discharge: 2015-02-19 | Disposition: A | Discharge status: Home / Self Care | Payer: Medicare HMO | Source: Ambulatory Visit | Attending: Family Medicine | Admitting: Family Medicine

## 2015-02-19 ENCOUNTER — Other Ambulatory Visit: Payer: Self-pay | Admitting: Family Medicine

## 2015-02-19 ENCOUNTER — Encounter: Payer: Self-pay | Admitting: Family Medicine

## 2015-02-19 VITALS — BP 198/97 | HR 62 | Ht 74.0 in | Wt 215.0 lb

## 2015-02-19 DIAGNOSIS — M7731 Calcaneal spur, right foot: Secondary | ICD-10-CM | POA: Diagnosis not present

## 2015-02-19 DIAGNOSIS — S99921A Unspecified injury of right foot, initial encounter: Secondary | ICD-10-CM | POA: Diagnosis not present

## 2015-02-19 DIAGNOSIS — S8991XA Unspecified injury of right lower leg, initial encounter: Secondary | ICD-10-CM

## 2015-02-19 DIAGNOSIS — M25561 Pain in right knee: Secondary | ICD-10-CM | POA: Insufficient documentation

## 2015-02-19 DIAGNOSIS — M19071 Primary osteoarthritis, right ankle and foot: Secondary | ICD-10-CM | POA: Insufficient documentation

## 2015-02-19 DIAGNOSIS — M79671 Pain in right foot: Secondary | ICD-10-CM

## 2015-02-19 DIAGNOSIS — M7989 Other specified soft tissue disorders: Secondary | ICD-10-CM | POA: Insufficient documentation

## 2015-02-19 DIAGNOSIS — M9250 Juvenile osteochondrosis of tibia and fibula, unspecified leg: Secondary | ICD-10-CM | POA: Diagnosis not present
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
## 2015-02-22 DIAGNOSIS — M25561 Pain in right knee: Secondary | ICD-10-CM | POA: Insufficient documentation

## 2015-02-22 DIAGNOSIS — M79671 Pain in right foot: Secondary | ICD-10-CM | POA: Insufficient documentation
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
## 2015-03-18 ENCOUNTER — Other Ambulatory Visit (INDEPENDENT_AMBULATORY_CARE_PROVIDER_SITE_OTHER): Payer: Medicare HMO

## 2015-03-18 ENCOUNTER — Ambulatory Visit (INDEPENDENT_AMBULATORY_CARE_PROVIDER_SITE_OTHER): Payer: Medicare HMO | Admitting: Internal Medicine

## 2015-03-18 ENCOUNTER — Encounter: Payer: Self-pay | Admitting: Internal Medicine

## 2015-03-18 VITALS — BP 170/94 | HR 58 | Ht 74.0 in | Wt 215.0 lb

## 2015-03-18 DIAGNOSIS — E785 Hyperlipidemia, unspecified: Secondary | ICD-10-CM

## 2015-03-18 DIAGNOSIS — Z0000 Encounter for general adult medical examination without abnormal findings: Secondary | ICD-10-CM | POA: Diagnosis not present

## 2015-03-18 DIAGNOSIS — H9313 Tinnitus, bilateral: Secondary | ICD-10-CM

## 2015-03-18 DIAGNOSIS — N320 Bladder-neck obstruction: Secondary | ICD-10-CM

## 2015-03-18 LAB — BASIC METABOLIC PANEL
BUN: 19 mg/dL (ref 6–23)
CO2: 26 mEq/L (ref 19–32)
Calcium: 9.6 mg/dL (ref 8.4–10.5)
Chloride: 105 mEq/L (ref 96–112)
Creatinine, Ser: 0.94 mg/dL (ref 0.40–1.50)
GFR: 83.8 mL/min (ref >60.00)
Glucose, Bld: 93 mg/dL (ref 70–99)
Potassium: 4.4 mEq/L (ref 3.5–5.1)
Sodium: 141 mEq/L (ref 135–145)

## 2015-03-18 LAB — LIPID PANEL
Cholesterol: 190 mg/dL (ref 0–200)
HDL: 35.9 mg/dL — ABNORMAL LOW (ref >39.00)
LDL Cholesterol: 124 mg/dL — ABNORMAL HIGH (ref 0–99)
NonHDL: 153.67
Total CHOL/HDL Ratio: 5
Triglycerides: 148 mg/dL (ref 0.0–149.0)
VLDL: 29.6 mg/dL (ref 0.0–40.0)

## 2015-03-18 LAB — HEPATIC FUNCTION PANEL
ALT: 25 U/L (ref 0–53)
AST: 23 U/L (ref 0–37)
Albumin: 4.5 g/dL (ref 3.5–5.2)
Alkaline Phosphatase: 77 U/L (ref 39–117)
Bilirubin, Direct: 0.2 mg/dL (ref 0.0–0.3)
Total Bilirubin: 1.3 mg/dL — ABNORMAL HIGH (ref 0.2–1.2)
Total Protein: 7.2 g/dL (ref 6.0–8.3)

## 2015-03-18 LAB — CBC WITH DIFFERENTIAL/PLATELET
Basophils Absolute: 0 10*3/uL (ref 0.0–0.1)
Basophils Relative: 0.3 % (ref 0.0–3.0)
Eosinophils Absolute: 0.1 10*3/uL (ref 0.0–0.7)
Eosinophils Relative: 1.4 % (ref 0.0–5.0)
HCT: 47.5 % (ref 39.0–52.0)
Hemoglobin: 16.1 g/dL (ref 13.0–17.0)
Lymphocytes Relative: 30.5 % (ref 12.0–46.0)
Lymphs Abs: 2.5 10*3/uL (ref 0.7–4.0)
MCHC: 33.9 g/dL (ref 30.0–36.0)
MCV: 84.4 fl (ref 78.0–100.0)
Monocytes Absolute: 0.7 10*3/uL (ref 0.1–1.0)
Monocytes Relative: 8.4 % (ref 3.0–12.0)
Neutro Abs: 4.9 10*3/uL (ref 1.4–7.7)
Neutrophils Relative %: 59.4 % (ref 43.0–77.0)
Platelets: 241 10*3/uL (ref 150.0–400.0)
RBC: 5.63 Mil/uL (ref 4.22–5.81)
RDW: 14.1 % (ref 11.5–15.5)
WBC: 8.2 10*3/uL (ref 4.0–10.5)

## 2015-03-18 LAB — URINALYSIS
Bilirubin Urine: NEGATIVE
Hgb urine dipstick: NEGATIVE
Ketones, ur: NEGATIVE
Leukocytes, UA: NEGATIVE
Nitrite: NEGATIVE
Specific Gravity, Urine: 1.01 (ref 1.000–1.030)
Total Protein, Urine: NEGATIVE
Urine Glucose: NEGATIVE
Urobilinogen, UA: 0.2 (ref 0.0–1.0)
pH: 6 (ref 5.0–8.0)

## 2015-03-18 LAB — PSA: PSA: 0.31 ng/mL (ref 0.10–4.00)

## 2015-03-18 LAB — TSH: TSH: 1.28 u[IU]/mL (ref 0.35–4.50)

## 2015-03-18 MED ORDER — FLUTICASONE PROPIONATE 50 MCG/ACT NA SUSP: 2.0000 | Freq: Every day | NASAL | Status: DC

## 2015-03-18 MED ORDER — TERAZOSIN HCL 2 MG PO CAPS: 2.0000 mg | ORAL_CAPSULE | Freq: Every day | ORAL | Status: DC
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
## 2015-03-18 NOTE — Patient Instructions (Signed)
Preventive Care for Adults A healthy lifestyle and preventive care can promote health and wellness. Preventive health guidelines for men include the following key practices:  A routine yearly physical is a good way to check with your health care provider about your health and preventative screening. It is a chance to share any concerns and updates on your health and to receive a thorough exam.  Visit your dentist for a routine exam and preventative care every 6 months. Brush your teeth twice a day and floss once a day. Good oral hygiene prevents tooth decay and gum disease.  The frequency of eye exams is based on your age, health, family medical history, use of contact lenses, and other factors. Follow your health care provider's recommendations for frequency of eye exams.  Eat a healthy diet. Foods such as vegetables, fruits, whole grains, low-fat dairy products, and lean protein foods contain the nutrients you need without too many calories. Decrease your intake of foods high in solid fats, added sugars, and salt. Eat the right amount of calories for you.Get information about a proper diet from your health care provider, if necessary.  Regular physical exercise is one of the most important things you can do for your health. Most adults should get at least 150 minutes of moderate-intensity exercise (any activity that increases your heart rate and causes you to sweat) each week. In addition, most adults need muscle-strengthening exercises on 2 or more days a week.  Maintain a healthy weight. The body mass index (BMI) is a screening tool to identify possible weight problems. It provides an estimate of body fat based on height and weight. Your health care provider can find your BMI and can help you achieve or maintain a healthy weight.For adults 20 years and older:  A BMI below 18.5 is considered underweight.  A BMI of 18.5 to 24.9 is normal.  A BMI of 25 to 29.9 is considered overweight.  A BMI  of 30 and above is considered obese.  Maintain normal blood lipids and cholesterol levels by exercising and minimizing your intake of saturated fat. Eat a balanced diet with plenty of fruit and vegetables. Blood tests for lipids and cholesterol should begin at age 50 and be repeated every 5 years. If your lipid or cholesterol levels are high, you are over 50, or you are at high risk for heart disease, you may need your cholesterol levels checked more frequently.Ongoing high lipid and cholesterol levels should be treated with medicines if diet and exercise are not working.  If you smoke, find out from your health care provider how to quit. If you do not use tobacco, do not start.  Lung cancer screening is recommended for adults aged 73-80 years who are at high risk for developing lung cancer because of a history of smoking. A yearly low-dose CT scan of the lungs is recommended for people who have at least a 30-pack-year history of smoking and are a current smoker or have quit within the past 15 years. A pack year of smoking is smoking an average of 1 pack of cigarettes a day for 1 year (for example: 1 pack a day for 30 years or 2 packs a day for 15 years). Yearly screening should continue until the smoker has stopped smoking for at least 15 years. Yearly screening should be stopped for people who develop a health problem that would prevent them from having lung cancer treatment.  If you choose to drink alcohol, do not have more than  2 drinks per day. One drink is considered to be 12 ounces (355 mL) of beer, 5 ounces (148 mL) of wine, or 1.5 ounces (44 mL) of liquor.  Avoid use of street drugs. Do not share needles with anyone. Ask for help if you need support or instructions about stopping the use of drugs.  High blood pressure causes heart disease and increases the risk of stroke. Your blood pressure should be checked at least every 1-2 years. Ongoing high blood pressure should be treated with  medicines, if weight loss and exercise are not effective.  If you are 45-79 years old, ask your health care provider if you should take aspirin to prevent heart disease.  Diabetes screening involves taking a blood sample to check your fasting blood sugar level. This should be done once every 3 years, after age 45, if you are within normal weight and without risk factors for diabetes. Testing should be considered at a younger age or be carried out more frequently if you are overweight and have at least 1 risk factor for diabetes.  Colorectal cancer can be detected and often prevented. Most routine colorectal cancer screening begins at the age of 50 and continues through age 75. However, your health care provider may recommend screening at an earlier age if you have risk factors for colon cancer. On a yearly basis, your health care provider may provide home test kits to check for hidden blood in the stool. Use of a small camera at the end of a tube to directly examine the colon (sigmoidoscopy or colonoscopy) can detect the earliest forms of colorectal cancer. Talk to your health care provider about this at age 50, when routine screening begins. Direct exam of the colon should be repeated every 5-10 years through age 75, unless early forms of precancerous polyps or small growths are found.  People who are at an increased risk for hepatitis B should be screened for this virus. You are considered at high risk for hepatitis B if:  You were born in a country where hepatitis B occurs often. Talk with your health care provider about which countries are considered high risk.  Your parents were born in a high-risk country and you have not received a shot to protect against hepatitis B (hepatitis B vaccine).  You have HIV or AIDS.  You use needles to inject street drugs.  You live with, or have sex with, someone who has hepatitis B.  You are a man who has sex with other men (MSM).  You get hemodialysis  treatment.  You take certain medicines for conditions such as cancer, organ transplantation, and autoimmune conditions.  Hepatitis C blood testing is recommended for all people born from 1945 through 1965 and any individual with known risks for hepatitis C.  Practice safe sex. Use condoms and avoid high-risk sexual practices to reduce the spread of sexually transmitted infections (STIs). STIs include gonorrhea, chlamydia, syphilis, trichomonas, herpes, HPV, and human immunodeficiency virus (HIV). Herpes, HIV, and HPV are viral illnesses that have no cure. They can result in disability, cancer, and death.  If you are at risk of being infected with HIV, it is recommended that you take a prescription medicine daily to prevent HIV infection. This is called preexposure prophylaxis (PrEP). You are considered at risk if:  You are a man who has sex with other men (MSM) and have other risk factors.  You are a heterosexual man, are sexually active, and are at increased risk for HIV infection.    You take drugs by injection.  You are sexually active with a partner who has HIV.  Talk with your health care provider about whether you are at high risk of being infected with HIV. If you choose to begin PrEP, you should first be tested for HIV. You should then be tested every 3 months for as long as you are taking PrEP.  A one-time screening for abdominal aortic aneurysm (AAA) and surgical repair of large AAAs by ultrasound are recommended for men ages 32 to 67 years who are current or former smokers.  Healthy men should no longer receive prostate-specific antigen (PSA) blood tests as part of routine cancer screening. Talk with your health care provider about prostate cancer screening.  Testicular cancer screening is not recommended for adult males who have no symptoms. Screening includes self-exam, a health care provider exam, and other screening tests. Consult with your health care provider about any symptoms  you have or any concerns you have about testicular cancer.  Use sunscreen. Apply sunscreen liberally and repeatedly throughout the day. You should seek shade when your shadow is shorter than you. Protect yourself by wearing long sleeves, pants, a wide-brimmed hat, and sunglasses year round, whenever you are outdoors.  Once a month, do a whole-body skin exam, using a mirror to look at the skin on your back. Tell your health care provider about new moles, moles that have irregular borders, moles that are larger than a pencil eraser, or moles that have changed in shape or color.  Stay current with required vaccines (immunizations).  Influenza vaccine. All adults should be immunized every year.  Tetanus, diphtheria, and acellular pertussis (Td, Tdap) vaccine. An adult who has not previously received Tdap or who does not know his vaccine status should receive 1 dose of Tdap. This initial dose should be followed by tetanus and diphtheria toxoids (Td) booster doses every 10 years. Adults with an unknown or incomplete history of completing a 3-dose immunization series with Td-containing vaccines should begin or complete a primary immunization series including a Tdap dose. Adults should receive a Td booster every 10 years.  Varicella vaccine. An adult without evidence of immunity to varicella should receive 2 doses or a second dose if he has previously received 1 dose.  Human papillomavirus (HPV) vaccine. Males aged 68-21 years who have not received the vaccine previously should receive the 3-dose series. Males aged 22-26 years may be immunized. Immunization is recommended through the age of 6 years for any male who has sex with males and did not get any or all doses earlier. Immunization is recommended for any person with an immunocompromised condition through the age of 49 years if he did not get any or all doses earlier. During the 3-dose series, the second dose should be obtained 4-8 weeks after the first  dose. The third dose should be obtained 24 weeks after the first dose and 16 weeks after the second dose.  Zoster vaccine. One dose is recommended for adults aged 50 years or older unless certain conditions are present.  Measles, mumps, and rubella (MMR) vaccine. Adults born before [birth year] generally are considered immune to measles and mumps. Adults born in [birth year] or later should have 1 or more doses of MMR vaccine unless there is a contraindication to the vaccine or there is laboratory evidence of immunity to each of the three diseases. A routine second dose of MMR vaccine should be obtained at least 28 days after the first dose for students attending postsecondary  schools, health care workers, or international travelers. People who received inactivated measles vaccine or an unknown type of measles vaccine during 1963-1967 should receive 2 doses of MMR vaccine. People who received inactivated mumps vaccine or an unknown type of mumps vaccine before 1979 and are at high risk for mumps infection should consider immunization with 2 doses of MMR vaccine. Unvaccinated health care workers born before 1957 who lack laboratory evidence of measles, mumps, or rubella immunity or laboratory confirmation of disease should consider measles and mumps immunization with 2 doses of MMR vaccine or rubella immunization with 1 dose of MMR vaccine.  Pneumococcal 13-valent conjugate (PCV13) vaccine. When indicated, a person who is uncertain of his immunization history and has no record of immunization should receive the PCV13 vaccine. An adult aged 19 years or older who has certain medical conditions and has not been previously immunized should receive 1 dose of PCV13 vaccine. This PCV13 should be followed with a dose of pneumococcal polysaccharide (PPSV23) vaccine. The PPSV23 vaccine dose should be obtained at least 8 weeks after the dose of PCV13 vaccine. An adult aged 19 years or older who has certain medical conditions and  previously received 1 or more doses of PPSV23 vaccine should receive 1 dose of PCV13. The PCV13 vaccine dose should be obtained 1 or more years after the last PPSV23 vaccine dose.  Pneumococcal polysaccharide (PPSV23) vaccine. When PCV13 is also indicated, PCV13 should be obtained first. All adults aged 65 years and older should be immunized. An adult younger than age 65 years who has certain medical conditions should be immunized. Any person who resides in a nursing home or long-term care facility should be immunized. An adult smoker should be immunized. People with an immunocompromised condition and certain other conditions should receive both PCV13 and PPSV23 vaccines. People with human immunodeficiency virus (HIV) infection should be immunized as soon as possible after diagnosis. Immunization during chemotherapy or radiation therapy should be avoided. Routine use of PPSV23 vaccine is not recommended for American Indians, [place] Natives, or people younger than 65 years unless there are medical conditions that require PPSV23 vaccine. When indicated, people who have unknown immunization and have no record of immunization should receive PPSV23 vaccine. One-time revaccination 5 years after the first dose of PPSV23 is recommended for people aged 19-64 years who have chronic kidney failure, nephrotic syndrome, asplenia, or immunocompromised conditions. People who received 1-2 doses of PPSV23 before age 65 years should receive another dose of PPSV23 vaccine at age 65 years or later if at least 5 years have passed since the previous dose. Doses of PPSV23 are not needed for people immunized with PPSV23 at or after age 65 years.  Meningococcal vaccine. Adults with asplenia or persistent complement component deficiencies should receive 2 doses of quadrivalent meningococcal conjugate (MenACWY-D) vaccine. The doses should be obtained at least 2 months apart. Microbiologists working with certain meningococcal bacteria,  military recruits, people at risk during an outbreak, and people who travel to or live in countries with a high rate of meningitis should be immunized. A first-year college student up through age 21 years who is living in a residence hall should receive a dose if he did not receive a dose on or after his 16th birthday. Adults who have certain high-risk conditions should receive one or more doses of vaccine.  Hepatitis A vaccine. Adults who wish to be protected from this disease, have certain high-risk conditions, work with hepatitis A-infected animals, work in hepatitis A research labs, or   travel to or work in countries with a high rate of hepatitis A should be immunized. Adults who were previously unvaccinated and who anticipate close contact with an international adoptee during the first 60 days after arrival in the [place] from a country with a high rate of hepatitis A should be immunized.  Hepatitis B vaccine. Adults should be immunized if they wish to be protected from this disease, have certain high-risk conditions, may be exposed to blood or other infectious body fluids, are household contacts or sex partners of hepatitis B positive people, are clients or workers in certain care facilities, or travel to or work in countries with a high rate of hepatitis B.  Haemophilus influenzae type b (Hib) vaccine. A previously unvaccinated person with asplenia or sickle cell disease or having a scheduled splenectomy should receive 1 dose of Hib vaccine. Regardless of previous immunization, a recipient of a hematopoietic stem cell transplant should receive a 3-dose series 6-12 months after his successful transplant. Hib vaccine is not recommended for adults with HIV infection. Preventive Service / Frequency Ages 52 to 17  Blood pressure check.** / Every 1 to 2 years.  Lipid and cholesterol check.** / Every 5 years beginning at age 69.  Hepatitis C blood test.** / For any individual with known risks for  hepatitis C.  Skin self-exam. / Monthly.  Influenza vaccine. / Every year.  Tetanus, diphtheria, and acellular pertussis (Tdap, Td) vaccine.** / Consult your health care provider. 1 dose of Td every 10 years.  Varicella vaccine.** / Consult your health care provider.  HPV vaccine. / 3 doses over 6 months, if 72 or younger.  Measles, mumps, rubella (MMR) vaccine.** / You need at least 1 dose of MMR if you were born in 1957 or later. You may also need a second dose.  Pneumococcal 13-valent conjugate (PCV13) vaccine.** / Consult your health care provider.  Pneumococcal polysaccharide (PPSV23) vaccine.** / 1 to 2 doses if you smoke cigarettes or if you have certain conditions.  Meningococcal vaccine.** / 1 dose if you are age 35 to 60 years and a Market researcher living in a residence hall, or have one of several medical conditions. You may also need additional booster doses.  Hepatitis A vaccine.** / Consult your health care provider.  Hepatitis B vaccine.** / Consult your health care provider.  Haemophilus influenzae type b (Hib) vaccine.** / Consult your health care provider. Ages 35 to 8  Blood pressure check.** / Every 1 to 2 years.  Lipid and cholesterol check.** / Every 5 years beginning at age 57.  Lung cancer screening. / Every year if you are aged 44-80 years and have a 30-pack-year history of smoking and currently smoke or have quit within the past 15 years. Yearly screening is stopped once you have quit smoking for at least 15 years or develop a health problem that would prevent you from having lung cancer treatment.  Fecal occult blood test (FOBT) of stool. / Every year beginning at age 55 and continuing until age 73. You may not have to do this test if you get a colonoscopy every 10 years.  Flexible sigmoidoscopy** or colonoscopy.** / Every 5 years for a flexible sigmoidoscopy or every 10 years for a colonoscopy beginning at age 28 and continuing until age  1.  Hepatitis C blood test.** / For all people born from [birth year] through 1965 and any individual with known risks for hepatitis C.  Skin self-exam. / Monthly.  Influenza vaccine. / Every  year.  Tetanus, diphtheria, and acellular pertussis (Tdap/Td) vaccine.** / Consult your health care provider. 1 dose of Td every 10 years.  Varicella vaccine.** / Consult your health care provider.  Zoster vaccine.** / 1 dose for adults aged 53 years or older.  Measles, mumps, rubella (MMR) vaccine.** / You need at least 1 dose of MMR if you were born in 1957 or later. You may also need a second dose.  Pneumococcal 13-valent conjugate (PCV13) vaccine.** / Consult your health care provider.  Pneumococcal polysaccharide (PPSV23) vaccine.** / 1 to 2 doses if you smoke cigarettes or if you have certain conditions.  Meningococcal vaccine.** / Consult your health care provider.  Hepatitis A vaccine.** / Consult your health care provider.  Hepatitis B vaccine.** / Consult your health care provider.  Haemophilus influenzae type b (Hib) vaccine.** / Consult your health care provider. Ages 77 and over  Blood pressure check.** / Every 1 to 2 years.  Lipid and cholesterol check.**/ Every 5 years beginning at age 85.  Lung cancer screening. / Every year if you are aged 55-80 years and have a 30-pack-year history of smoking and currently smoke or have quit within the past 15 years. Yearly screening is stopped once you have quit smoking for at least 15 years or develop a health problem that would prevent you from having lung cancer treatment.  Fecal occult blood test (FOBT) of stool. / Every year beginning at age 33 and continuing until age 11. You may not have to do this test if you get a colonoscopy every 10 years.  Flexible sigmoidoscopy** or colonoscopy.** / Every 5 years for a flexible sigmoidoscopy or every 10 years for a colonoscopy beginning at age 28 and continuing until age 73.  Hepatitis C blood  test.** / For all people born from [birth year] through 1965 and any individual with known risks for hepatitis C.  Abdominal aortic aneurysm (AAA) screening.** / A one-time screening for ages 50 to 27 years who are current or former smokers.  Skin self-exam. / Monthly.  Influenza vaccine. / Every year.  Tetanus, diphtheria, and acellular pertussis (Tdap/Td) vaccine.** / 1 dose of Td every 10 years.  Varicella vaccine.** / Consult your health care provider.  Zoster vaccine.** / 1 dose for adults aged 34 years or older.  Pneumococcal 13-valent conjugate (PCV13) vaccine.** / Consult your health care provider.  Pneumococcal polysaccharide (PPSV23) vaccine.** / 1 dose for all adults aged 63 years and older.  Meningococcal vaccine.** / Consult your health care provider.  Hepatitis A vaccine.** / Consult your health care provider.  Hepatitis B vaccine.** / Consult your health care provider.  Haemophilus influenzae type b (Hib) vaccine.** / Consult your health care provider. **Family history and personal history of risk and conditions may change your health care provider's recommendations. Document Released: 07/28/2001 Document Revised: 06/06/2013 Document Reviewed: 10/27/2010 [facility] Patient Information 2015 [place], [place]. This information is not intended to replace advice given to you by your health care provider. Make sure you discuss any questions you have with your health care provider.

## 2015-03-18 NOTE — Progress Notes (Signed)
Pre visit review using our clinic review tool, if applicable. No additional management support is needed unless otherwise documented below in the visit note. 

## 2015-03-18 NOTE — Assessment & Plan Note (Signed)
Start Hytrin

## 2015-03-18 NOTE — Assessment & Plan Note (Addendum)
Chronic   Will irrigate ears Treat elevated BP

## 2015-03-26 DIAGNOSIS — Z0100 Encounter for examination of eyes and vision without abnormal findings: Secondary | ICD-10-CM | POA: Diagnosis not present

## 2015-06-11 ENCOUNTER — Encounter: Payer: Self-pay | Admitting: Internal Medicine

## 2015-06-11 ENCOUNTER — Ambulatory Visit (INDEPENDENT_AMBULATORY_CARE_PROVIDER_SITE_OTHER): Payer: Medicare HMO | Admitting: Internal Medicine

## 2015-06-11 VITALS — BP 130/80 | HR 66 | Temp 98.3°F | Resp 20 | Ht 74.0 in | Wt 216.5 lb

## 2015-06-11 DIAGNOSIS — H9313 Tinnitus, bilateral: Secondary | ICD-10-CM | POA: Diagnosis not present

## 2015-06-11 DIAGNOSIS — N320 Bladder-neck obstruction: Secondary | ICD-10-CM | POA: Diagnosis not present

## 2015-06-11 DIAGNOSIS — N529 Male erectile dysfunction, unspecified: Secondary | ICD-10-CM

## 2015-06-11 DIAGNOSIS — IMO0001 Reserved for inherently not codable concepts without codable children: Secondary | ICD-10-CM

## 2015-06-11 DIAGNOSIS — H6123 Impacted cerumen, bilateral: Secondary | ICD-10-CM

## 2015-06-11 DIAGNOSIS — R030 Elevated blood-pressure reading, without diagnosis of hypertension: Secondary | ICD-10-CM

## 2015-06-11 MED ORDER — VARDENAFIL HCL 20 MG PO TABS: 20.0000 mg | ORAL_TABLET | Freq: Every day | ORAL | Status: DC | PRN

## 2015-06-11 NOTE — Assessment & Plan Note (Signed)
Chronic Levitra prn

## 2015-06-11 NOTE — Assessment & Plan Note (Signed)
ENT consult w/Dr Lucia Gaskins - pt will schedule Flonase

## 2015-06-11 NOTE — Progress Notes (Signed)
Pre visit review using our clinic review tool, if applicable. No additional management support is needed unless otherwise documented below in the visit note. 

## 2015-06-11 NOTE — Assessment & Plan Note (Signed)
Pt will see Dr Lucia Gaskins
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
## 2015-06-11 NOTE — Assessment & Plan Note (Signed)
Better on Hytrin 

## 2015-06-11 NOTE — Assessment & Plan Note (Signed)
Removed

## 2015-06-11 NOTE — Assessment & Plan Note (Signed)
On Hytrin - better

## 2015-07-18 ENCOUNTER — Telehealth: Payer: Self-pay

## 2015-07-18 NOTE — Telephone Encounter (Signed)
Patient called to educate on Medicare Wellness apt. LVM for the patient to call back to educate and schedule for wellness visit. Can call office number or [phone]

## 2015-07-22 ENCOUNTER — Telehealth: Payer: Self-pay

## 2015-07-22 NOTE — Telephone Encounter (Signed)
Call to Francis Sims regarding aWV; returned call; has annual physical and that is the only time he comes in. Annual is in Oct; Agreed to come in around [place] for AWV prior to CPE in Oct. Will schedule outreach in Sept.

## 2015-10-30 DIAGNOSIS — Z85828 Personal history of other malignant neoplasm of skin: Secondary | ICD-10-CM | POA: Diagnosis not present

## 2015-10-30 DIAGNOSIS — D1801 Hemangioma of skin and subcutaneous tissue: Secondary | ICD-10-CM | POA: Diagnosis not present

## 2015-10-30 DIAGNOSIS — L918 Other hypertrophic disorders of the skin: Secondary | ICD-10-CM | POA: Diagnosis not present

## 2015-10-30 DIAGNOSIS — L821 Other seborrheic keratosis: Secondary | ICD-10-CM | POA: Diagnosis not present

## 2015-10-30 DIAGNOSIS — L570 Actinic keratosis: Secondary | ICD-10-CM | POA: Diagnosis not present

## 2015-10-30 DIAGNOSIS — D2371 Other benign neoplasm of skin of right lower limb, including hip: Secondary | ICD-10-CM | POA: Diagnosis not present

## 2016-01-30 DIAGNOSIS — R42 Dizziness and giddiness: Secondary | ICD-10-CM | POA: Diagnosis not present

## 2016-01-30 DIAGNOSIS — H903 Sensorineural hearing loss, bilateral: Secondary | ICD-10-CM | POA: Diagnosis not present

## 2016-01-30 DIAGNOSIS — H6121 Impacted cerumen, right ear: Secondary | ICD-10-CM | POA: Diagnosis not present

## 2016-01-30 DIAGNOSIS — H9313 Tinnitus, bilateral: Secondary | ICD-10-CM | POA: Diagnosis not present

## 2016-03-12 DIAGNOSIS — H524 Presbyopia: Secondary | ICD-10-CM | POA: Diagnosis not present

## 2016-03-20 ENCOUNTER — Other Ambulatory Visit (INDEPENDENT_AMBULATORY_CARE_PROVIDER_SITE_OTHER): Payer: Medicare HMO

## 2016-03-20 ENCOUNTER — Encounter: Payer: Self-pay | Admitting: Internal Medicine

## 2016-03-20 ENCOUNTER — Ambulatory Visit (INDEPENDENT_AMBULATORY_CARE_PROVIDER_SITE_OTHER): Payer: Medicare HMO | Admitting: Internal Medicine

## 2016-03-20 VITALS — BP 142/82 | HR 62 | Ht 74.0 in | Wt 213.0 lb

## 2016-03-20 DIAGNOSIS — N320 Bladder-neck obstruction: Secondary | ICD-10-CM

## 2016-03-20 DIAGNOSIS — E785 Hyperlipidemia, unspecified: Secondary | ICD-10-CM

## 2016-03-20 DIAGNOSIS — Z0000 Encounter for general adult medical examination without abnormal findings: Secondary | ICD-10-CM | POA: Diagnosis not present

## 2016-03-20 DIAGNOSIS — R69 Illness, unspecified: Secondary | ICD-10-CM | POA: Diagnosis not present

## 2016-03-20 LAB — CBC WITH DIFFERENTIAL/PLATELET
Basophils Absolute: 0.1 10*3/uL (ref 0.0–0.1)
Basophils Relative: 0.8 % (ref 0.0–3.0)
Eosinophils Absolute: 0.1 10*3/uL (ref 0.0–0.7)
Eosinophils Relative: 1.5 % (ref 0.0–5.0)
HCT: 48.8 % (ref 39.0–52.0)
Hemoglobin: 16.5 g/dL (ref 13.0–17.0)
Lymphocytes Relative: 26.4 % (ref 12.0–46.0)
Lymphs Abs: 2.3 10*3/uL (ref 0.7–4.0)
MCHC: 33.8 g/dL (ref 30.0–36.0)
MCV: 84.5 fl (ref 78.0–100.0)
Monocytes Absolute: 0.9 10*3/uL (ref 0.1–1.0)
Monocytes Relative: 10 % (ref 3.0–12.0)
Neutro Abs: 5.4 10*3/uL (ref 1.4–7.7)
Neutrophils Relative %: 61.3 % (ref 43.0–77.0)
Platelets: 224 10*3/uL (ref 150.0–400.0)
RBC: 5.77 Mil/uL (ref 4.22–5.81)
RDW: 13.8 % (ref 11.5–15.5)
WBC: 8.7 10*3/uL (ref 4.0–10.5)

## 2016-03-20 LAB — BASIC METABOLIC PANEL
BUN: 23 mg/dL (ref 6–23)
CO2: 28 mEq/L (ref 19–32)
Calcium: 9.3 mg/dL (ref 8.4–10.5)
Chloride: 104 mEq/L (ref 96–112)
Creatinine, Ser: 1.07 mg/dL (ref 0.40–1.50)
GFR: 71.96 mL/min (ref >60.00)
Glucose, Bld: 129 mg/dL — ABNORMAL HIGH (ref 70–99)
Potassium: 4.4 mEq/L (ref 3.5–5.1)
Sodium: 140 mEq/L (ref 135–145)

## 2016-03-20 LAB — LIPID PANEL
Cholesterol: 201 mg/dL — ABNORMAL HIGH (ref 0–200)
HDL: 34.7 mg/dL — ABNORMAL LOW (ref >39.00)
LDL Cholesterol: 141 mg/dL — ABNORMAL HIGH (ref 0–99)
NonHDL: 165.85
Total CHOL/HDL Ratio: 6
Triglycerides: 126 mg/dL (ref 0.0–149.0)
VLDL: 25.2 mg/dL (ref 0.0–40.0)

## 2016-03-20 LAB — URINALYSIS
Bilirubin Urine: NEGATIVE
Hgb urine dipstick: NEGATIVE
Ketones, ur: NEGATIVE
Leukocytes, UA: NEGATIVE
Nitrite: NEGATIVE
Specific Gravity, Urine: 1.02 (ref 1.000–1.030)
Total Protein, Urine: NEGATIVE
Urine Glucose: NEGATIVE
Urobilinogen, UA: 0.2 (ref 0.0–1.0)
pH: 5.5 (ref 5.0–8.0)

## 2016-03-20 LAB — HEPATIC FUNCTION PANEL
ALT: 33 U/L (ref 0–53)
AST: 25 U/L (ref 0–37)
Albumin: 4.4 g/dL (ref 3.5–5.2)
Alkaline Phosphatase: 78 U/L (ref 39–117)
Bilirubin, Direct: 0.2 mg/dL (ref 0.0–0.3)
Total Bilirubin: 1.8 mg/dL — ABNORMAL HIGH (ref 0.2–1.2)
Total Protein: 7.2 g/dL (ref 6.0–8.3)

## 2016-03-20 LAB — TSH: TSH: 1.73 u[IU]/mL (ref 0.35–4.50)

## 2016-03-20 LAB — PSA: PSA: 0.32 ng/mL (ref 0.10–4.00)

## 2016-03-20 MED ORDER — VARDENAFIL HCL 20 MG PO TABS: 20.0000 mg | ORAL_TABLET | Freq: Every day | ORAL | 1 refills | Status: DC | PRN

## 2016-03-20 MED ORDER — ESOMEPRAZOLE MAGNESIUM 10 MG PO PACK: 10.0000 mg | PACK | Freq: Every day | ORAL | 5 refills | Status: DC | PRN
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
## 2016-03-20 NOTE — Progress Notes (Signed)
Pre visit review using our clinic review tool, if applicable. No additional management support is needed unless otherwise documented below in the visit note. 

## 2016-03-20 NOTE — Assessment & Plan Note (Signed)
Here for medicare wellness/physical  Diet: heart healthy  Physical activity: not sedentary - very active Depression/mood screen: negative  Hearing: intact to whispered voice  Visual acuity: grossly normal, performs annual eye exam  ADLs: capable  Fall risk: none  Home safety: good  Cognitive evaluation: intact to orientation, naming, recall and repetition  EOL planning: adv directives, full code/ I agree  I have personally reviewed and have noted  1. The patient's medical and social history  2. Their use of alcohol, tobacco or illicit drugs  3. Their current medications and supplements  4. The patient's functional ability including ADL's, fall risks, home safety risks and hearing or visual impairment.  5. Diet and physical activities  6. Evidence for depression or mood disorders    Today patient counseled on age appropriate routine health concerns for screening and prevention, each reviewed and up to date or declined. Immunizations reviewed and up to date or declined. Labs ordered and reviewed. Risk factors for depression reviewed and negative. Hearing function and visual acuity are intact. ADLs screened and addressed as needed. Functional ability and level of safety reviewed and appropriate. Education, counseling and referrals performed based on assessed risks today. Patient provided with a copy of personalized plan for preventive services.        

## 2016-03-20 NOTE — Patient Instructions (Signed)

## 2016-04-08 DIAGNOSIS — R69 Illness, unspecified: Secondary | ICD-10-CM | POA: Diagnosis not present

## 2016-06-19 ENCOUNTER — Encounter: Payer: Self-pay | Admitting: Internal Medicine

## 2016-06-19 ENCOUNTER — Ambulatory Visit (INDEPENDENT_AMBULATORY_CARE_PROVIDER_SITE_OTHER): Payer: Medicare HMO | Admitting: Internal Medicine

## 2016-06-19 DIAGNOSIS — R1032 Left lower quadrant pain: Secondary | ICD-10-CM | POA: Diagnosis not present

## 2016-06-19 MED ORDER — CIPROFLOXACIN HCL 500 MG PO TABS
500.0000 mg | ORAL_TABLET | Freq: Two times a day (BID) | ORAL | 0 refills | Status: DC
Start: 2016-06-19 — End: 2016-08-20

## 2016-06-19 MED ORDER — METRONIDAZOLE 500 MG PO TABS: 500.0000 mg | ORAL_TABLET | Freq: Three times a day (TID) | ORAL | 0 refills | Status: DC

## 2016-06-19 NOTE — Progress Notes (Signed)
Pre visit review using our clinic review tool, if applicable. No additional management support is needed unless otherwise documented below in the visit note. 
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
## 2016-06-19 NOTE — Patient Instructions (Signed)

## 2016-06-19 NOTE — Assessment & Plan Note (Addendum)
1/18 - ?diverticulitis PO ABX Labs, CT abd if not better Low residue diet Colon 2016

## 2016-06-22 ENCOUNTER — Other Ambulatory Visit (INDEPENDENT_AMBULATORY_CARE_PROVIDER_SITE_OTHER): Payer: Medicare HMO

## 2016-06-22 DIAGNOSIS — R1032 Left lower quadrant pain: Secondary | ICD-10-CM | POA: Diagnosis not present

## 2016-06-22 LAB — HEPATIC FUNCTION PANEL
ALT: 43 U/L (ref 0–53)
AST: 35 U/L (ref 0–37)
Albumin: 4.3 g/dL (ref 3.5–5.2)
Alkaline Phosphatase: 73 U/L (ref 39–117)
Bilirubin, Direct: 0.2 mg/dL (ref 0.0–0.3)
Total Bilirubin: 1 mg/dL (ref 0.2–1.2)
Total Protein: 7.4 g/dL (ref 6.0–8.3)

## 2016-06-22 LAB — CBC WITH DIFFERENTIAL/PLATELET
Basophils Absolute: 0 10*3/uL (ref 0.0–0.1)
Basophils Relative: 0.5 % (ref 0.0–3.0)
Eosinophils Absolute: 0.2 10*3/uL (ref 0.0–0.7)
Eosinophils Relative: 2 % (ref 0.0–5.0)
HCT: 47.9 % (ref 39.0–52.0)
Hemoglobin: 16.5 g/dL (ref 13.0–17.0)
Lymphocytes Relative: 33.1 % (ref 12.0–46.0)
Lymphs Abs: 3 10*3/uL (ref 0.7–4.0)
MCHC: 34.5 g/dL (ref 30.0–36.0)
MCV: 83.8 fl (ref 78.0–100.0)
Monocytes Absolute: 0.8 10*3/uL (ref 0.1–1.0)
Monocytes Relative: 9.4 % (ref 3.0–12.0)
Neutro Abs: 4.9 10*3/uL (ref 1.4–7.7)
Neutrophils Relative %: 55 % (ref 43.0–77.0)
Platelets: 232 10*3/uL (ref 150.0–400.0)
RBC: 5.72 Mil/uL (ref 4.22–5.81)
RDW: 14 % (ref 11.5–15.5)
WBC: 8.9 10*3/uL (ref 4.0–10.5)

## 2016-06-22 LAB — URINALYSIS
Bilirubin Urine: NEGATIVE
Hgb urine dipstick: NEGATIVE
Ketones, ur: NEGATIVE
Leukocytes, UA: NEGATIVE
Nitrite: NEGATIVE
Specific Gravity, Urine: 1.02 (ref 1.000–1.030)
Total Protein, Urine: NEGATIVE
Urine Glucose: NEGATIVE
Urobilinogen, UA: 0.2 (ref 0.0–1.0)
pH: 6 (ref 5.0–8.0)

## 2016-06-22 LAB — BASIC METABOLIC PANEL
BUN: 17 mg/dL (ref 6–23)
CO2: 30 mEq/L (ref 19–32)
Calcium: 9.8 mg/dL (ref 8.4–10.5)
Chloride: 103 mEq/L (ref 96–112)
Creatinine, Ser: 1.06 mg/dL (ref 0.40–1.50)
GFR: 72.69 mL/min (ref >60.00)
Glucose, Bld: 103 mg/dL — ABNORMAL HIGH (ref 70–99)
Potassium: 4.5 mEq/L (ref 3.5–5.1)
Sodium: 141 mEq/L (ref 135–145)

## 2016-08-12 DIAGNOSIS — R69 Illness, unspecified: Secondary | ICD-10-CM | POA: Diagnosis not present

## 2016-08-20 ENCOUNTER — Encounter: Payer: Self-pay | Admitting: Internal Medicine

## 2016-08-20 ENCOUNTER — Ambulatory Visit (INDEPENDENT_AMBULATORY_CARE_PROVIDER_SITE_OTHER): Payer: Medicare HMO | Admitting: Internal Medicine

## 2016-08-20 DIAGNOSIS — R142 Eructation: Secondary | ICD-10-CM

## 2016-08-20 DIAGNOSIS — R1012 Left upper quadrant pain: Secondary | ICD-10-CM

## 2016-08-20 DIAGNOSIS — M79671 Pain in right foot: Secondary | ICD-10-CM

## 2016-08-20 DIAGNOSIS — N320 Bladder-neck obstruction: Secondary | ICD-10-CM
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
## 2016-08-20 NOTE — Assessment & Plan Note (Signed)
R>L Will consult Dr Freddy Finner support

## 2016-08-20 NOTE — Assessment & Plan Note (Addendum)
Poss GERD and/or stricture vs other GI ref Dr Benson Norway Re-start Nexium Abd [place]

## 2016-08-20 NOTE — Progress Notes (Signed)
Pre-visit discussion using our clinic review tool. No additional management support is needed unless otherwise documented below in the visit note.  

## 2016-08-20 NOTE — Assessment & Plan Note (Signed)
We can increase Hytrin dose The pt wants to try saw palmetto first PSA was nl RTC 2 mo

## 2016-08-28 ENCOUNTER — Ambulatory Visit
Admission: RE | Admit: 2016-08-28 | Discharge: 2016-08-28 | Disposition: A | Discharge status: Home / Self Care | Payer: Medicare HMO | Source: Ambulatory Visit | Attending: Internal Medicine | Admitting: Internal Medicine

## 2016-08-28 DIAGNOSIS — R109 Unspecified abdominal pain: Secondary | ICD-10-CM | POA: Diagnosis not present

## 2016-09-03 ENCOUNTER — Ambulatory Visit (INDEPENDENT_AMBULATORY_CARE_PROVIDER_SITE_OTHER): Payer: Medicare HMO | Admitting: Sports Medicine

## 2016-09-03 ENCOUNTER — Ambulatory Visit: Payer: Self-pay

## 2016-09-03 ENCOUNTER — Encounter: Payer: Self-pay | Admitting: Sports Medicine

## 2016-09-03 VITALS — BP 163/69 | HR 64 | Ht 74.0 in | Wt 216.0 lb

## 2016-09-03 DIAGNOSIS — M79671 Pain in right foot: Secondary | ICD-10-CM | POA: Diagnosis not present

## 2016-09-03 DIAGNOSIS — M19079 Primary osteoarthritis, unspecified ankle and foot: Secondary | ICD-10-CM | POA: Diagnosis not present
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
## 2016-09-03 NOTE — Assessment & Plan Note (Signed)
Scaphoid pad and heel wedge added to OTC orthotic  Arch strap  After placement he could walk with much less pain and was not in supination  Use arnica gel for topical relief  Reck and consider custom orthotics if not enough relief

## 2016-09-04 ENCOUNTER — Telehealth: Payer: Self-pay | Admitting: Internal Medicine

## 2016-09-04 NOTE — Telephone Encounter (Signed)
FYI: Pt refused to schedule appointment with Dr. Benson Norway

## 2016-09-06 NOTE — Telephone Encounter (Signed)
Noted. Thx.

## 2016-09-07 ENCOUNTER — Ambulatory Visit: Payer: Medicare HMO | Admitting: Sports Medicine

## 2016-10-15 ENCOUNTER — Ambulatory Visit (INDEPENDENT_AMBULATORY_CARE_PROVIDER_SITE_OTHER): Payer: Medicare HMO | Admitting: Sports Medicine

## 2016-10-15 ENCOUNTER — Encounter: Payer: Self-pay | Admitting: Sports Medicine

## 2016-10-15 DIAGNOSIS — M19079 Primary osteoarthritis, unspecified ankle and foot: Secondary | ICD-10-CM

## 2016-10-15 NOTE — Progress Notes (Signed)
Presents in follow-up for custom orthotics. He is doing well with the green inserts but feels as though he needs a little more support. He is doing well with the arch strap but feels as though he needs a little bit bigger of an arch strap.  Patient was fitted for a standard, cushioned, semi-rigid orthotic. The orthotic was heated and afterward the patient stood on the orthotic blank positioned on the orthotic stand. The patient was positioned in subtalar neutral position and 10 degrees of ankle dorsiflexion in a weight bearing stance. After completion of molding, a stable base was applied to the orthotic blank. The blank was ground to a stable position for weight bearing. Size: 13 Base: Blue EVA Additional Posting and Padding: medial heel wedge on right The patient ambulated these, and they were very comfortable.  I spent 40 minutes with this patient, greater than 50% was face-to-face time counseling regarding the below diagnosis.

## 2016-10-15 NOTE — Assessment & Plan Note (Addendum)
Custom orthotics fabricated. Additional arch straps given. Follow up as needed

## 2016-10-29 DIAGNOSIS — D1801 Hemangioma of skin and subcutaneous tissue: Secondary | ICD-10-CM | POA: Diagnosis not present

## 2016-10-29 DIAGNOSIS — Z85828 Personal history of other malignant neoplasm of skin: Secondary | ICD-10-CM | POA: Diagnosis not present

## 2016-10-29 DIAGNOSIS — L570 Actinic keratosis: Secondary | ICD-10-CM | POA: Diagnosis not present

## 2016-10-29 DIAGNOSIS — D2371 Other benign neoplasm of skin of right lower limb, including hip: Secondary | ICD-10-CM | POA: Diagnosis not present

## 2016-10-29 DIAGNOSIS — L821 Other seborrheic keratosis: Secondary | ICD-10-CM | POA: Diagnosis not present

## 2016-10-29 DIAGNOSIS — L820 Inflamed seborrheic keratosis: Secondary | ICD-10-CM | POA: Diagnosis not present

## 2016-10-29 DIAGNOSIS — D485 Neoplasm of uncertain behavior of skin: Secondary | ICD-10-CM | POA: Diagnosis not present

## 2016-11-10 ENCOUNTER — Encounter: Payer: Medicare HMO | Admitting: Sports Medicine

## 2016-11-11 ENCOUNTER — Encounter: Payer: Self-pay | Admitting: Nurse Practitioner

## 2016-11-11 ENCOUNTER — Ambulatory Visit (INDEPENDENT_AMBULATORY_CARE_PROVIDER_SITE_OTHER): Payer: Medicare HMO | Admitting: Nurse Practitioner

## 2016-11-11 VITALS — BP 166/88 | HR 57 | Temp 98.0°F | Ht 74.0 in | Wt 215.0 lb

## 2016-11-11 DIAGNOSIS — H6123 Impacted cerumen, bilateral: Secondary | ICD-10-CM

## 2016-11-11 NOTE — Progress Notes (Signed)
Subjective:  Patient ID: Francis Ade., male    DOB: [birth year]  Age: 76 y.o. MRN: [id number]  CC: Cerumen Impaction (too much wax in ear/hearing center tell him)   Ear Fullness   There is pain in both ears. This is a recurrent problem. The problem occurs constantly. The problem has been unchanged. There has been no fever. Associated symptoms include hearing loss. Pertinent negatives include no abdominal pain, coughing, diarrhea, ear discharge, headaches, neck pain, rash, rhinorrhea, sore throat or vomiting. He has tried nothing for the symptoms.    Outpatient Medications Prior to Visit  Medication Sig Dispense Refill  . aspirin EC 81 MG tablet Take 81 mg by mouth daily.    . cholecalciferol (VITAMIN D) 1000 UNITS tablet Take 1,000 Units by mouth daily.      Marland Kitchen esomeprazole (NEXIUM) 10 MG packet Take 10 mg by mouth daily as needed. 30 each 5  . fluticasone (FLONASE) 50 MCG/ACT nasal spray Place 2 sprays into both nostrils daily. 16 g 6  . loratadine (CLARITIN) 10 MG tablet Take 1 tablet (10 mg total) by mouth daily. 100 tablet 3  . Omega-3 Fatty Acids (FISH OIL) 1000 MG CAPS Take 1 capsule by mouth 2 (two) times daily.      . vardenafil (LEVITRA) 20 MG tablet Take 1 tablet (20 mg total) by mouth daily as needed for erectile dysfunction. 100 tablet 1  . terazosin (HYTRIN) 2 MG capsule Take 1 capsule (2 mg total) by mouth at bedtime. (Patient not taking: Reported on 11/11/2016) 30 capsule 11   No facility-administered medications prior to visit.     ROS See HPI  Objective:  BP (!) 166/88   Pulse (!) 57   Temp 98 F (36.7 C)   Ht 6\' 2"  (1.88 m)   Wt 215 lb (97.5 kg)   SpO2 98%   BMI 27.60 kg/m   BP Readings from Last 3 Encounters:  11/11/16 (!) 166/88  10/15/16 (!) 153/75  09/03/16 (!) 163/69    Wt Readings from Last 3 Encounters:  11/11/16 215 lb (97.5 kg)  10/15/16 215 lb (97.5 kg)  09/03/16 216 lb (98 kg)    Physical Exam  Constitutional: He is oriented to  person, place, and time. No distress.  HENT:  Right Ear: Tympanic membrane, external ear and ear canal normal. No middle ear effusion.  Left Ear: External ear and ear canal normal. A middle ear effusion is present.  Nose: Nose normal.  Mouth/Throat: Oropharynx is clear and moist.  Procedure Note :     Procedure :  Ear irrigation (bilateral)   Indication:  Cerumen impaction (bilateral)   Risks, including pain, dizziness, eardrum perforation, bleeding, infection and others as well as benefits were explained to the patient in detail. Verbal consent was obtained and the patient agreed to proceed.    We used "The Elephant Ear Irrigation Device" filled with lukewarm water for irrigation. A large amount wax was recovered. Procedure has also required manual wax removal with an ear loop.   Tolerated well. Complications: None.   Postprocedure instructions :  Call if problems.  Cardiovascular: Normal rate.   Pulmonary/Chest: Effort normal.  Neurological: He is alert and oriented to person, place, and time.  Vitals reviewed.   Lab Results  Component Value Date   WBC 8.9 06/22/2016   HGB 16.5 06/22/2016   HCT 47.9 06/22/2016   PLT 232.0 06/22/2016   GLUCOSE 103 (H) 06/22/2016   CHOL 201 (H) 03/20/2016  TRIG 126.0 03/20/2016   HDL 34.70 (L) 03/20/2016   LDLDIRECT 157.3 05/08/2013   LDLCALC 141 (H) 03/20/2016   ALT 43 06/22/2016   AST 35 06/22/2016   NA 141 06/22/2016   K 4.5 06/22/2016   CL 103 06/22/2016   CREATININE 1.06 06/22/2016   BUN 17 06/22/2016   CO2 30 06/22/2016   TSH 1.73 03/20/2016   PSA 0.32 03/20/2016    US Abdomen Complete  Result Date: 08/28/2016 CLINICAL DATA:  Three-week history of abdominal pain EXAM: ABDOMEN ULTRASOUND COMPLETE COMPARISON:  None. FINDINGS: Gallbladder: No gallstones or wall thickening visualized. There is no pericholecystic fluid. No sonographic Murphy sign noted by sonographer. Common bile duct: Diameter: 4 mm. There is no intrahepatic,  common hepatic, or common bile duct dilatation. Liver: No focal lesion identified. Liver echogenicity is somewhat inhomogeneous and mildly increased. IVC: No abnormality visualized. Pancreas: Visualized portion unremarkable. Portions of pancreas obscured by gas. Spleen: Size and appearance within normal limits. Right Kidney: Length: 12.3 cm. Echogenicity within normal limits. No mass or hydronephrosis visualized. Left Kidney: Length: 11.8 cm. Echogenicity within normal limits. No hydronephrosis visualized. There is a cyst in the medial left kidney measuring 1.7 x 1.4 x 1.4 cm. Abdominal aorta: No aneurysm visualized. There is atherosclerotic calcification and peripheral plaque in the aorta. Other findings: No evident ascites. IMPRESSION: Inhomogeneous and increased liver echogenicity most likely is due to hepatic steatosis. While no focal liver lesions are evident, it must be cautioned that the sensitivity of ultrasound for detection of focal liver lesions is diminished in this circumstance. Portions of pancreas obscured by gas. Visualized portions of pancreas appear normal. Aortic atherosclerosis without aneurysm. Left renal cyst. Study otherwise unremarkable. Electronically Signed   By: Lowella Grip III M.D.   On: 08/28/2016 10:13    Assessment & Plan:   Francis Sims was seen today for cerumen impaction.  Diagnoses and all orders for this visit:  Bilateral impacted cerumen   I am having Francis Sims maintain his Fish Oil, cholecalciferol, aspirin EC, loratadine, terazosin, fluticasone, vardenafil, and esomeprazole.  No orders of the defined types were placed in this encounter.   Follow-up: Return if symptoms worsen or fail to improve.  Wilfred Lacy, NP

## 2016-12-18 ENCOUNTER — Telehealth: Payer: Self-pay | Admitting: *Deleted

## 2016-12-18 MED ORDER — VARDENAFIL HCL 20 MG PO TABS: 20.0000 mg | ORAL_TABLET | Freq: Every day | ORAL | 1 refills | Status: DC | PRN

## 2016-12-18 NOTE — Telephone Encounter (Signed)
Pt left msg on triage stating needing refill on his Levitra called into speciality pharmacy [facility]) @ [phone]. His order # is [id number]. Verifed cahrt pt is up-to-date called refill into pharmacy had to leave on confidential VM...[name]

## 2017-03-02 DIAGNOSIS — M659 Synovitis and tenosynovitis, unspecified: Secondary | ICD-10-CM | POA: Diagnosis not present

## 2017-03-02 DIAGNOSIS — M7751 Other enthesopathy of right foot: Secondary | ICD-10-CM | POA: Diagnosis not present

## 2017-03-02 DIAGNOSIS — M19071 Primary osteoarthritis, right ankle and foot: Secondary | ICD-10-CM | POA: Diagnosis not present

## 2017-03-02 DIAGNOSIS — G5761 Lesion of plantar nerve, right lower limb: Secondary | ICD-10-CM | POA: Diagnosis not present

## 2017-03-15 DIAGNOSIS — H524 Presbyopia: Secondary | ICD-10-CM | POA: Diagnosis not present

## 2017-03-17 DIAGNOSIS — M7751 Other enthesopathy of right foot: Secondary | ICD-10-CM | POA: Diagnosis not present

## 2017-03-17 DIAGNOSIS — G5761 Lesion of plantar nerve, right lower limb: Secondary | ICD-10-CM | POA: Diagnosis not present

## 2017-03-17 DIAGNOSIS — M659 Synovitis and tenosynovitis, unspecified: Secondary | ICD-10-CM | POA: Diagnosis not present

## 2017-03-23 ENCOUNTER — Other Ambulatory Visit (INDEPENDENT_AMBULATORY_CARE_PROVIDER_SITE_OTHER): Payer: Medicare HMO

## 2017-03-23 ENCOUNTER — Ambulatory Visit (INDEPENDENT_AMBULATORY_CARE_PROVIDER_SITE_OTHER): Payer: Medicare HMO | Admitting: Internal Medicine

## 2017-03-23 ENCOUNTER — Encounter: Payer: Self-pay | Admitting: Internal Medicine

## 2017-03-23 VITALS — BP 154/86 | HR 53 | Temp 98.0°F | Ht 74.0 in | Wt 214.0 lb

## 2017-03-23 DIAGNOSIS — I6523 Occlusion and stenosis of bilateral carotid arteries: Secondary | ICD-10-CM

## 2017-03-23 DIAGNOSIS — R030 Elevated blood-pressure reading, without diagnosis of hypertension: Secondary | ICD-10-CM

## 2017-03-23 DIAGNOSIS — Z0000 Encounter for general adult medical examination without abnormal findings: Secondary | ICD-10-CM

## 2017-03-23 LAB — URINALYSIS
Bilirubin Urine: NEGATIVE
Hgb urine dipstick: NEGATIVE
Ketones, ur: NEGATIVE
Leukocytes, UA: NEGATIVE
Nitrite: NEGATIVE
Specific Gravity, Urine: 1.02 (ref 1.000–1.030)
Total Protein, Urine: NEGATIVE
Urine Glucose: NEGATIVE
Urobilinogen, UA: 0.2 (ref 0.0–1.0)
pH: 6 (ref 5.0–8.0)

## 2017-03-23 LAB — CBC WITH DIFFERENTIAL/PLATELET
Basophils Absolute: 0.1 10*3/uL (ref 0.0–0.1)
Basophils Relative: 0.6 % (ref 0.0–3.0)
Eosinophils Absolute: 0.1 10*3/uL (ref 0.0–0.7)
Eosinophils Relative: 1.4 % (ref 0.0–5.0)
HCT: 50.3 % (ref 39.0–52.0)
Hemoglobin: 16.7 g/dL (ref 13.0–17.0)
Lymphocytes Relative: 27.8 % (ref 12.0–46.0)
Lymphs Abs: 2.3 10*3/uL (ref 0.7–4.0)
MCHC: 33.2 g/dL (ref 30.0–36.0)
MCV: 85.6 fl (ref 78.0–100.0)
Monocytes Absolute: 0.8 10*3/uL (ref 0.1–1.0)
Monocytes Relative: 10.1 % (ref 3.0–12.0)
Neutro Abs: 5 10*3/uL (ref 1.4–7.7)
Neutrophils Relative %: 60.1 % (ref 43.0–77.0)
Platelets: 216 10*3/uL (ref 150.0–400.0)
RBC: 5.88 Mil/uL — ABNORMAL HIGH (ref 4.22–5.81)
RDW: 14.4 % (ref 11.5–15.5)
WBC: 8.3 10*3/uL (ref 4.0–10.5)

## 2017-03-23 LAB — BASIC METABOLIC PANEL
BUN: 16 mg/dL (ref 6–23)
CO2: 30 mEq/L (ref 19–32)
Calcium: 9.5 mg/dL (ref 8.4–10.5)
Chloride: 101 mEq/L (ref 96–112)
Creatinine, Ser: 0.97 mg/dL (ref 0.40–1.50)
GFR: 80.37 mL/min (ref >60.00)
Glucose, Bld: 146 mg/dL — ABNORMAL HIGH (ref 70–99)
Potassium: 5 mEq/L (ref 3.5–5.1)
Sodium: 139 mEq/L (ref 135–145)

## 2017-03-23 LAB — LIPID PANEL
Cholesterol: 175 mg/dL (ref 0–200)
HDL: 37.4 mg/dL — ABNORMAL LOW (ref >39.00)
LDL Cholesterol: 119 mg/dL — ABNORMAL HIGH (ref 0–99)
NonHDL: 137.68
Total CHOL/HDL Ratio: 5
Triglycerides: 92 mg/dL (ref 0.0–149.0)
VLDL: 18.4 mg/dL (ref 0.0–40.0)

## 2017-03-23 LAB — HEPATIC FUNCTION PANEL
ALT: 30 U/L (ref 0–53)
AST: 21 U/L (ref 0–37)
Albumin: 4.3 g/dL (ref 3.5–5.2)
Alkaline Phosphatase: 71 U/L (ref 39–117)
Bilirubin, Direct: 0.2 mg/dL (ref 0.0–0.3)
Total Bilirubin: 1.2 mg/dL (ref 0.2–1.2)
Total Protein: 7.1 g/dL (ref 6.0–8.3)

## 2017-03-23 LAB — TSH: TSH: 2.22 u[IU]/mL (ref 0.35–4.50)

## 2017-03-23 MED ORDER — ZOSTER VAC RECOMB ADJUVANTED 50 MCG/0.5ML IM SUSR: 0.5000 mL | Freq: Once | INTRAMUSCULAR | 1 refills | Status: AC

## 2017-03-23 MED ORDER — ESOMEPRAZOLE MAGNESIUM 10 MG PO PACK: 10.0000 mg | PACK | Freq: Every day | ORAL | 3 refills | Status: DC | PRN

## 2017-03-23 NOTE — Assessment & Plan Note (Signed)
Biannual [place] - will schedule BP is nl at home ASA

## 2017-03-23 NOTE — Progress Notes (Signed)
Subjective:  Patient ID: Francis Sims., male    DOB: 1940-08-09  Age: 76 y.o. MRN: [id number]  CC: No chief complaint on file.   HPI Francis Sims. presents for well exam F/u GERD, carotid stenosis BP is 130/80 at home   Outpatient Medications Prior to Visit  Medication Sig Dispense Refill  . aspirin EC 81 MG tablet Take 81 mg by mouth daily.    . cholecalciferol (VITAMIN D) 1000 UNITS tablet Take 1,000 Units by mouth daily.      Francis Sims esomeprazole (NEXIUM) 10 MG packet Take 10 mg by mouth daily as needed. 30 each 5  . fluticasone (FLONASE) 50 MCG/ACT nasal spray Place 2 sprays into both nostrils daily. 16 g 6  . loratadine (CLARITIN) 10 MG tablet Take 1 tablet (10 mg total) by mouth daily. 100 tablet 3  . Omega-3 Fatty Acids (FISH OIL) 1000 MG CAPS Take 1 capsule by mouth 2 (two) times daily.      . vardenafil (LEVITRA) 20 MG tablet Take 1 tablet (20 mg total) by mouth daily as needed for erectile dysfunction. 90 tablet 1  . terazosin (HYTRIN) 2 MG capsule Take 1 capsule (2 mg total) by mouth at bedtime. (Patient not taking: Reported on 11/11/2016) 30 capsule 11   No facility-administered medications prior to visit.     ROS Review of Systems  Constitutional: Negative for appetite change, fatigue and unexpected weight change.  HENT: Negative for congestion, nosebleeds, sneezing, sore throat and trouble swallowing.   Eyes: Negative for itching and visual disturbance.  Respiratory: Negative for cough.   Cardiovascular: Negative for chest pain, palpitations and leg swelling.  Gastrointestinal: Negative for abdominal distention, blood in stool, diarrhea and nausea.  Genitourinary: Negative for frequency and hematuria.  Musculoskeletal: Negative for back pain, gait problem, joint swelling and neck pain.  Skin: Negative for rash.  Neurological: Negative for dizziness, tremors, speech difficulty and weakness.  Psychiatric/Behavioral: Negative for agitation, dysphoric mood and  sleep disturbance. The patient is not nervous/anxious.     Objective:  BP (!) 154/86 (BP Location: Left Arm, Patient Position: Sitting, Cuff Size: Large)   Pulse (!) 53   Temp 98 F (36.7 C) (Oral)   Ht 6\' 2"  (1.88 m)   Wt 214 lb (97.1 kg)   SpO2 99%   BMI 27.48 kg/m   BP Readings from Last 3 Encounters:  03/23/17 (!) 154/86  11/11/16 (!) 166/88  10/15/16 (!) 153/75    Wt Readings from Last 3 Encounters:  03/23/17 214 lb (97.1 kg)  11/11/16 215 lb (97.5 kg)  10/15/16 215 lb (97.5 kg)    Physical Exam  Constitutional: He is oriented to person, place, and time. He appears well-developed and well-nourished. No distress.  NAD  HENT:  Head: Normocephalic and atraumatic.  Right Ear: External ear normal.  Left Ear: External ear normal.  Nose: Nose normal.  Mouth/Throat: Oropharynx is clear and moist. No oropharyngeal exudate.  Eyes: Pupils are equal, round, and reactive to light. Conjunctivae and EOM are normal. Right eye exhibits no discharge. Left eye exhibits no discharge. No scleral icterus.  Neck: Normal range of motion. Neck supple. No JVD present. No tracheal deviation present. No thyromegaly present.  Cardiovascular: Normal rate, regular rhythm, normal heart sounds and intact distal pulses.  Exam reveals no gallop and no friction rub.   No murmur heard. Pulmonary/Chest: Effort normal and breath sounds normal. No stridor. No respiratory distress. He has no wheezes. He has no rales.  He exhibits no tenderness.  Abdominal: Soft. Bowel sounds are normal. He exhibits no distension and no mass. There is no tenderness. There is no rebound and no guarding.  Genitourinary: Rectum normal and penis normal. Rectal exam shows guaiac negative stool. No penile tenderness.  Musculoskeletal: Normal range of motion. He exhibits no edema or tenderness.  Lymphadenopathy:    He has no cervical adenopathy.  Neurological: He is alert and oriented to person, place, and time. He has normal  reflexes. No cranial nerve deficit. He exhibits normal muscle tone. He displays a negative Romberg sign. Coordination and gait normal.  Skin: Skin is warm and dry. No rash noted. He is not diaphoretic. No erythema. No pallor.  Psychiatric: He has a normal mood and affect. His behavior is normal. Judgment and thought content normal.  prostate 1+  Lab Results  Component Value Date   WBC 8.9 06/22/2016   HGB 16.5 06/22/2016   HCT 47.9 06/22/2016   PLT 232.0 06/22/2016   GLUCOSE 103 (H) 06/22/2016   CHOL 201 (H) 03/20/2016   TRIG 126.0 03/20/2016   HDL 34.70 (L) 03/20/2016   LDLDIRECT 157.3 05/08/2013   LDLCALC 141 (H) 03/20/2016   ALT 43 06/22/2016   AST 35 06/22/2016   NA 141 06/22/2016   K 4.5 06/22/2016   CL 103 06/22/2016   CREATININE 1.06 06/22/2016   BUN 17 06/22/2016   CO2 30 06/22/2016   TSH 1.73 03/20/2016   PSA 0.32 03/20/2016    US Abdomen Complete  Result Date: 08/28/2016 CLINICAL DATA:  Three-week history of abdominal pain EXAM: ABDOMEN ULTRASOUND COMPLETE COMPARISON:  None. FINDINGS: Gallbladder: No gallstones or wall thickening visualized. There is no pericholecystic fluid. No sonographic Murphy sign noted by sonographer. Common bile duct: Diameter: 4 mm. There is no intrahepatic, common hepatic, or common bile duct dilatation. Liver: No focal lesion identified. Liver echogenicity is somewhat inhomogeneous and mildly increased. IVC: No abnormality visualized. Pancreas: Visualized portion unremarkable. Portions of pancreas obscured by gas. Spleen: Size and appearance within normal limits. Right Kidney: Length: 12.3 cm. Echogenicity within normal limits. No mass or hydronephrosis visualized. Left Kidney: Length: 11.8 cm. Echogenicity within normal limits. No hydronephrosis visualized. There is a cyst in the medial left kidney measuring 1.7 x 1.4 x 1.4 cm. Abdominal aorta: No aneurysm visualized. There is atherosclerotic calcification and peripheral plaque in the aorta. Other  findings: No evident ascites. IMPRESSION: Inhomogeneous and increased liver echogenicity most likely is due to hepatic steatosis. While no focal liver lesions are evident, it must be cautioned that the sensitivity of ultrasound for detection of focal liver lesions is diminished in this circumstance. Portions of pancreas obscured by gas. Visualized portions of pancreas appear normal. Aortic atherosclerosis without aneurysm. Left renal cyst. Study otherwise unremarkable. Electronically Signed   By: Lowella Grip III M.D.   On: 08/28/2016 10:13    Assessment & Plan:   There are no diagnoses linked to this encounter. I have discontinued Mr. Schweizer's terazosin. I am also having him maintain his Fish Oil, cholecalciferol, aspirin EC, loratadine, fluticasone, esomeprazole, and vardenafil.  No orders of the defined types were placed in this encounter.    Follow-up: No Follow-up on file.  [name], MD

## 2017-03-23 NOTE — Assessment & Plan Note (Signed)
Chronic - nl BP at home

## 2017-03-23 NOTE — Patient Instructions (Signed)

## 2017-03-23 NOTE — Assessment & Plan Note (Signed)
Here for medicare wellness/physical  Diet: heart healthy  Physical activity: not sedentary  Depression/mood screen: negative  Hearing: intact to whispered voice  Visual acuity: grossly normal w/R contact lens in, performs annual eye exam  ADLs: capable - very active Fall risk: low to none  Home safety: good  Cognitive evaluation: intact to orientation, naming, recall and repetition  EOL planning: adv directives, full code/ I agree  I have personally reviewed and have noted  1. The patient's medical, surgical and social history  2. Their use of alcohol, tobacco or illicit drugs  3. Their current medications and supplements  4. The patient's functional ability including ADL's, fall risks, home safety risks and hearing or visual impairment.  5. Diet and physical activities  6. Evidence for depression or mood disorders 7. The roster of all physicians providing medical care to patient - is listed in the Snapshot section of the chart and reviewed today.    Today patient counseled on age appropriate routine health concerns for screening and prevention, each reviewed and up to date or declined. Immunizations reviewed and up to date or declined. Labs ordered and reviewed. Risk factors for depression reviewed and negative. Hearing function and visual acuity are intact. ADLs screened and addressed as needed. Functional ability and level of safety reviewed and appropriate. Education, counseling and referrals performed based on assessed risks today. Patient provided with a copy of personalized plan for preventive services.    Zostavax Rx Refused a flu shot

## 2017-03-29 ENCOUNTER — Telehealth: Payer: Self-pay | Admitting: *Deleted

## 2017-03-29 MED ORDER — OMEPRAZOLE 40 MG PO CPDR: 40.0000 mg | DELAYED_RELEASE_CAPSULE | Freq: Every day | ORAL | 3 refills | Status: DC

## 2017-03-29 NOTE — Telephone Encounter (Signed)
Rec'd call from pharmacist stating pt is requesting Esomeprazole 40 mg to be change to Omeprazole 40 mg. The esomeprazole is too expensive cost $300, and he can receive omeprazole with no copay. Ok to change inform will update pt chart w/omeprazole...[name]

## 2017-03-31 ENCOUNTER — Other Ambulatory Visit (INDEPENDENT_AMBULATORY_CARE_PROVIDER_SITE_OTHER): Payer: Medicare HMO

## 2017-03-31 DIAGNOSIS — Z0000 Encounter for general adult medical examination without abnormal findings: Secondary | ICD-10-CM | POA: Diagnosis not present

## 2017-03-31 DIAGNOSIS — H524 Presbyopia: Secondary | ICD-10-CM | POA: Diagnosis not present

## 2017-03-31 LAB — PSA: PSA: 0.43 ng/mL (ref 0.10–4.00)

## 2017-04-08 ENCOUNTER — Other Ambulatory Visit: Payer: Self-pay

## 2017-04-08 DIAGNOSIS — I6523 Occlusion and stenosis of bilateral carotid arteries: Secondary | ICD-10-CM

## 2017-04-29 ENCOUNTER — Ambulatory Visit (HOSPITAL_COMMUNITY)
Admission: RE | Admit: 2017-04-29 | Discharge: 2017-04-29 | Disposition: A | Discharge status: Home / Self Care | Payer: Medicare HMO | Source: Ambulatory Visit | Attending: Cardiovascular Disease | Admitting: Cardiovascular Disease

## 2017-04-29 DIAGNOSIS — E785 Hyperlipidemia, unspecified: Secondary | ICD-10-CM | POA: Insufficient documentation

## 2017-04-29 DIAGNOSIS — I6523 Occlusion and stenosis of bilateral carotid arteries: Secondary | ICD-10-CM | POA: Diagnosis not present

## 2017-04-29 DIAGNOSIS — Z87891 Personal history of nicotine dependence: Secondary | ICD-10-CM | POA: Diagnosis not present

## 2017-05-09 ENCOUNTER — Encounter: Payer: Self-pay | Admitting: Internal Medicine

## 2017-07-19 ENCOUNTER — Ambulatory Visit: Payer: Self-pay | Admitting: *Deleted

## 2017-07-19 NOTE — Telephone Encounter (Signed)
Pt  Has  A  History    Of   Diverticulitis   In  Past     Pt  Reports     Pain   l and  r  Upper   abd       X  3-4  Days       No  Vomiting      BM    Seem  To  Help  Food  Seems  To aggrevate  Pt   Reports     This does not feel like the  diverticultis  He  Has  Had  In past  . Appointment  Made  With  Dr  Otis Brace  For tommorow  At  1100     By  Pam  At the  Practice  Pt  Advised  Clear liquids  Tonight   Go to  [facility]  If  Pain  Becomes  Worse  Or  increase  In  Symptoms       Reason for Disposition . [1] MILD pain (e.g., does not interfere with normal activities) AND [2] comes and goes (cramps) AND [3] present > 72 hours  Answer Assessment - Initial Assessment Questions 1. LOCATION: "Where does it hurt?"       Upper  abd   On  Both   Sides      2. RADIATION: "Does the pain shoot anywhere else?" (e.g., chest, back)      None   3. ONSET: "When did the pain begin?" (e.g., minutes, hours or days ago)        3  Days   Ago   4. SUDDEN: "Gradual or sudden onset?"        Sudden 5. PATTERN "Does the pain come and go, or is it constant?"    - If constant: "Is it getting better, staying the same, or worsening?"      (Note: Constant means the pain never goes away completely; most serious pain is constant and it progresses)     - If intermittent: "How long does it last?" "Do you have pain now?"     (Note: Intermittent means the pain goes away completely between bouts)      Constant  releived  By a  Bm   6. SEVERITY: "How bad is the pain?"  (e.g., Scale 1-10; mild, moderate, or severe)    - MILD (1-3): doesn't interfere with normal activities, abdomen soft and not tender to touch     - MODERATE (4-7): interferes with normal activities or awakens from sleep, tender to touch     - SEVERE (8-10): excruciating pain, doubled over, unable to do any normal activities          mILD 7. RECURRENT SYMPTOM: "Have you ever had this type of abdominal pain before?" If so, ask: "When was the last time?" and "What  happened that time?"      No 8. AGGRAVATING FACTORS: "Does anything seem to cause this pain?" (e.g., foods, stress, alcohol)       Food   9. CARDIAC SYMPTOMS: "Do you have any of the following symptoms: chest pain, difficulty breathing, sweating, nausea?"        No 10. OTHER SYMPTOMS: "Do you have any other symptoms?" (e.g., fever, vomiting, diarrhea)        BM   Soft  But not  Runny     11. PREGNANCY: "Is there any chance you are pregnant?" "When was your last menstrual period?"  N/a  Protocols used: ABDOMINAL PAIN - UPPER-A-AH

## 2017-07-20 ENCOUNTER — Ambulatory Visit (INDEPENDENT_AMBULATORY_CARE_PROVIDER_SITE_OTHER)
Admission: RE | Admit: 2017-07-20 | Discharge: 2017-07-20 | Disposition: A | Discharge status: Home / Self Care | Payer: Medicare HMO | Source: Ambulatory Visit | Attending: Internal Medicine | Admitting: Internal Medicine

## 2017-07-20 ENCOUNTER — Encounter: Payer: Self-pay | Admitting: Internal Medicine

## 2017-07-20 ENCOUNTER — Other Ambulatory Visit (INDEPENDENT_AMBULATORY_CARE_PROVIDER_SITE_OTHER): Payer: Medicare HMO

## 2017-07-20 ENCOUNTER — Ambulatory Visit (INDEPENDENT_AMBULATORY_CARE_PROVIDER_SITE_OTHER): Payer: Medicare HMO | Admitting: Internal Medicine

## 2017-07-20 DIAGNOSIS — R109 Unspecified abdominal pain: Secondary | ICD-10-CM | POA: Diagnosis not present

## 2017-07-20 DIAGNOSIS — N320 Bladder-neck obstruction: Secondary | ICD-10-CM

## 2017-07-20 DIAGNOSIS — R140 Abdominal distension (gaseous): Secondary | ICD-10-CM

## 2017-07-20 DIAGNOSIS — R142 Eructation: Secondary | ICD-10-CM | POA: Diagnosis not present

## 2017-07-20 DIAGNOSIS — R1031 Right lower quadrant pain: Secondary | ICD-10-CM

## 2017-07-20 LAB — BASIC METABOLIC PANEL
BUN: 15 mg/dL (ref 6–23)
CO2: 33 mEq/L — ABNORMAL HIGH (ref 19–32)
Calcium: 9.4 mg/dL (ref 8.4–10.5)
Chloride: 100 mEq/L (ref 96–112)
Creatinine, Ser: 1.06 mg/dL (ref 0.40–1.50)
GFR: 72.48 mL/min (ref >60.00)
Glucose, Bld: 145 mg/dL — ABNORMAL HIGH (ref 70–99)
Potassium: 4.6 mEq/L (ref 3.5–5.1)
Sodium: 139 mEq/L (ref 135–145)

## 2017-07-20 LAB — CBC WITH DIFFERENTIAL/PLATELET
Basophils Absolute: 0.1 10*3/uL (ref 0.0–0.1)
Basophils Relative: 1.2 % (ref 0.0–3.0)
Eosinophils Absolute: 0.1 10*3/uL (ref 0.0–0.7)
Eosinophils Relative: 0.9 % (ref 0.0–5.0)
HCT: 50.5 % (ref 39.0–52.0)
Hemoglobin: 17.3 g/dL — ABNORMAL HIGH (ref 13.0–17.0)
Lymphocytes Relative: 25.9 % (ref 12.0–46.0)
Lymphs Abs: 2.2 10*3/uL (ref 0.7–4.0)
MCHC: 34.4 g/dL (ref 30.0–36.0)
MCV: 84.5 fl (ref 78.0–100.0)
Monocytes Absolute: 0.7 10*3/uL (ref 0.1–1.0)
Monocytes Relative: 8.7 % (ref 3.0–12.0)
Neutro Abs: 5.4 10*3/uL (ref 1.4–7.7)
Neutrophils Relative %: 63.3 % (ref 43.0–77.0)
Platelets: 219 10*3/uL (ref 150.0–400.0)
RBC: 5.98 Mil/uL — ABNORMAL HIGH (ref 4.22–5.81)
RDW: 14 % (ref 11.5–15.5)
WBC: 8.5 10*3/uL (ref 4.0–10.5)

## 2017-07-20 LAB — HEPATIC FUNCTION PANEL
ALT: 33 U/L (ref 0–53)
AST: 27 U/L (ref 0–37)
Albumin: 4.4 g/dL (ref 3.5–5.2)
Alkaline Phosphatase: 79 U/L (ref 39–117)
Bilirubin, Direct: 0.3 mg/dL (ref 0.0–0.3)
Total Bilirubin: 1.7 mg/dL — ABNORMAL HIGH (ref 0.2–1.2)
Total Protein: 7.5 g/dL (ref 6.0–8.3)

## 2017-07-20 LAB — URINALYSIS
Bilirubin Urine: NEGATIVE
Hgb urine dipstick: NEGATIVE
Ketones, ur: NEGATIVE
Leukocytes, UA: NEGATIVE
Nitrite: NEGATIVE
Specific Gravity, Urine: 1.015 (ref 1.000–1.030)
Total Protein, Urine: NEGATIVE
Urine Glucose: NEGATIVE
Urobilinogen, UA: 0.2 (ref 0.0–1.0)
pH: 6.5 (ref 5.0–8.0)

## 2017-07-20 LAB — SEDIMENTATION RATE: Sed Rate: 9 mm/hr (ref 0–20)

## 2017-07-20 LAB — LIPASE: Lipase: 13 U/L (ref 11.0–59.0)

## 2017-07-20 MED ORDER — FINASTERIDE 5 MG PO TABS: 5.0000 mg | ORAL_TABLET | Freq: Every day | ORAL | 3 refills | Status: DC

## 2017-07-20 MED ORDER — IOPAMIDOL (ISOVUE-300) INJECTION 61%
100.0000 mL | Freq: Once | INTRAVENOUS | Status: AC | PRN
  Administered 2017-07-20: 100 mL via INTRAVENOUS

## 2017-07-20 NOTE — Progress Notes (Signed)
Subjective:  Patient ID: Francis Ade., male    DOB: 12/06/1940  Age: 77 y.o. MRN: [id number]  CC: No chief complaint on file.   HPI Francis Sims. presents for abd pain since [place] last week. He had it off and on before. No n/v. He was constipated x 2 d; had a BM it helped...cancer/o bloating; pain was severe for a few days.Marland KitchenMarland KitchenC/o burping  Outpatient Medications Prior to Visit  Medication Sig Dispense Refill  . aspirin EC 81 MG tablet Take 81 mg by mouth daily.    . cholecalciferol (VITAMIN D) 1000 UNITS tablet Take 1,000 Units by mouth daily.      . fluticasone (FLONASE) 50 MCG/ACT nasal spray Place 2 sprays into both nostrils daily. 16 g 6  . loratadine (CLARITIN) 10 MG tablet Take 1 tablet (10 mg total) by mouth daily. 100 tablet 3  . Omega-3 Fatty Acids (FISH OIL) 1000 MG CAPS Take 1 capsule by mouth 2 (two) times daily.      Marland Kitchen omeprazole (PRILOSEC) 40 MG capsule Take 1 capsule (40 mg total) by mouth daily. 90 capsule 3  . vardenafil (LEVITRA) 20 MG tablet Take 1 tablet (20 mg total) by mouth daily as needed for erectile dysfunction. 90 tablet 1   No facility-administered medications prior to visit.     ROS Review of Systems  Constitutional: Negative for appetite change, fatigue and unexpected weight change.  HENT: Negative for congestion, nosebleeds, sneezing, sore throat and trouble swallowing.   Eyes: Negative for itching and visual disturbance.  Respiratory: Negative for cough.   Cardiovascular: Negative for chest pain, palpitations and leg swelling.  Gastrointestinal: Positive for abdominal distention, abdominal pain and constipation. Negative for blood in stool, diarrhea and nausea.  Genitourinary: Negative for frequency and hematuria.  Musculoskeletal: Negative for back pain, gait problem, joint swelling and neck pain.  Skin: Negative for rash.  Neurological: Negative for dizziness, tremors, speech difficulty and weakness.  Psychiatric/Behavioral: Negative for  agitation, dysphoric mood and sleep disturbance. The patient is not nervous/anxious.     Objective:  BP (!) 144/82 (BP Location: Left Arm, Patient Position: Sitting, Cuff Size: Large)   Pulse (!) 59   Temp 98.4 F (36.9 C) (Oral)   Ht 6\' 2"  (1.88 m)   Wt 214 lb (97.1 kg)   SpO2 98%   BMI 27.48 kg/m   BP Readings from Last 3 Encounters:  07/20/17 (!) 144/82  03/23/17 (!) 154/86  11/11/16 (!) 166/88    Wt Readings from Last 3 Encounters:  07/20/17 214 lb (97.1 kg)  03/23/17 214 lb (97.1 kg)  11/11/16 215 lb (97.5 kg)    Physical Exam  Constitutional: He is oriented to person, place, and time. He appears well-developed. No distress.  NAD  HENT:  Mouth/Throat: Oropharynx is clear and moist.  Eyes: Conjunctivae are normal. Pupils are equal, round, and reactive to light.  Neck: Normal range of motion. No JVD present. No thyromegaly present.  Cardiovascular: Normal rate, regular rhythm, normal heart sounds and intact distal pulses. Exam reveals no gallop and no friction rub.  No murmur heard. Pulmonary/Chest: Effort normal and breath sounds normal. No respiratory distress. He has no wheezes. He has no rales. He exhibits no tenderness.  Abdominal: Soft. Bowel sounds are normal. He exhibits no distension and no mass. There is tenderness. There is no rebound and no guarding.  Genitourinary: Rectum normal. Rectal exam shows guaiac negative stool.  Musculoskeletal: Normal range of motion. He exhibits no  edema or tenderness.  Lymphadenopathy:    He has no cervical adenopathy.  Neurological: He is alert and oriented to person, place, and time. He has normal reflexes. No cranial nerve deficit. He exhibits normal muscle tone. He displays a negative Romberg sign. Coordination and gait normal.  Skin: Skin is warm and dry. No rash noted.  Psychiatric: He has a normal mood and affect. His behavior is normal. Judgment and thought content normal.   abd is a little distended, tender in the  RLQ>LLQ prostate1-2+  Lab Results  Component Value Date   WBC 8.3 03/23/2017   HGB 16.7 03/23/2017   HCT 50.3 03/23/2017   PLT 216.0 03/23/2017   GLUCOSE 146 (H) 03/23/2017   CHOL 175 03/23/2017   TRIG 92.0 03/23/2017   HDL 37.40 (L) 03/23/2017   LDLDIRECT 157.3 05/08/2013   LDLCALC 119 (H) 03/23/2017   ALT 30 03/23/2017   AST 21 03/23/2017   NA 139 03/23/2017   K 5.0 03/23/2017   CL 101 03/23/2017   CREATININE 0.97 03/23/2017   BUN 16 03/23/2017   CO2 30 03/23/2017   TSH 2.22 03/23/2017   PSA 0.43 03/31/2017    Vas US Carotid  Result Date: 04/29/2017 Carotid Arterial Duplex Study Indications:      Patient denies cerebrovascular symptoms. Risk Factors:     Hyperlipidemia, past history of smoking. Comparison Study: Previous Carotid duplex in 09/2014 showed RICA velocity 174/29                   cm/sec, LICA velocity was normal. Examination Guidelines: A complete evaluation includes B-mode imaging, spectral doppler, color doppler, and power doppler as needed of all accessible portions of each vessel. Bilateral testing is considered an integral part of a complete examination. Limited examinations for reoccurring indications may be performed as noted. Right Carotid Findings: +----------+--------+--------+--------+--------------------+-------------------+           PSV cm/sEDV cm/sStenosisDescribe            Comments            +----------+--------+--------+--------+--------------------+-------------------+ CCA Prox  131     0                                                       +----------+--------+--------+--------+--------------------+-------------------+ CCA Distal131     16                                                      +----------+--------+--------+--------+--------------------+-------------------+ ICA Prox  -187    -22     40-59%  heterogenous and    based on systolic                                     irregular           velocity and  turbulence          +----------+--------+--------+--------+--------------------+-------------------+ ICA Mid   -100    -20                                                     +----------+--------+--------+--------+--------------------+-------------------+ ICA Distal-114    -27                                                     +----------+--------+--------+--------+--------------------+-------------------+ ECA       319     6       >50%                                            +----------+--------+--------+--------+--------------------+-------------------+ +----------+--------+-------+----------------+-------------------+           PSV cm/sEDV cmsDescribe        Arm Pressure (mmHG) +----------+--------+-------+----------------+-------------------+ [id number]     0      Multiphasic, [id number]                 +----------+--------+-------+----------------+-------------------+ +---------+--------+--+--------+-+---------+ VertebralPSV cm/s41EDV cm/s8Antegrade +---------+--------+--+--------+-+---------+  Left Carotid Findings: +----------+--------+--------+--------+----------+--------+           PSV cm/sEDV cm/sStenosisDescribe  Comments +----------+--------+--------+--------+----------+--------+ CCA Prox  155     16                                 +----------+--------+--------+--------+----------+--------+ CCA Distal-104    -18                                +----------+--------+--------+--------+----------+--------+ ICA Prox  -113    -26     1-39%   hypoechoic         +----------+--------+--------+--------+----------+--------+ ICA Mid   -88     -22                                +----------+--------+--------+--------+----------+--------+ ICA Distal-94     -19                                +----------+--------+--------+--------+----------+--------+ ECA       -169    -7                                  +----------+--------+--------+--------+----------+--------+ +----------+--------+--------+----------------+-------------------+ SubclavianPSV cm/sEDV cm/sDescribe        Arm Pressure (mmHG) +----------+--------+--------+----------------+-------------------+           99      0       Multiphasic, [id number]                 +----------+--------+--------+----------------+-------------------+ +---------+--------+--+--------+--+---------+ VertebralPSV cm/s58EDV cm/s14Antegrade +---------+--------+--+--------+--+---------+  Final Interpretation:   Assessment & Plan:   There are no diagnoses linked to this encounter. I am having Francis Sims. Francis Sims. "Francis Sims" maintain his Fish  Oil, cholecalciferol, aspirin EC, loratadine, fluticasone, vardenafil, and omeprazole.  No orders of the defined types were placed in this encounter.    Follow-up: No Follow-up on file.  [name], MD

## 2017-07-20 NOTE — Assessment & Plan Note (Signed)
Worse Finasteride po - start in 1 wk

## 2017-07-20 NOTE — Assessment & Plan Note (Signed)
RLQ>>LLQ acute, r/o appendicitis CT abd STAT Labs STAT NPO

## 2017-07-20 NOTE — Assessment & Plan Note (Signed)
CT abd Labs 

## 2017-07-20 NOTE — Patient Instructions (Signed)
Go to ER if worse 

## 2017-10-29 ENCOUNTER — Ambulatory Visit: Payer: Self-pay

## 2017-10-29 NOTE — Telephone Encounter (Signed)
Pt fell 2 weeks ago and ever since he has intermittent hip pain the shoots across the lower back. Also having pain down right leg and intermittent leg pain to the left leg. Pt states pain is 8/10 and has been taking Naproxen for it.  No openings with PCP. Appt made for Monday with Jodi Mourning NP. Care advice given per protocol. Reason for Disposition . [1] MODERATE pain (e.g., interferes with normal activities, limping) AND [2] present > 3 days  Answer Assessment - Initial Assessment Questions 1. LOCATION and RADIATION: "Where is the pain located?"      Both hips 2. QUALITY: "What does the pain feel like?"  (e.g., sharp, dull, aching, burning)  ache 3. SEVERITY: "How bad is the pain?" "What does it keep you from doing?"   (Scale 1-10; or mild, moderate, severe)   -  MILD (1-3): doesn't interfere with normal activities    -  MODERATE (4-7): interferes with normal activities (e.g., work or school) or awakens from sleep, limping    -  SEVERE (8-10): excruciating pain, unable to do any normal activities, unable to walk     Walk 8/10 no pain with sitting  4. ONSET: "When did the pain start?" "Does it come and go, or is it there all the time?"     2 weeks ago comes and goes  5. WORK OR EXERCISE: "Has there been any recent work or exercise that involved this part of the body?"      Fell playing tennis 6. CAUSE: "What do you think is causing the hip pain?"      The fall 7. AGGRAVATING FACTORS: "What makes the hip pain worse?" (e.g., walking, climbing stairs, running)     Walking intermittently,climbing stairs 8. OTHER SYMPTOMS: "Do you have any other symptoms?" (e.g., back pain, pain shooting down leg,  fever, rash)     Pain down intermittent right leg, and left leg intermittent especially when he first gets up  Protocols used: HIP PAIN-A-AH

## 2017-11-01 ENCOUNTER — Ambulatory Visit (INDEPENDENT_AMBULATORY_CARE_PROVIDER_SITE_OTHER): Payer: Medicare HMO | Admitting: Family

## 2017-11-01 ENCOUNTER — Encounter: Payer: Self-pay | Admitting: Family

## 2017-11-01 ENCOUNTER — Other Ambulatory Visit: Payer: Self-pay | Admitting: Family

## 2017-11-01 ENCOUNTER — Ambulatory Visit (INDEPENDENT_AMBULATORY_CARE_PROVIDER_SITE_OTHER)
Admission: RE | Admit: 2017-11-01 | Discharge: 2017-11-01 | Disposition: A | Discharge status: Home / Self Care | Payer: Medicare HMO | Source: Ambulatory Visit | Attending: Family | Admitting: Family

## 2017-11-01 VITALS — BP 160/74 | HR 69 | Temp 98.3°F | Ht 74.0 in | Wt 215.0 lb

## 2017-11-01 DIAGNOSIS — S79912A Unspecified injury of left hip, initial encounter: Secondary | ICD-10-CM | POA: Diagnosis not present

## 2017-11-01 DIAGNOSIS — M25551 Pain in right hip: Secondary | ICD-10-CM

## 2017-11-01 DIAGNOSIS — S79911A Unspecified injury of right hip, initial encounter: Secondary | ICD-10-CM | POA: Diagnosis not present

## 2017-11-01 DIAGNOSIS — M545 Low back pain: Secondary | ICD-10-CM | POA: Diagnosis not present

## 2017-11-01 DIAGNOSIS — M25552 Pain in left hip: Secondary | ICD-10-CM

## 2017-11-01 DIAGNOSIS — S3992XA Unspecified injury of lower back, initial encounter: Secondary | ICD-10-CM | POA: Diagnosis not present

## 2017-11-01 MED ORDER — METHYLPREDNISOLONE 4 MG PO TBPK: ORAL_TABLET | ORAL | 0 refills | Status: DC
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
## 2017-11-03 DIAGNOSIS — D485 Neoplasm of uncertain behavior of skin: Secondary | ICD-10-CM | POA: Diagnosis not present

## 2017-11-03 DIAGNOSIS — D044 Carcinoma in situ of skin of scalp and neck: Secondary | ICD-10-CM | POA: Diagnosis not present

## 2017-11-03 DIAGNOSIS — B078 Other viral warts: Secondary | ICD-10-CM | POA: Diagnosis not present

## 2017-11-03 DIAGNOSIS — L570 Actinic keratosis: Secondary | ICD-10-CM | POA: Diagnosis not present

## 2017-11-03 DIAGNOSIS — Z85828 Personal history of other malignant neoplasm of skin: Secondary | ICD-10-CM | POA: Diagnosis not present

## 2017-12-01 DIAGNOSIS — C4442 Squamous cell carcinoma of skin of scalp and neck: Secondary | ICD-10-CM | POA: Diagnosis not present

## 2017-12-01 DIAGNOSIS — Z85828 Personal history of other malignant neoplasm of skin: Secondary | ICD-10-CM | POA: Diagnosis not present

## 2017-12-22 DIAGNOSIS — R69 Illness, unspecified: Secondary | ICD-10-CM | POA: Diagnosis not present

## 2018-01-07 ENCOUNTER — Telehealth: Payer: Self-pay

## 2018-01-07 ENCOUNTER — Other Ambulatory Visit: Payer: Self-pay | Admitting: Family

## 2018-01-07 DIAGNOSIS — M549 Dorsalgia, unspecified: Secondary | ICD-10-CM

## 2018-01-07 DIAGNOSIS — M25559 Pain in unspecified hip: Secondary | ICD-10-CM

## 2018-01-07 NOTE — Telephone Encounter (Signed)
Spoke with patient and info given 

## 2018-01-07 NOTE — Telephone Encounter (Signed)
Copied from [place] [id number]. Topic: Referral - Request >> Jan 07, 2018 11:15 AM Hewitt Shorts wrote: Pt is calling to get a written referral to [facility] [phone] for his hip and leg pain that he saw Jodi Mourning back in 11/01/17 a trainer at the gym recommended that  He call and schedule with [facility] which he does have on 01/24/18 they just need a referral for insurance   Best number [phone]

## 2018-01-07 NOTE — Telephone Encounter (Signed)
I put in the referral as requested; if he has further concerns/ questions about the referral, he needs to contact our referral coordinators.

## 2018-01-24 DIAGNOSIS — M545 Low back pain: Secondary | ICD-10-CM | POA: Diagnosis not present

## 2018-01-24 DIAGNOSIS — M25552 Pain in left hip: Secondary | ICD-10-CM | POA: Diagnosis not present

## 2018-02-02 DIAGNOSIS — M25552 Pain in left hip: Secondary | ICD-10-CM | POA: Diagnosis not present

## 2018-02-02 DIAGNOSIS — M545 Low back pain: Secondary | ICD-10-CM | POA: Diagnosis not present

## 2018-02-09 DIAGNOSIS — M545 Low back pain: Secondary | ICD-10-CM | POA: Diagnosis not present

## 2018-02-09 DIAGNOSIS — M25552 Pain in left hip: Secondary | ICD-10-CM | POA: Diagnosis not present

## 2018-02-16 DIAGNOSIS — M545 Low back pain: Secondary | ICD-10-CM | POA: Diagnosis not present

## 2018-02-16 DIAGNOSIS — M25552 Pain in left hip: Secondary | ICD-10-CM | POA: Diagnosis not present

## 2018-02-23 DIAGNOSIS — M25552 Pain in left hip: Secondary | ICD-10-CM | POA: Diagnosis not present

## 2018-02-23 DIAGNOSIS — M545 Low back pain: Secondary | ICD-10-CM | POA: Diagnosis not present

## 2018-03-02 DIAGNOSIS — M25552 Pain in left hip: Secondary | ICD-10-CM | POA: Diagnosis not present

## 2018-03-02 DIAGNOSIS — M545 Low back pain: Secondary | ICD-10-CM | POA: Diagnosis not present

## 2018-03-12 DIAGNOSIS — R69 Illness, unspecified: Secondary | ICD-10-CM | POA: Diagnosis not present

## 2018-03-16 DIAGNOSIS — H524 Presbyopia: Secondary | ICD-10-CM | POA: Diagnosis not present

## 2018-04-18 ENCOUNTER — Other Ambulatory Visit: Payer: Self-pay | Admitting: Internal Medicine

## 2018-05-05 DIAGNOSIS — L738 Other specified follicular disorders: Secondary | ICD-10-CM | POA: Diagnosis not present

## 2018-05-05 DIAGNOSIS — D485 Neoplasm of uncertain behavior of skin: Secondary | ICD-10-CM | POA: Diagnosis not present

## 2018-05-05 DIAGNOSIS — L570 Actinic keratosis: Secondary | ICD-10-CM | POA: Diagnosis not present

## 2018-05-05 DIAGNOSIS — Z85828 Personal history of other malignant neoplasm of skin: Secondary | ICD-10-CM | POA: Diagnosis not present

## 2018-05-17 ENCOUNTER — Encounter: Payer: Self-pay | Admitting: Internal Medicine

## 2018-05-17 ENCOUNTER — Ambulatory Visit (INDEPENDENT_AMBULATORY_CARE_PROVIDER_SITE_OTHER): Payer: Medicare HMO | Admitting: Internal Medicine

## 2018-05-17 ENCOUNTER — Other Ambulatory Visit (INDEPENDENT_AMBULATORY_CARE_PROVIDER_SITE_OTHER): Payer: Medicare HMO

## 2018-05-17 VITALS — BP 154/80 | HR 63 | Temp 98.1°F | Ht 74.0 in | Wt 216.0 lb

## 2018-05-17 DIAGNOSIS — Z0000 Encounter for general adult medical examination without abnormal findings: Secondary | ICD-10-CM | POA: Diagnosis not present

## 2018-05-17 DIAGNOSIS — Z0001 Encounter for general adult medical examination with abnormal findings: Secondary | ICD-10-CM | POA: Diagnosis not present

## 2018-05-17 DIAGNOSIS — L299 Pruritus, unspecified: Secondary | ICD-10-CM

## 2018-05-17 DIAGNOSIS — E785 Hyperlipidemia, unspecified: Secondary | ICD-10-CM

## 2018-05-17 DIAGNOSIS — R609 Edema, unspecified: Secondary | ICD-10-CM | POA: Diagnosis not present

## 2018-05-17 DIAGNOSIS — N320 Bladder-neck obstruction: Secondary | ICD-10-CM

## 2018-05-17 LAB — URINALYSIS
Bilirubin Urine: NEGATIVE
Hgb urine dipstick: NEGATIVE
Ketones, ur: NEGATIVE
Leukocytes, UA: NEGATIVE
Nitrite: NEGATIVE
Specific Gravity, Urine: 1.02 (ref 1.000–1.030)
Total Protein, Urine: NEGATIVE
Urine Glucose: NEGATIVE
Urobilinogen, UA: 0.2 (ref 0.0–1.0)
pH: 6 (ref 5.0–8.0)

## 2018-05-17 LAB — CBC WITH DIFFERENTIAL/PLATELET
Basophils Absolute: 0 10*3/uL (ref 0.0–0.1)
Basophils Relative: 0.4 % (ref 0.0–3.0)
Eosinophils Absolute: 0.1 10*3/uL (ref 0.0–0.7)
Eosinophils Relative: 1 % (ref 0.0–5.0)
HCT: 47.2 % (ref 39.0–52.0)
Hemoglobin: 16.1 g/dL (ref 13.0–17.0)
Lymphocytes Relative: 18.4 % (ref 12.0–46.0)
Lymphs Abs: 1.8 10*3/uL (ref 0.7–4.0)
MCHC: 34.2 g/dL (ref 30.0–36.0)
MCV: 83.7 fl (ref 78.0–100.0)
Monocytes Absolute: 0.9 10*3/uL (ref 0.1–1.0)
Monocytes Relative: 8.8 % (ref 3.0–12.0)
Neutro Abs: 6.9 10*3/uL (ref 1.4–7.7)
Neutrophils Relative %: 71.4 % (ref 43.0–77.0)
Platelets: 209 10*3/uL (ref 150.0–400.0)
RBC: 5.63 Mil/uL (ref 4.22–5.81)
RDW: 13.8 % (ref 11.5–15.5)
WBC: 9.7 10*3/uL (ref 4.0–10.5)

## 2018-05-17 LAB — HEPATIC FUNCTION PANEL
ALT: 40 U/L (ref 0–53)
AST: 28 U/L (ref 0–37)
Albumin: 4.4 g/dL (ref 3.5–5.2)
Alkaline Phosphatase: 91 U/L (ref 39–117)
Bilirubin, Direct: 0.2 mg/dL (ref 0.0–0.3)
Total Bilirubin: 1.5 mg/dL — ABNORMAL HIGH (ref 0.2–1.2)
Total Protein: 7.2 g/dL (ref 6.0–8.3)

## 2018-05-17 LAB — BASIC METABOLIC PANEL
BUN: 16 mg/dL (ref 6–23)
CO2: 25 mEq/L (ref 19–32)
Calcium: 9.1 mg/dL (ref 8.4–10.5)
Chloride: 102 mEq/L (ref 96–112)
Creatinine, Ser: 0.97 mg/dL (ref 0.40–1.50)
GFR: 80.11 mL/min (ref >60.00)
Glucose, Bld: 167 mg/dL — ABNORMAL HIGH (ref 70–99)
Potassium: 4.3 mEq/L (ref 3.5–5.1)
Sodium: 137 mEq/L (ref 135–145)

## 2018-05-17 LAB — LIPID PANEL
Cholesterol: 193 mg/dL (ref 0–200)
HDL: 31.8 mg/dL — ABNORMAL LOW (ref >39.00)
LDL Cholesterol: 135 mg/dL — ABNORMAL HIGH (ref 0–99)
NonHDL: 160.89
Total CHOL/HDL Ratio: 6
Triglycerides: 130 mg/dL (ref 0.0–149.0)
VLDL: 26 mg/dL (ref 0.0–40.0)

## 2018-05-17 LAB — TSH: TSH: 1.84 u[IU]/mL (ref 0.35–4.50)

## 2018-05-17 LAB — PSA: PSA: 0.3 ng/mL (ref 0.10–4.00)

## 2018-05-17 MED ORDER — ZOSTER VAC RECOMB ADJUVANTED 50 MCG/0.5ML IM SUSR: 0.5000 mL | Freq: Once | INTRAMUSCULAR | 1 refills | Status: AC

## 2018-05-17 MED ORDER — TAMSULOSIN HCL 0.4 MG PO CAPS: 0.4000 mg | ORAL_CAPSULE | Freq: Every day | ORAL | 11 refills | Status: DC

## 2018-05-17 NOTE — Assessment & Plan Note (Signed)
Venous insufficiency Compression knee highs

## 2018-05-17 NOTE — Assessment & Plan Note (Signed)
Worse PSA Added Flomax

## 2018-05-17 NOTE — Assessment & Plan Note (Addendum)
Use claritin Cooler short showers

## 2018-05-17 NOTE — Assessment & Plan Note (Signed)
    Here for medicare wellness/physical  Diet: heart healthy  Physical activity: not sedentary - tennis Depression/mood screen: negative  Hearing: intact to whispered voice  Visual acuity: grossly normal w/R contact lens in, performs annual eye exam  ADLs: capable - very active Fall risk: low to none  Home safety: good  Cognitive evaluation: intact to orientation, naming, recall and repetition  EOL planning: adv directives, full code/ I agree  I have personally reviewed and have noted  1. The patient's medical, surgical and social history  2. Their use of alcohol, tobacco or illicit drugs  3. Their current medications and supplements  4. The patient's functional ability including ADL's, fall risks, home safety risks and hearing or visual impairment.  5. Diet and physical activities  6. Evidence for depression or mood disorders 7. The roster of all physicians providing medical care to patient - is listed in the Snapshot section of the chart and reviewed today.    Today patient counseled on age appropriate routine health concerns for screening and prevention, each reviewed and up to date or declined. Immunizations reviewed and up to date or declined. Labs ordered and reviewed. Risk factors for depression reviewed and negative. Hearing function and visual acuity are intact. ADLs screened and addressed as needed. Functional ability and level of safety reviewed and appropriate. Education, counseling and referrals performed based on assessed risks today. Patient provided with a copy of personalized plan for preventive services.   Zostavax Rx Refused a flu shot Last colon 2016

## 2018-05-17 NOTE — Progress Notes (Signed)
Subjective:  Patient ID: Francis Ade., male    DOB: 13-Aug-1940  Age: 77 y.o. MRN: [id number]  CC: No chief complaint on file.   HPI Francis Sims. presents for a well exam C/o urinary frequency, urgency C/o itching all the time relieved w/Claritin BP nl at home   Outpatient Medications Prior to Visit  Medication Sig Dispense Refill  . aspirin EC 81 MG tablet Take 81 mg by mouth daily.    . cholecalciferol (VITAMIN D) 1000 UNITS tablet Take 1,000 Units by mouth daily.      . finasteride (PROSCAR) 5 MG tablet Take 1 tablet (5 mg total) by mouth daily. 90 tablet 3  . fluticasone (FLONASE) 50 MCG/ACT nasal spray Place 2 sprays into both nostrils daily. 16 g 6  . loratadine (CLARITIN) 10 MG tablet Take 1 tablet (10 mg total) by mouth daily. 100 tablet 3  . methylPREDNISolone (MEDROL DOSEPAK) 4 MG TBPK tablet Taper as directed 21 tablet 0  . Omega-3 Fatty Acids (FISH OIL) 1000 MG CAPS Take 1 capsule by mouth 2 (two) times daily.      Marland Kitchen omeprazole (PRILOSEC) 40 MG capsule TAKE ONE CAPSULE BY MOUTH EVERY DAY AS NEEDED 90 capsule 3  . vardenafil (LEVITRA) 20 MG tablet Take 1 tablet (20 mg total) by mouth daily as needed for erectile dysfunction. 90 tablet 1   No facility-administered medications prior to visit.     ROS: Review of Systems  Constitutional: Negative for appetite change, fatigue and unexpected weight change.  HENT: Negative for congestion, nosebleeds, sneezing, sore throat and trouble swallowing.   Eyes: Negative for itching and visual disturbance.  Respiratory: Negative for cough.   Cardiovascular: Negative for chest pain, palpitations and leg swelling.  Gastrointestinal: Negative for abdominal distention, blood in stool, diarrhea and nausea.  Genitourinary: Positive for frequency and urgency. Negative for hematuria.  Musculoskeletal: Positive for arthralgias. Negative for back pain, gait problem, joint swelling and neck pain.  Skin: Negative for rash.    Neurological: Negative for dizziness, tremors, speech difficulty and weakness.  Psychiatric/Behavioral: Negative for agitation, dysphoric mood, sleep disturbance and suicidal ideas. The patient is not nervous/anxious.     Objective:  BP (!) 154/80 (BP Location: Left Arm, Patient Position: Sitting, Cuff Size: Large)   Pulse 63   Temp 98.1 F (36.7 C) (Oral)   Ht 6\' 2"  (1.88 m)   Wt 216 lb (98 kg)   SpO2 95%   BMI 27.73 kg/m   BP Readings from Last 3 Encounters:  05/17/18 (!) 154/80  11/01/17 (!) 160/74  07/20/17 (!) 144/82    Wt Readings from Last 3 Encounters:  05/17/18 216 lb (98 kg)  11/01/17 215 lb (97.5 kg)  07/20/17 214 lb (97.1 kg)    Physical Exam  Constitutional: He is oriented to person, place, and time. He appears well-developed. No distress.  NAD  HENT:  Mouth/Throat: Oropharynx is clear and moist.  Eyes: Pupils are equal, round, and reactive to light. Conjunctivae are normal.  Neck: Normal range of motion. No JVD present. No thyromegaly present.  Cardiovascular: Normal rate, regular rhythm, normal heart sounds and intact distal pulses. Exam reveals no gallop and no friction rub.  No murmur heard. Pulmonary/Chest: Effort normal and breath sounds normal. No respiratory distress. He has no wheezes. He has no rales. He exhibits no tenderness.  Abdominal: Soft. Bowel sounds are normal. He exhibits no distension and no mass. There is no tenderness. There is no rebound and  no guarding.  Genitourinary: Rectum normal. Rectal exam shows guaiac negative stool.  Musculoskeletal: Normal range of motion. He exhibits no edema or tenderness.  Lymphadenopathy:    He has no cervical adenopathy.  Neurological: He is alert and oriented to person, place, and time. He has normal reflexes. No cranial nerve deficit. He exhibits normal muscle tone. He displays a negative Romberg sign. Coordination and gait normal.  Skin: Skin is warm and dry. No rash noted.  Psychiatric: He has a  normal mood and affect. His behavior is normal. Judgment and thought content normal.   Prostate 1+ Wax B   Lab Results  Component Value Date   WBC 8.5 07/20/2017   HGB 17.3 (H) 07/20/2017   HCT 50.5 07/20/2017   PLT 219.0 07/20/2017   GLUCOSE 145 (H) 07/20/2017   CHOL 175 03/23/2017   TRIG 92.0 03/23/2017   HDL 37.40 (L) 03/23/2017   LDLDIRECT 157.3 05/08/2013   LDLCALC 119 (H) 03/23/2017   ALT 33 07/20/2017   AST 27 07/20/2017   NA 139 07/20/2017   K 4.6 07/20/2017   CL 100 07/20/2017   CREATININE 1.06 07/20/2017   BUN 15 07/20/2017   CO2 33 (H) 07/20/2017   TSH 2.22 03/23/2017   PSA 0.43 03/31/2017    Dg Lumbar Spine 2-3 Views  Result Date: 11/01/2017 CLINICAL DATA:  Low back and bilateral hip pain since a fall 2 weeks ago EXAM: LUMBAR SPINE - 2-3 VIEW COMPARISON:  CT abdomen and pelvis 07/20/2017. FINDINGS: There is no fracture. The patient is status post L3-5 fusion. Hardware is intact without complicating feature. Trace anterolisthesis L4 on L5 is unchanged. Marked loss of disc space height and endplate spurring at T0-6 also appear unchanged. IMPRESSION: No acute abnormality. No change in the appearance of L2-3 degenerative disc disease. Status post L3-5 fusion without evidence of complication. Electronically Signed   By: Inge Rise M.D.   On: 11/01/2017 11:17   Dg Hips Bilat With Pelvis 3-4 Views  Result Date: 11/01/2017 CLINICAL DATA:  Status post fall 2 weeks ago.  Bilateral hip pain. EXAM: DG HIP (WITH OR WITHOUT PELVIS) 3-4V BILAT COMPARISON:  None. FINDINGS: There is no evidence of hip fracture or dislocation. There is no evidence of arthropathy or other focal bone abnormality. Posterior lumbar interbody fusion from L3 through L5. IMPRESSION: No acute osseous injury of the bilateral hips. Electronically Signed   By: Kathreen Devoid   On: 11/01/2017 11:16    Assessment & Plan:   There are no diagnoses linked to this encounter.   No orders of the defined types  were placed in this encounter.    Follow-up: No follow-ups on file.  [name], MD

## 2018-05-17 NOTE — Patient Instructions (Addendum)
Use claritin Cooler short showers  Venous insufficiency use compression knee highs Irrigate ears  Cardiac CT calcium scoring test $150   Computed tomography, more commonly known as a CT or CAT scan, is a diagnostic medical imaging test. Like traditional x-rays, it produces multiple images or pictures of the inside of the body. The cross-sectional images generated during a CT scan can be reformatted in multiple planes. They can even generate three-dimensional images. These images can be viewed on a computer monitor, printed on film or by a 3D printer, or transferred to a CD or DVD. CT images of internal organs, bones, soft tissue and blood vessels provide greater detail than traditional x-rays, particularly of soft tissues and blood vessels. A cardiac CT scan for coronary calcium is a non-invasive way of obtaining information about the presence, location and extent of calcified plaque in the coronary arteries-the vessels that supply oxygen-containing blood to the heart muscle. Calcified plaque results when there is a build-up of fat and other substances under the inner layer of the artery. This material can calcify which signals the presence of atherosclerosis, a disease of the vessel wall, also called coronary artery disease (CAD). People with this disease have an increased risk for heart attacks. In addition, over time, progression of plaque build up (CAD) can narrow the arteries or even close off blood flow to the heart. The result may be chest pain, sometimes called "angina," or a heart attack. Because calcium is a marker of CAD, the amount of calcium detected on a cardiac CT scan is a helpful prognostic tool. The findings on cardiac CT are expressed as a calcium score. Another name for this test is coronary artery calcium scoring.  What are some common uses of the procedure? The goal of cardiac CT scan for calcium scoring is to determine if CAD is present and to what extent, even if there are no  symptoms. It is a screening study that may be recommended by a physician for patients with risk factors for CAD but no clinical symptoms. The major risk factors for CAD are: . high blood cholesterol levels  . family history of heart attacks  . diabetes  . high blood pressure  . cigarette smoking  . overweight or obese  . physical inactivity   A negative cardiac CT scan for calcium scoring shows no calcification within the coronary arteries. This suggests that CAD is absent or so minimal it cannot be seen by this technique. The chance of having a heart attack over the next two to five years is very low under these circumstances. A positive test means that CAD is present, regardless of whether or not the patient is experiencing any symptoms. The amount of calcification-expressed as the calcium score-may help to predict the likelihood of a myocardial infarction (heart attack) in the coming years and helps your medical doctor or cardiologist decide whether the patient may need to take preventive medicine or undertake other measures such as diet and exercise to lower the risk for heart attack. The extent of CAD is graded according to your calcium score:  Calcium Score  Presence of CAD  0 No evidence of CAD   1-10 Minimal evidence of CAD  11-100 Mild evidence of CAD  101-400 Moderate evidence of CAD  Over 400 Extensive evidence of CAD

## 2018-05-19 ENCOUNTER — Other Ambulatory Visit: Payer: Self-pay | Admitting: Internal Medicine

## 2018-05-19 DIAGNOSIS — R739 Hyperglycemia, unspecified: Secondary | ICD-10-CM

## 2018-06-02 ENCOUNTER — Ambulatory Visit (INDEPENDENT_AMBULATORY_CARE_PROVIDER_SITE_OTHER)
Admission: RE | Admit: 2018-06-02 | Discharge: 2018-06-02 | Disposition: A | Discharge status: Home / Self Care | Payer: Self-pay | Source: Ambulatory Visit | Attending: Internal Medicine | Admitting: Internal Medicine

## 2018-06-02 DIAGNOSIS — E785 Hyperlipidemia, unspecified: Secondary | ICD-10-CM

## 2018-06-29 ENCOUNTER — Other Ambulatory Visit: Payer: Self-pay | Admitting: Internal Medicine

## 2018-07-08 ENCOUNTER — Telehealth: Payer: Self-pay | Admitting: Internal Medicine

## 2018-07-08 MED ORDER — LORAZEPAM 0.5 MG PO TABS: 0.5000 mg | ORAL_TABLET | Freq: Three times a day (TID) | ORAL | 0 refills | Status: DC | PRN

## 2018-07-08 NOTE — Telephone Encounter (Signed)
Pt informed of below and will call if he needs anything else.

## 2018-07-08 NOTE — Telephone Encounter (Signed)
Copied from [place]. Topic: General - Other >> Jul 08, 2018  9:45 AM Percell Belt A wrote: Reason for CRM:   Pt wife called in and stated that pt just rec'd some bad news. He just rec'd word that pt son committed suicide.  She is requesting if Dr Camila Li could call something in for his nerves.   He is not doing well right now with the news .    Pharmacy -[id number] - [place], [place] - [address] [phone] (Phone)  Best number -[phone]

## 2018-07-08 NOTE — Telephone Encounter (Signed)
FYI

## 2018-07-08 NOTE — Telephone Encounter (Signed)
We are so sorry to hear about the loss of his son. I will send some Ativan to help with his nerves. Can you please send this note to Dr. Alain Marion as well so he is aware?

## 2018-07-08 NOTE — Telephone Encounter (Signed)
Routing to Jodi Mourning for review while Plotnikov is out of the office.

## 2018-07-10 ENCOUNTER — Encounter: Payer: Self-pay | Admitting: Internal Medicine

## 2018-07-12 NOTE — Telephone Encounter (Signed)
Noted I'm very sorry!

## 2018-08-18 ENCOUNTER — Ambulatory Visit: Payer: Medicare HMO | Admitting: Internal Medicine

## 2018-08-26 ENCOUNTER — Encounter: Payer: Self-pay | Admitting: Internal Medicine

## 2018-08-26 ENCOUNTER — Ambulatory Visit (INDEPENDENT_AMBULATORY_CARE_PROVIDER_SITE_OTHER): Payer: Medicare HMO | Admitting: Internal Medicine

## 2018-08-26 ENCOUNTER — Telehealth: Payer: Self-pay | Admitting: Internal Medicine

## 2018-08-26 ENCOUNTER — Other Ambulatory Visit: Payer: Self-pay

## 2018-08-26 DIAGNOSIS — N320 Bladder-neck obstruction: Secondary | ICD-10-CM | POA: Diagnosis not present

## 2018-08-26 DIAGNOSIS — R69 Illness, unspecified: Secondary | ICD-10-CM | POA: Diagnosis not present

## 2018-08-26 DIAGNOSIS — F4321 Adjustment disorder with depressed mood: Secondary | ICD-10-CM | POA: Insufficient documentation

## 2018-08-26 DIAGNOSIS — R142 Eructation: Secondary | ICD-10-CM | POA: Diagnosis not present

## 2018-08-26 DIAGNOSIS — I10 Essential (primary) hypertension: Secondary | ICD-10-CM

## 2018-08-26 MED ORDER — DOXAZOSIN MESYLATE 2 MG PO TABS: 2.0000 mg | ORAL_TABLET | Freq: Every day | ORAL | 11 refills | Status: DC

## 2018-08-26 NOTE — Progress Notes (Signed)
Subjective:  Patient ID: Francis Ade., male    DOB: 1941/06/10  Age: 78 y.o. MRN: [id number]  CC: No chief complaint on file.   HPI Francis Ferris. presents for BPH sx's C/o burping, abd cramps Grieving his son's death - winter 2020  Outpatient Medications Prior to Visit  Medication Sig Dispense Refill  . aspirin EC 81 MG tablet Take 81 mg by mouth daily.    . cholecalciferol (VITAMIN D) 1000 UNITS tablet Take 1,000 Units by mouth daily.      . finasteride (PROSCAR) 5 MG tablet TAKE 1 TABLET BY MOUTH DAILY 90 tablet 1  . fluticasone (FLONASE) 50 MCG/ACT nasal spray Place 2 sprays into both nostrils daily. 16 g 6  . loratadine (CLARITIN) 10 MG tablet Take 1 tablet (10 mg total) by mouth daily. 100 tablet 3  . LORazepam (ATIVAN) 0.5 MG tablet Take 1 tablet (0.5 mg total) by mouth every 8 (eight) hours as needed for anxiety. 20 tablet 0  . Omega-3 Fatty Acids (FISH OIL) 1000 MG CAPS Take 1 capsule by mouth 2 (two) times daily.      Marland Kitchen omeprazole (PRILOSEC) 40 MG capsule TAKE ONE CAPSULE BY MOUTH EVERY DAY AS NEEDED 90 capsule 3  . tamsulosin (FLOMAX) 0.4 MG CAPS capsule Take 1 capsule (0.4 mg total) by mouth daily. 30 capsule 11  . vardenafil (LEVITRA) 20 MG tablet Take 1 tablet (20 mg total) by mouth daily as needed for erectile dysfunction. 90 tablet 1  . methylPREDNISolone (MEDROL DOSEPAK) 4 MG TBPK tablet Taper as directed 21 tablet 0   No facility-administered medications prior to visit.     ROS: Review of Systems  Constitutional: Negative for appetite change, fatigue and unexpected weight change.  HENT: Negative for congestion, nosebleeds, sneezing, sore throat and trouble swallowing.   Eyes: Negative for itching and visual disturbance.  Respiratory: Negative for cough.   Cardiovascular: Negative for chest pain, palpitations and leg swelling.  Gastrointestinal: Negative for abdominal distention, blood in stool, diarrhea and nausea.  Genitourinary: Positive for  difficulty urinating, frequency and urgency. Negative for hematuria.  Musculoskeletal: Negative for back pain, gait problem, joint swelling and neck pain.  Skin: Negative for rash.  Neurological: Negative for dizziness, tremors, speech difficulty and weakness.  Psychiatric/Behavioral: Negative for agitation, dysphoric mood, sleep disturbance and suicidal ideas. The patient is not nervous/anxious.     Objective:  BP (!) 156/84 (BP Location: Left Arm, Patient Position: Sitting, Cuff Size: Normal)   Pulse 64   Temp 98.1 F (36.7 C) (Oral)   Ht 6\' 2"  (1.88 m)   Wt 208 lb (94.3 kg)   SpO2 99%   BMI 26.71 kg/m   BP Readings from Last 3 Encounters:  08/26/18 (!) 156/84  05/17/18 (!) 154/80  11/01/17 (!) 160/74    Wt Readings from Last 3 Encounters:  08/26/18 208 lb (94.3 kg)  05/17/18 216 lb (98 kg)  11/01/17 215 lb (97.5 kg)    Physical Exam Constitutional:      General: He is not in acute distress.    Appearance: He is well-developed.     Comments: NAD  Eyes:     Conjunctiva/sclera: Conjunctivae normal.     Pupils: Pupils are equal, round, and reactive to light.  Neck:     Musculoskeletal: Normal range of motion.     Thyroid: No thyromegaly.     Vascular: No JVD.  Cardiovascular:     Rate and Rhythm: Normal rate and regular  rhythm.     Heart sounds: Normal heart sounds. No murmur. No friction rub. No gallop.   Pulmonary:     Effort: Pulmonary effort is normal. No respiratory distress.     Breath sounds: Normal breath sounds. No wheezing or rales.  Chest:     Chest wall: No tenderness.  Abdominal:     General: Bowel sounds are normal. There is no distension.     Palpations: Abdomen is soft. There is no mass.     Tenderness: There is no abdominal tenderness. There is no guarding or rebound.  Musculoskeletal: Normal range of motion.        General: No tenderness.  Lymphadenopathy:     Cervical: No cervical adenopathy.  Skin:    General: Skin is warm and dry.      Findings: No rash.  Neurological:     Mental Status: He is alert and oriented to person, place, and time.     Cranial Nerves: No cranial nerve deficit.     Motor: No abnormal muscle tone.     Coordination: Coordination normal.     Gait: Gait normal.     Deep Tendon Reflexes: Reflexes are normal and symmetric.  Psychiatric:        Behavior: Behavior normal.        Thought Content: Thought content normal.        Judgment: Judgment normal.     Lab Results  Component Value Date   WBC 9.7 05/17/2018   HGB 16.1 05/17/2018   HCT 47.2 05/17/2018   PLT 209.0 05/17/2018   GLUCOSE 167 (H) 05/17/2018   CHOL 193 05/17/2018   TRIG 130.0 05/17/2018   HDL 31.80 (L) 05/17/2018   LDLDIRECT 157.3 05/08/2013   LDLCALC 135 (H) 05/17/2018   ALT 40 05/17/2018   AST 28 05/17/2018   NA 137 05/17/2018   K 4.3 05/17/2018   CL 102 05/17/2018   CREATININE 0.97 05/17/2018   BUN 16 05/17/2018   CO2 25 05/17/2018   TSH 1.84 05/17/2018   PSA 0.30 05/17/2018    Ct Cardiac Scoring  Addendum Date: 06/02/2018   ADDENDUM REPORT: 06/02/2018 10:49 CLINICAL DATA:  Risk stratification EXAM: Coronary Calcium Score TECHNIQUE: The patient was scanned on a [employer] scanner. Axial non-contrast 3 mm slices were carried out through the heart. The data set was analyzed on a dedicated work station and scored using the [place]. FINDINGS: Non-cardiac: See separate report from [facility]. Ascending Aorta: Normal size, trivial diffuse calcifications. Pericardium: Normal. Coronary arteries: Normal origin. IMPRESSION: Coronary calcium score of 216. This was 41 percentile for age and sex matched control. Electronically Signed   By: Ena Dawley   On: 06/02/2018 10:49   Result Date: 06/02/2018 EXAM: OVER-READ INTERPRETATION  CT CHEST The following report is an over-read performed by radiologist Dr. Rolm Baptise of [facility], [place] on 06/02/2018. This over-read does not include interpretation of  cardiac or coronary anatomy or pathology. The coronary calcium score interpretation by the cardiologist is attached. COMPARISON:  None. FINDINGS: Vascular: Heart is normal size.  Visualized aorta normal caliber. Mediastinum/Nodes: No adenopathy in the lower mediastinum or hila. Lungs/Pleura: No confluent opacities or effusions. Lingular scarring. Upper Abdomen: Imaging into the upper abdomen shows no acute findings. Musculoskeletal: Chest wall soft tissues are unremarkable. No acute bony abnormality. IMPRESSION: No acute or significant extracardiac abnormality. Electronically Signed: By: Rolm Baptise M.D. On: 06/02/2018 10:21    Assessment & Plan:   There are no diagnoses  linked to this encounter.   No orders of the defined types were placed in this encounter.    Follow-up: No follow-ups on file.  [name], MD

## 2018-08-26 NOTE — Assessment & Plan Note (Signed)
Grieving his son's death

## 2018-08-26 NOTE — Telephone Encounter (Signed)
Copied from [place] [id number]. Topic: General - Other >> Aug 26, 2018 11:53 AM Claudia Desanctis M wrote: Relation to pt: self  Call back number: [phone] [id number] - [place], [place] - [address] [phone] (Phone) [phone] (Fax)  Reason for call:  Patient was seen today by Dr. Alain Marion and doxazosin (CARDURA) 2 MG tablet was prescribed, pharmacist advised patient to get clarity from PCP if he should continue taking tamsulosin (FLOMAX) 0.4 MG CAPS capsule or should he d/c due to doxazosin (CARDURA) 2 MG tablet being in the same class, please advise patient

## 2018-08-26 NOTE — Assessment & Plan Note (Signed)
Cardura added

## 2018-08-26 NOTE — Assessment & Plan Note (Signed)
Refused GI referral

## 2018-08-26 NOTE — Telephone Encounter (Signed)
Please advise 

## 2018-08-26 NOTE — Patient Instructions (Signed)
L Arginine

## 2018-08-26 NOTE — Assessment & Plan Note (Addendum)
Flomax  Finasteride  Cardura  Added Urol ref

## 2018-08-27 NOTE — Addendum Note (Signed)
Addended by: Cassandria Anger on: 08/27/2018 09:42 AM   Modules accepted: Orders

## 2018-08-27 NOTE — Telephone Encounter (Signed)
I am sorry.  I guess we will have to stop Flomax.  Thank you

## 2018-08-30 NOTE — Telephone Encounter (Signed)
Pt.notified

## 2018-09-09 DIAGNOSIS — N401 Enlarged prostate with lower urinary tract symptoms: Secondary | ICD-10-CM | POA: Diagnosis not present

## 2018-09-09 DIAGNOSIS — R351 Nocturia: Secondary | ICD-10-CM | POA: Diagnosis not present

## 2018-11-09 DIAGNOSIS — D225 Melanocytic nevi of trunk: Secondary | ICD-10-CM | POA: Diagnosis not present

## 2018-11-09 DIAGNOSIS — L570 Actinic keratosis: Secondary | ICD-10-CM | POA: Diagnosis not present

## 2018-11-09 DIAGNOSIS — L720 Epidermal cyst: Secondary | ICD-10-CM | POA: Diagnosis not present

## 2018-11-09 DIAGNOSIS — Z85828 Personal history of other malignant neoplasm of skin: Secondary | ICD-10-CM | POA: Diagnosis not present

## 2018-11-09 DIAGNOSIS — D0439 Carcinoma in situ of skin of other parts of face: Secondary | ICD-10-CM | POA: Diagnosis not present

## 2018-11-09 DIAGNOSIS — L821 Other seborrheic keratosis: Secondary | ICD-10-CM | POA: Diagnosis not present

## 2018-11-09 DIAGNOSIS — D485 Neoplasm of uncertain behavior of skin: Secondary | ICD-10-CM | POA: Diagnosis not present

## 2018-11-11 ENCOUNTER — Telehealth: Payer: Self-pay | Admitting: Emergency Medicine

## 2018-11-23 ENCOUNTER — Other Ambulatory Visit: Payer: Self-pay

## 2018-11-23 ENCOUNTER — Encounter: Payer: Self-pay | Admitting: Internal Medicine

## 2018-11-23 ENCOUNTER — Other Ambulatory Visit (INDEPENDENT_AMBULATORY_CARE_PROVIDER_SITE_OTHER): Payer: Medicare HMO

## 2018-11-23 ENCOUNTER — Ambulatory Visit (INDEPENDENT_AMBULATORY_CARE_PROVIDER_SITE_OTHER): Payer: Medicare HMO | Admitting: Internal Medicine

## 2018-11-23 VITALS — BP 144/78 | HR 62 | Temp 97.9°F | Ht 74.0 in | Wt 205.0 lb

## 2018-11-23 DIAGNOSIS — L299 Pruritus, unspecified: Secondary | ICD-10-CM

## 2018-11-23 DIAGNOSIS — H5713 Ocular pain, bilateral: Secondary | ICD-10-CM | POA: Diagnosis not present

## 2018-11-23 DIAGNOSIS — R142 Eructation: Secondary | ICD-10-CM | POA: Diagnosis not present

## 2018-11-23 DIAGNOSIS — N320 Bladder-neck obstruction: Secondary | ICD-10-CM | POA: Diagnosis not present

## 2018-11-23 DIAGNOSIS — N3281 Overactive bladder: Secondary | ICD-10-CM

## 2018-11-23 LAB — CBC WITH DIFFERENTIAL/PLATELET
Basophils Absolute: 0.1 10*3/uL (ref 0.0–0.1)
Basophils Relative: 0.9 % (ref 0.0–3.0)
Eosinophils Absolute: 0.1 10*3/uL (ref 0.0–0.7)
Eosinophils Relative: 1.9 % (ref 0.0–5.0)
HCT: 47.1 % (ref 39.0–52.0)
Hemoglobin: 15.6 g/dL (ref 13.0–17.0)
Lymphocytes Relative: 30.5 % (ref 12.0–46.0)
Lymphs Abs: 2.2 10*3/uL (ref 0.7–4.0)
MCHC: 33.3 g/dL (ref 30.0–36.0)
MCV: 85 fl (ref 78.0–100.0)
Monocytes Absolute: 0.8 10*3/uL (ref 0.1–1.0)
Monocytes Relative: 10.5 % (ref 3.0–12.0)
Neutro Abs: 4.1 10*3/uL (ref 1.4–7.7)
Neutrophils Relative %: 56.2 % (ref 43.0–77.0)
Platelets: 208 10*3/uL (ref 150.0–400.0)
RBC: 5.54 Mil/uL (ref 4.22–5.81)
RDW: 14.3 % (ref 11.5–15.5)
WBC: 7.2 10*3/uL (ref 4.0–10.5)

## 2018-11-23 LAB — BASIC METABOLIC PANEL
BUN: 20 mg/dL (ref 6–23)
CO2: 26 mEq/L (ref 19–32)
Calcium: 9.1 mg/dL (ref 8.4–10.5)
Chloride: 103 mEq/L (ref 96–112)
Creatinine, Ser: 1.01 mg/dL (ref 0.40–1.50)
GFR: 71.84 mL/min (ref >60.00)
Glucose, Bld: 182 mg/dL — ABNORMAL HIGH (ref 70–99)
Potassium: 4.3 mEq/L (ref 3.5–5.1)
Sodium: 138 mEq/L (ref 135–145)

## 2018-11-23 LAB — HEPATIC FUNCTION PANEL
ALT: 25 U/L (ref 0–53)
AST: 24 U/L (ref 0–37)
Albumin: 4.3 g/dL (ref 3.5–5.2)
Alkaline Phosphatase: 85 U/L (ref 39–117)
Bilirubin, Direct: 0.1 mg/dL (ref 0.0–0.3)
Total Bilirubin: 0.7 mg/dL (ref 0.2–1.2)
Total Protein: 7.1 g/dL (ref 6.0–8.3)

## 2018-11-23 LAB — TSH: TSH: 2.18 u[IU]/mL (ref 0.35–4.50)

## 2018-11-23 MED ORDER — CETIRIZINE HCL 10 MG PO TABS: 10.0000 mg | ORAL_TABLET | Freq: Every day | ORAL | 11 refills | Status: DC

## 2018-11-23 MED ORDER — OLOPATADINE HCL 0.1 % OP SOLN: 1.0000 [drp] | Freq: Two times a day (BID) | OPHTHALMIC | 1 refills | Status: AC

## 2018-11-23 MED ORDER — HYDROXYZINE HCL 25 MG PO TABS: 25.0000 mg | ORAL_TABLET | Freq: Three times a day (TID) | ORAL | 1 refills | Status: DC | PRN

## 2018-11-23 NOTE — Assessment & Plan Note (Signed)
Much improved on omeprazole

## 2018-11-23 NOTE — Assessment & Plan Note (Addendum)
?  cause Use less soap, cool shower Labs Hydroxyzine

## 2018-11-23 NOTE — Progress Notes (Signed)
Subjective:  Patient ID: Francis Ade., male    DOB: 26-Feb-1941  Age: 78 y.o. MRN: [id number]  CC: No chief complaint on file.   HPI Francis Sims. presents for eyes hurt C/o itching all over x weeks F/u [facility] - saw dr Louis Meckel Burping is better  Outpatient Medications Prior to Visit  Medication Sig Dispense Refill  . aspirin EC 81 MG tablet Take 81 mg by mouth daily.    . cholecalciferol (VITAMIN D) 1000 UNITS tablet Take 1,000 Units by mouth daily.      Marland Kitchen doxazosin (CARDURA) 2 MG tablet Take 1 tablet (2 mg total) by mouth daily. 30 tablet 11  . finasteride (PROSCAR) 5 MG tablet TAKE 1 TABLET BY MOUTH DAILY 90 tablet 1  . fluticasone (FLONASE) 50 MCG/ACT nasal spray Place 2 sprays into both nostrils daily. 16 g 6  . loratadine (CLARITIN) 10 MG tablet Take 1 tablet (10 mg total) by mouth daily. 100 tablet 3  . LORazepam (ATIVAN) 0.5 MG tablet Take 1 tablet (0.5 mg total) by mouth every 8 (eight) hours as needed for anxiety. 20 tablet 0  . Omega-3 Fatty Acids (FISH OIL) 1000 MG CAPS Take 1 capsule by mouth 2 (two) times daily.      Marland Kitchen omeprazole (PRILOSEC) 40 MG capsule TAKE ONE CAPSULE BY MOUTH EVERY DAY AS NEEDED 90 capsule 3  . solifenacin (VESICARE) 10 MG tablet Take 10 mg by mouth daily.    . vardenafil (LEVITRA) 20 MG tablet Take 1 tablet (20 mg total) by mouth daily as needed for erectile dysfunction. 90 tablet 1   No facility-administered medications prior to visit.     ROS: Review of Systems  Constitutional: Negative for appetite change, fatigue and unexpected weight change.  HENT: Negative for congestion, nosebleeds, sneezing, sore throat and trouble swallowing.   Eyes: Negative for itching and visual disturbance.  Respiratory: Negative for cough.   Cardiovascular: Negative for chest pain, palpitations and leg swelling.  Gastrointestinal: Negative for abdominal distention, blood in stool, diarrhea and nausea.  Genitourinary: Positive for difficulty urinating,  frequency and urgency. Negative for decreased urine volume and hematuria.  Musculoskeletal: Negative for back pain, gait problem, joint swelling and neck pain.  Skin: Negative for rash.  Neurological: Negative for dizziness, tremors, speech difficulty and weakness.  Psychiatric/Behavioral: Negative for agitation, dysphoric mood, sleep disturbance and suicidal ideas. The patient is not nervous/anxious.     Objective:  BP (!) 144/78 (BP Location: Left Arm, Patient Position: Sitting, Cuff Size: Normal)   Pulse 62   Temp 97.9 F (36.6 C) (Oral)   Ht 6\' 2"  (1.88 m)   Wt 205 lb (93 kg)   SpO2 97%   BMI 26.32 kg/m   BP Readings from Last 3 Encounters:  11/23/18 (!) 144/78  08/26/18 (!) 156/84  05/17/18 (!) 154/80    Wt Readings from Last 3 Encounters:  11/23/18 205 lb (93 kg)  08/26/18 208 lb (94.3 kg)  05/17/18 216 lb (98 kg)    Physical Exam Constitutional:      General: He is not in acute distress.    Appearance: He is well-developed.     Comments: NAD  Eyes:     Conjunctiva/sclera: Conjunctivae normal.     Pupils: Pupils are equal, round, and reactive to light.  Neck:     Musculoskeletal: Normal range of motion.     Thyroid: No thyromegaly.     Vascular: No JVD.  Cardiovascular:  Rate and Rhythm: Normal rate and regular rhythm.     Heart sounds: Normal heart sounds. No murmur. No friction rub. No gallop.   Pulmonary:     Effort: Pulmonary effort is normal. No respiratory distress.     Breath sounds: Normal breath sounds. No wheezing or rales.  Chest:     Chest wall: No tenderness.  Abdominal:     General: Bowel sounds are normal. There is no distension.     Palpations: Abdomen is soft. There is no mass.     Tenderness: There is no abdominal tenderness. There is no guarding or rebound.  Musculoskeletal: Normal range of motion.        General: No tenderness.  Lymphadenopathy:     Cervical: No cervical adenopathy.  Skin:    General: Skin is warm and dry.      Findings: No rash.  Neurological:     Mental Status: He is alert and oriented to person, place, and time.     Cranial Nerves: No cranial nerve deficit.     Motor: No abnormal muscle tone.     Coordination: Coordination normal.     Gait: Gait normal.     Deep Tendon Reflexes: Reflexes are normal and symmetric.  Psychiatric:        Behavior: Behavior normal.        Thought Content: Thought content normal.        Judgment: Judgment normal.    No rash No HSM   Lab Results  Component Value Date   WBC 9.7 05/17/2018   HGB 16.1 05/17/2018   HCT 47.2 05/17/2018   PLT 209.0 05/17/2018   GLUCOSE 167 (H) 05/17/2018   CHOL 193 05/17/2018   TRIG 130.0 05/17/2018   HDL 31.80 (L) 05/17/2018   LDLDIRECT 157.3 05/08/2013   LDLCALC 135 (H) 05/17/2018   ALT 40 05/17/2018   AST 28 05/17/2018   NA 137 05/17/2018   K 4.3 05/17/2018   CL 102 05/17/2018   CREATININE 0.97 05/17/2018   BUN 16 05/17/2018   CO2 25 05/17/2018   TSH 1.84 05/17/2018   PSA 0.30 05/17/2018    Ct Cardiac Scoring  Addendum Date: 06/02/2018   ADDENDUM REPORT: 06/02/2018 10:49 CLINICAL DATA:  Risk stratification EXAM: Coronary Calcium Score TECHNIQUE: The patient was scanned on a [employer] scanner. Axial non-contrast 3 mm slices were carried out through the heart. The data set was analyzed on a dedicated work station and scored using the [place]. FINDINGS: Non-cardiac: See separate report from [facility]. Ascending Aorta: Normal size, trivial diffuse calcifications. Pericardium: Normal. Coronary arteries: Normal origin. IMPRESSION: Coronary calcium score of 216. This was 78 percentile for age and sex matched control. Electronically Signed   By: Ena Dawley   On: 06/02/2018 10:49   Result Date: 06/02/2018 EXAM: OVER-READ INTERPRETATION  CT CHEST The following report is an over-read performed by radiologist Dr. Rolm Baptise of [facility], [place] on 06/02/2018. This over-read does not include  interpretation of cardiac or coronary anatomy or pathology. The coronary calcium score interpretation by the cardiologist is attached. COMPARISON:  None. FINDINGS: Vascular: Heart is normal size.  Visualized aorta normal caliber. Mediastinum/Nodes: No adenopathy in the lower mediastinum or hila. Lungs/Pleura: No confluent opacities or effusions. Lingular scarring. Upper Abdomen: Imaging into the upper abdomen shows no acute findings. Musculoskeletal: Chest wall soft tissues are unremarkable. No acute bony abnormality. IMPRESSION: No acute or significant extracardiac abnormality. Electronically Signed: By: Rolm Baptise M.D. On: 06/02/2018 10:21  Assessment & Plan:   There are no diagnoses linked to this encounter.   No orders of the defined types were placed in this encounter.    Follow-up: No follow-ups on file.  [name], MD

## 2018-11-23 NOTE — Assessment & Plan Note (Signed)
He will see his eye dr Karenann Cai and Zyrtec

## 2018-11-23 NOTE — Assessment & Plan Note (Signed)
Ref to Dr Vernie Shanks

## 2018-11-25 ENCOUNTER — Other Ambulatory Visit (INDEPENDENT_AMBULATORY_CARE_PROVIDER_SITE_OTHER): Payer: Medicare HMO

## 2018-11-25 DIAGNOSIS — R739 Hyperglycemia, unspecified: Secondary | ICD-10-CM | POA: Diagnosis not present

## 2018-11-25 LAB — HEMOGLOBIN A1C: Hgb A1c MFr Bld: 6.5 % (ref 4.6–6.5)

## 2018-12-07 NOTE — Telephone Encounter (Signed)
Error

## 2018-12-14 DIAGNOSIS — N401 Enlarged prostate with lower urinary tract symptoms: Secondary | ICD-10-CM | POA: Diagnosis not present

## 2018-12-14 DIAGNOSIS — R351 Nocturia: Secondary | ICD-10-CM | POA: Diagnosis not present

## 2018-12-21 DIAGNOSIS — R351 Nocturia: Secondary | ICD-10-CM | POA: Diagnosis not present

## 2018-12-21 DIAGNOSIS — N5089 Other specified disorders of the male genital organs: Secondary | ICD-10-CM | POA: Diagnosis not present

## 2018-12-21 DIAGNOSIS — N401 Enlarged prostate with lower urinary tract symptoms: Secondary | ICD-10-CM | POA: Diagnosis not present

## 2018-12-25 ENCOUNTER — Other Ambulatory Visit: Payer: Self-pay | Admitting: Internal Medicine

## 2019-01-04 ENCOUNTER — Other Ambulatory Visit: Payer: Self-pay

## 2019-01-04 ENCOUNTER — Other Ambulatory Visit (INDEPENDENT_AMBULATORY_CARE_PROVIDER_SITE_OTHER): Payer: Medicare HMO

## 2019-01-04 ENCOUNTER — Ambulatory Visit (INDEPENDENT_AMBULATORY_CARE_PROVIDER_SITE_OTHER): Payer: Medicare HMO | Admitting: Internal Medicine

## 2019-01-04 ENCOUNTER — Encounter: Payer: Self-pay | Admitting: Internal Medicine

## 2019-01-04 VITALS — BP 150/80 | HR 70 | Temp 98.2°F | Ht 74.0 in | Wt 205.0 lb

## 2019-01-04 DIAGNOSIS — R634 Abnormal weight loss: Secondary | ICD-10-CM

## 2019-01-04 DIAGNOSIS — L299 Pruritus, unspecified: Secondary | ICD-10-CM | POA: Diagnosis not present

## 2019-01-04 DIAGNOSIS — R739 Hyperglycemia, unspecified: Secondary | ICD-10-CM | POA: Diagnosis not present

## 2019-01-04 LAB — BASIC METABOLIC PANEL
BUN: 23 mg/dL (ref 6–23)
CO2: 29 mEq/L (ref 19–32)
Calcium: 9.6 mg/dL (ref 8.4–10.5)
Chloride: 102 mEq/L (ref 96–112)
Creatinine, Ser: 1.02 mg/dL (ref 0.40–1.50)
GFR: 71.01 mL/min (ref >60.00)
Glucose, Bld: 132 mg/dL — ABNORMAL HIGH (ref 70–99)
Potassium: 4.9 mEq/L (ref 3.5–5.1)
Sodium: 139 mEq/L (ref 135–145)

## 2019-01-04 LAB — HEPATIC FUNCTION PANEL
ALT: 23 U/L (ref 0–53)
AST: 20 U/L (ref 0–37)
Albumin: 4.5 g/dL (ref 3.5–5.2)
Alkaline Phosphatase: 70 U/L (ref 39–117)
Bilirubin, Direct: 0.2 mg/dL (ref 0.0–0.3)
Total Bilirubin: 1.2 mg/dL (ref 0.2–1.2)
Total Protein: 7.4 g/dL (ref 6.0–8.3)

## 2019-01-04 LAB — GLUCOSE, POCT (MANUAL RESULT ENTRY): POC Glucose: 153 mg/dl — AB (ref 70–99)

## 2019-01-04 LAB — SEDIMENTATION RATE: Sed Rate: 10 mm/hr (ref 0–20)

## 2019-01-04 MED ORDER — METFORMIN HCL 500 MG PO TABS: 500.0000 mg | ORAL_TABLET | Freq: Two times a day (BID) | ORAL | 3 refills | Status: DC

## 2019-01-04 NOTE — Patient Instructions (Signed)
These suggestions will probably help you to improve your metabolism if you are not overweight and to lose weight if you are overweight: 1.  Reduce your consumption of sugars and starches.  Eliminate high fructose corn syrup from your diet.  Reduce your consumption of processed foods.  For desserts try to have seasonal fruits, berries with with green, nuts, cheeses or dark chocolate with more than 70% cacao. 2.  Do not snack 3.  You do not have to eat breakfast.  If you choose to have breakfast-eat plain greek yogurt, eggs, oatmeal (without sugar) 4.  Drink water, freshly brewed unsweetened tea (green, black or herbal) or coffee.  Do not drink sodas including diet sodas , juices, beverages sweetened with artificial sweeteners. 5.  Reduce your consumption of refined grains. 6.  Avoid protein drinks such as Optifast, Slim fast etc. Eat chicken, fish, meat, dairy and beans for your sources of protein 7.  Natural unprocessed fats like cold pressed virgin olive oil, butter, coconut oil are good for you.  Eat avocados 8.  Increase your consumption of fiber.  Fruits, berries, vegetables, whole grains, flaxseeds, Chia seeds, beans, popcorn, nuts, oatmeal are good sources of fiber 9.  Use vinegar in your diet, i.e. apple cider vinegar, red wine or balsamic vinegar 10.  You can try fasting.  For example you can skip breakfast and lunch every other day (24-hour fast) 11.  Stress reduction, good night sleep, relaxation, meditation, yoga and other physical activity is likely to help you to maintain low weight too. 12.  If you drink alcohol, limit your alcohol intake to no more than 2 drinks a day.   Mediterranean diet is good for you.  The Mediterranean diet is a way of eating based on the traditional cuisine of countries bordering the [place] Sea. While there is no single definition of the Mediterranean diet, it is typically high in vegetables, fruits, whole grains, beans, nut and seeds, and olive  oil. The main components of Mediterranean diet include: . Daily consumption of vegetables, fruits, whole grains and healthy fats  . Weekly intake of fish, poultry, beans and eggs  . Moderate portions of dairy products  . Limited intake of red meat Other important elements of the Mediterranean diet are sharing meals with family and friends, enjoying a glass of red wine and being physically active. Health benefits of a Mediterranean diet: A traditional Mediterranean diet consisting of large quantities of fresh fruits and vegetables, nuts, fish and olive oil-coupled with physical activity-can reduce your risk of serious mental and physical health problems by: Preventing heart disease and strokes. Following a Mediterranean diet limits your intake of refined breads, processed foods, and red meat, and encourages drinking red wine instead of hard liquor-all factors that can help prevent heart disease and stroke. Keeping you agile. If you're an older adult, the nutrients gained with a Mediterranean diet may reduce your risk of developing muscle weakness and other signs of frailty by about 70 percent. Reducing the risk of Alzheimer's. Research suggests that the Mediterranean diet may improve cholesterol, blood sugar levels, and overall blood vessel health, which in turn may reduce your risk of Alzheimer's disease or dementia. Halving the risk of Parkinson's disease. The high levels of antioxidants in the Mediterranean diet can prevent cells from undergoing a damaging process called oxidative stress, thereby cutting the risk of Parkinson's disease in half. Increasing longevity. By reducing your risk of developing heart disease or cancer with the Mediterranean diet, you're reducing your risk   of death at any age by 20%. Protecting against type 2 diabetes. A Mediterranean diet is rich in fiber which digests slowly, prevents huge swings in blood sugar, and can help you maintain a healthy weight.    Cabbage soup  recipe that will not make you gain weight: Take 1 small head of CABG, 1 average pack of celery, 4 green peppers, 4 onions, 2 cans diced tomatoes (they are not available without salt), salt and spices to taste.  Chop cabbage, celery, peppers and onions.  And tomatoes and 2-2.5 liters (2.5 quarts) of water so that it would just cover the vegetables.  Bring to boil.  Add spices and salt.  Turn heat to low/medium and simmer for 20-25 minutes.  Naturally, you can make a smaller batch and change some of the ingredients.  

## 2019-01-04 NOTE — Assessment & Plan Note (Signed)
hyperglycemia related vs other Treat hyperglycemia Abd [place] [facility] - SPEP

## 2019-01-04 NOTE — Progress Notes (Signed)
Subjective:  Patient ID: Francis Ade., male    DOB: [birth year]  Age: 78 y.o. MRN: [id number]  CC: No chief complaint on file.   HPI Francis Sims. presents for pruritis f/u - Hydroxyzine was too strong, he stopped it. Itching is 15% better C/o wt loss - mild F/u BPH - saw Dr Diona Fanti   Outpatient Medications Prior to Visit  Medication Sig Dispense Refill  . aspirin EC 81 MG tablet Take 81 mg by mouth daily.    . cetirizine (ZYRTEC ALLERGY) 10 MG tablet Take 1 tablet (10 mg total) by mouth daily. 30 tablet 11  . cholecalciferol (VITAMIN D) 1000 UNITS tablet Take 1,000 Units by mouth daily.      Marland Kitchen doxazosin (CARDURA) 2 MG tablet Take 1 tablet (2 mg total) by mouth daily. 30 tablet 11  . finasteride (PROSCAR) 5 MG tablet TAKE 1 TABLET BY MOUTH EVERY DAY 90 tablet 3  . fluticasone (FLONASE) 50 MCG/ACT nasal spray Place 2 sprays into both nostrils daily. 16 g 6  . LORazepam (ATIVAN) 0.5 MG tablet Take 1 tablet (0.5 mg total) by mouth every 8 (eight) hours as needed for anxiety. 20 tablet 0  . olopatadine (PATANOL) 0.1 % ophthalmic solution Place 1 drop into both eyes 2 (two) times daily. 5 mL 1  . Omega-3 Fatty Acids (FISH OIL) 1000 MG CAPS Take 1 capsule by mouth 2 (two) times daily.      Marland Kitchen omeprazole (PRILOSEC) 40 MG capsule TAKE ONE CAPSULE BY MOUTH EVERY DAY AS NEEDED 90 capsule 3  . solifenacin (VESICARE) 10 MG tablet Take 10 mg by mouth daily.    . vardenafil (LEVITRA) 20 MG tablet Take 1 tablet (20 mg total) by mouth daily as needed for erectile dysfunction. 90 tablet 1  . hydrOXYzine (ATARAX/VISTARIL) 25 MG tablet Take 1 tablet (25 mg total) by mouth every 8 (eight) hours as needed for itching. (Patient not taking: Reported on 01/04/2019) 60 tablet 1   No facility-administered medications prior to visit.     ROS: Review of Systems  Constitutional: Negative for appetite change, diaphoresis, fatigue, fever and unexpected weight change.  HENT: Negative for congestion,  nosebleeds, sneezing, sore throat and trouble swallowing.   Eyes: Negative for itching and visual disturbance.  Respiratory: Negative for cough.   Cardiovascular: Negative for chest pain, palpitations and leg swelling.  Gastrointestinal: Negative for abdominal distention, blood in stool, diarrhea and nausea.  Genitourinary: Negative for frequency and hematuria.  Musculoskeletal: Negative for back pain, gait problem, joint swelling and neck pain.  Skin: Negative for color change, pallor and rash.  Neurological: Negative for dizziness, tremors, speech difficulty and weakness.  Psychiatric/Behavioral: Negative for agitation, dysphoric mood and sleep disturbance. The patient is not nervous/anxious.     Objective:  BP (!) 150/80 (BP Location: Left Arm, Patient Position: Sitting, Cuff Size: Large)   Pulse 70   Temp 98.2 F (36.8 C) (Oral)   Ht 6\' 2"  (1.88 m)   Wt 205 lb (93 kg)   SpO2 98%   BMI 26.32 kg/m   BP Readings from Last 3 Encounters:  01/04/19 (!) 150/80  11/23/18 (!) 144/78  08/26/18 (!) 156/84    Wt Readings from Last 3 Encounters:  01/04/19 205 lb (93 kg)  11/23/18 205 lb (93 kg)  08/26/18 208 lb (94.3 kg)    Physical Exam Constitutional:      General: He is not in acute distress.    Appearance: Normal appearance. He  is well-developed.     Comments: NAD  Eyes:     Conjunctiva/sclera: Conjunctivae normal.     Pupils: Pupils are equal, round, and reactive to light.  Neck:     Musculoskeletal: Normal range of motion.     Thyroid: No thyromegaly.     Vascular: No JVD.  Cardiovascular:     Rate and Rhythm: Normal rate and regular rhythm.     Heart sounds: Normal heart sounds. No murmur. No friction rub. No gallop.   Pulmonary:     Effort: Pulmonary effort is normal. No respiratory distress.     Breath sounds: Normal breath sounds. No wheezing or rales.  Chest:     Chest wall: No tenderness.  Abdominal:     General: Bowel sounds are normal. There is no  distension.     Palpations: Abdomen is soft. There is no mass.     Tenderness: There is no abdominal tenderness. There is no guarding or rebound.  Musculoskeletal: Normal range of motion.        General: No tenderness.  Lymphadenopathy:     Cervical: No cervical adenopathy.  Skin:    General: Skin is warm and dry.     Findings: No rash.  Neurological:     Mental Status: He is alert and oriented to person, place, and time.     Cranial Nerves: No cranial nerve deficit.     Motor: No abnormal muscle tone.     Coordination: Coordination normal.     Gait: Gait normal.     Deep Tendon Reflexes: Reflexes are normal and symmetric.  Psychiatric:        Behavior: Behavior normal.        Thought Content: Thought content normal.        Judgment: Judgment normal.   no rash  Lab Results  Component Value Date   WBC 7.2 11/23/2018   HGB 15.6 11/23/2018   HCT 47.1 11/23/2018   PLT 208.0 11/23/2018   GLUCOSE 182 (H) 11/23/2018   CHOL 193 05/17/2018   TRIG 130.0 05/17/2018   HDL 31.80 (L) 05/17/2018   LDLDIRECT 157.3 05/08/2013   LDLCALC 135 (H) 05/17/2018   ALT 25 11/23/2018   AST 24 11/23/2018   NA 138 11/23/2018   K 4.3 11/23/2018   CL 103 11/23/2018   CREATININE 1.01 11/23/2018   BUN 20 11/23/2018   CO2 26 11/23/2018   TSH 2.18 11/23/2018   PSA 0.30 05/17/2018   HGBA1C 6.5 11/25/2018    Ct Cardiac Scoring  Addendum Date: 06/02/2018   ADDENDUM REPORT: 06/02/2018 10:49 CLINICAL DATA:  Risk stratification EXAM: Coronary Calcium Score TECHNIQUE: The patient was scanned on a [employer] scanner. Axial non-contrast 3 mm slices were carried out through the heart. The data set was analyzed on a dedicated work station and scored using the [place]. FINDINGS: Non-cardiac: See separate report from [facility]. Ascending Aorta: Normal size, trivial diffuse calcifications. Pericardium: Normal. Coronary arteries: Normal origin. IMPRESSION: Coronary calcium score of 216. This  was 48 percentile for age and sex matched control. Electronically Signed   By: Ena Dawley   On: 06/02/2018 10:49   Result Date: 06/02/2018 EXAM: OVER-READ INTERPRETATION  CT CHEST The following report is an over-read performed by radiologist Dr. Rolm Baptise of [facility], [place] on 06/02/2018. This over-read does not include interpretation of cardiac or coronary anatomy or pathology. The coronary calcium score interpretation by the cardiologist is attached. COMPARISON:  None. FINDINGS: Vascular: Heart is normal  size.  Visualized aorta normal caliber. Mediastinum/Nodes: No adenopathy in the lower mediastinum or hila. Lungs/Pleura: No confluent opacities or effusions. Lingular scarring. Upper Abdomen: Imaging into the upper abdomen shows no acute findings. Musculoskeletal: Chest wall soft tissues are unremarkable. No acute bony abnormality. IMPRESSION: No acute or significant extracardiac abnormality. Electronically Signed: By: Rolm Baptise M.D. On: 06/02/2018 10:21    Assessment & Plan:   There are no diagnoses linked to this encounter.   No orders of the defined types were placed in this encounter.    Follow-up: No follow-ups on file.  [name], MD

## 2019-01-04 NOTE — Assessment & Plan Note (Signed)
Metformin 

## 2019-01-04 NOTE — Assessment & Plan Note (Signed)
Labs Abd US 

## 2019-01-05 NOTE — Addendum Note (Signed)
Addended by: Cresenciano Lick on: 01/05/2019 11:33 AM   Modules accepted: Orders

## 2019-01-19 ENCOUNTER — Encounter: Payer: Self-pay | Admitting: Internal Medicine

## 2019-01-25 DIAGNOSIS — N401 Enlarged prostate with lower urinary tract symptoms: Secondary | ICD-10-CM | POA: Diagnosis not present

## 2019-01-25 DIAGNOSIS — R351 Nocturia: Secondary | ICD-10-CM | POA: Diagnosis not present

## 2019-01-30 ENCOUNTER — Other Ambulatory Visit: Payer: Self-pay

## 2019-01-30 ENCOUNTER — Ambulatory Visit
Admission: RE | Admit: 2019-01-30 | Discharge: 2019-01-30 | Disposition: A | Discharge status: Home / Self Care | Payer: Medicare HMO | Source: Ambulatory Visit | Attending: Internal Medicine | Admitting: Internal Medicine

## 2019-01-30 DIAGNOSIS — K760 Fatty (change of) liver, not elsewhere classified: Secondary | ICD-10-CM | POA: Diagnosis not present

## 2019-02-13 DIAGNOSIS — R3912 Poor urinary stream: Secondary | ICD-10-CM | POA: Diagnosis not present

## 2019-02-13 DIAGNOSIS — N401 Enlarged prostate with lower urinary tract symptoms: Secondary | ICD-10-CM | POA: Diagnosis not present

## 2019-02-22 DIAGNOSIS — R69 Illness, unspecified: Secondary | ICD-10-CM | POA: Diagnosis not present

## 2019-03-08 ENCOUNTER — Encounter: Payer: Self-pay | Admitting: Internal Medicine

## 2019-03-08 ENCOUNTER — Ambulatory Visit (INDEPENDENT_AMBULATORY_CARE_PROVIDER_SITE_OTHER): Payer: Medicare HMO | Admitting: Internal Medicine

## 2019-03-08 ENCOUNTER — Other Ambulatory Visit: Payer: Self-pay

## 2019-03-08 VITALS — BP 146/82 | HR 62 | Temp 97.7°F | Ht 74.0 in | Wt 202.0 lb

## 2019-03-08 DIAGNOSIS — Z0000 Encounter for general adult medical examination without abnormal findings: Secondary | ICD-10-CM

## 2019-03-08 DIAGNOSIS — I2583 Coronary atherosclerosis due to lipid rich plaque: Secondary | ICD-10-CM | POA: Diagnosis not present

## 2019-03-08 DIAGNOSIS — K760 Fatty (change of) liver, not elsewhere classified: Secondary | ICD-10-CM | POA: Diagnosis not present

## 2019-03-08 DIAGNOSIS — N320 Bladder-neck obstruction: Secondary | ICD-10-CM

## 2019-03-08 DIAGNOSIS — R739 Hyperglycemia, unspecified: Secondary | ICD-10-CM | POA: Diagnosis not present

## 2019-03-08 DIAGNOSIS — I2510 Atherosclerotic heart disease of native coronary artery without angina pectoris: Secondary | ICD-10-CM | POA: Diagnosis not present

## 2019-03-08 MED ORDER — VASCEPA 1 G PO CAPS: 2.0000 | ORAL_CAPSULE | Freq: Two times a day (BID) | ORAL | 11 refills | Status: DC

## 2019-03-08 NOTE — Assessment & Plan Note (Signed)
Statin intolerant Try Vascepa Consider K9 inj

## 2019-03-08 NOTE — Patient Instructions (Signed)
If you have medicare related insurance (such as traditional Medicare, Blue Cross Medicare, United HealthCare Medicare, or similar), Please make an appointment at the scheduling desk with Jill, the Wellness Health Coach, for your Wellness visit in this office, which is a benefit with your insurance.  

## 2019-03-08 NOTE — Assessment & Plan Note (Signed)
[employer] 8/20

## 2019-03-08 NOTE — Progress Notes (Signed)
Subjective:  Patient ID: Francis Ade., male    DOB: February 28, 1941  Age: 78 y.o. MRN: [id number]  CC: No chief complaint on file.   HPI Francis Sims. presents for DM Pt lost wt, GERD S/p UROLIFT    Outpatient Medications Prior to Visit  Medication Sig Dispense Refill  . aspirin EC 81 MG tablet Take 81 mg by mouth daily.    . cetirizine (ZYRTEC ALLERGY) 10 MG tablet Take 1 tablet (10 mg total) by mouth daily. 30 tablet 11  . cholecalciferol (VITAMIN D) 1000 UNITS tablet Take 1,000 Units by mouth daily.      . metFORMIN (GLUCOPHAGE) 500 MG tablet Take 1 tablet (500 mg total) by mouth 2 (two) times daily with a meal. 180 tablet 3  . olopatadine (PATANOL) 0.1 % ophthalmic solution Place 1 drop into both eyes 2 (two) times daily. 5 mL 1  . Omega-3 Fatty Acids (FISH OIL) 1000 MG CAPS Take 1 capsule by mouth 2 (two) times daily.      Marland Kitchen omeprazole (PRILOSEC) 40 MG capsule TAKE ONE CAPSULE BY MOUTH EVERY DAY AS NEEDED 90 capsule 3  . vardenafil (LEVITRA) 20 MG tablet Take 1 tablet (20 mg total) by mouth daily as needed for erectile dysfunction. 90 tablet 1  . doxazosin (CARDURA) 2 MG tablet Take 1 tablet (2 mg total) by mouth daily. 30 tablet 11  . finasteride (PROSCAR) 5 MG tablet TAKE 1 TABLET BY MOUTH EVERY DAY 90 tablet 3  . fluticasone (FLONASE) 50 MCG/ACT nasal spray Place 2 sprays into both nostrils daily. 16 g 6  . LORazepam (ATIVAN) 0.5 MG tablet Take 1 tablet (0.5 mg total) by mouth every 8 (eight) hours as needed for anxiety. 20 tablet 0  . solifenacin (VESICARE) 10 MG tablet Take 10 mg by mouth daily.     No facility-administered medications prior to visit.     ROS: Review of Systems  Constitutional: Positive for unexpected weight change. Negative for appetite change and fatigue.  HENT: Negative for congestion, nosebleeds, sneezing, sore throat and trouble swallowing.   Eyes: Negative for itching and visual disturbance.  Respiratory: Negative for cough.    Cardiovascular: Negative for chest pain, palpitations and leg swelling.  Gastrointestinal: Negative for abdominal distention, blood in stool, diarrhea and nausea.  Genitourinary: Negative for frequency and hematuria.  Musculoskeletal: Negative for back pain, gait problem, joint swelling and neck pain.  Skin: Negative for rash.  Neurological: Negative for dizziness, tremors, speech difficulty and weakness.  Psychiatric/Behavioral: Negative for agitation, dysphoric mood and sleep disturbance. The patient is not nervous/anxious.     Objective:  BP (!) 146/82 (BP Location: Left Arm, Patient Position: Sitting, Cuff Size: Large)   Pulse 62   Temp 97.7 F (36.5 C) (Oral)   Ht 6\' 2"  (1.88 m)   Wt 202 lb (91.6 kg)   SpO2 97%   BMI 25.94 kg/m   BP Readings from Last 3 Encounters:  03/08/19 (!) 146/82  01/04/19 (!) 150/80  11/23/18 (!) 144/78    Wt Readings from Last 3 Encounters:  03/08/19 202 lb (91.6 kg)  01/04/19 205 lb (93 kg)  11/23/18 205 lb (93 kg)    Physical Exam Constitutional:      General: He is not in acute distress.    Appearance: He is well-developed.     Comments: NAD  Eyes:     Conjunctiva/sclera: Conjunctivae normal.     Pupils: Pupils are equal, round, and reactive to light.  Neck:     Musculoskeletal: Normal range of motion.     Thyroid: No thyromegaly.     Vascular: No JVD.  Cardiovascular:     Rate and Rhythm: Normal rate and regular rhythm.     Heart sounds: Normal heart sounds. No murmur. No friction rub. No gallop.   Pulmonary:     Effort: Pulmonary effort is normal. No respiratory distress.     Breath sounds: Normal breath sounds. No wheezing or rales.  Chest:     Chest wall: No tenderness.  Abdominal:     General: Bowel sounds are normal. There is no distension.     Palpations: Abdomen is soft. There is no mass.     Tenderness: There is no abdominal tenderness. There is no guarding or rebound.  Musculoskeletal: Normal range of motion.         General: No tenderness.  Lymphadenopathy:     Cervical: No cervical adenopathy.  Skin:    General: Skin is warm and dry.     Findings: No rash.  Neurological:     Mental Status: He is alert and oriented to person, place, and time.     Cranial Nerves: No cranial nerve deficit.     Motor: No abnormal muscle tone.     Coordination: Coordination normal.     Gait: Gait normal.     Deep Tendon Reflexes: Reflexes are normal and symmetric.  Psychiatric:        Behavior: Behavior normal.        Thought Content: Thought content normal.        Judgment: Judgment normal.     Lab Results  Component Value Date   WBC 7.2 11/23/2018   HGB 15.6 11/23/2018   HCT 47.1 11/23/2018   PLT 208.0 11/23/2018   GLUCOSE 132 (H) 01/04/2019   CHOL 193 05/17/2018   TRIG 130.0 05/17/2018   HDL 31.80 (L) 05/17/2018   LDLDIRECT 157.3 05/08/2013   LDLCALC 135 (H) 05/17/2018   ALT 23 01/04/2019   AST 20 01/04/2019   NA 139 01/04/2019   K 4.9 01/04/2019   CL 102 01/04/2019   CREATININE 1.02 01/04/2019   BUN 23 01/04/2019   CO2 29 01/04/2019   TSH 2.18 11/23/2018   PSA 0.30 05/17/2018   HGBA1C 6.5 11/25/2018    US Abdomen Complete  Result Date: 01/30/2019 CLINICAL DATA:  Pruritis, weight loss EXAM: ABDOMEN ULTRASOUND COMPLETE COMPARISON:  CT 07/20/2017, ultrasound 08/28/2016 FINDINGS: Gallbladder: No gallstones or wall thickening visualized. No sonographic Murphy sign noted by sonographer. Common bile duct: Diameter: 3 mm Liver: No focal lesion identified. Diffusely echogenic liver with coarsened, inhomogeneous echotexture. Portal vein is patent on color Doppler imaging with normal direction of blood flow towards the liver. IVC: No abnormality visualized. Pancreas: Visualized portion unremarkable. Spleen: Size and appearance within normal limits. Right Kidney: Length: 12.4 cm. Echogenicity within normal limits. 8 mm cyst within the interpolar region. No solid mass or hydronephrosis visualized. Left Kidney:  Length: 11.8 cm. Echogenicity within normal limits. 1.8 cm simple cyst in the interpolar region. No solid mass or hydronephrosis visualized. Abdominal aorta: Proximal abdominal aorta measures 2.9 cm transverse. Mid abdominal aorta measures 2.5 cm transverse. Distal abdominal aorta measures 2.3 cm transverse. Other findings: None. IMPRESSION: 1. The echogenicity of the liver is increased. This is a nonspecific finding but is most commonly seen with fatty infiltration of the liver. There are no obvious focal liver lesions. 2. Ectatic abdominal aorta at risk for aneurysm development. Recommend  followup by ultrasound in 5 years. This recommendation follows ACR consensus guidelines: White Paper of the ACR Incidental Findings Committee II on Vascular Findings. J Am Coll Radiol 2013; 10:789-794. Electronically Signed   By: Davina Poke M.D.   On: 01/30/2019 09:33    Assessment & Plan:   There are no diagnoses linked to this encounter.   No orders of the defined types were placed in this encounter.    Follow-up: No follow-ups on file.  [name], MD

## 2019-03-08 NOTE — Assessment & Plan Note (Signed)
Labs

## 2019-03-08 NOTE — Assessment & Plan Note (Signed)
Metformin 

## 2019-03-17 DIAGNOSIS — H524 Presbyopia: Secondary | ICD-10-CM | POA: Diagnosis not present

## 2019-03-22 DIAGNOSIS — N400 Enlarged prostate without lower urinary tract symptoms: Secondary | ICD-10-CM | POA: Diagnosis not present

## 2019-03-22 DIAGNOSIS — H6123 Impacted cerumen, bilateral: Secondary | ICD-10-CM | POA: Diagnosis not present

## 2019-03-22 DIAGNOSIS — I10 Essential (primary) hypertension: Secondary | ICD-10-CM | POA: Diagnosis not present

## 2019-03-22 DIAGNOSIS — R3912 Poor urinary stream: Secondary | ICD-10-CM | POA: Diagnosis not present

## 2019-03-24 DIAGNOSIS — H16223 Keratoconjunctivitis sicca, not specified as Sjogren's, bilateral: Secondary | ICD-10-CM | POA: Diagnosis not present

## 2019-04-07 DIAGNOSIS — H18593 Other hereditary corneal dystrophies, bilateral: Secondary | ICD-10-CM | POA: Diagnosis not present

## 2019-04-29 ENCOUNTER — Other Ambulatory Visit: Payer: Self-pay | Admitting: Internal Medicine

## 2019-05-05 DIAGNOSIS — H18529 Epithelial (juvenile) corneal dystrophy, unspecified eye: Secondary | ICD-10-CM | POA: Diagnosis not present

## 2019-05-19 DIAGNOSIS — H18529 Epithelial (juvenile) corneal dystrophy, unspecified eye: Secondary | ICD-10-CM | POA: Diagnosis not present

## 2019-05-22 ENCOUNTER — Encounter: Payer: Self-pay | Admitting: Family

## 2019-05-22 ENCOUNTER — Other Ambulatory Visit: Payer: Self-pay | Admitting: Internal Medicine

## 2019-05-22 ENCOUNTER — Other Ambulatory Visit: Payer: Self-pay

## 2019-05-22 ENCOUNTER — Ambulatory Visit (INDEPENDENT_AMBULATORY_CARE_PROVIDER_SITE_OTHER): Payer: Medicare HMO | Admitting: Family

## 2019-05-22 VITALS — BP 152/82 | HR 85 | Temp 98.4°F | Ht 74.0 in | Wt 197.2 lb

## 2019-05-22 DIAGNOSIS — N401 Enlarged prostate with lower urinary tract symptoms: Secondary | ICD-10-CM | POA: Diagnosis not present

## 2019-05-22 DIAGNOSIS — F418 Other specified anxiety disorders: Secondary | ICD-10-CM

## 2019-05-22 DIAGNOSIS — R3915 Urgency of urination: Secondary | ICD-10-CM | POA: Diagnosis not present

## 2019-05-22 DIAGNOSIS — R69 Illness, unspecified: Secondary | ICD-10-CM | POA: Diagnosis not present

## 2019-05-22 MED ORDER — NITROFURANTOIN MONOHYD MACRO 100 MG PO CAPS: 100.0000 mg | ORAL_CAPSULE | Freq: Two times a day (BID) | ORAL | 0 refills | Status: DC

## 2019-05-22 MED ORDER — LORAZEPAM 0.5 MG PO TABS: 0.5000 mg | ORAL_TABLET | Freq: Two times a day (BID) | ORAL | 0 refills | Status: DC | PRN

## 2019-05-22 MED ORDER — LORAZEPAM 0.5 MG PO TABS: 0.5000 mg | ORAL_TABLET | Freq: Two times a day (BID) | ORAL | 1 refills | Status: DC | PRN
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
## 2019-05-25 ENCOUNTER — Telehealth: Payer: Self-pay

## 2019-05-25 ENCOUNTER — Ambulatory Visit (INDEPENDENT_AMBULATORY_CARE_PROVIDER_SITE_OTHER): Payer: Medicare HMO | Admitting: Internal Medicine

## 2019-05-25 ENCOUNTER — Other Ambulatory Visit: Payer: Medicare HMO

## 2019-05-25 ENCOUNTER — Encounter: Payer: Self-pay | Admitting: Internal Medicine

## 2019-05-25 ENCOUNTER — Other Ambulatory Visit: Payer: Self-pay

## 2019-05-25 VITALS — BP 160/84 | HR 76 | Temp 97.9°F | Ht 74.0 in | Wt 198.0 lb

## 2019-05-25 DIAGNOSIS — R399 Unspecified symptoms and signs involving the genitourinary system: Secondary | ICD-10-CM

## 2019-05-25 LAB — POCT URINALYSIS DIPSTICK
Blood, UA: NEGATIVE
Glucose, UA: NEGATIVE
Ketones, UA: NEGATIVE
Leukocytes, UA: NEGATIVE
Nitrite, UA: NEGATIVE
Protein, UA: POSITIVE — AB
Spec Grav, UA: 1.015 (ref 1.010–1.025)
Urobilinogen, UA: 1 E.U./dL
pH, UA: 6 (ref 5.0–8.0)

## 2019-05-25 MED ORDER — FOSFOMYCIN TROMETHAMINE 3 G PO PACK: 3.0000 g | PACK | Freq: Once | ORAL | 0 refills | Status: AC

## 2019-05-25 NOTE — Patient Instructions (Signed)
We have sent in fosfomycin to take the medicine which is a powder. You take this once and it works for 5-6 days.

## 2019-05-25 NOTE — Progress Notes (Signed)
   Subjective:   Patient ID: Francis Sims., male    DOB: [birth year], 78 y.o.   MRN: [id number]  HPI The patient is a 78 YO man coming in for concerns about urinary urgency and incontinence. Having urinary loss due to not being able to make it to the bathroom. Denies pain with urination. Denies fevers or chills. Denies abdominal pain. Seen 3 days ago and could not give sample. Started on macrobid to cover infection but this is not helping yet. Overall symptoms stable. His wife is getting ovarian cancer treatment starting day after visit and wants to see if he can check this out. Brought sample of urine to be tested as given sample cup at visit. Denies pain in back or stomach. Denies nausea. Some reduction in appetite. Has not slept much due to being up all night going back and forth to bathroom. Prior urolift for obstruction and not being able to urinate.   Review of Systems  Constitutional: Negative.   HENT: Negative.   Eyes: Negative.   Respiratory: Negative for cough, chest tightness and shortness of breath.   Cardiovascular: Negative for chest pain, palpitations and leg swelling.  Gastrointestinal: Negative for abdominal distention, abdominal pain, constipation, diarrhea, nausea and vomiting.  Genitourinary: Positive for frequency and urgency.  Musculoskeletal: Negative.   Skin: Negative.   Neurological: Negative.   Psychiatric/Behavioral: Negative.     Objective:  Physical Exam Constitutional:      Appearance: He is well-developed.  HENT:     Head: Normocephalic and atraumatic.  Cardiovascular:     Rate and Rhythm: Normal rate and regular rhythm.  Pulmonary:     Effort: Pulmonary effort is normal. No respiratory distress.     Breath sounds: Normal breath sounds. No wheezing or rales.  Abdominal:     General: Bowel sounds are normal. There is no distension.     Palpations: Abdomen is soft.     Tenderness: There is no abdominal tenderness. There is no rebound.    Musculoskeletal:     Cervical back: Normal range of motion.  Skin:    General: Skin is warm and dry.  Neurological:     Mental Status: He is alert and oriented to person, place, and time.     Coordination: Coordination normal.     Vitals:   05/25/19 1450  BP: (!) 160/84  Pulse: 76  Temp: 97.9 F (36.6 C)  TempSrc: Oral  SpO2: 97%  Weight: 198 lb (89.8 kg)  Height: 6\' 2"  (1.88 m)    This visit occurred during the SARS-CoV-2 public health emergency.  Safety protocols were in place, including screening questions prior to the visit, additional usage of staff PPE, and extensive cleaning of exam room while observing appropriate contact time as indicated for disinfecting solutions.   Assessment & Plan:

## 2019-05-25 NOTE — Telephone Encounter (Signed)
Copied from [place] [id number]. Topic: General - Other >> May 25, 2019  8:51 AM Rainey Pines A wrote: Patient called to inform that he hasnt gotten better inc  appointment 3 days ago and was advised to callback and let office know. Patient wanted to book another appointment. Please advise

## 2019-05-26 DIAGNOSIS — R399 Unspecified symptoms and signs involving the genitourinary system: Secondary | ICD-10-CM | POA: Insufficient documentation

## 2019-05-26 NOTE — Assessment & Plan Note (Signed)
U/A done in office consistent with some dehydration but not infection. Could be partially treated with macrobid and will send for culture. Rx fosfomycin to treat if UTI and stop macrobid. If no improvement can try flomax.

## 2019-05-27 LAB — URINE CULTURE
MICRO NUMBER:: 1184935
SPECIMEN QUALITY:: ADEQUATE

## 2019-05-31 ENCOUNTER — Other Ambulatory Visit (INDEPENDENT_AMBULATORY_CARE_PROVIDER_SITE_OTHER): Payer: Medicare HMO

## 2019-05-31 DIAGNOSIS — Z125 Encounter for screening for malignant neoplasm of prostate: Secondary | ICD-10-CM | POA: Diagnosis not present

## 2019-05-31 DIAGNOSIS — R739 Hyperglycemia, unspecified: Secondary | ICD-10-CM | POA: Diagnosis not present

## 2019-05-31 DIAGNOSIS — I2583 Coronary atherosclerosis due to lipid rich plaque: Secondary | ICD-10-CM

## 2019-05-31 DIAGNOSIS — I2510 Atherosclerotic heart disease of native coronary artery without angina pectoris: Secondary | ICD-10-CM | POA: Diagnosis not present

## 2019-05-31 DIAGNOSIS — Z0000 Encounter for general adult medical examination without abnormal findings: Secondary | ICD-10-CM

## 2019-05-31 LAB — CBC WITH DIFFERENTIAL/PLATELET
Basophils Absolute: 0 10*3/uL (ref 0.0–0.1)
Basophils Relative: 0.3 % (ref 0.0–3.0)
Eosinophils Absolute: 0.1 10*3/uL (ref 0.0–0.7)
Eosinophils Relative: 0.5 % (ref 0.0–5.0)
HCT: 45.9 % (ref 39.0–52.0)
Hemoglobin: 15.3 g/dL (ref 13.0–17.0)
Lymphocytes Relative: 19.9 % (ref 12.0–46.0)
Lymphs Abs: 2 10*3/uL (ref 0.7–4.0)
MCHC: 33.3 g/dL (ref 30.0–36.0)
MCV: 84 fl (ref 78.0–100.0)
Monocytes Absolute: 1 10*3/uL (ref 0.1–1.0)
Monocytes Relative: 10.1 % (ref 3.0–12.0)
Neutro Abs: 7 10*3/uL (ref 1.4–7.7)
Neutrophils Relative %: 69.2 % (ref 43.0–77.0)
Platelets: 420 10*3/uL — ABNORMAL HIGH (ref 150.0–400.0)
RBC: 5.46 Mil/uL (ref 4.22–5.81)
RDW: 14.2 % (ref 11.5–15.5)
WBC: 10.2 10*3/uL (ref 4.0–10.5)

## 2019-05-31 LAB — BASIC METABOLIC PANEL
BUN: 24 mg/dL — ABNORMAL HIGH (ref 6–23)
CO2: 29 mEq/L (ref 19–32)
Calcium: 9.6 mg/dL (ref 8.4–10.5)
Chloride: 101 mEq/L (ref 96–112)
Creatinine, Ser: 0.88 mg/dL (ref 0.40–1.50)
GFR: 84.11 mL/min (ref >60.00)
Glucose, Bld: 118 mg/dL — ABNORMAL HIGH (ref 70–99)
Potassium: 4.4 mEq/L (ref 3.5–5.1)
Sodium: 137 mEq/L (ref 135–145)

## 2019-05-31 LAB — URINALYSIS
Bilirubin Urine: NEGATIVE
Hgb urine dipstick: NEGATIVE
Ketones, ur: NEGATIVE
Leukocytes,Ua: NEGATIVE
Nitrite: NEGATIVE
Specific Gravity, Urine: 1.025 (ref 1.000–1.030)
Total Protein, Urine: NEGATIVE
Urine Glucose: NEGATIVE
Urobilinogen, UA: 0.2 (ref 0.0–1.0)
pH: 6 (ref 5.0–8.0)

## 2019-05-31 LAB — LIPID PANEL
Cholesterol: 164 mg/dL (ref 0–200)
HDL: 32.4 mg/dL — ABNORMAL LOW (ref >39.00)
NonHDL: 131.15
Total CHOL/HDL Ratio: 5
Triglycerides: 207 mg/dL — ABNORMAL HIGH (ref 0.0–149.0)
VLDL: 41.4 mg/dL — ABNORMAL HIGH (ref 0.0–40.0)

## 2019-05-31 LAB — HEPATIC FUNCTION PANEL
ALT: 44 U/L (ref 0–53)
AST: 31 U/L (ref 0–37)
Albumin: 4.2 g/dL (ref 3.5–5.2)
Alkaline Phosphatase: 71 U/L (ref 39–117)
Bilirubin, Direct: 0.2 mg/dL (ref 0.0–0.3)
Total Bilirubin: 0.9 mg/dL (ref 0.2–1.2)
Total Protein: 7.3 g/dL (ref 6.0–8.3)

## 2019-05-31 LAB — TSH: TSH: 1.21 u[IU]/mL (ref 0.35–4.50)

## 2019-05-31 LAB — PSA: PSA: 0.3 ng/mL (ref 0.10–4.00)

## 2019-05-31 LAB — LDL CHOLESTEROL, DIRECT: Direct LDL: 105 mg/dL

## 2019-05-31 LAB — HEMOGLOBIN A1C: Hgb A1c MFr Bld: 6.3 % (ref 4.6–6.5)

## 2019-06-06 ENCOUNTER — Ambulatory Visit: Payer: Medicare HMO | Admitting: Internal Medicine

## 2019-06-07 ENCOUNTER — Ambulatory Visit: Payer: Medicare HMO | Admitting: Internal Medicine

## 2019-06-15 ENCOUNTER — Encounter: Payer: Self-pay | Admitting: Internal Medicine

## 2019-06-15 ENCOUNTER — Other Ambulatory Visit: Payer: Self-pay

## 2019-06-15 ENCOUNTER — Ambulatory Visit (INDEPENDENT_AMBULATORY_CARE_PROVIDER_SITE_OTHER): Payer: Medicare HMO | Admitting: Internal Medicine

## 2019-06-15 DIAGNOSIS — R1032 Left lower quadrant pain: Secondary | ICD-10-CM

## 2019-06-15 DIAGNOSIS — N390 Urinary tract infection, site not specified: Secondary | ICD-10-CM

## 2019-06-15 DIAGNOSIS — R634 Abnormal weight loss: Secondary | ICD-10-CM | POA: Diagnosis not present

## 2019-06-15 DIAGNOSIS — R32 Unspecified urinary incontinence: Secondary | ICD-10-CM

## 2019-06-15 MED ORDER — LEVOFLOXACIN 500 MG PO TABS: 500.0000 mg | ORAL_TABLET | Freq: Every day | ORAL | 0 refills | Status: DC

## 2019-06-15 MED ORDER — TAMSULOSIN HCL 0.4 MG PO CAPS: 0.4000 mg | ORAL_CAPSULE | Freq: Every day | ORAL | 5 refills | Status: DC

## 2019-06-15 MED ORDER — NORTRIPTYLINE HCL 10 MG PO CAPS: 10.0000 mg | ORAL_CAPSULE | Freq: Every day | ORAL | 5 refills | Status: DC

## 2019-06-15 MED ORDER — LORAZEPAM 0.5 MG PO TABS: 0.5000 mg | ORAL_TABLET | Freq: Two times a day (BID) | ORAL | 1 refills | Status: DC | PRN

## 2019-06-15 NOTE — Assessment & Plan Note (Signed)
Pamelor at hs Treat UTI, depression

## 2019-06-15 NOTE — Assessment & Plan Note (Signed)
abd  CT 2019

## 2019-06-15 NOTE — Assessment & Plan Note (Addendum)
Pamelor at hs Flomax Treat UTI

## 2019-06-15 NOTE — Assessment & Plan Note (Signed)
(-)   treated w/Macrobid and [employer]

## 2019-06-15 NOTE — Progress Notes (Signed)
Subjective:  Patient ID: Francis Sims., male    DOB: 03-31-1941  Age: 78 y.o. MRN: [id number]  CC: No chief complaint on file.   HPI Ye Rocchi. presents for urinary issues, LLQ abd pain, wt loss, stress C/o ticking in the R ear - weeks ?UTI C/o wt loss and not eating; weak   Outpatient Medications Prior to Visit  Medication Sig Dispense Refill  . aspirin EC 81 MG tablet Take 81 mg by mouth daily.    . cetirizine (ZYRTEC ALLERGY) 10 MG tablet Take 1 tablet (10 mg total) by mouth daily. 30 tablet 11  . cholecalciferol (VITAMIN D) 1000 UNITS tablet Take 1,000 Units by mouth daily.      Vanessa Kick Ethyl (VASCEPA) 1 g CAPS Take 2 capsules (2 g total) by mouth 2 (two) times daily. 120 capsule 11  . LORazepam (ATIVAN) 0.5 MG tablet Take 1 tablet (0.5 mg total) by mouth 2 (two) times daily as needed for anxiety. 30 tablet 1  . olopatadine (PATANOL) 0.1 % ophthalmic solution Place 1 drop into both eyes 2 (two) times daily. 5 mL 1  . Omega-3 Fatty Acids (FISH OIL) 1000 MG CAPS Take 1 capsule by mouth 2 (two) times daily.      Marland Kitchen omeprazole (PRILOSEC) 40 MG capsule TAKE ONE CAPSULE BY MOUTH EVERY DAY AS NEEDED 90 capsule 3  . vardenafil (LEVITRA) 20 MG tablet Take 1 tablet (20 mg total) by mouth daily as needed for erectile dysfunction. 90 tablet 1  . metFORMIN (GLUCOPHAGE) 500 MG tablet Take 1 tablet (500 mg total) by mouth 2 (two) times daily with a meal. 180 tablet 3  . nitrofurantoin, macrocrystal-monohydrate, (MACROBID) 100 MG capsule Take 1 capsule (100 mg total) by mouth 2 (two) times daily. 14 capsule 0   No facility-administered medications prior to visit.    ROS: Review of Systems  Constitutional: Positive for fatigue and unexpected weight change. Negative for appetite change.  HENT: Negative for congestion, nosebleeds, sneezing, sore throat and trouble swallowing.   Eyes: Negative for itching and visual disturbance.  Respiratory: Negative for cough.     Cardiovascular: Negative for chest pain, palpitations and leg swelling.  Gastrointestinal: Negative for abdominal distention, blood in stool, diarrhea and nausea.  Genitourinary: Positive for frequency and urgency. Negative for decreased urine volume, difficulty urinating, discharge, hematuria, penile pain, penile swelling and scrotal swelling.  Musculoskeletal: Positive for arthralgias and back pain. Negative for gait problem, joint swelling and neck pain.  Skin: Negative for rash.  Neurological: Positive for weakness. Negative for dizziness, tremors and speech difficulty.  Psychiatric/Behavioral: Positive for decreased concentration, dysphoric mood and sleep disturbance. Negative for agitation, confusion and suicidal ideas. The patient is nervous/anxious.     Objective:  BP (!) 154/84 (BP Location: Left Arm, Patient Position: Sitting, Cuff Size: Normal)   Pulse 73   Temp 98 F (36.7 C) (Oral)   Ht 6\' 2"  (1.88 m)   Wt 193 lb 12.8 oz (87.9 kg)   SpO2 95%   BMI 24.88 kg/m   BP Readings from Last 3 Encounters:  06/15/19 (!) 154/84  05/25/19 (!) 160/84  05/22/19 (!) 152/82    Wt Readings from Last 3 Encounters:  06/15/19 193 lb 12.8 oz (87.9 kg)  05/25/19 198 lb (89.8 kg)  05/22/19 197 lb 3.2 oz (89.4 kg)    Physical Exam Constitutional:      General: He is not in acute distress.    Appearance: He is well-developed  and normal weight. He is not ill-appearing or diaphoretic.     Comments: NAD  Eyes:     Conjunctiva/sclera: Conjunctivae normal.     Pupils: Pupils are equal, round, and reactive to light.  Neck:     Thyroid: No thyromegaly.     Vascular: No JVD.  Cardiovascular:     Rate and Rhythm: Normal rate and regular rhythm.     Heart sounds: Normal heart sounds. No murmur. No friction rub. No gallop.   Pulmonary:     Effort: Pulmonary effort is normal. No respiratory distress.     Breath sounds: Normal breath sounds. No wheezing or rales.  Chest:     Chest wall: No  tenderness.  Abdominal:     General: Bowel sounds are normal. There is no distension.     Palpations: Abdomen is soft. There is no mass.     Tenderness: There is no abdominal tenderness. There is no guarding or rebound.  Genitourinary:    Prostate: Normal.     Rectum: Normal. Guaiac result negative.  Musculoskeletal:        General: No tenderness. Normal range of motion.     Cervical back: Normal range of motion.  Lymphadenopathy:     Cervical: No cervical adenopathy.  Skin:    General: Skin is warm and dry.     Findings: No rash.  Neurological:     Mental Status: He is alert and oriented to person, place, and time.     Cranial Nerves: No cranial nerve deficit.     Motor: Weakness present. No abnormal muscle tone.     Coordination: Coordination normal.     Gait: Gait abnormal.     Deep Tendon Reflexes: Reflexes are normal and symmetric.  Psychiatric:        Behavior: Behavior normal.        Thought Content: Thought content normal.        Judgment: Judgment normal.   sad  Lab Results  Component Value Date   WBC 10.2 05/31/2019   HGB 15.3 05/31/2019   HCT 45.9 05/31/2019   PLT 420.0 (H) 05/31/2019   GLUCOSE 118 (H) 05/31/2019   CHOL 164 05/31/2019   TRIG 207.0 (H) 05/31/2019   HDL 32.40 (L) 05/31/2019   LDLDIRECT 105.0 05/31/2019   LDLCALC 135 (H) 05/17/2018   ALT 44 05/31/2019   AST 31 05/31/2019   NA 137 05/31/2019   K 4.4 05/31/2019   CL 101 05/31/2019   CREATININE 0.88 05/31/2019   BUN 24 (H) 05/31/2019   CO2 29 05/31/2019   TSH 1.21 05/31/2019   PSA 0.30 05/31/2019   HGBA1C 6.3 05/31/2019    US Abdomen Complete  Result Date: 01/30/2019 CLINICAL DATA:  Pruritis, weight loss EXAM: ABDOMEN ULTRASOUND COMPLETE COMPARISON:  CT 07/20/2017, ultrasound 08/28/2016 FINDINGS: Gallbladder: No gallstones or wall thickening visualized. No sonographic Murphy sign noted by sonographer. Common bile duct: Diameter: 3 mm Liver: No focal lesion identified. Diffusely echogenic  liver with coarsened, inhomogeneous echotexture. Portal vein is patent on color Doppler imaging with normal direction of blood flow towards the liver. IVC: No abnormality visualized. Pancreas: Visualized portion unremarkable. Spleen: Size and appearance within normal limits. Right Kidney: Length: 12.4 cm. Echogenicity within normal limits. 8 mm cyst within the interpolar region. No solid mass or hydronephrosis visualized. Left Kidney: Length: 11.8 cm. Echogenicity within normal limits. 1.8 cm simple cyst in the interpolar region. No solid mass or hydronephrosis visualized. Abdominal aorta: Proximal abdominal aorta measures 2.9 cm  transverse. Mid abdominal aorta measures 2.5 cm transverse. Distal abdominal aorta measures 2.3 cm transverse. Other findings: None. IMPRESSION: 1. The echogenicity of the liver is increased. This is a nonspecific finding but is most commonly seen with fatty infiltration of the liver. There are no obvious focal liver lesions. 2. Ectatic abdominal aorta at risk for aneurysm development. Recommend followup by ultrasound in 5 years. This recommendation follows ACR consensus guidelines: White Paper of the ACR Incidental Findings Committee II on Vascular Findings. J Am Coll Radiol 2013; 10:789-794. Electronically Signed   By: Davina Poke M.D.   On: 01/30/2019 09:33    Assessment & Plan:   Diagnoses and all orders for this visit:  Urinary tract infection without hematuria, site unspecified  Urinary incontinence, unspecified type     No orders of the defined types were placed in this encounter.    Follow-up: No follow-ups on file.  [name], MD

## 2019-06-18 ENCOUNTER — Encounter: Payer: Self-pay | Admitting: Internal Medicine

## 2019-06-22 ENCOUNTER — Telehealth: Payer: Self-pay | Admitting: Internal Medicine

## 2019-06-22 NOTE — Telephone Encounter (Signed)
Patient would like to know if he should get covid vaccine through [facility]. Specifically if it will interfere with any medications he is currently on.

## 2019-06-23 NOTE — Telephone Encounter (Signed)
Go ahead and get vaccinated.  Thanks

## 2019-06-23 NOTE — Telephone Encounter (Signed)
Patient advised ok to get vaccine per dr Alain Marion

## 2019-06-29 ENCOUNTER — Other Ambulatory Visit: Payer: Self-pay

## 2019-06-29 ENCOUNTER — Encounter: Payer: Self-pay | Admitting: Internal Medicine

## 2019-06-29 ENCOUNTER — Ambulatory Visit (INDEPENDENT_AMBULATORY_CARE_PROVIDER_SITE_OTHER): Payer: Medicare HMO | Admitting: Internal Medicine

## 2019-06-29 DIAGNOSIS — N390 Urinary tract infection, site not specified: Secondary | ICD-10-CM | POA: Diagnosis not present

## 2019-06-29 DIAGNOSIS — R69 Illness, unspecified: Secondary | ICD-10-CM | POA: Diagnosis not present

## 2019-06-29 DIAGNOSIS — N320 Bladder-neck obstruction: Secondary | ICD-10-CM | POA: Diagnosis not present

## 2019-06-29 DIAGNOSIS — R634 Abnormal weight loss: Secondary | ICD-10-CM | POA: Diagnosis not present

## 2019-06-29 DIAGNOSIS — I10 Essential (primary) hypertension: Secondary | ICD-10-CM | POA: Diagnosis not present

## 2019-06-29 DIAGNOSIS — F4323 Adjustment disorder with mixed anxiety and depressed mood: Secondary | ICD-10-CM

## 2019-06-29 MED ORDER — AMLODIPINE BESYLATE 5 MG PO TABS: 5.0000 mg | ORAL_TABLET | Freq: Every day | ORAL | 3 refills | Status: DC

## 2019-06-29 NOTE — Patient Instructions (Signed)
You can pre-medicate yourself with Benadryl 25 mg and Tylenol 650 mg 1 hour prior to the vaccination.     We are committed to keeping you informed about the COVID-19 vaccine.  As the vaccine continues to become available for each phase, we will ensure that patients who meet the criteria receive the information they need to access vaccination opportunities. Continue to check your MyChart account and [email or web address] for updates. Please review the Phase 1b information below.  Following [place]'s guidelines for the distribution of COVID-19 vaccines, we are pleased to share our plans to begin offering vaccines to those 75 and older (Phase 1b). Here are details of those plans:  [place] On Tuesday, Jan. 19, the [facility]) and [place] begin large-scale COVID-19 vaccinations at the [place]. The vaccinations are appointment only and for those 24 and older.  It is expected that 750 will initially be vaccinated per day [facility]. Capacity is expected to grow in the weeks ahead. However, the number of reservations accepted depends on the amount of vaccine available.  Online or Phone Registration Only Walk-ins will NOT be accepted. Registration will open on Friday, January 15.   Health Department Registration [facility] residents only)  [place] (Any local residents)  Other Counties We are also working in partnership with county health agencies in [place], [place] and [place] counties to ensure continuing vaccination availability in alignment with state guidelines in the weeks and months ahead. Information on phase 1b COVID-19 vaccination clinics being offered by local county health agencies is provided in the website links below for your convenience:  [place]'s phase 1b  vaccination guidelines, prioritizing those 75 and over as the next eligible group to receive the COVID-19 vaccine, are detailed at [email or web address].   Vaccine Safety and Effectiveness Clinical trials for the Pfizer COVID-19 vaccine involved 42,000 people and showed that the vaccine is more than 95% effective in preventing COVID-19 with no serious safety concerns. Similar results have been reported for the Moderna COVID-19 vaccine. Side effects reported in the [place] clinical trials include a sore arm at the injection site, fatigue, headache, chills and fever. While side effects from the [place] COVID-19 vaccine are higher than for a typical flu vaccine, they are lower in many ways than side effects from the leading vaccine to prevent shingles. Side effects are signs that a vaccine is working and are related to your immune system being stimulated to produce antibodies against infection. Side effects from vaccination are far less significant than health impacts from COVID-19.  Staying Informed Pharmacists, infectious disease doctors, critical care nurses and other experts at [facility] continue to speak publicly through media interviews and direct communication with our patients and communities about the safety, effectiveness and importance of vaccines to eliminate COVID-19. In addition, reliable information on vaccine safety, effectiveness, side effects and more is available on the following websites:  [place] Department of Health and Human Services COVID-19 Vaccine Information Website.  U.S. Centers for Disease Control and Prevention [id number] Human resources officer.  Staying Safe We agree with the CDC on what we can do to help our communities get back to normal: Getting "back to normal" is going to take all of our tools. If we use all the tools we have, we stand the best chance of getting our families, communities, schools and workplaces "back to normal"  sooner:  Get vaccinated as  soon as vaccines become available within the phase of the state's vaccination rollout plan for which you meet the eligibility criteria.  Wear a mask.  Stay 6 feet from others and avoid crowds.  Wash hands often.  For our most current information, please visit [email or web address].

## 2019-06-29 NOTE — Progress Notes (Signed)
Subjective:  Patient ID: Francis Ade., male    DOB: [birth year]  Age: 78 y.o. MRN: [id number]  CC: No chief complaint on file.   HPI Francis Sims. presents for wt loss, UTI, elevated BP f/u. Feeling better. Sleeping 6 h/night. Will play tennis today  Outpatient Medications Prior to Visit  Medication Sig Dispense Refill  . aspirin EC 81 MG tablet Take 81 mg by mouth daily.    . cetirizine (ZYRTEC ALLERGY) 10 MG tablet Take 1 tablet (10 mg total) by mouth daily. 30 tablet 11  . cholecalciferol (VITAMIN D) 1000 UNITS tablet Take 1,000 Units by mouth daily.      Vanessa Kick Ethyl (VASCEPA) 1 g CAPS Take 2 capsules (2 g total) by mouth 2 (two) times daily. 120 capsule 11  . levofloxacin (LEVAQUIN) 500 MG tablet Take 1 tablet (500 mg total) by mouth daily. 30 tablet 0  . LORazepam (ATIVAN) 0.5 MG tablet Take 1 tablet (0.5 mg total) by mouth 2 (two) times daily as needed for anxiety. 30 tablet 1  . nortriptyline (PAMELOR) 10 MG capsule Take 1-2 capsules (10-20 mg total) by mouth at bedtime. 60 capsule 5  . olopatadine (PATANOL) 0.1 % ophthalmic solution Place 1 drop into both eyes 2 (two) times daily. 5 mL 1  . Omega-3 Fatty Acids (FISH OIL) 1000 MG CAPS Take 1 capsule by mouth 2 (two) times daily.      Marland Kitchen omeprazole (PRILOSEC) 40 MG capsule TAKE ONE CAPSULE BY MOUTH EVERY DAY AS NEEDED 90 capsule 3  . tamsulosin (FLOMAX) 0.4 MG CAPS capsule Take 1 capsule (0.4 mg total) by mouth daily. 30 capsule 5  . vardenafil (LEVITRA) 20 MG tablet Take 1 tablet (20 mg total) by mouth daily as needed for erectile dysfunction. 90 tablet 1   No facility-administered medications prior to visit.    ROS: Review of Systems  Constitutional: Negative for appetite change, fatigue and unexpected weight change.  HENT: Negative for congestion, nosebleeds, sneezing, sore throat and trouble swallowing.   Eyes: Negative for itching and visual disturbance.  Respiratory: Negative for cough.     Cardiovascular: Negative for chest pain, palpitations and leg swelling.  Gastrointestinal: Negative for abdominal distention, blood in stool, diarrhea and nausea.  Genitourinary: Negative for frequency and hematuria.  Musculoskeletal: Positive for arthralgias and back pain. Negative for gait problem, joint swelling and neck pain.  Skin: Negative for rash.  Neurological: Negative for dizziness, tremors, speech difficulty and weakness.  Psychiatric/Behavioral: Positive for sleep disturbance. Negative for agitation, dysphoric mood and suicidal ideas. The patient is not nervous/anxious.     Objective:  BP (!) 166/92 (BP Location: Left Arm, Patient Position: Sitting, Cuff Size: Normal)   Pulse 87   Temp 98.4 F (36.9 C) (Oral)   Ht 6\' 2"  (1.88 m)   Wt 195 lb (88.5 kg)   SpO2 96%   BMI 25.04 kg/m   BP Readings from Last 3 Encounters:  06/29/19 (!) 166/92  06/15/19 (!) 154/84  05/25/19 (!) 160/84    Wt Readings from Last 3 Encounters:  06/29/19 195 lb (88.5 kg)  06/15/19 193 lb 12.8 oz (87.9 kg)  05/25/19 198 lb (89.8 kg)    Physical Exam Constitutional:      General: He is not in acute distress.    Appearance: He is well-developed.     Comments: NAD  Eyes:     Conjunctiva/sclera: Conjunctivae normal.     Pupils: Pupils are equal, round, and reactive  to light.  Neck:     Thyroid: No thyromegaly.     Vascular: No JVD.  Cardiovascular:     Rate and Rhythm: Normal rate and regular rhythm.     Heart sounds: Normal heart sounds. No murmur. No friction rub. No gallop.   Pulmonary:     Effort: Pulmonary effort is normal. No respiratory distress.     Breath sounds: Normal breath sounds. No wheezing or rales.  Chest:     Chest wall: No tenderness.  Abdominal:     General: Bowel sounds are normal. There is no distension.     Palpations: Abdomen is soft. There is no mass.     Tenderness: There is no abdominal tenderness. There is no guarding or rebound.  Musculoskeletal:         General: No tenderness. Normal range of motion.     Cervical back: Normal range of motion.  Lymphadenopathy:     Cervical: No cervical adenopathy.  Skin:    General: Skin is warm and dry.     Findings: No rash.  Neurological:     Mental Status: He is alert and oriented to person, place, and time.     Cranial Nerves: No cranial nerve deficit.     Motor: No abnormal muscle tone.     Coordination: Coordination normal.     Gait: Gait normal.     Deep Tendon Reflexes: Reflexes are normal and symmetric.  Psychiatric:        Behavior: Behavior normal.        Thought Content: Thought content normal.        Judgment: Judgment normal.     Lab Results  Component Value Date   WBC 10.2 05/31/2019   HGB 15.3 05/31/2019   HCT 45.9 05/31/2019   PLT 420.0 (H) 05/31/2019   GLUCOSE 118 (H) 05/31/2019   CHOL 164 05/31/2019   TRIG 207.0 (H) 05/31/2019   HDL 32.40 (L) 05/31/2019   LDLDIRECT 105.0 05/31/2019   LDLCALC 135 (H) 05/17/2018   ALT 44 05/31/2019   AST 31 05/31/2019   NA 137 05/31/2019   K 4.4 05/31/2019   CL 101 05/31/2019   CREATININE 0.88 05/31/2019   BUN 24 (H) 05/31/2019   CO2 29 05/31/2019   TSH 1.21 05/31/2019   PSA 0.30 05/31/2019   HGBA1C 6.3 05/31/2019    US Abdomen Complete  Result Date: 01/30/2019 CLINICAL DATA:  Pruritis, weight loss EXAM: ABDOMEN ULTRASOUND COMPLETE COMPARISON:  CT 07/20/2017, ultrasound 08/28/2016 FINDINGS: Gallbladder: No gallstones or wall thickening visualized. No sonographic Murphy sign noted by sonographer. Common bile duct: Diameter: 3 mm Liver: No focal lesion identified. Diffusely echogenic liver with coarsened, inhomogeneous echotexture. Portal vein is patent on color Doppler imaging with normal direction of blood flow towards the liver. IVC: No abnormality visualized. Pancreas: Visualized portion unremarkable. Spleen: Size and appearance within normal limits. Right Kidney: Length: 12.4 cm. Echogenicity within normal limits. 8 mm cyst  within the interpolar region. No solid mass or hydronephrosis visualized. Left Kidney: Length: 11.8 cm. Echogenicity within normal limits. 1.8 cm simple cyst in the interpolar region. No solid mass or hydronephrosis visualized. Abdominal aorta: Proximal abdominal aorta measures 2.9 cm transverse. Mid abdominal aorta measures 2.5 cm transverse. Distal abdominal aorta measures 2.3 cm transverse. Other findings: None. IMPRESSION: 1. The echogenicity of the liver is increased. This is a nonspecific finding but is most commonly seen with fatty infiltration of the liver. There are no obvious focal liver lesions. 2. Ectatic abdominal  aorta at risk for aneurysm development. Recommend followup by ultrasound in 5 years. This recommendation follows ACR consensus guidelines: White Paper of the ACR Incidental Findings Committee II on Vascular Findings. J Am Coll Radiol 2013; 10:789-794. Electronically Signed   By: Davina Poke M.D.   On: 01/30/2019 09:33    Assessment & Plan:   There are no diagnoses linked to this encounter.   No orders of the defined types were placed in this encounter.    Follow-up: No follow-ups on file.  [name], MD

## 2019-07-03 ENCOUNTER — Encounter: Payer: Self-pay | Admitting: Internal Medicine

## 2019-07-03 DIAGNOSIS — F4323 Adjustment disorder with mixed anxiety and depressed mood: Secondary | ICD-10-CM | POA: Insufficient documentation

## 2019-07-03 NOTE — Assessment & Plan Note (Signed)
Discussed.  Lorazepam as needed  Potential benefits of a long term benzodiazepines  use as well as potential risks  and complications were explained to the patient and were aknowledged.

## 2019-07-03 NOTE — Assessment & Plan Note (Signed)
Continue with Flomax

## 2019-07-03 NOTE — Assessment & Plan Note (Signed)
Wt Readings from Last 3 Encounters:  06/29/19 195 lb (88.5 kg)  06/15/19 193 lb 12.8 oz (87.9 kg)  05/25/19 198 lb (89.8 kg)  Better appetite

## 2019-07-03 NOTE — Assessment & Plan Note (Signed)
BP Readings from Last 3 Encounters:  06/29/19 (!) 166/92  06/15/19 (!) 154/84  05/25/19 (!) 160/84   Worse.  Start amlodipine

## 2019-07-03 NOTE — Assessment & Plan Note (Signed)
On Levaquin.  Better

## 2019-09-20 DIAGNOSIS — R69 Illness, unspecified: Secondary | ICD-10-CM | POA: Diagnosis not present

## 2019-09-21 DIAGNOSIS — R69 Illness, unspecified: Secondary | ICD-10-CM | POA: Diagnosis not present

## 2019-09-27 ENCOUNTER — Ambulatory Visit (INDEPENDENT_AMBULATORY_CARE_PROVIDER_SITE_OTHER): Payer: Medicare HMO | Admitting: Internal Medicine

## 2019-09-27 ENCOUNTER — Other Ambulatory Visit: Payer: Self-pay

## 2019-09-27 ENCOUNTER — Encounter: Payer: Self-pay | Admitting: Internal Medicine

## 2019-09-27 DIAGNOSIS — F4323 Adjustment disorder with mixed anxiety and depressed mood: Secondary | ICD-10-CM | POA: Diagnosis not present

## 2019-09-27 DIAGNOSIS — N320 Bladder-neck obstruction: Secondary | ICD-10-CM

## 2019-09-27 DIAGNOSIS — R69 Illness, unspecified: Secondary | ICD-10-CM | POA: Diagnosis not present

## 2019-09-27 DIAGNOSIS — R030 Elevated blood-pressure reading, without diagnosis of hypertension: Secondary | ICD-10-CM | POA: Diagnosis not present

## 2019-09-27 NOTE — Assessment & Plan Note (Signed)
F/u w/Dr Vernie Shanks On Flomax

## 2019-09-27 NOTE — Assessment & Plan Note (Signed)
  BP nl at home 

## 2019-09-27 NOTE — Progress Notes (Signed)
Subjective:  Patient ID: Francis Ade., male    DOB: [birth year]  Age: 79 y.o. MRN: [id number]  CC: No chief complaint on file.   HPI Francis Dovell. presents for stress, HTN, situational depression, BPH f/u BP nl at home  Outpatient Medications Prior to Visit  Medication Sig Dispense Refill  . aspirin EC 81 MG tablet Take 81 mg by mouth daily.    . cetirizine (ZYRTEC ALLERGY) 10 MG tablet Take 1 tablet (10 mg total) by mouth daily. 30 tablet 11  . cholecalciferol (VITAMIN D) 1000 UNITS tablet Take 1,000 Units by mouth daily.      Marland Kitchen LORazepam (ATIVAN) 0.5 MG tablet Take 1 tablet (0.5 mg total) by mouth 2 (two) times daily as needed for anxiety. 30 tablet 1  . nortriptyline (PAMELOR) 10 MG capsule Take 1-2 capsules (10-20 mg total) by mouth at bedtime. 60 capsule 5  . olopatadine (PATANOL) 0.1 % ophthalmic solution Place 1 drop into both eyes 2 (two) times daily. 5 mL 1  . Omega-3 Fatty Acids (FISH OIL) 1000 MG CAPS Take 1 capsule by mouth 2 (two) times daily.      Marland Kitchen omeprazole (PRILOSEC) 40 MG capsule TAKE ONE CAPSULE BY MOUTH EVERY DAY AS NEEDED 90 capsule 3  . tamsulosin (FLOMAX) 0.4 MG CAPS capsule Take 1 capsule (0.4 mg total) by mouth daily. 30 capsule 5  . vardenafil (LEVITRA) 20 MG tablet Take 1 tablet (20 mg total) by mouth daily as needed for erectile dysfunction. 90 tablet 1  . amLODipine (NORVASC) 5 MG tablet Take 1 tablet (5 mg total) by mouth daily. (Patient not taking: Reported on 09/27/2019) 90 tablet 3  . Icosapent Ethyl (VASCEPA) 1 g CAPS Take 2 capsules (2 g total) by mouth 2 (two) times daily. (Patient not taking: Reported on 09/27/2019) 120 capsule 11  . levofloxacin (LEVAQUIN) 500 MG tablet Take 1 tablet (500 mg total) by mouth daily. (Patient not taking: Reported on 09/27/2019) 30 tablet 0   No facility-administered medications prior to visit.    ROS: Review of Systems  Constitutional: Negative for appetite change, fatigue and unexpected weight change.    HENT: Negative for congestion, nosebleeds, sneezing, sore throat and trouble swallowing.   Eyes: Negative for itching and visual disturbance.  Respiratory: Negative for cough.   Cardiovascular: Negative for chest pain, palpitations and leg swelling.  Gastrointestinal: Negative for abdominal distention, blood in stool, diarrhea and nausea.  Genitourinary: Positive for frequency. Negative for hematuria and urgency.  Musculoskeletal: Negative for back pain, gait problem, joint swelling and neck pain.  Skin: Negative for rash.  Neurological: Negative for dizziness, tremors, speech difficulty and weakness.  Psychiatric/Behavioral: Negative for agitation, dysphoric mood and sleep disturbance. The patient is not nervous/anxious.     Objective:  BP (!) 154/82 (BP Location: Left Arm, Patient Position: Sitting, Cuff Size: Large)   Pulse 74   Temp 98.2 F (36.8 C) (Oral)   Ht 6\' 2"  (1.88 m)   Wt 196 lb (88.9 kg)   SpO2 98%   BMI 25.16 kg/m   BP Readings from Last 3 Encounters:  09/27/19 (!) 154/82  06/29/19 (!) 166/92  06/15/19 (!) 154/84    Wt Readings from Last 3 Encounters:  09/27/19 196 lb (88.9 kg)  06/29/19 195 lb (88.5 kg)  06/15/19 193 lb 12.8 oz (87.9 kg)    Physical Exam Constitutional:      General: He is not in acute distress.    Appearance: He is  well-developed.     Comments: NAD  Eyes:     Conjunctiva/sclera: Conjunctivae normal.     Pupils: Pupils are equal, round, and reactive to light.  Neck:     Thyroid: No thyromegaly.     Vascular: No JVD.  Cardiovascular:     Rate and Rhythm: Normal rate and regular rhythm.     Heart sounds: Normal heart sounds. No murmur. No friction rub. No gallop.   Pulmonary:     Effort: Pulmonary effort is normal. No respiratory distress.     Breath sounds: Normal breath sounds. No wheezing or rales.  Chest:     Chest wall: No tenderness.  Abdominal:     General: Bowel sounds are normal. There is no distension.     Palpations:  Abdomen is soft. There is no mass.     Tenderness: There is no abdominal tenderness. There is no guarding or rebound.  Musculoskeletal:        General: No tenderness. Normal range of motion.     Cervical back: Normal range of motion.  Lymphadenopathy:     Cervical: No cervical adenopathy.  Skin:    General: Skin is warm and dry.     Findings: No rash.  Neurological:     Mental Status: He is alert and oriented to person, place, and time.     Cranial Nerves: No cranial nerve deficit.     Motor: No abnormal muscle tone.     Coordination: Coordination normal.     Gait: Gait normal.     Deep Tendon Reflexes: Reflexes are normal and symmetric.  Psychiatric:        Behavior: Behavior normal.        Thought Content: Thought content normal.        Judgment: Judgment normal.     Lab Results  Component Value Date   WBC 10.2 05/31/2019   HGB 15.3 05/31/2019   HCT 45.9 05/31/2019   PLT 420.0 (H) 05/31/2019   GLUCOSE 118 (H) 05/31/2019   CHOL 164 05/31/2019   TRIG 207.0 (H) 05/31/2019   HDL 32.40 (L) 05/31/2019   LDLDIRECT 105.0 05/31/2019   LDLCALC 135 (H) 05/17/2018   ALT 44 05/31/2019   AST 31 05/31/2019   NA 137 05/31/2019   K 4.4 05/31/2019   CL 101 05/31/2019   CREATININE 0.88 05/31/2019   BUN 24 (H) 05/31/2019   CO2 29 05/31/2019   TSH 1.21 05/31/2019   PSA 0.30 05/31/2019   HGBA1C 6.3 05/31/2019    US Abdomen Complete  Result Date: 01/30/2019 CLINICAL DATA:  Pruritis, weight loss EXAM: ABDOMEN ULTRASOUND COMPLETE COMPARISON:  CT 07/20/2017, ultrasound 08/28/2016 FINDINGS: Gallbladder: No gallstones or wall thickening visualized. No sonographic Murphy sign noted by sonographer. Common bile duct: Diameter: 3 mm Liver: No focal lesion identified. Diffusely echogenic liver with coarsened, inhomogeneous echotexture. Portal vein is patent on color Doppler imaging with normal direction of blood flow towards the liver. IVC: No abnormality visualized. Pancreas: Visualized portion  unremarkable. Spleen: Size and appearance within normal limits. Right Kidney: Length: 12.4 cm. Echogenicity within normal limits. 8 mm cyst within the interpolar region. No solid mass or hydronephrosis visualized. Left Kidney: Length: 11.8 cm. Echogenicity within normal limits. 1.8 cm simple cyst in the interpolar region. No solid mass or hydronephrosis visualized. Abdominal aorta: Proximal abdominal aorta measures 2.9 cm transverse. Mid abdominal aorta measures 2.5 cm transverse. Distal abdominal aorta measures 2.3 cm transverse. Other findings: None. IMPRESSION: 1. The echogenicity of the liver is  increased. This is a nonspecific finding but is most commonly seen with fatty infiltration of the liver. There are no obvious focal liver lesions. 2. Ectatic abdominal aorta at risk for aneurysm development. Recommend followup by ultrasound in 5 years. This recommendation follows ACR consensus guidelines: White Paper of the ACR Incidental Findings Committee II on Vascular Findings. J Am Coll Radiol 2013; 10:789-794. Electronically Signed   By: Davina Poke M.D.   On: 01/30/2019 09:33    Assessment & Plan:   There are no diagnoses linked to this encounter.   No orders of the defined types were placed in this encounter.    Follow-up: No follow-ups on file.  [name], MD

## 2019-09-27 NOTE — Assessment & Plan Note (Addendum)
Lorazepam as needed Better Pamelor

## 2019-09-29 DIAGNOSIS — R351 Nocturia: Secondary | ICD-10-CM | POA: Diagnosis not present

## 2019-10-05 ENCOUNTER — Other Ambulatory Visit: Payer: Self-pay

## 2019-10-05 MED ORDER — NORTRIPTYLINE HCL 10 MG PO CAPS: 10.0000 mg | ORAL_CAPSULE | Freq: Every day | ORAL | 1 refills | Status: DC

## 2019-10-14 DIAGNOSIS — I639 Cerebral infarction, unspecified: Secondary | ICD-10-CM

## 2019-10-14 HISTORY — DX: Cerebral infarction, unspecified: I63.9

## 2019-11-09 ENCOUNTER — Other Ambulatory Visit: Payer: Self-pay

## 2019-11-09 ENCOUNTER — Emergency Department: Payer: Medicare HMO

## 2019-11-09 ENCOUNTER — Inpatient Hospital Stay: Payer: Medicare HMO

## 2019-11-09 ENCOUNTER — Encounter: Payer: Self-pay | Admitting: Emergency Medicine

## 2019-11-09 ENCOUNTER — Inpatient Hospital Stay
Admission: EM | Admit: 2019-11-09 | Discharge: 2019-11-23 | DRG: 065 | Disposition: A | Discharge status: Skilled Nursing Facility | Payer: Medicare HMO | Attending: Internal Medicine | Admitting: Internal Medicine

## 2019-11-09 DIAGNOSIS — R0981 Nasal congestion: Secondary | ICD-10-CM | POA: Diagnosis not present

## 2019-11-09 DIAGNOSIS — F418 Other specified anxiety disorders: Secondary | ICD-10-CM | POA: Diagnosis present

## 2019-11-09 DIAGNOSIS — R5381 Other malaise: Secondary | ICD-10-CM | POA: Diagnosis not present

## 2019-11-09 DIAGNOSIS — Z87891 Personal history of nicotine dependence: Secondary | ICD-10-CM

## 2019-11-09 DIAGNOSIS — W19XXXA Unspecified fall, initial encounter: Secondary | ICD-10-CM | POA: Diagnosis present

## 2019-11-09 DIAGNOSIS — K219 Gastro-esophageal reflux disease without esophagitis: Secondary | ICD-10-CM | POA: Diagnosis present

## 2019-11-09 DIAGNOSIS — M25522 Pain in left elbow: Secondary | ICD-10-CM | POA: Diagnosis not present

## 2019-11-09 DIAGNOSIS — M6281 Muscle weakness (generalized): Secondary | ICD-10-CM | POA: Diagnosis not present

## 2019-11-09 DIAGNOSIS — R42 Dizziness and giddiness: Secondary | ICD-10-CM | POA: Diagnosis not present

## 2019-11-09 DIAGNOSIS — G8194 Hemiplegia, unspecified affecting left nondominant side: Secondary | ICD-10-CM | POA: Diagnosis present

## 2019-11-09 DIAGNOSIS — M549 Dorsalgia, unspecified: Secondary | ICD-10-CM | POA: Diagnosis present

## 2019-11-09 DIAGNOSIS — R7303 Prediabetes: Secondary | ICD-10-CM | POA: Diagnosis not present

## 2019-11-09 DIAGNOSIS — K2101 Gastro-esophageal reflux disease with esophagitis, with bleeding: Secondary | ICD-10-CM | POA: Diagnosis not present

## 2019-11-09 DIAGNOSIS — Z9842 Cataract extraction status, left eye: Secondary | ICD-10-CM

## 2019-11-09 DIAGNOSIS — N400 Enlarged prostate without lower urinary tract symptoms: Secondary | ICD-10-CM | POA: Diagnosis present

## 2019-11-09 DIAGNOSIS — Z9841 Cataract extraction status, right eye: Secondary | ICD-10-CM

## 2019-11-09 DIAGNOSIS — R29706 NIHSS score 6: Secondary | ICD-10-CM | POA: Diagnosis present

## 2019-11-09 DIAGNOSIS — R262 Difficulty in walking, not elsewhere classified: Secondary | ICD-10-CM | POA: Diagnosis not present

## 2019-11-09 DIAGNOSIS — I6621 Occlusion and stenosis of right posterior cerebral artery: Secondary | ICD-10-CM | POA: Diagnosis not present

## 2019-11-09 DIAGNOSIS — I951 Orthostatic hypotension: Secondary | ICD-10-CM | POA: Diagnosis not present

## 2019-11-09 DIAGNOSIS — Z20822 Contact with and (suspected) exposure to covid-19: Secondary | ICD-10-CM | POA: Diagnosis not present

## 2019-11-09 DIAGNOSIS — Z981 Arthrodesis status: Secondary | ICD-10-CM | POA: Diagnosis not present

## 2019-11-09 DIAGNOSIS — Y92003 Bedroom of unspecified non-institutional (private) residence as the place of occurrence of the external cause: Secondary | ICD-10-CM

## 2019-11-09 DIAGNOSIS — J9601 Acute respiratory failure with hypoxia: Secondary | ICD-10-CM | POA: Diagnosis not present

## 2019-11-09 DIAGNOSIS — F4323 Adjustment disorder with mixed anxiety and depressed mood: Secondary | ICD-10-CM | POA: Diagnosis not present

## 2019-11-09 DIAGNOSIS — Z7982 Long term (current) use of aspirin: Secondary | ICD-10-CM

## 2019-11-09 DIAGNOSIS — R0902 Hypoxemia: Secondary | ICD-10-CM | POA: Diagnosis not present

## 2019-11-09 DIAGNOSIS — R69 Illness, unspecified: Secondary | ICD-10-CM | POA: Diagnosis not present

## 2019-11-09 DIAGNOSIS — R0602 Shortness of breath: Secondary | ICD-10-CM

## 2019-11-09 DIAGNOSIS — I639 Cerebral infarction, unspecified: Secondary | ICD-10-CM | POA: Diagnosis not present

## 2019-11-09 DIAGNOSIS — G8929 Other chronic pain: Secondary | ICD-10-CM | POA: Diagnosis not present

## 2019-11-09 DIAGNOSIS — Z825 Family history of asthma and other chronic lower respiratory diseases: Secondary | ICD-10-CM

## 2019-11-09 DIAGNOSIS — M2550 Pain in unspecified joint: Secondary | ICD-10-CM | POA: Diagnosis not present

## 2019-11-09 DIAGNOSIS — R200 Anesthesia of skin: Secondary | ICD-10-CM | POA: Diagnosis not present

## 2019-11-09 DIAGNOSIS — R9431 Abnormal electrocardiogram [ECG] [EKG]: Secondary | ICD-10-CM | POA: Diagnosis present

## 2019-11-09 DIAGNOSIS — I69354 Hemiplegia and hemiparesis following cerebral infarction affecting left non-dominant side: Secondary | ICD-10-CM | POA: Diagnosis present

## 2019-11-09 DIAGNOSIS — Z86010 Personal history of colonic polyps: Secondary | ICD-10-CM

## 2019-11-09 DIAGNOSIS — I63511 Cerebral infarction due to unspecified occlusion or stenosis of right middle cerebral artery: Principal | ICD-10-CM | POA: Diagnosis present

## 2019-11-09 DIAGNOSIS — E785 Hyperlipidemia, unspecified: Secondary | ICD-10-CM | POA: Diagnosis present

## 2019-11-09 DIAGNOSIS — R278 Other lack of coordination: Secondary | ICD-10-CM | POA: Diagnosis present

## 2019-11-09 DIAGNOSIS — R531 Weakness: Secondary | ICD-10-CM | POA: Diagnosis not present

## 2019-11-09 DIAGNOSIS — Z7401 Bed confinement status: Secondary | ICD-10-CM | POA: Diagnosis not present

## 2019-11-09 DIAGNOSIS — I739 Peripheral vascular disease, unspecified: Secondary | ICD-10-CM | POA: Diagnosis present

## 2019-11-09 DIAGNOSIS — I6389 Other cerebral infarction: Secondary | ICD-10-CM | POA: Diagnosis not present

## 2019-11-09 DIAGNOSIS — Z03818 Encounter for observation for suspected exposure to other biological agents ruled out: Secondary | ICD-10-CM | POA: Diagnosis not present

## 2019-11-09 DIAGNOSIS — I10 Essential (primary) hypertension: Secondary | ICD-10-CM | POA: Diagnosis present

## 2019-11-09 DIAGNOSIS — H401131 Primary open-angle glaucoma, bilateral, mild stage: Secondary | ICD-10-CM | POA: Diagnosis not present

## 2019-11-09 DIAGNOSIS — Z79899 Other long term (current) drug therapy: Secondary | ICD-10-CM

## 2019-11-09 DIAGNOSIS — S59902A Unspecified injury of left elbow, initial encounter: Secondary | ICD-10-CM | POA: Diagnosis not present

## 2019-11-09 DIAGNOSIS — Z8673 Personal history of transient ischemic attack (TIA), and cerebral infarction without residual deficits: Secondary | ICD-10-CM | POA: Insufficient documentation

## 2019-11-09 DIAGNOSIS — S0990XA Unspecified injury of head, initial encounter: Secondary | ICD-10-CM | POA: Diagnosis not present

## 2019-11-09 LAB — PROTIME-INR
INR: 1 (ref 0.8–1.2)
Prothrombin Time: 12.5 seconds (ref 11.4–15.2)

## 2019-11-09 LAB — MAGNESIUM: Magnesium: 2 mg/dL (ref 1.7–2.4)

## 2019-11-09 LAB — COMPREHENSIVE METABOLIC PANEL
ALT: 24 U/L (ref 0–44)
AST: 26 U/L (ref 15–41)
Albumin: 4.6 g/dL (ref 3.5–5.0)
Alkaline Phosphatase: 85 U/L (ref 38–126)
Anion gap: 9 (ref 5–15)
BUN: 19 mg/dL (ref 8–23)
CO2: 26 mmol/L (ref 22–32)
Calcium: 8.9 mg/dL (ref 8.9–10.3)
Chloride: 104 mmol/L (ref 98–111)
Creatinine, Ser: 0.75 mg/dL (ref 0.61–1.24)
GFR calc Af Amer: >60 mL/min (ref >60)
GFR calc non Af Amer: >60 mL/min (ref >60)
Glucose, Bld: 144 mg/dL — ABNORMAL HIGH (ref 70–99)
Potassium: 3.9 mmol/L (ref 3.5–5.1)
Sodium: 139 mmol/L (ref 135–145)
Total Bilirubin: 1.6 mg/dL — ABNORMAL HIGH (ref 0.3–1.2)
Total Protein: 8.2 g/dL — ABNORMAL HIGH (ref 6.5–8.1)

## 2019-11-09 LAB — DIFFERENTIAL
Abs Immature Granulocytes: 0.03 10*3/uL (ref 0.00–0.07)
Basophils Absolute: 0.1 10*3/uL (ref 0.0–0.1)
Basophils Relative: 1 %
Eosinophils Absolute: 0.1 10*3/uL (ref 0.0–0.5)
Eosinophils Relative: 1 %
Immature Granulocytes: 0 %
Lymphocytes Relative: 21 %
Lymphs Abs: 1.8 10*3/uL (ref 0.7–4.0)
Monocytes Absolute: 0.7 10*3/uL (ref 0.1–1.0)
Monocytes Relative: 8 %
Neutro Abs: 5.9 10*3/uL (ref 1.7–7.7)
Neutrophils Relative %: 69 %

## 2019-11-09 LAB — CBC
HCT: 47.8 % (ref 39.0–52.0)
Hemoglobin: 16.3 g/dL (ref 13.0–17.0)
MCH: 28.3 pg (ref 26.0–34.0)
MCHC: 34.1 g/dL (ref 30.0–36.0)
MCV: 83.1 fL (ref 80.0–100.0)
Platelets: 231 10*3/uL (ref 150–400)
RBC: 5.75 MIL/uL (ref 4.22–5.81)
RDW: 13.9 % (ref 11.5–15.5)
WBC: 8.6 10*3/uL (ref 4.0–10.5)
nRBC: 0 % (ref 0.0–0.2)

## 2019-11-09 LAB — HEMOGLOBIN A1C
Hgb A1c MFr Bld: 6.7 % — ABNORMAL HIGH (ref 4.8–5.6)
Mean Plasma Glucose: 145.59 mg/dL

## 2019-11-09 LAB — APTT: aPTT: 33 seconds (ref 24–36)

## 2019-11-09 LAB — GLUCOSE, CAPILLARY: Glucose-Capillary: 145 mg/dL — ABNORMAL HIGH (ref 70–99)

## 2019-11-09 LAB — SARS CORONAVIRUS 2 BY RT PCR (HOSPITAL ORDER, PERFORMED IN ~~LOC~~ HOSPITAL LAB): SARS Coronavirus 2: NEGATIVE

## 2019-11-09 MED ORDER — MELATONIN 5 MG PO TABS
5.0000 mg | ORAL_TABLET | Freq: Every day | ORAL | Status: DC
  Administered 2019-11-09 – 2019-11-22 (×10): 5 mg via ORAL
  Filled 2019-11-09 (×15): qty 1

## 2019-11-09 MED ORDER — OMEGA-3-ACID ETHYL ESTERS 1 G PO CAPS
1.0000 g | ORAL_CAPSULE | Freq: Every day | ORAL | Status: DC
  Administered 2019-11-10 – 2019-11-23 (×14): 1 g via ORAL
  Filled 2019-11-09 (×15): qty 1

## 2019-11-09 MED ORDER — HYDRALAZINE HCL 20 MG/ML IJ SOLN
5.0000 mg | Freq: Four times a day (QID) | INTRAMUSCULAR | Status: DC | PRN
  Administered 2019-11-09: 5 mg via INTRAVENOUS
  Filled 2019-11-09: qty 1

## 2019-11-09 MED ORDER — ACETAMINOPHEN 650 MG RE SUPP: 650.0000 mg | RECTAL | Status: DC | PRN

## 2019-11-09 MED ORDER — ACETAMINOPHEN 325 MG PO TABS
650.0000 mg | ORAL_TABLET | ORAL | Status: DC | PRN
  Administered 2019-11-09 – 2019-11-22 (×15): 650 mg via ORAL
  Filled 2019-11-09 (×16): qty 2

## 2019-11-09 MED ORDER — SENNOSIDES-DOCUSATE SODIUM 8.6-50 MG PO TABS
1.0000 | ORAL_TABLET | Freq: Every evening | ORAL | Status: DC | PRN
  Administered 2019-11-14 – 2019-11-20 (×3): 1 via ORAL
  Filled 2019-11-09 (×3): qty 1

## 2019-11-09 MED ORDER — ENOXAPARIN SODIUM 40 MG/0.4ML ~~LOC~~ SOLN
40.0000 mg | SUBCUTANEOUS | Status: DC
  Administered 2019-11-10 – 2019-11-22 (×11): 40 mg via SUBCUTANEOUS
  Filled 2019-11-09 (×12): qty 0.4

## 2019-11-09 MED ORDER — ASCORBIC ACID 500 MG PO TABS
250.0000 mg | ORAL_TABLET | Freq: Every day | ORAL | Status: DC
  Administered 2019-11-10 – 2019-11-23 (×14): 250 mg via ORAL
  Filled 2019-11-09 (×14): qty 1

## 2019-11-09 MED ORDER — VITAMIN D 25 MCG (1000 UNIT) PO TABS
1000.0000 [IU] | ORAL_TABLET | Freq: Every day | ORAL | Status: DC
  Administered 2019-11-10 – 2019-11-23 (×14): 1000 [IU] via ORAL
  Filled 2019-11-09 (×14): qty 1

## 2019-11-09 MED ORDER — STROKE: EARLY STAGES OF RECOVERY BOOK: Freq: Once | Status: DC

## 2019-11-09 MED ORDER — IOHEXOL 350 MG/ML SOLN
75.0000 mL | Freq: Once | INTRAVENOUS | Status: AC | PRN
  Administered 2019-11-09: 75 mL via INTRAVENOUS

## 2019-11-09 MED ORDER — OLOPATADINE HCL 0.1 % OP SOLN
1.0000 [drp] | Freq: Two times a day (BID) | OPHTHALMIC | Status: DC
  Administered 2019-11-10 – 2019-11-23 (×25): 1 [drp] via OPHTHALMIC
  Filled 2019-11-09 (×2): qty 5

## 2019-11-09 MED ORDER — TAMSULOSIN HCL 0.4 MG PO CAPS
0.4000 mg | ORAL_CAPSULE | Freq: Every day | ORAL | Status: DC
  Administered 2019-11-10 – 2019-11-23 (×14): 0.4 mg via ORAL
  Filled 2019-11-09 (×14): qty 1

## 2019-11-09 MED ORDER — LORAZEPAM 0.5 MG PO TABS
0.5000 mg | ORAL_TABLET | Freq: Two times a day (BID) | ORAL | Status: DC | PRN
  Administered 2019-11-11 – 2019-11-22 (×13): 0.5 mg via ORAL
  Filled 2019-11-09 (×13): qty 1

## 2019-11-09 MED ORDER — ASPIRIN 81 MG PO CHEW
324.0000 mg | CHEWABLE_TABLET | Freq: Once | ORAL | Status: AC
  Administered 2019-11-09: 324 mg via ORAL
  Filled 2019-11-09: qty 4

## 2019-11-09 MED ORDER — LORATADINE 10 MG PO TABS
10.0000 mg | ORAL_TABLET | Freq: Every day | ORAL | Status: DC
  Administered 2019-11-10 – 2019-11-23 (×14): 10 mg via ORAL
  Filled 2019-11-09 (×14): qty 1

## 2019-11-09 MED ORDER — SODIUM CHLORIDE 0.9 % IV SOLN: INTRAVENOUS | Status: DC

## 2019-11-09 MED ORDER — ASPIRIN EC 325 MG PO TBEC
325.0000 mg | DELAYED_RELEASE_TABLET | Freq: Every day | ORAL | Status: DC
  Administered 2019-11-10: 325 mg via ORAL
  Filled 2019-11-09: qty 1

## 2019-11-09 MED ORDER — ACETAMINOPHEN 160 MG/5ML PO SOLN
650.0000 mg | ORAL | Status: DC | PRN
  Filled 2019-11-09: qty 20.3

## 2019-11-09 NOTE — ED Notes (Signed)
Pt transported to CT at this time.

## 2019-11-09 NOTE — H&P (Addendum)
History and Physical    Francis Sims. [id number] DOB: 09/21/1940 DOA: 11/09/2019  PCP: Cassandria Anger, MD  Patient coming from:  Home   Chief Complaint: Left -sided weakness and numbness since 1 day  HPI: Francis Sims. is a 79 y.o. male with medical history significant of GERD, situational mixed anxiety and depression, elevated blood pressure diagnosed at [facility], BPH edema and prediabetes (A1c of 6.3 based on last lab) who presented to the ED with 1 day history of left-sided weakness and numbness.  Patient reports being fine until last evening when he started having some weakness in his left lower leg and left forearm and hand associated with numbness mainly in the left leg and left hand.  This morning he woke up and had similar symptoms, feeling dizzy and unsteady on his feet.  As per wife patient has had 4 falls at home in the past 6 months without any loss of consciousness, focal weaknesses or injuries.  Denies headache, blurred vision, diplopia, nausea, vomiting, fevers, chills, chest pain, shortness of breath, abdominal pain, dysuria, diarrhea..  Denies any recent illness, change in the medications or sick contact.  Denies any fall.  Patient reports that he has had weight loss in the past 1 year since the death of his son and his wife being diagnosed with cancer but has started gaining the weight back.  At baseline he is quite active and plays tennis regularly.  Reports remote smoking history.  No alcohol or illicit drug use.   ED Course: In the ED vital stable except for elevated blood pressure 185/100 mmHg (was 210/130 mmHg on my evaluation). Blood work showed normal CBC and chemistry except for blood glucose of 144.  UA unremarkable. Head CT done negative for acute findings but showed chronic small vessel white matter disease.  MRI of the brain showed small acute infarct in the posterior limb of the right internal capsule.  Also shows chronic  microvascular ischemic changes. Patient did not receive tPA as he was out of the therapeutic window for tPA.  Patient given full dose aspirin and hospitalist consult for admission to progressive care.  Review of Systems: As per HPI otherwise all other systems reviewed and are negative.   Past Medical History:  Diagnosis Date  . Burping    per pt lots of burping  . Chronic back pain    pinched nerve;buldging disc  . Dry skin    on elbows;uses vasaline and it goes away  . GERD (gastroesophageal reflux disease)    OTC  . Hemorrhoids   . Hx of colonic polyps    50yrs ago  . Hypertension   . Nocturia   . PONV (postoperative nausea and vomiting)     Past Surgical History:  Procedure Laterality Date  . BACK SURGERY    . BROW LIFT Right 07/12/2013   Procedure: CONTRACTURE  RELEASE ZPLASTY OF RIGHT EYE, PERIORBITAL AREA WITH REPAIR OF PTOSIS OF RIGHT EYE BROW;  Surgeon: Theodoro Kos, DO;  Location: [facility];  Service: Plastics;  Laterality: Right;  . CATARACT EXTRACTION  2008   bilateral  . CERVICAL FUSION  2000   C5,6,7  . COLONOSCOPY    . EYE SURGERY    . HERNIA REPAIR  1949  . LUMBAR LAMINECTOMY/DECOMPRESSION MICRODISCECTOMY  05/26/2011   Procedure: LUMBAR LAMINECTOMY/DECOMPRESSION MICRODISCECTOMY;  Surgeon: Olga Coaster Kritzer;  Location: [facility];  Service: Neurosurgery;  Laterality: Right;  Right Lumbar three-four Microdiskectomy  .  TONSILLECTOMY      Social History  reports that he has quit smoking. He has never used smokeless tobacco. He reports that he does not drink alcohol or use drugs.  Allergies  Allergen Reactions  . Penicillins Hives and Swelling  . Sulfonamide Derivatives Other (See Comments)    Almost stopped heart in the service..Also, causes hives  . Hydroxyzine     Too strong  . Ibuprofen Itching  . Statins     aches    Family History  Problem Relation Age of Onset  . COPD Other   . Anesthesia problems Neg Hx   . Hypotension Neg  Hx   . Malignant hyperthermia Neg Hx   . Pseudochol deficiency Neg Hx    Heart disease in family.  Prior to Admission medications   Medication Sig Start Date End Date Taking? Authorizing Provider  cholecalciferol (VITAMIN D) 1000 UNITS tablet Take 1,000 Units by mouth daily.     Yes [provider]  nortriptyline (PAMELOR) 10 MG capsule Take 1-2 capsules (10-20 mg total) by mouth at bedtime. 10/05/19  Yes Plotnikov, Evie Lacks, MD  Omega-3 Fatty Acids (FISH OIL) 1000 MG CAPS Take 1 capsule by mouth 2 (two) times daily.     Yes [provider]  omeprazole (PRILOSEC) 40 MG capsule TAKE ONE CAPSULE BY MOUTH EVERY DAY AS NEEDED Patient taking differently: Take 40 mg by mouth daily as needed.  05/01/19  Yes Plotnikov, Evie Lacks, MD  tamsulosin (FLOMAX) 0.4 MG CAPS capsule Take 1 capsule (0.4 mg total) by mouth daily. 06/15/19  Yes Plotnikov, Evie Lacks, MD  vitamin C (ASCORBIC ACID) 250 MG tablet Take 250 mg by mouth daily.   Yes [provider]  amLODipine (NORVASC) 5 MG tablet Take 1 tablet (5 mg total) by mouth daily. Patient not taking: Reported on 09/27/2019 06/29/19 06/28/20  Plotnikov, Evie Lacks, MD  aspirin EC 81 MG tablet Take 81 mg by mouth daily.    [provider]  cetirizine (ZYRTEC ALLERGY) 10 MG tablet Take 1 tablet (10 mg total) by mouth daily. 11/23/18 11/23/19  Plotnikov, Evie Lacks, MD  LORazepam (ATIVAN) 0.5 MG tablet Take 1 tablet (0.5 mg total) by mouth 2 (two) times daily as needed for anxiety. 06/15/19   Plotnikov, Evie Lacks, MD  olopatadine (PATANOL) 0.1 % ophthalmic solution Place 1 drop into both eyes 2 (two) times daily. 11/23/18 11/23/19  Plotnikov, Evie Lacks, MD  vardenafil (LEVITRA) 20 MG tablet Take 1 tablet (20 mg total) by mouth daily as needed for erectile dysfunction. 12/18/16   Plotnikov, Evie Lacks, MD    Physical Exam: Vitals:   11/09/19 0950 11/09/19 1133 11/09/19 1147 11/09/19 1530  BP:  (!) 185/100 (!) 185/100 (!) 181/88  Pulse:   75 73 80  Resp:  19 20 20   Temp:  97.7 F (36.5 C) 97.7 F (36.5 C)   TempSrc:  Oral Oral   SpO2:  95% 97% 99%  Weight: 88.5 kg     Height: 6\' 2"  (1.88 m)       Constitutional: NAD, calm, comfortable Vitals:   11/09/19 0950 11/09/19 1133 11/09/19 1147 11/09/19 1530  BP:  (!) 185/100 (!) 185/100 (!) 181/88  Pulse:  75 73 80  Resp:  19 20 20   Temp:  97.7 F (36.5 C) 97.7 F (36.5 C)   TempSrc:  Oral Oral   SpO2:  95% 97% 99%  Weight: 88.5 kg     Height: 6\' 2"  (1.88 m)  General: Elderly male appears anxious, in no acute distress Eyes: Pupils reactive bilaterally, extraocular muscles intact ENMT: Moist mucous membrane, normal dentition.  Neck: normal, supple, no masses, no thyromegaly, no carotid bruit Respiratory: Clear to auscultation bilaterally, no added sounds Cardiovascular: Regular rate and rhythm, no murmurs, rubs or gallop Abdomen: Soft, nondistended, nontender, bowel sounds present Musculoskeletal: Warm, no edema, normal skin CNS: Alert and oriented x3, pupils reactive bilaterally, extraocular muscle intact, no facial droop normal motor tone, power 4+/5 in left lower extremity, reports diminished sensation and left leg and tingling in the left hand.  Normal alternating hand movements, normal reflexes bilaterally, plantars equivocal bilaterally.  Gait not assessed.    Labs on Admission: I have personally reviewed following labs and imaging studies  CBC: Recent Labs  Lab 11/09/19 0958  WBC 8.6  NEUTROABS 5.9  HGB 16.3  HCT 47.8  MCV 83.1  PLT [id number]    Basic Metabolic Panel: Recent Labs  Lab 11/09/19 0958  NA 139  K 3.9  CL 104  CO2 26  GLUCOSE 144*  BUN 19  CREATININE 0.75  CALCIUM 8.9    GFR: Estimated Creatinine Clearance: 91.3 mL/min (by C-G formula based on SCr of 0.75 mg/dL).  Liver Function Tests: Recent Labs  Lab 11/09/19 0958  AST 26  ALT 24  ALKPHOS 85  BILITOT 1.6*  PROT 8.2*  ALBUMIN 4.6    Urine analysis:    Component  Value Date/Time   COLORURINE YELLOW 05/31/2019 1148   APPEARANCEUR CLEAR 05/31/2019 1148   LABSPEC 1.025 05/31/2019 1148   PHURINE 6.0 05/31/2019 1148   GLUCOSEU NEGATIVE 05/31/2019 [place] 05/31/2019 [place] 05/31/2019 1148   BILIRUBINUR 1+ 05/25/2019 1500   KETONESUR NEGATIVE 05/31/2019 1148   PROTEINUR Positive (A) 05/25/2019 1500   UROBILINOGEN 0.2 05/31/2019 1148   NITRITE NEGATIVE 05/31/2019 1148   LEUKOCYTESUR NEGATIVE 05/31/2019 1148    Radiological Exams on Admission: DG Elbow Complete Left  Result Date: 11/09/2019 CLINICAL DATA:  [place] last evening, numbness and pain at left elbow EXAM: LEFT ELBOW - COMPLETE 3+ VIEW COMPARISON:  None. FINDINGS: Frontal, bilateral oblique, and lateral views of the left elbow are obtained. No fracture, subluxation, or dislocation. Joint spaces are well preserved. No joint effusion. IMPRESSION: 1. Unremarkable left elbow. Electronically Signed   By: Randa Ngo M.D.   On: 11/09/2019 15:45   CT HEAD WO CONTRAST  Result Date: 11/09/2019 CLINICAL DATA:  Mental status changes.  Fall. EXAM: CT HEAD WITHOUT CONTRAST TECHNIQUE: Contiguous axial images were obtained from the base of the skull through the vertex without intravenous contrast. COMPARISON:  04/10/2013 FINDINGS: Brain: There is no evidence for acute hemorrhage, hydrocephalus, mass lesion, or abnormal extra-axial fluid collection. No definite CT evidence for acute infarction. Diffuse loss of parenchymal volume is consistent with atrophy. Patchy low attenuation in the deep hemispheric and periventricular white matter is nonspecific, but likely reflects chronic microvascular ischemic demyelination. Vascular: No hyperdense vessel or unexpected calcification. Skull: No evidence for fracture. No worrisome lytic or sclerotic lesion. Sinuses/Orbits: The visualized paranasal sinuses and mastoid air cells are clear. Visualized portions of the globes and intraorbital fat are  unremarkable. Other: None. IMPRESSION: 1. No acute intracranial abnormality. 2. Atrophy with chronic small vessel white matter ischemic disease. Electronically Signed   By: Misty Stanley M.D.   On: 11/09/2019 10:28   MR BRAIN WO CONTRAST  Result Date: 11/09/2019 CLINICAL DATA:  Right facial droop, left arm numbness EXAM: MRI  HEAD WITHOUT CONTRAST TECHNIQUE: Multiplanar, multiecho pulse sequences of the brain and surrounding structures were obtained without intravenous contrast. COMPARISON:  Correlation made with prior CT imaging. FINDINGS: Motion artifact is present Brain: There is a 1.5 cm focus of reduced diffusion in the region of the posterior limb of the right internal capsule. Prominence of the ventricles and sulci reflects parenchymal volume loss. Patchy T2 hyperintensity in the supratentorial white matter is nonspecific but reflect mild chronic microvascular ischemic changes. Chronic small vessel infarcts of the posterior left nucleus and subinsular white matter. Susceptibility along the left cerebral peduncle likely reflects chronic blood products, mineralization, or cavernous malformation. There is no intracranial mass, mass effect, or edema. There is no hydrocephalus or extra-axial fluid collection. Vascular: Major vessel flow voids at the skull base are preserved. Skull and upper cervical spine: Normal marrow signal is preserved. Sinuses/Orbits: Paranasal sinuses are aerated. Orbits are unremarkable. Other: Sella is unremarkable.  Mastoid air cells are clear. IMPRESSION: Small acute infarct in the region of the posterior limb of the right internal capsule. Chronic findings detailed above. Electronically Signed   By: Macy Mis M.D.   On: 11/09/2019 15:16    EKG: Normal sinus rhythm at 73, LVH (per aVL), prolonged QTC of 537.  Assessment/Plan Principal Problem:    Acute ischemic right MCA stroke ([place]) Admit to progressive care unit.  Risk factor includes age, prediabetes, dyslipidemia,?   Hypertension and remote smoking history. Aspirin 325 mg daily (patient on 81 mg every alternate day at home).  Patient intolerant to statin.  Check A1c and lipid panel.  Cleared bedside swallow evaluation.  Heart healthy diet. PT/OT evaluation.  Neurochecks per unit protocol.  Allow permissive hypertension. Obtain CT angiogram head and neck and 2D echo.  Neurology consult in a.m.  Active Problems: Essential/accelerated hypertension On low-dose amlodipine which will be held.  Allow permissive blood pressure.  Dyslipidemia Has triglyceridemia.  Intolerant to statin.  Check lipid panel.     Situational mixed anxiety and depressive disorder Reports being started on low-dose Ativan as needed which I will continue.  BPH Continue Flomax  Prolonged QTC QTC of 537.  Not on any QT prolonging agents.  Monitor on telemetry.  Check magnesium.  DVT prophylaxis: Subcu Lovenox Code Status:   Full code Family Communication:  None at bedside.  Wife involved in care and was updated on the phone.  Disposition Plan:   Patient is from:  Home  Anticipated DC to:  Home  Anticipated DC date:  11/11/2018  Anticipated DC barriers: Acute stroke.  Pending work-up  Consults called:  Neurology (will see in a.m.) Admission status:  Inpatient  Severity of Illness: The appropriate patient status for this patient is INPATIENT. Inpatient status is judged to be reasonable and necessary in order to provide the required intensity of service to ensure the patient's safety. The patient's presenting symptoms, physical exam findings, and initial radiographic and laboratory data in the context of their chronic comorbidities is felt to place them at high risk for further clinical deterioration. Furthermore, it is not anticipated that the patient will be medically stable for discharge from the hospital within 2 midnights of admission. The following factors support the patient status of inpatient.   " The patient's presenting  symptoms include acute stroke. " The worrisome physical exam findings include left-sided weakness " The initial radiographic and laboratory data are worrisome because of acute ischemic stroke " The chronic co-morbidities include accelerated hypertension    I certify that at the  point of admission it is my clinical judgment that the patient will require inpatient hospital care spanning beyond 2 midnights from the point of admission due to high intensity of service, high risk for further deterioration and high frequency of surveillance required.     Louellen Molder MD [facility]  How to contact the [facility] Attending or Consulting provider [place] or covering provider during after hours [place], for this patient?   1. Check the care team in [facility] and look for a) attending/consulting [facility] provider listed and b) the [facility] team listed 2. Log into [email or web address] and use [place]'s universal password to access. If you do not have the password, please contact the hospital operator. 3. Locate the [facility] provider you are looking for under [facility] and page to a number that you can be directly reached. 4. If you still have difficulty reaching the provider, please page the [facility] (Director on Call) for the Hospitalists listed on amion for assistance.  11/09/2019, 4:40 PM

## 2019-11-09 NOTE — ED Notes (Signed)
Pt returned from MRI at this time, pt repositioned in bed at this time, urinal emptied by this RN at this time. Pt A&O x4. Requesting something to eat at this time.

## 2019-11-09 NOTE — ED Notes (Signed)
Pt repositioned in bed at this time, pt given meal tray at this time.

## 2019-11-09 NOTE — ED Notes (Signed)
Pt repositioned in bed at this time, given meal tray at this time. Covid swab collected by this RN at this time.
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
## 2019-11-09 NOTE — ED Notes (Signed)
This RN requested Stroke Coordinator Olivia to assist in determining last known well.  After speaking with patient, it seems last known well was when patient went to bed around 10pm last night.

## 2019-11-09 NOTE — ED Notes (Signed)
Pt resting in bed, speaking on personal cell phone at this time.

## 2019-11-09 NOTE — ED Notes (Signed)
EDP Quentin Cornwall at bedside to speak with patient at this time.

## 2019-11-09 NOTE — ED Notes (Signed)
Admitting MD at bedside.

## 2019-11-09 NOTE — ED Notes (Signed)
See downtime charting. 

## 2019-11-09 NOTE — ED Notes (Signed)
Pt resting in bed, repositioned by this RN at this time. NAD noted at this time.

## 2019-11-09 NOTE — ED Triage Notes (Signed)
Patient presents to the ED via EMS from home.  Patient states around 10:30pm patient, "felt weird" and sat up on the side of the bed, and then fell off the bed.  Patient states he "scooted" to his recline and pulled himself up with his arms into his recliner and then was able, last night around 1am and 5am to walk to the bathroom, but this morning patient attempted to get up from the recliner and fell again, and then had to "scoot" down the stairs.  Patient's smile appears slightly uneven, with right sided facial droop and patient is complaining of numbness to his left arm.  Patient is alert and oriented x 4.  When this RN touched patient's face bilaterally, he states sensation is stronger on the right side but feels the same to arms and legs.  Patient has no drift with arms but slight left leg drift.

## 2019-11-10 ENCOUNTER — Inpatient Hospital Stay (HOSPITAL_COMMUNITY)
Admit: 2019-11-10 | Discharge: 2019-11-10 | Disposition: A | Discharge status: Home / Self Care | Payer: Medicare HMO | Attending: Internal Medicine | Admitting: Internal Medicine

## 2019-11-10 ENCOUNTER — Other Ambulatory Visit: Payer: Self-pay

## 2019-11-10 DIAGNOSIS — I63511 Cerebral infarction due to unspecified occlusion or stenosis of right middle cerebral artery: Principal | ICD-10-CM

## 2019-11-10 DIAGNOSIS — I6389 Other cerebral infarction: Secondary | ICD-10-CM

## 2019-11-10 LAB — LIPID PANEL
Cholesterol: 185 mg/dL (ref 0–200)
HDL: 34 mg/dL — ABNORMAL LOW (ref >40)
LDL Cholesterol: 135 mg/dL — ABNORMAL HIGH (ref 0–99)
Total CHOL/HDL Ratio: 5.4 RATIO
Triglycerides: 78 mg/dL (ref <150)
VLDL: 16 mg/dL (ref 0–40)

## 2019-11-10 LAB — ECHOCARDIOGRAM COMPLETE
Height: 74 in
Weight: 3120 oz

## 2019-11-10 MED ORDER — CLOPIDOGREL BISULFATE 75 MG PO TABS
75.0000 mg | ORAL_TABLET | Freq: Every day | ORAL | Status: DC
  Administered 2019-11-10 – 2019-11-23 (×14): 75 mg via ORAL
  Filled 2019-11-10 (×15): qty 1

## 2019-11-10 MED ORDER — ZOLPIDEM TARTRATE 5 MG PO TABS
5.0000 mg | ORAL_TABLET | Freq: Every evening | ORAL | Status: DC | PRN
  Administered 2019-11-10 – 2019-11-22 (×12): 5 mg via ORAL
  Filled 2019-11-10 (×12): qty 1

## 2019-11-10 MED ORDER — TRAMADOL HCL 50 MG PO TABS
50.0000 mg | ORAL_TABLET | Freq: Once | ORAL | Status: AC
  Administered 2019-11-10: 50 mg via ORAL
  Filled 2019-11-10: qty 1

## 2019-11-10 MED ORDER — ASPIRIN 81 MG PO CHEW
81.0000 mg | CHEWABLE_TABLET | Freq: Every day | ORAL | Status: DC
  Administered 2019-11-11 – 2019-11-23 (×13): 81 mg via ORAL
  Filled 2019-11-10 (×13): qty 1

## 2019-11-10 NOTE — ED Notes (Signed)
Pt requesting medication for a headache.

## 2019-11-10 NOTE — ED Notes (Signed)
PT with pt.

## 2019-11-10 NOTE — Progress Notes (Signed)
PROGRESS NOTE    Francis Sims.  [id number] DOB: [birth year] DOA: 11/09/2019 PCP: Cassandria Anger, MD   Chief Complaint  Patient presents with  . Fall  . Weakness    Brief Narrative:  79 year old male with history of GERD, situational mixed anxiety and depression, elevated blood pressure, prediabetes and BPH presented to the ED with 1 day history of left-sided weakness and numbness.  Reportedly has had 4 episodes of falls in the past 6 months without loss of consciousness or any injury. MRI done in the ED showed acute small infarct in the posterior limb of the right internal capsule.  Patient outside the window for TPA and did not receive TPA.  Admitted for further management.     Assessment & Plan:   Principal Problem:   Acute ischemic right MCA stroke (HCC) Likely small vessel disease.  CTA  head and neck showed severe stenosis at right P1-P2 junction, no other significant stenosis.  2D echo shows EF of 60-35% with no wall motion abnormality and LVH. Neurology consult appreciated and recommends dual antiplatelet therapy with aspirin (81 mg and Plavix 70 mg for 3 weeks followed by Plavix monotherapy daily.  LDL of 135 but patient intolerant to statin.  A1c of 6.7. Allow permissive hypertension for now followed by aggressive blood pressure control. Outpatient follow-up with neurology/stroke clinic. PT/OT recommends CIR given some residual left lower extremity weakness and severe gait impairment.  CIR consulted.  Active Problems: Uncontrolled/accelerated hypertension Diagnosis of whitecoat hypertension per primary.  On low-dose amlodipine (patient reports that he was taken off by PCP).  Allow permissive hypertension for now and subsequently needs tight blood pressure control.  Situational mixed anxiety and depressive disorder On low-dose Ativan as needed.  BPH Continue Flomax   DVT prophylaxis: Subcu Lovenox Code Status: Full Family Communication: Wife involved  in care Disposition:   Status is: Inpatient  Remains inpatient appropriate because:Unsafe d/c plan   Dispo: The patient is from: Home              Anticipated d/c is to: CIR              Anticipated d/c date is: 1 day              Patient currently is not medically stable to d/c.        Consultants:   Neurology   Procedures: CT head, MRI brain, CT angiogram head and neck, 2D echo   Antimicrobials: None   Subjective: Seen and examined.  Still reports some weakness in the left lower leg and numbness in the left hand.  Complains of headache this morning.  Objective: Vitals:   11/10/19 1000 11/10/19 1130 11/10/19 1230 11/10/19 1300  BP: (!) 175/84 (!) 194/86 (!) 167/81 (!) 145/78  Pulse: 83 80 79 85  Resp: 19 (!) 21 16 20   Temp:      TempSrc:      SpO2: 97% 94% 95% 95%  Weight:      Height:        Intake/Output Summary (Last 24 hours) at 11/10/2019 1629 Last data filed at 11/09/2019 1830 Gross per 24 hour  Intake --  Output 800 ml  Net -800 ml   Filed Weights   11/09/19 0950  Weight: 88.5 kg    Examination:  Elderly male in no acute distress HEENT: Moist visible supple neck Chest: Clear bilaterally CVs: Normal S1-S2, no murmurs GI: Soft, nondistended, nontender Musculoskeletal: Warm, no edema CNs: Alert and oriented,  4+/5 power over left lower leg, diminished sensation over the left hand and lower leg    Data Reviewed: I have personally reviewed following labs and imaging studies  CBC: Recent Labs  Lab 11/09/19 0958  WBC 8.6  NEUTROABS 5.9  HGB 16.3  HCT 47.8  MCV 83.1  PLT [id number]    Basic Metabolic Panel: Recent Labs  Lab 11/09/19 0958  NA 139  K 3.9  CL 104  CO2 26  GLUCOSE 144*  BUN 19  CREATININE 0.75  CALCIUM 8.9  MG 2.0    GFR: Estimated Creatinine Clearance: 91.3 mL/min (by C-G formula based on SCr of 0.75 mg/dL).  Liver Function Tests: Recent Labs  Lab 11/09/19 0958  AST 26  ALT 24  ALKPHOS 85  BILITOT 1.6*   PROT 8.2*  ALBUMIN 4.6    CBG: Recent Labs  Lab 11/09/19 1003  GLUCAP 145*     Recent Results (from the past 240 hour(s))  SARS Coronavirus 2 by RT PCR (hospital order, performed in [facility] hospital lab) Nasopharyngeal Nasopharyngeal Swab     Status: None   Collection Time: 11/09/19  4:45 PM   Specimen: Nasopharyngeal Swab  Result Value Ref Range Status   SARS Coronavirus 2 NEGATIVE NEGATIVE Final    Comment: (NOTE) SARS-CoV-2 target nucleic acids are NOT DETECTED. The SARS-CoV-2 RNA is generally detectable in upper and lower respiratory specimens during the acute phase of infection. The lowest concentration of SARS-CoV-2 viral copies this assay can detect is 250 copies / mL. A negative result does not preclude SARS-CoV-2 infection and should not be used as the sole basis for treatment or other patient management decisions.  A negative result may occur with improper specimen collection / handling, submission of specimen other than nasopharyngeal swab, presence of viral mutation(s) within the areas targeted by this assay, and inadequate number of viral copies (<250 copies / mL). A negative result must be combined with clinical observations, patient history, and epidemiological information. Fact Sheet for Patients:   StrictlyIdeas.no Fact Sheet for Healthcare Providers: BankingDealers.co.za This test is not yet approved or cleared  by the [place] FDA and has been authorized for detection and/or diagnosis of SARS-CoV-2 by FDA under an Emergency Use Authorization (EUA).  This EUA will remain in effect (meaning this test can be used) for the duration of the COVID-19 declaration under Section 564(b)(1) of the Act, 21 U.S.C. section 360bbb-3(b)(1), unless the authorization is terminated or revoked sooner. Performed at [facility], [address] [place], [place] [address]          Radiology Studies: CT  ANGIO HEAD W OR WO CONTRAST  Result Date: 11/09/2019 CLINICAL DATA:  Dizziness, fall, small infarct on MRI EXAM: CT ANGIOGRAPHY HEAD AND NECK TECHNIQUE: Multidetector CT imaging of the head and neck was performed using the standard protocol during bolus administration of intravenous contrast. Multiplanar CT image reconstructions and MIPs were obtained to evaluate the vascular anatomy. Carotid stenosis measurements (when applicable) are obtained utilizing NASCET criteria, using the distal internal carotid diameter as the denominator. CONTRAST:  31mL OMNIPAQUE IOHEXOL 350 MG/ML SOLN COMPARISON:  None. FINDINGS: CT HEAD Brain: There is no acute intracranial hemorrhage. Small infarct on MRI is not visible. No new loss of gray-white differentiation. Small chronic infarcts of the left lentiform nucleus/subinsular white matter. Ventricles are stable in size. Vascular: No new finding. Skull: Calvarium is unremarkable. Sinuses/Orbits: No acute finding. Other: None. Review of the MIP images confirms the above findings CTA NECK  Aortic arch: Mild calcified plaque along the arch and patent great vessel origins. Right carotid system: Patent. There is atherosclerotic irregularity of the common carotid. Calcified and noncalcified plaque at the ICA origin causing less than 50% stenosis. Probable small area of ulcerated plaque at the common carotid bifurcation. There is also additional mild atherosclerotic irregularity along remainder of the cervical ICA. Left carotid system: Patent. Eccentric noncalcified plaque along the common carotid. Primarily noncalcified plaque at the ICA origin causing approximately 50% stenosis. Vertebral arteries: Patent. Skeleton: Postoperative changes of prior anterior fusion at C5-C7. Multilevel cervical spine degenerative changes. Other neck: No mass or adenopathy. Upper chest: No apical lung mass. Review of the MIP images confirms the above findings CTA HEAD Anterior circulation: Intracranial  internal carotid arteries are patent. There is noncalcified plaque along the right petrous segment causing severe stenosis. Mild calcified plaque along cavernous and paraclinoid portions bilaterally causes mild stenosis. Anterior and middle cerebral arteries are patent. Posterior circulation: Intracranial vertebral arteries are patent with calcified and noncalcified plaque causing to moderate stenosis on the left. Basilar artery is patent. Posterior cerebral arteries are patent. There is atherosclerotic irregularity with multifocal stenosis along the right posterior cerebral artery including severe stenosis near the right P1-P2 junction. Venous sinuses: Patent as allowed by contrast bolus timing. Review of the MIP images confirms the above findings IMPRESSION: No acute intracranial hemorrhage. Small infarct better visible on prior MRI. Calcified and noncalcified plaque at the ICA origins causing up to 50% stenosis. Noncalcified plaque along the petrous segment of the intracranial right ICA causes severe stenosis. Right PCA stenosis including severe narrowing near the right P1-P2 junction. Electronically Signed   By: Macy Mis M.D.   On: 11/09/2019 17:46   DG Chest 2 View  Result Date: 11/09/2019 CLINICAL DATA:  Left-sided numbness and weakness for 1 day, stroke EXAM: CHEST - 2 VIEW COMPARISON:  11/29/2012 FINDINGS: The heart size and mediastinal contours are within normal limits. Both lungs are clear. The visualized skeletal structures are unremarkable. IMPRESSION: No active cardiopulmonary disease. Electronically Signed   By: Randa Ngo M.D.   On: 11/09/2019 18:26   DG Elbow Complete Left  Result Date: 11/09/2019 CLINICAL DATA:  [place] last evening, numbness and pain at left elbow EXAM: LEFT ELBOW - COMPLETE 3+ VIEW COMPARISON:  None. FINDINGS: Frontal, bilateral oblique, and lateral views of the left elbow are obtained. No fracture, subluxation, or dislocation. Joint spaces are well preserved. No  joint effusion. IMPRESSION: 1. Unremarkable left elbow. Electronically Signed   By: Randa Ngo M.D.   On: 11/09/2019 15:45   CT HEAD WO CONTRAST  Result Date: 11/09/2019 CLINICAL DATA:  Mental status changes.  Fall. EXAM: CT HEAD WITHOUT CONTRAST TECHNIQUE: Contiguous axial images were obtained from the base of the skull through the vertex without intravenous contrast. COMPARISON:  04/10/2013 FINDINGS: Brain: There is no evidence for acute hemorrhage, hydrocephalus, mass lesion, or abnormal extra-axial fluid collection. No definite CT evidence for acute infarction. Diffuse loss of parenchymal volume is consistent with atrophy. Patchy low attenuation in the deep hemispheric and periventricular white matter is nonspecific, but likely reflects chronic microvascular ischemic demyelination. Vascular: No hyperdense vessel or unexpected calcification. Skull: No evidence for fracture. No worrisome lytic or sclerotic lesion. Sinuses/Orbits: The visualized paranasal sinuses and mastoid air cells are clear. Visualized portions of the globes and intraorbital fat are unremarkable. Other: None. IMPRESSION: 1. No acute intracranial abnormality. 2. Atrophy with chronic small vessel white matter ischemic disease. Electronically Signed  By: Misty Stanley M.D.   On: 11/09/2019 10:28   CT ANGIO NECK W OR WO CONTRAST  Result Date: 11/09/2019 CLINICAL DATA:  Dizziness, fall, small infarct on MRI EXAM: CT ANGIOGRAPHY HEAD AND NECK TECHNIQUE: Multidetector CT imaging of the head and neck was performed using the standard protocol during bolus administration of intravenous contrast. Multiplanar CT image reconstructions and MIPs were obtained to evaluate the vascular anatomy. Carotid stenosis measurements (when applicable) are obtained utilizing NASCET criteria, using the distal internal carotid diameter as the denominator. CONTRAST:  58mL OMNIPAQUE IOHEXOL 350 MG/ML SOLN COMPARISON:  None. FINDINGS: CT HEAD Brain: There is no  acute intracranial hemorrhage. Small infarct on MRI is not visible. No new loss of gray-white differentiation. Small chronic infarcts of the left lentiform nucleus/subinsular white matter. Ventricles are stable in size. Vascular: No new finding. Skull: Calvarium is unremarkable. Sinuses/Orbits: No acute finding. Other: None. Review of the MIP images confirms the above findings CTA NECK Aortic arch: Mild calcified plaque along the arch and patent great vessel origins. Right carotid system: Patent. There is atherosclerotic irregularity of the common carotid. Calcified and noncalcified plaque at the ICA origin causing less than 50% stenosis. Probable small area of ulcerated plaque at the common carotid bifurcation. There is also additional mild atherosclerotic irregularity along remainder of the cervical ICA. Left carotid system: Patent. Eccentric noncalcified plaque along the common carotid. Primarily noncalcified plaque at the ICA origin causing approximately 50% stenosis. Vertebral arteries: Patent. Skeleton: Postoperative changes of prior anterior fusion at C5-C7. Multilevel cervical spine degenerative changes. Other neck: No mass or adenopathy. Upper chest: No apical lung mass. Review of the MIP images confirms the above findings CTA HEAD Anterior circulation: Intracranial internal carotid arteries are patent. There is noncalcified plaque along the right petrous segment causing severe stenosis. Mild calcified plaque along cavernous and paraclinoid portions bilaterally causes mild stenosis. Anterior and middle cerebral arteries are patent. Posterior circulation: Intracranial vertebral arteries are patent with calcified and noncalcified plaque causing to moderate stenosis on the left. Basilar artery is patent. Posterior cerebral arteries are patent. There is atherosclerotic irregularity with multifocal stenosis along the right posterior cerebral artery including severe stenosis near the right P1-P2 junction. Venous  sinuses: Patent as allowed by contrast bolus timing. Review of the MIP images confirms the above findings IMPRESSION: No acute intracranial hemorrhage. Small infarct better visible on prior MRI. Calcified and noncalcified plaque at the ICA origins causing up to 50% stenosis. Noncalcified plaque along the petrous segment of the intracranial right ICA causes severe stenosis. Right PCA stenosis including severe narrowing near the right P1-P2 junction. Electronically Signed   By: Macy Mis M.D.   On: 11/09/2019 17:46   MR BRAIN WO CONTRAST  Result Date: 11/09/2019 CLINICAL DATA:  Right facial droop, left arm numbness EXAM: MRI HEAD WITHOUT CONTRAST TECHNIQUE: Multiplanar, multiecho pulse sequences of the brain and surrounding structures were obtained without intravenous contrast. COMPARISON:  Correlation made with prior CT imaging. FINDINGS: Motion artifact is present Brain: There is a 1.5 cm focus of reduced diffusion in the region of the posterior limb of the right internal capsule. Prominence of the ventricles and sulci reflects parenchymal volume loss. Patchy T2 hyperintensity in the supratentorial white matter is nonspecific but reflect mild chronic microvascular ischemic changes. Chronic small vessel infarcts of the posterior left nucleus and subinsular white matter. Susceptibility along the left cerebral peduncle likely reflects chronic blood products, mineralization, or cavernous malformation. There is no intracranial mass, mass effect, or edema.  There is no hydrocephalus or extra-axial fluid collection. Vascular: Major vessel flow voids at the skull base are preserved. Skull and upper cervical spine: Normal marrow signal is preserved. Sinuses/Orbits: Paranasal sinuses are aerated. Orbits are unremarkable. Other: Sella is unremarkable.  Mastoid air cells are clear. IMPRESSION: Small acute infarct in the region of the posterior limb of the right internal capsule. Chronic findings detailed above.  Electronically Signed   By: Macy Mis M.D.   On: 11/09/2019 15:16   ECHOCARDIOGRAM COMPLETE  Result Date: 11/10/2019    ECHOCARDIOGRAM REPORT   Patient Name:   Francis Sims. Date of Exam: 11/10/2019 Medical Rec #:  [id number]           Height:       74.0 in Accession #:    [id number]          Weight:       195.0 lb Date of Birth:  1940-12-05           BSA:          2.150 m Patient Age:    87 years            BP:           195/106 mmHg Patient Gender: M                   HR:           74 bpm. Exam Location:  [facility] Procedure: 2D Echo, Cardiac Doppler and Color Doppler Indications:     Stroke 434.91  History:         Patient has no prior history of Echocardiogram examinations.                  Risk Factors:Hypertension.  Sonographer:     Sherrie Sport RDCS (AE) Referring Phys:  [id number] [facility] Diagnosing Phys: Ida Rogue MD  Sonographer Comments: Suboptimal parasternal window and no apical window. IMPRESSIONS  1. Left ventricular ejection fraction, by estimation, is 60 to 65%. The left ventricle has normal function. The left ventricle has no regional wall motion abnormalities. There is mild left ventricular hypertrophy. Left ventricular diastolic parameters are indeterminate.  2. Right ventricular systolic function is normal. The right ventricular size is normal. FINDINGS  Left Ventricle: Left ventricular ejection fraction, by estimation, is 60 to 65%. The left ventricle has normal function. The left ventricle has no regional wall motion abnormalities. The left ventricular internal cavity size was normal in size. There is  mild left ventricular hypertrophy. Left ventricular diastolic parameters are indeterminate. Right Ventricle: The right ventricular size is normal. No increase in right ventricular wall thickness. Right ventricular systolic function is normal. Left Atrium: Left atrial size was normal in size. Right Atrium: Right atrial size was normal in size. Pericardium: There is no evidence of  pericardial effusion. Mitral Valve: The mitral valve is normal in structure. Normal mobility of the mitral valve leaflets. No evidence of mitral valve regurgitation. No evidence of mitral valve stenosis. Tricuspid Valve: The tricuspid valve is normal in structure. Tricuspid valve regurgitation is not demonstrated. No evidence of tricuspid stenosis. Aortic Valve: The aortic valve was not well visualized. Aortic valve regurgitation is not visualized. Mild aortic valve sclerosis is present, with no evidence of aortic valve stenosis. Pulmonic Valve: The pulmonic valve was normal in structure. Pulmonic valve regurgitation is not visualized. No evidence of pulmonic stenosis. Aorta: The aortic root is normal in size and structure. Venous: The inferior vena  cava is normal in size with greater than 50% respiratory variability, suggesting right atrial pressure of 3 mmHg. IAS/Shunts: No atrial level shunt detected by color flow Doppler.  LEFT VENTRICLE PLAX 2D LVIDd:         4.66 cm LVIDs:         3.02 cm LV PW:         1.26 cm LV IVS:        1.31 cm LVOT diam:     2.00 cm LVOT Area:     3.14 cm  LEFT ATRIUM         Index LA diam:    3.00 cm 1.40 cm/m                        PULMONIC VALVE AORTA                 PV Vmax:        0.99 m/s Ao Root diam: 3.00 cm PV Peak grad:   3.9 mmHg                       RVOT Peak grad: 2 mmHg   SHUNTS Systemic Diam: 2.00 cm Ida Rogue MD Electronically signed by Ida Rogue MD Signature Date/Time: 11/10/2019/4:15:43 PM    Final         Scheduled Meds: .  stroke: mapping our early stages of recovery book   Does not apply Once  . vitamin C  250 mg Oral Daily  . [START ON 11/11/2019] aspirin  81 mg Oral Daily  . cholecalciferol  1,000 Units Oral Daily  . clopidogrel  75 mg Oral Daily  . enoxaparin (LOVENOX) injection  40 mg Subcutaneous Q24H  . loratadine  10 mg Oral Daily  . melatonin  5 mg Oral QHS  . olopatadine  1 drop Both Eyes BID  . omega-3 acid ethyl esters  1 g  Oral Daily  . tamsulosin  0.4 mg Oral Daily   Continuous Infusions: . sodium chloride 50 mL/hr at 11/10/19 0651     LOS: 1 day    Time spent: 25 min    Deacon Gadbois, MD [facility]   To contact the attending provider between 7A-7P or the covering provider during after hours 7P-7A, please log into the web site [email or web address] and access using universal [place] password for that web site. If you do not have the password, please call the hospital operator.  11/10/2019, 4:29 PM

## 2019-11-10 NOTE — Progress Notes (Signed)
Rehab Admissions Coordinator Note:  Per PT recommendation, this patient was screened by Raechel Ache for appropriateness for an Inpatient Acute Rehab Consult.  At this time, we are recommending an Inpatient Rehab consult. AC will place consult order in the chart per protocol.   Raechel Ache 11/10/2019, 2:52 PM  I can be reached at [phone].

## 2019-11-10 NOTE — ED Notes (Signed)
Admitting MD at bedside for eval.

## 2019-11-10 NOTE — Evaluation (Signed)
Physical Therapy Evaluation Patient Details Name: Francis Sims. MRN: [id number] DOB: 07-01-1940 Today's Date: 11/10/2019   History of Present Illness  presented to ER secondary to acute onset of L UE weakness/numbness; admitted for TIA/CVA work -up.  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  Clinical Impression  Upon evaluation, patient alert and oriented; follows commands and demonstrates excellent effort with mobility assessment throughout session.  Patient very eager, motivated to participate and progress with therapy; hopeful for recovery of maximal independence.  Patient presents with mild weakness (4- to 4/5), mod coordination/motor control deficits (LE > UE), denies sensory deficits throughout L hemi-body. Significant balance deficits evident, both with sitting and standing balance/mobility tasks.  Tends to list to L posterior/lateral direction, progressing to mild/mod pushing behaviors with increased challenge/fatigue.  Currently requiring min/mod assist for bed mobility; sup/min assist for unsupported sitting balance; mod assist +1 for sit/stand and mod assist +2 for gait (40') with bilat HHA.  Demonstrates step to gait pattern leading with L LE, inconsistent L foot placement/control with increased force of contact, increased adduct at initial contact.  Heavy L lateral lean with dynamic gait efforts, mild pushing at times.  Constant manual facilitation for R ant/lateral weight shift, balance and overall safety.  Encouraged for smaller, slower steps with L LE to optimize control; requiring consistent cuing to maintain.  Patient with consistently strong efforts to integrate cuing and education; difficulty maintaining outside of massed practice opportunities at this time. Would benefit from skilled PT to address above deficits and promote optimal return to PLOF.; recommend transition to acute inpatient rehab upon discharge for high-intensity, post-acute rehab services.  Anticipate  excellent effort, progression and overall functional recovery with appropriate rehab interventions.      Follow Up Recommendations CIR    Equipment Recommendations  Rolling walker with 5" wheels;3in1 (PT)    Recommendations for Other Services       Precautions / Restrictions Precautions Precautions: Fall Restrictions Weight Bearing Restrictions: No      Mobility  Bed Mobility Overal bed mobility: Needs Assistance Bed Mobility: Supine to Sit     Supine to sit: Min assist     General bed mobility comments: multiple attempts, heavy use of momentum; difficulty coordinating L hemi-body to initiate/complete anterior weight translation at times  Transfers Overall transfer level: Needs assistance Equipment used: 1 person hand held assist Transfers: Sit to/from Stand Sit to Stand: Mod assist         General transfer comment: assist for lift off, midline orientation in both A/P and M/L planes.  Tendancy towards R post/lateral lean, mild pushing behaviors with increased fatigue.  Ambulation/Gait Ambulation/Gait assistance: Mod assist;+2 physical assistance Gait Distance (Feet): 40 Feet Assistive device: 2 person hand held assist       General Gait Details: step to gait pattern leading with L LE, inconsistent L foot placement/control with increased force of contact, increased adduct at initial contact.  Heavy L lateral lean with dynamic gait efforts, mild pushing at times.  Constant manual facilitation for R ant/lateral weight shift, balance and overall safety.  Encouraged for smaller, slower steps with L LE to optimize control; requiring consistent cuing to maintain.  Stairs            Wheelchair Mobility    Modified Rankin (Stroke Patients Only)       Balance Overall balance assessment: Needs assistance Sitting-balance support: No upper extremity supported;Feet supported Sitting balance-Leahy Scale: Fair Sitting balance - Comments: L posterior/lateral weight  shift; close  for static sitting, min/mod assist for dynamic sitting (esp with fatigue)   Standing balance support: No upper extremity supported Standing balance-Leahy Scale: Poor Standing balance comment: L post/lateral weight shift, intermittent pushing as fatigue increases                             Pertinent Vitals/Pain Pain Assessment: No/denies pain    Home Living Family/patient expects to be discharged to:: Private residence Living Arrangements: Spouse/significant other Available Help at Discharge: Family;Available 24 hours/day Type of Home: House Home Access: Stairs to enter Entrance Stairs-Rails: None Entrance Stairs-Number of Steps: 1 Home Layout: Two level;Bed/bath upstairs;Able to live on main level with bedroom/bathroom Home Equipment: None      Prior Function Level of Independence: Independent         Comments: Indep with ADL, functional mobility w/o AD; very active, plays tennis, pickle ball and goes to the gym; still drives; 4 falls reported in the last 6 months     Hand Dominance   Dominant Hand: Right    Extremity/Trunk Assessment   Upper Extremity Assessment Upper Extremity Assessment: (L UE grossly 4/5 throughout, mild deficits in coordination and fine motor control)    Lower Extremity Assessment Lower Extremity Assessment: (L LE grossly 4/5 throughout; moderately ataxic with foot placement, mod deficits in coordination and overall motor control.  Denies sensory deficit)       Communication   Communication: No difficulties  Cognition Arousal/Alertness: Awake/alert Behavior During Therapy: WFL for tasks assessed/performed Overall Cognitive Status: Within Functional Limits for tasks assessed                                 General Comments: motivated to participate in therapy, strong willed and eager to begin recovery      General Comments      Exercises Other Exercises Other Exercises: Sit/stand x6 without  assist device, mod progressing to min assist-emphasis on bilat LE foot placement, symmetrical WBing, forward trunk lean and anterior weight translation/balance control.  Good efforts to integrate cuing with noted improvement in performance; difficulty maintaining/replicating outside of massed practice trials. Other Exercises: Unsupported standing balance, worked on developing awareness of midline position and initiation of self-righting.  With isolated focus, able to initiate correction approx 50% time. Other Exercises: Rolling bilat, min/mod assist to coordinate L hemi-body and initiate/complete rotation; heavy use of bedrails to assist. min/mod assist to don/doff lower body clothign due to incontinent bladder episode.   Assessment/Plan    PT Assessment Patient needs continued PT services  PT Problem List Decreased strength;Decreased range of motion;Decreased activity tolerance;Decreased balance;Decreased mobility;Decreased coordination;Decreased cognition;Decreased knowledge of use of DME;Decreased safety awareness;Decreased knowledge of precautions       PT Treatment Interventions DME instruction;Gait training;Stair training;Functional mobility training;Therapeutic activities;Therapeutic exercise;Balance training;Neuromuscular re-education;Cognitive remediation;Patient/family education    PT Goals (Current goals can be found in the Care Plan section)  Acute Rehab PT Goals Patient Stated Goal: "to regain as much independence as I can" PT Goal Formulation: With patient/family Time For Goal Achievement: 11/24/19 Potential to Achieve Goals: Good    Frequency 7X/week   Barriers to discharge Decreased caregiver support      Co-evaluation               AM-PAC PT "6 Clicks" Mobility  Outcome Measure Help needed turning from your back to your side while in a flat bed without  using bedrails?: A Little Help needed moving from lying on your back to sitting on the side of a flat bed  without using bedrails?: A Little Help needed moving to and from a bed to a chair (including a wheelchair)?: A Lot Help needed standing up from a chair using your arms (e.g., wheelchair or bedside chair)?: A Lot Help needed to walk in hospital room?: A Lot Help needed climbing 3-5 steps with a railing? : A Lot 6 Click Score: 14    End of Session Equipment Utilized During Treatment: Gait belt Activity Tolerance: Patient tolerated treatment well Patient left: in bed;with call bell/phone within reach Nurse Communication: Mobility status PT Visit Diagnosis: Difficulty in walking, not elsewhere classified (R26.2);Hemiplegia and hemiparesis Hemiplegia - Right/Left: Left Hemiplegia - dominant/non-dominant: Non-dominant Hemiplegia - caused by: Cerebral infarction    Time: [id number] PT Time Calculation (min) (ACUTE ONLY): 53 min   Charges:   PT Evaluation $PT Eval Moderate Complexity: 1 Mod PT Treatments $Therapeutic Activity: 8-22 mins $Neuromuscular Re-education: 23-37 mins       Lexey Fletes H. Owens Shark, PT, DPT, NCS 11/10/19, 2:44 PM [phone]

## 2019-11-10 NOTE — ED Notes (Signed)
Pt ate 75% breakfast tray. Waiting on neuro and PT consult.

## 2019-11-10 NOTE — Progress Notes (Signed)
SLP Cancellation Note  Patient Details Name: Francis Sims. MRN: [id number] DOB: 02/25/1941   Cancelled treatment:       Reason Eval/Treat Not Completed: SLP screened, no needs identified, will sign off   Pt is A&O x 4 with no deficits in cognitive linguistic abilities.   [name] B. Rutherford Nail M.S., CCC-SLP, [place] Office [phone]    [name] 11/10/2019, 8:59 AM

## 2019-11-10 NOTE — Consult Note (Signed)
Requesting Physician: Dhungel    Chief Complaint: Left sided numbness and weakness  I have been asked by Dr. Clementeen Graham to see this patient in consultation for acute infarct.  HPI: Francis Sims. is an 79 y.o. male with medical history significant of GERD, situational mixed anxiety and depression, elevated blood pressure diagnosed at [facility] hypertension, BPH edema and prediabetes (A1c of 6.3 based on last lab) who presented to the ED with 1 day history of left-sided weakness and numbness.  Patient reports being fine until the evening of 5/26 when he started having some weakness in his left lower leg and left forearm and hand associated with numbness mainly in the left leg and left hand.  Woke up during th night multiple times and appeared back to baseline.  Yesterday morning he woke up and had similar symptoms, feeling dizzy and unsteady on his feet.  Presented for evaluation at that time.  Initial NIHSS of 6. As per wife patient has had 4 falls at home in the past 6 months without any loss of consciousness, focal weaknesses or injuries.    Date last known well: 11/09/2019 Time last known well: Time: 05:00 tPA Given: No: Outside time window  Past Medical History:  Diagnosis Date  . Burping    per pt lots of burping  . Chronic back pain    pinched nerve;buldging disc  . Dry skin    on elbows;uses vasaline and it goes away  . GERD (gastroesophageal reflux disease)    OTC  . Hemorrhoids   . Hx of colonic polyps    40yrs ago  . Hypertension   . Nocturia   . PONV (postoperative nausea and vomiting)     Past Surgical History:  Procedure Laterality Date  . BACK SURGERY    . BROW LIFT Right 07/12/2013   Procedure: CONTRACTURE  RELEASE ZPLASTY OF RIGHT EYE, PERIORBITAL AREA WITH REPAIR OF PTOSIS OF RIGHT EYE BROW;  Surgeon: Theodoro Kos, DO;  Location: [facility];  Service: Plastics;  Laterality: Right;  . CATARACT EXTRACTION  2008   bilateral  . CERVICAL FUSION  2000    C5,6,7  . COLONOSCOPY    . EYE SURGERY    . HERNIA REPAIR  1949  . LUMBAR LAMINECTOMY/DECOMPRESSION MICRODISCECTOMY  05/26/2011   Procedure: LUMBAR LAMINECTOMY/DECOMPRESSION MICRODISCECTOMY;  Surgeon: Olga Coaster Kritzer;  Location: [facility];  Service: Neurosurgery;  Laterality: Right;  Right Lumbar three-four Microdiskectomy  . TONSILLECTOMY      Family History  Problem Relation Age of Onset  . COPD Other   . Anesthesia problems Neg Hx   . Hypotension Neg Hx   . Malignant hyperthermia Neg Hx   . Pseudochol deficiency Neg Hx    Social History:  reports that he has quit smoking. He has never used smokeless tobacco. He reports that he does not drink alcohol or use drugs.  Allergies:  Allergies  Allergen Reactions  . Penicillins Hives and Swelling  . Sulfonamide Derivatives Other (See Comments)    Almost stopped heart in the service..Also, causes hives  . Hydroxyzine     Too strong  . Ibuprofen Itching  . Statins     aches    Medications: I have reviewed the patient's current medications. Prior to Admission medications   Medication Sig Start Date End Date Taking? Authorizing Provider  cholecalciferol (VITAMIN D) 1000 UNITS tablet Take 1,000 Units by mouth daily.     Yes [provider]  nortriptyline (PAMELOR) 10 MG  capsule Take 1-2 capsules (10-20 mg total) by mouth at bedtime. 10/05/19  Yes Plotnikov, Evie Lacks, MD  Omega-3 Fatty Acids (FISH OIL) 1000 MG CAPS Take 1 capsule by mouth 2 (two) times daily.     Yes [provider]  omeprazole (PRILOSEC) 40 MG capsule TAKE ONE CAPSULE BY MOUTH EVERY DAY AS NEEDED Patient taking differently: Take 40 mg by mouth daily as needed.  05/01/19  Yes Plotnikov, Evie Lacks, MD  tamsulosin (FLOMAX) 0.4 MG CAPS capsule Take 1 capsule (0.4 mg total) by mouth daily. 06/15/19  Yes Plotnikov, Evie Lacks, MD  vitamin C (ASCORBIC ACID) 250 MG tablet Take 250 mg by mouth daily.   Yes [provider]  amLODipine (NORVASC) 5  MG tablet Take 1 tablet (5 mg total) by mouth daily. Patient not taking: Reported on 09/27/2019 06/29/19 06/28/20  Plotnikov, Evie Lacks, MD  aspirin EC 81 MG tablet Take 81 mg by mouth daily.    [provider]  cetirizine (ZYRTEC ALLERGY) 10 MG tablet Take 1 tablet (10 mg total) by mouth daily. 11/23/18 11/23/19  Plotnikov, Evie Lacks, MD  LORazepam (ATIVAN) 0.5 MG tablet Take 1 tablet (0.5 mg total) by mouth 2 (two) times daily as needed for anxiety. 06/15/19   Plotnikov, Evie Lacks, MD  olopatadine (PATANOL) 0.1 % ophthalmic solution Place 1 drop into both eyes 2 (two) times daily. 11/23/18 11/23/19  Plotnikov, Evie Lacks, MD  vardenafil (LEVITRA) 20 MG tablet Take 1 tablet (20 mg total) by mouth daily as needed for erectile dysfunction. 12/18/16   Plotnikov, Evie Lacks, MD    ROS: History obtained from the patient  General ROS: negative for - chills, fatigue, fever, night sweats, weight gain or weight loss Psychological ROS: negative for - behavioral disorder, hallucinations, memory difficulties, mood swings or suicidal ideation Ophthalmic ROS: negative for - blurry vision, double vision, eye pain or loss of vision ENT ROS: negative for - epistaxis, nasal discharge, oral lesions, sore throat, tinnitus or vertigo Allergy and Immunology ROS: negative for - hives or itchy/watery eyes Hematological and Lymphatic ROS: negative for - bleeding problems, bruising or swollen lymph nodes Endocrine ROS: negative for - galactorrhea, hair pattern changes, polydipsia/polyuria or temperature intolerance Respiratory ROS: negative for - cough, hemoptysis, shortness of breath or wheezing Cardiovascular ROS: negative for - chest pain, dyspnea on exertion, edema or irregular heartbeat Gastrointestinal ROS: negative for - abdominal pain, diarrhea, hematemesis, nausea/vomiting or stool incontinence Genito-Urinary ROS: negative for - dysuria, hematuria, incontinence or urinary frequency/urgency Musculoskeletal ROS:  negative for - joint swelling or muscular weakness Neurological ROS: as noted in HPI Dermatological ROS: negative for rash and skin lesion changes  Physical Examination: Blood pressure (!) 194/86, pulse 80, temperature 97.7 F (36.5 C), temperature source Oral, resp. rate (!) 21, height 6\' 2"  (1.88 m), weight 88.5 kg, SpO2 94 %.  HEENT-  Normocephalic, no lesions, without obvious abnormality.  Normal external eye and conjunctiva.  Normal TM's bilaterally.  Normal auditory canals and external ears. Normal external nose, mucus membranes and septum.  Normal pharynx. Cardiovascular- S1, S2 normal, pulses palpable throughout   Lungs- chest clear, no wheezing, rales, normal symmetric air entry Abdomen- soft, non-tender; bowel sounds normal; no masses,  no organomegaly Extremities- no edema Lymph-no adenopathy palpable Musculoskeletal-no joint tenderness, deformity or swelling Skin-warm and dry, no hyperpigmentation, vitiligo, or suspicious lesions  Neurological Examination   Mental Status: Alert, oriented, thought content appropriate.  Speech fluent without evidence of aphasia.  Able to follow 3 step commands  without difficulty. Cranial Nerves: II: Discs flat bilaterally; Visual fields grossly normal, pupils equal, round, reactive to light and accommodation III,IV, VI: ptosis not present, extra-ocular motions intact bilaterally V,VII: smile symmetric, facial light touch sensation decreased on the left VIII: hearing normal bilaterally IX,X: gag reflex present XI: bilateral shoulder shrug XII: midline tongue extension Motor: Right : Upper extremity   5/5    Left:     Upper extremity   5/5  Lower extremity   5/5     Lower extremity   5/5 Tone and bulk:normal tone throughout; no atrophy noted Sensory: Pinprick and light touch decreased in the LUE and LLE Deep Tendon Reflexes: Symmetric throughout Plantars: Right: downgoing   Left: downgoing Cerebellar: Normal finger-to-nose testing  bilaterally.  Dysmetria with heel-to-shin testing using the LLE Gait: not tested due to safety concerns    Laboratory Studies:  Basic Metabolic Panel: Recent Labs  Lab 11/09/19 0958  NA 139  K 3.9  CL 104  CO2 26  GLUCOSE 144*  BUN 19  CREATININE 0.75  CALCIUM 8.9  MG 2.0    Liver Function Tests: Recent Labs  Lab 11/09/19 0958  AST 26  ALT 24  ALKPHOS 85  BILITOT 1.6*  PROT 8.2*  ALBUMIN 4.6   No results for input(s): LIPASE, AMYLASE in the last 168 hours. No results for input(s): AMMONIA in the last 168 hours.  CBC: Recent Labs  Lab 11/09/19 0958  WBC 8.6  NEUTROABS 5.9  HGB 16.3  HCT 47.8  MCV 83.1  PLT 231    Cardiac Enzymes: No results for input(s): CKTOTAL, CKMB, CKMBINDEX, TROPONINI in the last 168 hours.  BNP: Invalid input(s): POCBNP  CBG: Recent Labs  Lab 11/09/19 1003  GLUCAP 145*    Microbiology: Results for orders placed or performed during the hospital encounter of 11/09/19  SARS Coronavirus 2 by RT PCR (hospital order, performed in [facility] hospital lab) Nasopharyngeal Nasopharyngeal Swab     Status: None   Collection Time: 11/09/19  4:45 PM   Specimen: Nasopharyngeal Swab  Result Value Ref Range Status   SARS Coronavirus 2 NEGATIVE NEGATIVE Final    Comment: (NOTE) SARS-CoV-2 target nucleic acids are NOT DETECTED. The SARS-CoV-2 RNA is generally detectable in upper and lower respiratory specimens during the acute phase of infection. The lowest concentration of SARS-CoV-2 viral copies this assay can detect is 250 copies / mL. A negative result does not preclude SARS-CoV-2 infection and should not be used as the sole basis for treatment or other patient management decisions.  A negative result may occur with improper specimen collection / handling, submission of specimen other than nasopharyngeal swab, presence of viral mutation(s) within the areas targeted by this assay, and inadequate number of viral copies (<250 copies /  mL). A negative result must be combined with clinical observations, patient history, and epidemiological information. Fact Sheet for Patients:   StrictlyIdeas.no Fact Sheet for Healthcare Providers: BankingDealers.co.za This test is not yet approved or cleared  by the [place] FDA and has been authorized for detection and/or diagnosis of SARS-CoV-2 by FDA under an Emergency Use Authorization (EUA).  This EUA will remain in effect (meaning this test can be used) for the duration of the COVID-19 declaration under Section 564(b)(1) of the Act, 21 U.S.C. section 360bbb-3(b)(1), unless the authorization is terminated or revoked sooner. Performed at [facility], [address] [place], [place] [address]     Coagulation Studies: Recent Labs    11/09/19 [phone]  LABPROT 12.5  INR 1.0    Urinalysis: No results for input(s): COLORURINE, LABSPEC, PHURINE, GLUCOSEU, HGBUR, BILIRUBINUR, KETONESUR, PROTEINUR, UROBILINOGEN, NITRITE, LEUKOCYTESUR in the last 168 hours.  Invalid input(s): APPERANCEUR  Lipid Panel:    Component Value Date/Time   CHOL 185 11/10/2019 0644   TRIG 78 11/10/2019 0644   HDL 34 (L) 11/10/2019 0644   CHOLHDL 5.4 11/10/2019 0644   VLDL 16 11/10/2019 0644   LDLCALC 135 (H) 11/10/2019 0644    HgbA1C:  Lab Results  Component Value Date   HGBA1C 6.7 (H) 11/09/2019    Urine Drug Screen:  No results found for: LABOPIA, COCAINSCRNUR, LABBENZ, AMPHETMU, THCU, LABBARB  Alcohol Level: No results for input(s): ETH in the last 168 hours.  Other results: EKG: sinus rhythm at 72 bpm.  Imaging: CT ANGIO HEAD W OR WO CONTRAST  Result Date: 11/09/2019 CLINICAL DATA:  Dizziness, fall, small infarct on MRI EXAM: CT ANGIOGRAPHY HEAD AND NECK TECHNIQUE: Multidetector CT imaging of the head and neck was performed using the standard protocol during bolus administration of intravenous contrast. Multiplanar CT image  reconstructions and MIPs were obtained to evaluate the vascular anatomy. Carotid stenosis measurements (when applicable) are obtained utilizing NASCET criteria, using the distal internal carotid diameter as the denominator. CONTRAST:  60mL OMNIPAQUE IOHEXOL 350 MG/ML SOLN COMPARISON:  None. FINDINGS: CT HEAD Brain: There is no acute intracranial hemorrhage. Small infarct on MRI is not visible. No new loss of gray-white differentiation. Small chronic infarcts of the left lentiform nucleus/subinsular white matter. Ventricles are stable in size. Vascular: No new finding. Skull: Calvarium is unremarkable. Sinuses/Orbits: No acute finding. Other: None. Review of the MIP images confirms the above findings CTA NECK Aortic arch: Mild calcified plaque along the arch and patent great vessel origins. Right carotid system: Patent. There is atherosclerotic irregularity of the common carotid. Calcified and noncalcified plaque at the ICA origin causing less than 50% stenosis. Probable small area of ulcerated plaque at the common carotid bifurcation. There is also additional mild atherosclerotic irregularity along remainder of the cervical ICA. Left carotid system: Patent. Eccentric noncalcified plaque along the common carotid. Primarily noncalcified plaque at the ICA origin causing approximately 50% stenosis. Vertebral arteries: Patent. Skeleton: Postoperative changes of prior anterior fusion at C5-C7. Multilevel cervical spine degenerative changes. Other neck: No mass or adenopathy. Upper chest: No apical lung mass. Review of the MIP images confirms the above findings CTA HEAD Anterior circulation: Intracranial internal carotid arteries are patent. There is noncalcified plaque along the right petrous segment causing severe stenosis. Mild calcified plaque along cavernous and paraclinoid portions bilaterally causes mild stenosis. Anterior and middle cerebral arteries are patent. Posterior circulation: Intracranial vertebral  arteries are patent with calcified and noncalcified plaque causing to moderate stenosis on the left. Basilar artery is patent. Posterior cerebral arteries are patent. There is atherosclerotic irregularity with multifocal stenosis along the right posterior cerebral artery including severe stenosis near the right P1-P2 junction. Venous sinuses: Patent as allowed by contrast bolus timing. Review of the MIP images confirms the above findings IMPRESSION: No acute intracranial hemorrhage. Small infarct better visible on prior MRI. Calcified and noncalcified plaque at the ICA origins causing up to 50% stenosis. Noncalcified plaque along the petrous segment of the intracranial right ICA causes severe stenosis. Right PCA stenosis including severe narrowing near the right P1-P2 junction. Electronically Signed   By: Macy Mis M.D.   On: 11/09/2019 17:46   DG Chest 2 View  Result Date: 11/09/2019 CLINICAL DATA:  Left-sided numbness and weakness for 1 day, stroke EXAM: CHEST - 2 VIEW COMPARISON:  11/29/2012 FINDINGS: The heart size and mediastinal contours are within normal limits. Both lungs are clear. The visualized skeletal structures are unremarkable. IMPRESSION: No active cardiopulmonary disease. Electronically Signed   By: Randa Ngo M.D.   On: 11/09/2019 18:26   DG Elbow Complete Left  Result Date: 11/09/2019 CLINICAL DATA:  [place] last evening, numbness and pain at left elbow EXAM: LEFT ELBOW - COMPLETE 3+ VIEW COMPARISON:  None. FINDINGS: Frontal, bilateral oblique, and lateral views of the left elbow are obtained. No fracture, subluxation, or dislocation. Joint spaces are well preserved. No joint effusion. IMPRESSION: 1. Unremarkable left elbow. Electronically Signed   By: Randa Ngo M.D.   On: 11/09/2019 15:45   CT HEAD WO CONTRAST  Result Date: 11/09/2019 CLINICAL DATA:  Mental status changes.  Fall. EXAM: CT HEAD WITHOUT CONTRAST TECHNIQUE: Contiguous axial images were obtained from the base  of the skull through the vertex without intravenous contrast. COMPARISON:  04/10/2013 FINDINGS: Brain: There is no evidence for acute hemorrhage, hydrocephalus, mass lesion, or abnormal extra-axial fluid collection. No definite CT evidence for acute infarction. Diffuse loss of parenchymal volume is consistent with atrophy. Patchy low attenuation in the deep hemispheric and periventricular white matter is nonspecific, but likely reflects chronic microvascular ischemic demyelination. Vascular: No hyperdense vessel or unexpected calcification. Skull: No evidence for fracture. No worrisome lytic or sclerotic lesion. Sinuses/Orbits: The visualized paranasal sinuses and mastoid air cells are clear. Visualized portions of the globes and intraorbital fat are unremarkable. Other: None. IMPRESSION: 1. No acute intracranial abnormality. 2. Atrophy with chronic small vessel white matter ischemic disease. Electronically Signed   By: Misty Stanley M.D.   On: 11/09/2019 10:28   CT ANGIO NECK W OR WO CONTRAST  Result Date: 11/09/2019 CLINICAL DATA:  Dizziness, fall, small infarct on MRI EXAM: CT ANGIOGRAPHY HEAD AND NECK TECHNIQUE: Multidetector CT imaging of the head and neck was performed using the standard protocol during bolus administration of intravenous contrast. Multiplanar CT image reconstructions and MIPs were obtained to evaluate the vascular anatomy. Carotid stenosis measurements (when applicable) are obtained utilizing NASCET criteria, using the distal internal carotid diameter as the denominator. CONTRAST:  8mL OMNIPAQUE IOHEXOL 350 MG/ML SOLN COMPARISON:  None. FINDINGS: CT HEAD Brain: There is no acute intracranial hemorrhage. Small infarct on MRI is not visible. No new loss of gray-white differentiation. Small chronic infarcts of the left lentiform nucleus/subinsular white matter. Ventricles are stable in size. Vascular: No new finding. Skull: Calvarium is unremarkable. Sinuses/Orbits: No acute finding. Other:  None. Review of the MIP images confirms the above findings CTA NECK Aortic arch: Mild calcified plaque along the arch and patent great vessel origins. Right carotid system: Patent. There is atherosclerotic irregularity of the common carotid. Calcified and noncalcified plaque at the ICA origin causing less than 50% stenosis. Probable small area of ulcerated plaque at the common carotid bifurcation. There is also additional mild atherosclerotic irregularity along remainder of the cervical ICA. Left carotid system: Patent. Eccentric noncalcified plaque along the common carotid. Primarily noncalcified plaque at the ICA origin causing approximately 50% stenosis. Vertebral arteries: Patent. Skeleton: Postoperative changes of prior anterior fusion at C5-C7. Multilevel cervical spine degenerative changes. Other neck: No mass or adenopathy. Upper chest: No apical lung mass. Review of the MIP images confirms the above findings CTA HEAD Anterior circulation: Intracranial internal carotid arteries are patent. There is noncalcified plaque along the right petrous segment causing  severe stenosis. Mild calcified plaque along cavernous and paraclinoid portions bilaterally causes mild stenosis. Anterior and middle cerebral arteries are patent. Posterior circulation: Intracranial vertebral arteries are patent with calcified and noncalcified plaque causing to moderate stenosis on the left. Basilar artery is patent. Posterior cerebral arteries are patent. There is atherosclerotic irregularity with multifocal stenosis along the right posterior cerebral artery including severe stenosis near the right P1-P2 junction. Venous sinuses: Patent as allowed by contrast bolus timing. Review of the MIP images confirms the above findings IMPRESSION: No acute intracranial hemorrhage. Small infarct better visible on prior MRI. Calcified and noncalcified plaque at the ICA origins causing up to 50% stenosis. Noncalcified plaque along the petrous segment  of the intracranial right ICA causes severe stenosis. Right PCA stenosis including severe narrowing near the right P1-P2 junction. Electronically Signed   By: Macy Mis M.D.   On: 11/09/2019 17:46   MR BRAIN WO CONTRAST  Result Date: 11/09/2019 CLINICAL DATA:  Right facial droop, left arm numbness EXAM: MRI HEAD WITHOUT CONTRAST TECHNIQUE: Multiplanar, multiecho pulse sequences of the brain and surrounding structures were obtained without intravenous contrast. COMPARISON:  Correlation made with prior CT imaging. FINDINGS: Motion artifact is present Brain: There is a 1.5 cm focus of reduced diffusion in the region of the posterior limb of the right internal capsule. Prominence of the ventricles and sulci reflects parenchymal volume loss. Patchy T2 hyperintensity in the supratentorial white matter is nonspecific but reflect mild chronic microvascular ischemic changes. Chronic small vessel infarcts of the posterior left nucleus and subinsular white matter. Susceptibility along the left cerebral peduncle likely reflects chronic blood products, mineralization, or cavernous malformation. There is no intracranial mass, mass effect, or edema. There is no hydrocephalus or extra-axial fluid collection. Vascular: Major vessel flow voids at the skull base are preserved. Skull and upper cervical spine: Normal marrow signal is preserved. Sinuses/Orbits: Paranasal sinuses are aerated. Orbits are unremarkable. Other: Sella is unremarkable.  Mastoid air cells are clear. IMPRESSION: Small acute infarct in the region of the posterior limb of the right internal capsule. Chronic findings detailed above. Electronically Signed   By: Macy Mis M.D.   On: 11/09/2019 15:16    Assessment: 79 y.o. male with medical history significant of GERD, situational mixed anxiety and depression, elevated blood pressure diagnosed at [facility] hypertension, BPH edema and prediabetes (A1c of 6.3 based on last lab) who presented to the ED  with 1 day history of left-sided weakness and numbness.  MRI of the brain personally reviewed and shows an acute right internal capsular infarct.  Etiology likely small vessel disease.  Patient hypertensive on presentation.  CTA of the head and neck show severe stenosis at right P1-P2 junction but no other areas of significant stenosis.  No evidence of significant vertebrobasilar disease.  Echocardiogram is pending.  A1c 6.7,  LDL 135.   Stroke Risk Factors - diabetes mellitus and hypertension  Plan: 1. Statin for lipid management with target LDL<70. 2. Echocardiogram pending 3. PT consult, OT consult, Speech consult 4. Prophylactic therapy-Dual antiplatelet therapy with ASA 81mg  and Plavix 75mg  for three weeks with change to Plavix 75mg  daily alone as monotherapy after that time. 5. NPO until RN stroke swallow screen 6. Telemetry monitoring 7. Frequent neuro checks 8. BP control with target BP<140/80 9. Follow up with neurology on an outpatient basis after discharge.  Patient wishes to follow up in [facility] and will get referral from their PCP.     Alexis Goodell, MD Neurology [phone] 11/10/2019,  12:36 PM

## 2019-11-10 NOTE — Progress Notes (Signed)
*  PRELIMINARY RESULTS* Echocardiogram 2D Echocardiogram has been performed.  Francis Sims 11/10/2019, 8:12 AM

## 2019-11-10 NOTE — ED Notes (Signed)
Pt requesting something more for his headache that has returned. Pt was given tylenol last night but reports little improvement.

## 2019-11-10 NOTE — Evaluation (Signed)
Occupational Therapy Evaluation Patient Details Name: Francis Sims. MRN: [id number] DOB: 1941/03/24 Today's Date: 11/10/2019    History of Present Illness presented to ER secondary to acute onset of L UE weakness/numbness; admitted for TIA/CVA work -up.  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   Clinical Impression   Pt presents this afternoon awake and alert in bed, very motivated to start with therapy "to get back my independence." Prior to hospitalization, pt was independent in all ADL/IADL, functional mobility without AD, driving and very active. He goes to the gym, plays pickle ball and tennis (2-3x per week up until wife's recent cancer dx) and enjoys spending time with his 2 grandkids. Pt demonstrates 5/5 strength in BUE/BLE, with decreased L sided fine motor coordination and sensation (primarily in L UE). He is Mod A for supine to sit bed mobility with multiple attempts for coordinating L hemi-body shift. Pt was able to sit at EOB ~30 min with initially poor balance and CGA-Min A with posterior/left lateral lean, improving to fair with supervision for both static and dynamic sitting. CGA for initial sit to stand with +2 for safety and Min A +2 for maintaining standing balance, noted posterior/left lateral lean with multiple verbal cues and 1-2 hand held assist for support. Pt became fatigued at ~5 min standing, returned to bed with Min A for sit to supine. No visual or significant cognitive deficits appreciated with assessment. Pt shows deficits in L side [place], sensation and balance (sitting and standing), impacting his ability to engage in ADL, IADL and hobbies. He will benefit from high intensity skilled acute OT services to address these deficits to return to PLOF. Given pt motivation, prior level of function, functional performance and potential for recovery, recommend CIR upon discharge.         Follow Up Recommendations  CIR    Equipment Recommendations  None recommended by  OT    Recommendations for Other Services       Precautions / Restrictions Precautions Precautions: Fall Restrictions Weight Bearing Restrictions: No      Mobility Bed Mobility Overal bed mobility: Needs Assistance Bed Mobility: Supine to Sit;Sit to Supine     Supine to sit: Mod assist Sit to supine: Min assist   General bed mobility comments: multiple attempts for supine to sit with heavy use of momentum, verbal cues and physical assist; difficulty coordinating L hemi-body to shift; decreased effort/assist for transfer back to supine  Transfers Overall transfer level: Needs assistance Equipment used: 1 person hand held assist Transfers: Sit to/from Stand Sit to Stand: +2 safety/equipment;Min assist         General transfer comment: Min A +2 needed for maintaining balance during t/f and in standing; posterior/lateral lean in standing, Min A +2 and verbal cues to correct    Balance Overall balance assessment: Needs assistance Sitting-balance support: No upper extremity supported;Feet supported Sitting balance-Leahy Scale: Fair Sitting balance - Comments: initially poor and needed CGA, increased to fair during static and dynamic sitting (~30 min) Postural control: Posterior lean;Left lateral lean Standing balance support: Single extremity supported Standing balance-Leahy Scale: Poor Standing balance comment: L posterior/lateral weight shift with Min A +2 for maintaining steady static balance, intermittent pushing with increased LLE fatigue (~5 min)                           ADL either performed or assessed with clinical judgement   ADL Overall ADL's : Needs assistance/impaired  Eating/Feeding: Modified independent;Bed level Eating/Feeding Details (indicate cue type and reason): Mod I primarily using dominent R UE w/o difficulty, mild difficulty when incorporating L UE per pt report; moderate spillage noted Grooming: Sitting;Supervision/safety Grooming Details  (indicate cue type and reason): Per clinical reasoning, pt able to manipulate 3 Kleenex tissues in hand at one time. Supervision for sitting balance.                               General ADL Comments: Pt requires at least Min A for LB bathing and dressing. Anticipate Mod A x2 for toilet transfer requiring ambulation. Sit <> stand ADL transfer requiring Min A x2.     Vision Baseline Vision/History: Wears glasses Wears Glasses: At all times Patient Visual Report: No change from baseline Additional Comments: Pt reports upcoming eye surgery on 6/10- asked if he would need to reschedule it pending discharge plans     Perception     Praxis      Pertinent Vitals/Pain Pain Assessment: No/denies pain     Hand Dominance Right   Extremity/Trunk Assessment Upper Extremity Assessment Upper Extremity Assessment: LUE deficits/detail LUE Deficits / Details: Grossly 5/5 strength for all movements; mild impairments in coordination with RAM, finger to nose testing; no difficulty with thumb opposition testing; decreased sensation primarily forearm and hand LUE Sensation: decreased light touch;decreased proprioception LUE Coordination: decreased fine motor   Lower Extremity Assessment Lower Extremity Assessment: LLE deficits/detail LLE Deficits / Details: L LE grossly 5/5 throughout; moderately ataxic with foot placement, mod deficits in coordination and overall motor control. Denies sensory deficit LLE Coordination: decreased gross motor   Cervical / Trunk Assessment Cervical / Trunk Assessment: Normal   Communication Communication Communication: No difficulties   Cognition Arousal/Alertness: Awake/alert Behavior During Therapy: WFL for tasks assessed/performed Overall Cognitive Status: Within Functional Limits for tasks assessed                                 General Comments: motivated to participate in therapy, strong willed and eager to begin recovery    General Comments       Exercises Other Exercises: Sitting and standing dynamic balance involving reaching, balance activity, reaching across midline in all planes Other Exercises: [facility] exercises/activities   Shoulder Instructions      Home Living Family/patient expects to be discharged to:: Private residence Living Arrangements: Spouse/significant other Available Help at Discharge: Family;Available 24 hours/day Type of Home: House Home Access: Stairs to enter [employer] of Steps: 1 Entrance Stairs-Rails: None Home Layout: Two level;Bed/bath upstairs;Able to live on main level with bedroom/bathroom Alternate Level Stairs-Number of Steps: 18 Alternate Level Stairs-Rails: Right Bathroom Shower/Tub: Occupational psychologist: Handicapped height     Home Equipment: None          Prior Functioning/Environment Level of Independence: Independent        Comments: Indep with ADL, functional mobility w/o AD; very active, plays tennis, pickle ball and goes to the gym; still drives; 4 falls reported in the last 6 months        OT Problem List: Impaired balance (sitting and/or standing);Decreased coordination;Impaired sensation;Impaired UE functional use      OT Treatment/Interventions: Self-care/ADL training;Therapeutic exercise;Therapeutic activities;Neuromuscular education;Patient/family education;Balance training    OT Goals(Current goals can be found in the care plan section) Acute Rehab OT Goals Patient Stated Goal: "To get back  my independence" OT Goal Formulation: With patient Time For Goal Achievement: 11/24/19 Potential to Achieve Goals: Good  OT Frequency: Min 3X/week   Barriers to D/C:            Co-evaluation              AM-PAC OT "6 Clicks" Daily Activity     Outcome Measure Help from another person eating meals?: None Help from another person taking care of personal grooming?: A Little Help from another person toileting, which  includes using toliet, bedpan, or urinal?: A Lot Help from another person bathing (including washing, rinsing, drying)?: A Lot Help from another person to put on and taking off regular upper body clothing?: A Little Help from another person to put on and taking off regular lower body clothing?: A Lot 6 Click Score: 16   End of Session Nurse Communication: Other (comment)(positioning)  Activity Tolerance: Patient tolerated treatment well Patient left: in bed;with bed alarm set;with call bell/phone within reach  OT Visit Diagnosis: Hemiplegia and hemiparesis;Repeated falls (R29.6);Ataxia, unspecified (R27.0) Hemiplegia - Right/Left: Left Hemiplegia - dominant/non-dominant: Non-Dominant Hemiplegia - caused by: Cerebral infarction                Time: 1325-1416 OT Time Calculation (min): 51 min Charges:  OT General Charges $OT Visit: 1 Visit OT Evaluation $OT Eval High Complexity: 1 High OT Treatments $Neuromuscular Re-education: 38-52 mins  Jerilynn Birkenhead, OTS 11/10/19, 3:34 PM

## 2019-11-10 NOTE — ED Notes (Signed)
Neuro MD at bedside

## 2019-11-11 MED ORDER — ADULT MULTIVITAMIN W/MINERALS CH
1.0000 | ORAL_TABLET | Freq: Every day | ORAL | Status: DC
  Administered 2019-11-12 – 2019-11-23 (×12): 1 via ORAL
  Filled 2019-11-11 (×14): qty 1

## 2019-11-11 MED ORDER — HYDRALAZINE HCL 25 MG PO TABS
25.0000 mg | ORAL_TABLET | Freq: Three times a day (TID) | ORAL | Status: DC
  Administered 2019-11-11 – 2019-11-12 (×4): 25 mg via ORAL
  Filled 2019-11-11 (×6): qty 1

## 2019-11-11 MED ORDER — ENSURE ENLIVE PO LIQD
237.0000 mL | Freq: Two times a day (BID) | ORAL | Status: DC
  Administered 2019-11-12 – 2019-11-16 (×9): 237 mL via ORAL

## 2019-11-11 NOTE — Progress Notes (Signed)
PROGRESS NOTE  Francis Sims. [id number] DOB: Apr 05, 1941 DOA: 11/09/2019 PCP: Cassandria Anger, MD  Brief History   79 year old male with history of GERD, situational mixed anxiety and depression, elevated blood pressure, prediabetes and BPH presented to the ED with 1 day history of left-sided weakness and numbness.  Reportedly has had 4 episodes of falls in the past 6 months without loss of consciousness or any injury. MRI done in the ED showed acute small infarct in the posterior limb of the right internal capsule.  Patient outside the window for TPA and did not receive TPA.  Admitted for further management.  As the patient is outside the 24-38 hour window, hydralazine has been ordered q8hr to address blood pressures. Echocardiogram demonstrates 60-65% ejection fraction with normal LV function. There are no regional wall motion abnormalities. There is mild left ventricular hypertrophy. LV diastolic parameters are indeterminate. RV systolic function and size are normal. There is no atrial level shunt detected by color flow doppler. CTA head and neck has demonstrated severe and multifocal stenosis along the right posterior cerebral artery including severe stenosis near the right P1-P2 junction. CTA neck demonstrated less than 50% stenosis of the right ICA with a small area of ulcerated plaque at the common carotid bifurcation. There is also additional mild atherosclerotic irregularity along the remainder of the cervical ICA. Lt carotid system demonstrated an eccentric non-calcified plaque along the common carotid causing approximately 50% stenosis. Vertebral arteries are patent.  Neurology has recommended statin for lipid management, PT/OT/SLP eval, dual antiplatelet treatment for 3 weeks with change from ASA 81 mg daily and Plavix 75 mg daily to Plavix only after 3 weeks, and target BP less than 140/80. Unfortunately the patient is intolerant of statins. The patient is to follow up with  neurology as outpatient at discharge.    Consultants  . Neurology  Procedures  . None  Antibiotics   Anti-infectives (From admission, onward)   None    .  Subjective  The patient is resting quietly. No new complaints.   Objective   Vitals:  Vitals:   11/11/19 0834 11/11/19 0842  BP: (!) 178/103 (!) 179/113  Pulse: 77 71  Resp: 18   Temp: 98.9 F (37.2 C)   SpO2: 96% 96%    Exam:  Constitutional:  . Appears calm and comfortable Respiratory:  . CTA bilaterally, no w/r/r.  . Respiratory effort normal. No retractions or accessory muscle use Cardiovascular:  . RRR, no m/r/g . No LE extremity edema   . Normal pedal pulses Abdomen:  . Abdomen appears normal; no tenderness or masses . No hernias . No HSM Musculoskeletal:  . No cyanosis, clubbing, or edema bilaterally. Skin:  . No rashes, lesions, ulcers . palpation of skin: no induration or nodules Neurologic:  . CN 2-12 intact . Sensation all 4 extremities intact Psychiatric:  . Mental status o Mood, affect appropriate o Orientation to person, place, time  . judgment and insight appear intact    I have personally reviewed the following:   Today's Data  . Vitals, LDL, HbA1c, Glucoses  Imaging  . CTA head and neck  Cardiology Data  . Echocardiogram  .  Scheduled Meds: .  stroke: mapping our early stages of recovery book   Does not apply Once  . vitamin C  250 mg Oral Daily  . aspirin  81 mg Oral Daily  . cholecalciferol  1,000 Units Oral Daily  . clopidogrel  75 mg Oral Daily  . enoxaparin (  LOVENOX) injection  40 mg Subcutaneous Q24H  . hydrALAZINE  25 mg Oral Q8H  . loratadine  10 mg Oral Daily  . melatonin  5 mg Oral QHS  . olopatadine  1 drop Both Eyes BID  . omega-3 acid ethyl esters  1 g Oral Daily  . tamsulosin  0.4 mg Oral Daily   Continuous Infusions: . sodium chloride 50 mL/hr at 11/11/19 [id number]    Principal Problem:   Acute ischemic right MCA stroke [facility]) Active Problems:    PERIPHERAL VASCULAR DISEASE   Dyslipidemia   Situational mixed anxiety and depressive disorder   Accelerated hypertension   Stroke (cerebrum) (HCC)   LOS: 2 days   A & P  Acute ischemic right MCA stroke [facility]): Likely small vessel disease.  CTA  head and neck showed severe stenosis at right P1-P2 junction, no other significant stenosis.  2D echo shows EF of 60-35% with no wall motion abnormality and LVH. Neurology consult appreciated and recommends dual antiplatelet therapy with aspirin (81 mg and Plavix 70 mg for 3 weeks followed by Plavix monotherapy daily.  LDL of 135 but patient intolerant to statin.  A1c of 6.7. As the patient is outside the 48 hour window from acute event, therapy has been initiated to control his blood pressure. Neurology has recommended blood pressures less than 140/70. Blood pressure this morning was 179/113. He has been started on q8 hr hydralazine. Monitor and adjust therapy. He will follow up with neurology/stroke clinic as outpatient. The patient has been evaluated by PT/OT and has recommended CIR as the patient continues to have residual left lower extremity weakness and severe gait impairment. Evaluation for eligibility/ insurance authorization has begun.   Uncontrolled/accelerated hypertension: Blood pressure are pretty consistently high. The patient is outside of the 48 hour window following the acute event. The patient has been started on scheduled hydralazine. Monitor and adjust as necessary to achieve neurology recommended blood pressure of less than 140/70.   Situational mixed anxiety and depressive disorder: On low-dose Ativan as needed.  BPH: Continue Flomax  I have seen and examined this patient myself. I have spent 35 minutes in his evaluation and care.  DVT prophylaxis: Subcu Lovenox Code Status: Full Family Communication: None available. Disposition: Patient is from home. Anticipate discharge to rehab. Barriers to discharge include optimal control  of blood pressure and evaluation/authorization for rehab. The patient is not medically cleared for discharge due to unsafe blood pressure levels and the lack of a safe discharge plan.  Bird Tailor, DO [facility] Direct contact: see [email or web address]  7PM-7AM contact night coverage as above 11/11/2019, 12:08 PM  LOS: 2 days

## 2019-11-11 NOTE — ED Notes (Signed)
Patient placed in hospital bed for comfort.

## 2019-11-11 NOTE — Progress Notes (Signed)
Physical Therapy Treatment Patient Details Name: Francis Sims. MRN: [id number] DOB: 03-10-1941 Today's Date: 11/11/2019    History of Present Illness presented to ER secondary to acute onset of L UE weakness/numbness; admitted for TIA/CVA work -up.  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    PT Comments    Patient is in bed upon PT arrival with wife present. Eager to participate in therapy session. Initiated supine interventions for warm up with patient tolerating them well, does require frequent cueing for decreasing velocity for improving muscle contraction and coordination/sequencing.  He demonstrates improved bed mobility, transferring supine to sit with min A only required at final point of transition to help press into upright sitting position. Once sitting EOB patient initially required cues for finding center of mass but improved with ability to self recognize when he was starting to lean to the left or backwards with decreased need for external feedback. Sit to stand transfers practiced with focus on safe body mechanics and sequencing. Patient able to perform with left and right hand alternated and improved from [place] A to [place] by last set. At top of each stand patient stood for 60 seconds finding center with cues for core contraction and gluteal squeeze, decreased left lean with each set with increased ability to internal cue noted. Patient performed static standing marches with occasional buckling of L knee and limited stability. Seated strengthening interventions performed as well as ADLS including brushing teeth, opening toothpaste, etc with set up assistance only, able to self correct for left lean while brushing teeth and maintain upright posture. Returned to supine with supervision only. Additional supine interventions performed including bridges which transitioned into self propulsion to top of bed for comfort positioning. Patient and wife given handout of supine interventions  to perform with reminder of slow steady performance. Current POC remains appropriate at this time.     Follow Up Recommendations  CIR     Equipment Recommendations  Rolling walker with 5" wheels;3in1 (PT)    Recommendations for Other Services       Precautions / Restrictions Precautions Precautions: Fall Restrictions Weight Bearing Restrictions: No    Mobility  Bed Mobility Overal bed mobility: Needs Assistance Bed Mobility: Supine to Sit;Sit to Supine     Supine to sit: Min guard;Min assist Sit to supine: Supervision   General bed mobility comments: Patient requires Min A to perform supine to sit EOB to L side of bed. Only needs assistance for final press up into sitting, Sitting to supine performed with supervision.  Transfers Overall transfer level: Needs assistance Equipment used: Rolling walker (2 wheeled) Transfers: Sit to/from Stand Sit to Stand: Min assist;From elevated surface;Min guard         General transfer comment: elevated bed, 5x STS; focus on alternating hand placement on walker and bed, reaching for bed when returning to sitting, Initially min A , improved to CGA with max cueing for core activation and gluteal squeze  Ambulation/Gait             General Gait Details: unable to attempt due to limited resource, no chair follow available. Practiced standing marching and standing interventions by bend   Stairs             Wheelchair Mobility    Modified Rankin (Stroke Patients Only)       Balance Overall balance assessment: Needs assistance Sitting-balance support: No upper extremity supported;Feet supported Sitting balance-Leahy Scale: Fair Sitting balance - Comments: improved with repetition, occasional L  lean, patient able to self correct and self identify after first cues. Able to sit EOB and brush teeth and perform seated exercises Postural control: Posterior lean;Left lateral lean Standing balance support: Single extremity  supported Standing balance-Leahy Scale: Poor Standing balance comment: Requires RW for static standing and standing strengthening interventions. Improved stability with cues for core activation and gluteal squeeze                            Cognition Arousal/Alertness: Awake/alert Behavior During Therapy: WFL for tasks assessed/performed Overall Cognitive Status: Within Functional Limits for tasks assessed                                 General Comments: motivated to participate in therapy, strong willed and eager to begin recovery      Exercises General Exercises - Lower Extremity Ankle Circles/Pumps: Strengthening;Both;15 reps;Supine;Seated(15 reps supine, 15 reps seated) Quad Sets: Strengthening;Both;10 reps;Supine Long Arc Quad: Strengthening;Both;10 reps;Seated(EOB, arms crossed) Heel Slides: Strengthening;10 reps;Supine;Both Hip ABduction/ADduction: Strengthening;Both;10 reps;Supine Straight Leg Raises: Strengthening;Left;10 reps;Supine Hip Flexion/Marching: Strengthening;Both;15 reps;Seated;Standing(15 reps seated arms crossed cues to PT hand, 15 reps standing) Toe Raises: Strengthening;Both;15 reps;Seated Heel Raises: Strengthening;Both;15 reps;Seated Other Exercises Other Exercises: sit to stand x 5 trials with focus on proper hand placement, alternating hand one bed and hand on walker, cues for reaching back with hand: progress from Min A to CGA. Other Exercises: static stand with focus on equal weight shift and centering self in walker x 60 seconds x 5 trials Other Exercises: seated EOB: ADL function: brushing teeth while maintaining midline COM without UE support on bed, mouth wash, etc.    General Comments        Pertinent Vitals/Pain Pain Assessment: No/denies pain    Home Living                      Prior Function            PT Goals (current goals can now be found in the care plan section) Acute Rehab PT Goals Patient  Stated Goal: "To get back my independence" Time For Goal Achievement: 11/24/19 Potential to Achieve Goals: Good Progress towards PT goals: Progressing toward goals    Frequency    7X/week      PT Plan Current plan remains appropriate    Co-evaluation              AM-PAC PT "6 Clicks" Mobility   Outcome Measure  Help needed turning from your back to your side while in a flat bed without using bedrails?: A Little Help needed moving from lying on your back to sitting on the side of a flat bed without using bedrails?: A Little Help needed moving to and from a bed to a chair (including a wheelchair)?: A Lot Help needed standing up from a chair using your arms (e.g., wheelchair or bedside chair)?: A Lot Help needed to walk in hospital room?: A Lot Help needed climbing 3-5 steps with a railing? : A Lot 6 Click Score: 14    End of Session Equipment Utilized During Treatment: Gait belt Activity Tolerance: Patient tolerated treatment well Patient left: in bed;with call bell/phone within reach;with bed alarm set Nurse Communication: Mobility status PT Visit Diagnosis: Difficulty in walking, not elsewhere classified (R26.2);Hemiplegia and hemiparesis Hemiplegia - Right/Left: Left Hemiplegia - dominant/non-dominant: Non-dominant Hemiplegia - caused by: Cerebral  infarction     Time: 1133-1227 PT Time Calculation (min) (ACUTE ONLY): 54 min  Charges:  $Therapeutic Exercise: 23-37 mins $Therapeutic Activity: 8-22 mins $Neuromuscular Re-education: 8-22 mins                      Janna Arch, PT, DPT   11/11/2019, 1:49 PM

## 2019-11-11 NOTE — Progress Notes (Signed)
Initial Nutrition Assessment  RD working remotely.  DOCUMENTATION CODES:   Not applicable  INTERVENTION:   -Ensure Enlive po BID, each supplement provides 350 kcal and 20 grams of protein -MVI with minerals daily  NUTRITION DIAGNOSIS:   Increased nutrient needs related to acute illness(CVA) as evidenced by estimated needs.  GOAL:   Patient will meet greater than or equal to 90% of their needs  MONITOR:   PO intake, Supplement acceptance, Labs, Weight trends, Skin, I & O's  REASON FOR ASSESSMENT:   Malnutrition Screening Tool    ASSESSMENT:   Francis Sims. is a 79 y.o. male with medical history significant of GERD, situational mixed anxiety and depression, elevated blood pressure diagnosed at [facility], BPH edema and prediabetes (A1c of 6.3 based on last lab) who presented to the ED with 1 day history of left-sided weakness and numbness.  Pt admitted with acute ischemic rt MCA stroke.   Reviewed I/O's: +345 ml x 24 hours and -855 ml since admission  UOP: 120 ml x 24 hours  Per MD notes, MRI of brain revealed small acute infarct in the posterior limb of the right internal capsule. He did not receive TPA due to being out of therapeutic window.   Per chart review, pt with no swallowing difficulties and good appetite. He consumed 75% of breakfast.   Reviewed wt hx; pt has experienced a 5.2% wt loss over the past year, which is not significant for time frame. Per chart review, pt lost weight over the past year secondary to his son's death and wife's cancer diagnosis.   Per therapy notes, recommending CIR at discharge.   Medications reviewed and include vitamin C, lovenox, melatonin, omega-3, 0.9% sodium chloride infusion @ 50 ml/hr.   Labs reviewed.   Diet Order:   Diet Order            Diet Heart Room service appropriate? Yes; Fluid consistency: Thin  Diet effective now              EDUCATION NEEDS:   No education needs have been  identified at this time  Skin:  Skin Assessment: Reviewed RN Assessment  Last BM:  11/11/19  Height:   Ht Readings from Last 1 Encounters:  11/09/19 6\' 2"  (1.88 m)    Weight:   Wt Readings from Last 1 Encounters:  11/09/19 88.5 kg    Ideal Body Weight:  86.4 kg  BMI:  Body mass index is 25.04 kg/m.  Estimated Nutritional Needs:   Kcal:  2200-2400  Protein:  115-130 grams  Fluid:  > 2.2 L   Loistine Chance, RD, LDN, [place] Registered Dietitian II Certified Diabetes Care and Education Specialist Please refer to [facility] for RD and/or RD on-call/weekend/after hours pager

## 2019-11-12 MED ORDER — HYDRALAZINE HCL 50 MG PO TABS
50.0000 mg | ORAL_TABLET | Freq: Four times a day (QID) | ORAL | Status: DC
  Administered 2019-11-12 – 2019-11-23 (×34): 50 mg via ORAL
  Filled 2019-11-12 (×35): qty 1

## 2019-11-12 MED ORDER — METOPROLOL TARTRATE 25 MG PO TABS
25.0000 mg | ORAL_TABLET | Freq: Two times a day (BID) | ORAL | Status: DC
  Administered 2019-11-12 – 2019-11-23 (×22): 25 mg via ORAL
  Filled 2019-11-12 (×24): qty 1

## 2019-11-12 NOTE — Progress Notes (Signed)
Patient states he will tell his wife about his fall when she gets here later today; does not wish staff to call and notify her.

## 2019-11-12 NOTE — Progress Notes (Signed)
PROGRESS NOTE  [name] [id number] DOB: 1941-06-02 DOA: 11/09/2019 PCP: Cassandria Anger, MD  Brief History   79 year old male with history of GERD, situational mixed anxiety and depression, elevated blood pressure, prediabetes and BPH presented to the ED with 1 day history of left-sided weakness and numbness.  Reportedly has had 4 episodes of falls in the past 6 months without loss of consciousness or any injury. MRI done in the ED showed acute small infarct in the posterior limb of the right internal capsule.  Patient outside the window for TPA and did not receive TPA.  Admitted for further management.  As the patient is outside the 24-38 hour window, hydralazine has been ordered q8hr to address blood pressures. Echocardiogram demonstrates 60-65% ejection fraction with normal LV function. There are no regional wall motion abnormalities. There is mild left ventricular hypertrophy. LV diastolic parameters are indeterminate. RV systolic function and size are normal. There is no atrial level shunt detected by color flow doppler. CTA head and neck has demonstrated severe and multifocal stenosis along the right posterior cerebral artery including severe stenosis near the right P1-P2 junction. CTA neck demonstrated less than 50% stenosis of the right ICA with a small area of ulcerated plaque at the common carotid bifurcation. There is also additional mild atherosclerotic irregularity along the remainder of the cervical ICA. Lt carotid system demonstrated an eccentric non-calcified plaque along the common carotid causing approximately 50% stenosis. Vertebral arteries are patent.  Neurology has recommended statin for lipid management, PT/OT/SLP eval, dual antiplatelet treatment for 3 weeks with change from ASA 81 mg daily and Plavix 75 mg daily to Plavix only after 3 weeks, and target BP less than 140/80. Unfortunately the patient is intolerant of statins. The patient is to follow up with  neurology as outpatient at discharge.    Consultants  . Neurology  Procedures  . None  Antibiotics   Anti-infectives (From admission, onward)   None     Subjective  The patient is resting quietly. No new complaints.   Objective   Vitals:  Vitals:   11/12/19 0419 11/12/19 0613  BP: (!) 170/87 (!) 192/83  Pulse: 87 71  Resp: 19 19  Temp: 98.2 F (36.8 C) 97.8 F (36.6 C)  SpO2: 98% 97%    Exam:  Constitutional:  . Appears calm and comfortable Respiratory:  . CTA bilaterally, no w/r/r.  . Respiratory effort normal. No retractions or accessory muscle use Cardiovascular:  . RRR, no m/r/g . No LE extremity edema   . Normal pedal pulses Abdomen:  . Abdomen appears normal; no tenderness or masses . No hernias . No HSM Musculoskeletal:  . No cyanosis, clubbing, or edema bilaterally. Skin:  . No rashes, lesions, ulcers . palpation of skin: no induration or nodules Neurologic:  . CN 2-12 intact . Sensation all 4 extremities intact Psychiatric:  . Mental status o Mood, affect appropriate o Orientation to person, place, time  . judgment and insight appear intact    I have personally reviewed the following:   Today's Data  . Vitals, Glucoses  Imaging  . CTA head and neck  Cardiology Data  . Echocardiogram  .  Scheduled Meds: .  stroke: mapping our early stages of recovery book   Does not apply Once  . vitamin C  250 mg Oral Daily  . aspirin  81 mg Oral Daily  . cholecalciferol  1,000 Units Oral Daily  . clopidogrel  75 mg Oral Daily  . enoxaparin (  LOVENOX) injection  40 mg Subcutaneous Q24H  . feeding supplement (ENSURE ENLIVE)  237 mL Oral BID BM  . hydrALAZINE  50 mg Oral Q6H  . loratadine  10 mg Oral Daily  . melatonin  5 mg Oral QHS  . multivitamin with minerals  1 tablet Oral Daily  . olopatadine  1 drop Both Eyes BID  . omega-3 acid ethyl esters  1 g Oral Daily  . tamsulosin  0.4 mg Oral Daily   Continuous Infusions: . sodium chloride 50  mL/hr at 11/11/19 2340    Principal Problem:   Acute ischemic right MCA stroke [facility]) Active Problems:   PERIPHERAL VASCULAR DISEASE   Dyslipidemia   Situational mixed anxiety and depressive disorder   Accelerated hypertension   Stroke (cerebrum) (HCC)   LOS: 3 days   A & P  Acute ischemic right MCA stroke [facility]): Likely small vessel disease.  CTA  head and neck showed severe stenosis at right P1-P2 junction, no other significant stenosis.  2D echo shows EF of 60-35% with no wall motion abnormality and LVH. Neurology consult appreciated and recommends dual antiplatelet therapy with aspirin (81 mg and Plavix 70 mg for 3 weeks followed by Plavix monotherapy daily.  LDL of 135 but patient intolerant to statin.  A1c of 6.7. As the patient is outside the 48 hour window from acute event, therapy has been initiated to control his blood pressure. Neurology has recommended blood pressures less than 140/70. Blood pressure this morning was 179/113. He has been started on q8 hr hydralazine. Monitor and adjust therapy. He will follow up with neurology/stroke clinic as outpatient. The patient has been evaluated by PT/OT and has recommended CIR as the patient continues to have residual left lower extremity weakness and severe gait impairment. Evaluation for eligibility/ insurance authorization has begun.   Uncontrolled/accelerated hypertension: Blood pressure are pretty consistently high. The patient is outside of the 48 hour window following the acute event. The patient has been started on scheduled hydralazine. Monitor and adjust as necessary to achieve neurology recommended blood pressure of less than 140/70. Blood pressures remain very high. I have added metoprolol. Monitor.  Situational mixed anxiety and depressive disorder: On low-dose Ativan as needed.  BPH: Continue Flomax  I have seen and examined this patient myself. I have spent 32 minutes in his evaluation and care.  DVT prophylaxis: Subcu  Lovenox Code Status: Full Family Communication: None available. Disposition: Patient is from home. Anticipate discharge to rehab. Barriers to discharge include optimal control of blood pressure and evaluation/authorization for rehab. The patient is not medically cleared for discharge due to unsafe blood pressure levels and the lack of a safe discharge plan.  Marcayla Budge, DO [facility] Direct contact: see [email or web address]  7PM-7AM contact night coverage as above 11/12/2019, 11:33 PM  LOS: 2 days

## 2019-11-12 NOTE — Progress Notes (Signed)
Physical Therapy Treatment Patient Details Name: Francis Sims. MRN: [id number] DOB: [birth year] Today's Date: 11/12/2019    History of Present Illness presented to ER secondary to acute onset of L UE weakness/numbness; admitted for TIA/CVA work -up.  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    PT Comments    Patient is making progress towards functional goals and remains very motivated and eager to participate with therapy efforts. Patient is impulsive at times but is easily redirected and has carry over of safety instruction. Patient required Min A for bed mobility with intermittent facilitation for left hemibody. Patient can accept external challenges in sitting position with good balance initially, however demonstrated increased left and later lean with fatigue that required facilitation to achieve midline. Good carry over of technique/safety with sit to stand transfers on repeated trails. Patient does have intermittent knee buckling on left with ambulation and weight shifting in standing position. PT will continue to follow to maximize functional independence in preparation for next level of care. CIR highly recommended at discharge.    Follow Up Recommendations  CIR     Equipment Recommendations  Rolling walker with 5" wheels;3in1 (PT)    Recommendations for Other Services       Precautions / Restrictions Precautions Precautions: Fall Restrictions Weight Bearing Restrictions: No    Mobility  Bed Mobility Overal bed mobility: Needs Assistance Bed Mobility: Sit to Supine     Supine to sit: Min assist Sit to supine: Supervision   General bed mobility comments: verbal cues for safety and sequencing. intermittent faciliation for left hemibody to sit up on left side of bed   Transfers Overall transfer level: Needs assistance Equipment used: Rolling walker (2 wheeled) Transfers: Sit to/from Stand Sit to Stand: Min assist;Mod assist         General transfer  comment: Mod A required for the first stand and Min A required for subsequent sit to stands. performed sit to stand x 5 bouts with cues and faciliation for midline orientation and assistance for lift off. patient has carry over of instuction demonstrated with repeated trials. patient fatigued with activity. no significant change in vitals noted with activity   Ambulation/Gait Ambulation/Gait assistance: Max assist Gait Distance (Feet): 2 Feet Assistive device: Rolling walker (2 wheeled)       General Gait Details: patient required assistance for weightshifting and advancement of LLE. decreased coordination of foot placement. noted left knee buckling at times with increased activity in standing. did not progress ambulation further due to safety concerns with one person assisting. recommend chair follow or second person assistance for safety    Stairs             Wheelchair Mobility    Modified Rankin (Stroke Patients Only)       Balance Overall balance assessment: Needs assistance Sitting-balance support: Feet supported;No upper extremity supported Sitting balance-Leahy Scale: Fair Sitting balance - Comments: patient initially with good sitting balance and accepted external perturbations x 5 in all directions while maintaining midline posture. as patient fatigues with increased activity, no longer able to maintain midline with challenges of reaching outside base of support and required faciliation and verbal cues for correction to midline.loss of balance is primarily left and posterior  Postural control: Left lateral lean;Posterior lean Standing balance support: Single extremity supported Standing balance-Leahy Scale: Poor Standing balance comment: left posterior lateral lean in standing position.  Cognition Arousal/Alertness: Awake/alert Behavior During Therapy: WFL for tasks assessed/performed Overall Cognitive Status: Within Functional  Limits for tasks assessed                                 General Comments: intermittent impuslive but easily redirected with carry over of safety instruction       Exercises General Exercises - Lower Extremity Ankle Circles/Pumps: 20 reps;Strengthening;Both;Supine Quad Sets: AROM;20 reps Long Arc Quad: Supine;20 reps;AROM;Strengthening Heel Slides: AAROM;20 reps;Left;Strengthening Hip ABduction/ADduction: AAROM;Strengthening;Left;20 reps;Supine Other Exercises Other Exercises: verbal cues for technique. sit to stand performed x 5 reps with Min A. with cues for technique, safe hand placement, symmetical weight bearing, anterior weight translation.     General Comments        Pertinent Vitals/Pain Pain Assessment: No/denies pain    Home Living                      Prior Function            PT Goals (current goals can now be found in the care plan section) Acute Rehab PT Goals Patient Stated Goal: to get to rehab and regain independence  PT Goal Formulation: With patient Time For Goal Achievement: 11/24/19 Potential to Achieve Goals: Good Progress towards PT goals: Progressing toward goals    Frequency    7X/week      PT Plan Current plan remains appropriate    Co-evaluation              AM-PAC PT "6 Clicks" Mobility   Outcome Measure  Help needed turning from your back to your side while in a flat bed without using bedrails?: A Little Help needed moving from lying on your back to sitting on the side of a flat bed without using bedrails?: A Little Help needed moving to and from a bed to a chair (including a wheelchair)?: A Lot Help needed standing up from a chair using your arms (e.g., wheelchair or bedside chair)?: A Lot Help needed to walk in hospital room?: A Lot Help needed climbing 3-5 steps with a railing? : A Lot 6 Click Score: 14    End of Session Equipment Utilized During Treatment: Gait belt Activity Tolerance: Patient  tolerated treatment well Patient left: in bed;with call bell/phone within reach;with bed alarm set Nurse Communication: Mobility status PT Visit Diagnosis: Difficulty in walking, not elsewhere classified (R26.2);Hemiplegia and hemiparesis Hemiplegia - Right/Left: Left Hemiplegia - dominant/non-dominant: Non-dominant Hemiplegia - caused by: Cerebral infarction     Time: [id number] PT Time Calculation (min) (ACUTE ONLY): 46 min  Charges:  $Therapeutic Exercise: 8-22 mins $Therapeutic Activity: 8-22 mins $Neuromuscular Re-education: 8-22 mins                     Minna Merritts, PT, MPT    Percell Locus 11/12/2019, 10:32 AM

## 2019-11-12 NOTE — Progress Notes (Signed)
Telemetry called at approximately 0400 to say pt was off the monitor. RN went to check on patient and found him on the floor, at the bottom of the bed. Pt stated he was trying to get into the bathroom and "forgot I couldn't just get up and go." Both side rails were up on the side of the bed pt exited from; pt states he slid between bed rail and bottom of bed. Patient denies pain; see flowsheet data for assessment. Bed alarm on; low bed ordered.    11/12/19 0419  What Happened  Was fall witnessed? No  Was patient injured? No  Patient found on floor  Found by Staff-comment  Stated prior activity to/from bed, chair, or stretcher  Follow Up  MD notified NP [place]  Time MD notified 76  Family notified No - patient refusal  Additional tests No  Simple treatment Other (comment) (n/a)  Progress note created (see row info) Yes  Adult Fall Risk Assessment  Risk Factor Category (scoring not indicated) Fall has occurred during this admission (document High fall risk)  Age 79  Fall History: Fall within 6 months prior to admission 5  Elimination; Bowel and/or Urine Incontinence 0  Elimination; Bowel and/or Urine Urgency/Frequency 2  Medications: includes PCA/Opiates, Anti-convulsants, Anti-hypertensives, Diuretics, Hypnotics, Laxatives, Sedatives, and Psychotropics 5  Patient Care Equipment 2  Mobility-Assistance 2  Mobility-Gait 2  Mobility-Sensory Deficit 0  Altered awareness of immediate physical environment 0  Impulsiveness 0  Lack of understanding of one's physical/cognitive limitations 0  Total Score 20  Patient Fall Risk Level High fall risk  Adult Fall Risk Interventions  Required Bundle Interventions *See Row Information* High fall risk - low, moderate, and high requirements implemented  Additional Interventions Use of appropriate toileting equipment (bedpan, BSC, etc.)  Screening for Fall Injury Risk (To be completed on HIGH fall risk patients) - Assessing Need for Low Bed  Risk For  Fall Injury- Low Bed Criteria Previous fall this admission  Will Implement Low Bed and Floor Mats Yes  Screening for Fall Injury Risk (To be completed on HIGH fall risk patients who do not meet crieteria for Low Bed) - Assessing Need for Floor Mats Only  Risk For Fall Injury- Criteria for Floor Mats None identified - No additional interventions needed  Vitals  Temp 98.2 F (36.8 C)  Temp Source Oral  BP (!) 170/87  BP Location Right Arm  BP Method Automatic  Patient Position (if appropriate) Lying  Pulse Rate 87  Pulse Rate Source Monitor  ECG Heart Rate 87  Resp 19  Oxygen Therapy  SpO2 98 %  O2 Device Room Air  Pain Assessment  Pain Scale 0-10  Pain Score 0  PCA/Epidural/Spinal Assessment  Respiratory Pattern Regular;Unlabored  Neurological  Neuro (WDL) WDL  Level of Consciousness Alert  Orientation Level Oriented X4  Cognition Appropriate at baseline  Speech Clear  R Pupil Size (mm) 3  R Pupil Reaction Brisk  L Pupil Size (mm) 3  L Pupil Reaction Brisk  Facial Symmetry Symmetrical  R Hand Grip Strong  L Hand Grip Strong  Right Pronator Drift Absent  Left Pronator Drift Absent  R Foot Dorsiflexion Strong  L Foot Dorsiflexion Strong  R Foot Plantar Flexion Strong  L Foot Plantar Flexion Strong  NIH Stroke Scale ( + Modified Stroke Scale Criteria)   Interval Shift assessment  Level of Consciousness (1a.)    0  LOC Questions (1b. )   + 0  LOC Commands (1c. )   +  0  Best Gaze (2. )  + 0  Visual (3. )  + 0  Facial Palsy (4. )     0  Motor Arm, Left (5a. )   + 0  Motor Arm, Right (5b. )   + 0  Motor Leg, Left (6a. )   + 0  Motor Leg, Right (6b. )   + 0  Limb Ataxia (7. ) 0  Sensory (8. )   + 1  Best Language (9. )   + 0  Dysarthria (10. ) 0  Extinction/Inattention (11.)   + 0  Modified SS Total  + 1  Complete NIHSS TOTAL 1  Musculoskeletal  Musculoskeletal (WDL) X  Assistive Device Front wheel walker  Generalized Weakness Yes  Weight Bearing Restrictions  No  Musculoskeletal Details  LUE Weakness  LLE Weakness  Integumentary  Integumentary (WDL) X  Skin Color Appropriate for ethnicity  Skin Condition Dry  Skin Integrity Skin tear  Skin Tear Location Elbow  Skin Tear Location Orientation Left  Skin Tear Intervention Other (Comment) (old; assessed)  Skin Turgor Non-tenting

## 2019-11-13 NOTE — Care Management Important Message (Signed)
Important Message  Patient Details  Name: Francis Sims. MRN: [id number] Date of Birth: December 09, 1940   Medicare Important Message Given:  Yes     Dannette Barbara 11/13/2019, 11:19 AM

## 2019-11-13 NOTE — Progress Notes (Signed)
Physical Therapy Treatment Patient Details Name: Francis Sims. MRN: [id number] DOB: 12/17/1940 Today's Date: 11/13/2019    History of Present Illness presented to ER secondary to acute onset of L UE weakness/numbness; admitted for TIA/CVA work -up.  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    PT Comments    Pt pleasant and very motivated to participate during the session.  Pt taken through graded therex per below with manual resistance able to be provided for several LLE exercises.  Pt required min A and cues for proper sequencing during sit to/from stand and once in standing c/o onset of dizziness and HA.  Pt returned to sitting with BP taken at 130/64.  Pt reported that symptoms resolved and was assisted to standing again with same onset of symptom with pt reporting symptoms made him feel "wobbly and unsteady".  Pt returned to sitting with BP taken immediately with symptoms still present at 130/72.  Pt also reported onset of 4-5/10 HA, nursing notified.  Pt would be an excellent candidate for CIR rehab and should make good progress towards goals once above symptoms resolve.      Follow Up Recommendations  CIR     Equipment Recommendations  Rolling walker with 5" wheels;3in1 (PT)    Recommendations for Other Services       Precautions / Restrictions Precautions Precautions: Fall Restrictions Weight Bearing Restrictions: No    Mobility  Bed Mobility               General bed mobility comments: NT, pt in recliner  Transfers Overall transfer level: Needs assistance Equipment used: Rolling walker (2 wheeled) Transfers: Sit to/from Stand Sit to Stand: Min assist         General transfer comment: Mod verbal cues for sequencing for hand placement and increased trunk flexion; Pt dizzy and unsteady in standing with BP taken 130/72 once returned to sitting  Ambulation/Gait             General Gait Details: Unsafe to attempt secondary to pt dizzy and  unsteady in standing, nursing notified   Stairs             Wheelchair Mobility    Modified Rankin (Stroke Patients Only)       Balance Overall balance assessment: Needs assistance Sitting-balance support: Feet supported;No upper extremity supported Sitting balance-Leahy Scale: Fair     Standing balance support: Bilateral upper extremity supported Standing balance-Leahy Scale: Poor Standing balance comment: Min A for stability in standing with pt reporting feeling "dizzy and unsteady"                            Cognition Arousal/Alertness: Awake/alert Behavior During Therapy: WFL for tasks assessed/performed Overall Cognitive Status: Within Functional Limits for tasks assessed                                        Exercises Total Joint Exercises Ankle Circles/Pumps: Strengthening;Both;10 reps Quad Sets: Strengthening;Both;10 reps Gluteal Sets: Strengthening;Both;10 reps Towel Squeeze: Strengthening;Both;10 reps Hip ABduction/ADduction: Strengthening;10 reps;Left(with manual resistance.) Straight Leg Raises: Strengthening;Left;10 reps(with manual resistance.) Long Arc Quad: Strengthening;Left;10 reps(with manual resistance.) Knee Flexion: Strengthening;Left;10 reps(with manual resistance.) Other Exercises Other Exercises: HEP education/review for BLE supine and seated therex    General Comments        Pertinent Vitals/Pain Pain Assessment: 0-10 Pain Score: 4  Pain Location: L elbow Pain Descriptors / Indicators: Sore Pain Intervention(s): Premedicated before session;Monitored during session    Home Living                      Prior Function            PT Goals (current goals can now be found in the care plan section) Progress towards PT goals: Progressing toward goals    Frequency    7X/week      PT Plan Current plan remains appropriate    Co-evaluation              AM-PAC PT "6 Clicks"  Mobility   Outcome Measure  Help needed turning from your back to your side while in a flat bed without using bedrails?: A Little Help needed moving from lying on your back to sitting on the side of a flat bed without using bedrails?: A Little Help needed moving to and from a bed to a chair (including a wheelchair)?: A Lot Help needed standing up from a chair using your arms (e.g., wheelchair or bedside chair)?: A Lot Help needed to walk in hospital room?: A Lot Help needed climbing 3-5 steps with a railing? : A Lot 6 Click Score: 14    End of Session Equipment Utilized During Treatment: Gait belt Activity Tolerance: Other (comment)(Pt limited by dizziness in standing this session) Patient left: in chair;with family/visitor present;with call bell/phone within reach;Other (comment)(chair alarm left off as pt was found, nursing notified) Nurse Communication: Mobility status PT Visit Diagnosis: Difficulty in walking, not elsewhere classified (R26.2);Hemiplegia and hemiparesis Hemiplegia - Right/Left: Left Hemiplegia - dominant/non-dominant: Non-dominant Hemiplegia - caused by: Cerebral infarction     Time: [id number] PT Time Calculation (min) (ACUTE ONLY): 25 min  Charges:  $Therapeutic Exercise: 8-22 mins $Therapeutic Activity: 8-22 mins                     D. Scott Jhamal Plucinski PT, DPT 11/13/19, 1:08 PM

## 2019-11-13 NOTE — Progress Notes (Signed)
PROGRESS NOTE    Francis Sims.  [id number] DOB: September 02, 1940 DOA: 11/09/2019 PCP: Cassandria Anger, MD   Chief Complaint  Patient presents with  . Fall  . Weakness    Brief Narrative:  79 year old male with situational mixed anxiety and depression, elevated blood pressure, prediabetes, BPH and GERD admitted with 1 day history of left-sided weakness and numbness.  Reportedly he also had 4 episodes of falls in the past 6 months without sustaining any injury or loss of consciousness. MRI of the brain showed acute small infarct in the posterior limb of the right internal capsule.   Assessment & Plan:   Principal Problem:   Acute ischemic right MCA stroke (HCC) Likely small vessel disease.  CTA  head and neck showed severe stenosis at right P1-P2 junction, no other significant stenosis.  2D echo shows EF of 60-35% with no wall motion abnormality and LVH. Neurology  recommends dual antiplatelet therapy with aspirin 81 mg and Plavix 75 mg for 3 weeks followed by Plavix monotherapy daily.  LDL of 135 but patient intolerant to statin.  A1c of 6.7. Allow permissive hypertension for now followed by aggressive blood pressure control. Outpatient follow-up with neurology/stroke clinic. PT/OT recommends CIR given some residual left lower extremity weakness and severe gait impairment.  Awaiting insurance authorization.  Active Problems: Uncontrolled/accelerated hypertension Diagnosis of whitecoat hypertension per primary.  On low-dose amlodipine (patient reports that he was taken off by PCP).    Allowing permissive hypertension for now and subsequently needs tight blood pressure control.  Situational mixed anxiety and depressive disorder On low-dose Ativan as needed.  BPH Continue Flomax    DVT prophylaxis: Subcu Lovenox Code Status: Full code Family Communication: Wife at bedside Disposition:   Status is: Inpatient  Remains inpatient appropriate because:Unsafe d/c  plan   Dispo: The patient is from: Home              Anticipated d/c is to: CIR              Anticipated d/c date is: 1 day              Patient currently is not medically stable to d/c.        Consultants:   Neurology  Procedures: CT head, MRI brain, CT angiogram head and neck, 2D echo  Antimicrobials: None   Subjective: Seen and examined.  Frustrated on having to lie down in bed and wants to sit on a recliner.  Objective: Vitals:   11/12/19 2049 11/12/19 2358 11/13/19 0502 11/13/19 0733  BP: (!) 156/83 (!) 143/67 124/68 (!) 142/75  Pulse: 79 71 (!) 59 65  Resp: 19 19 19 16   Temp: 98.5 F (36.9 C) 98.8 F (37.1 C) 97.6 F (36.4 C) 97.9 F (36.6 C)  TempSrc: Oral Oral Oral Oral  SpO2: 96% 95% 97% 93%  Weight:      Height:        Intake/Output Summary (Last 24 hours) at 11/13/2019 1113 Last data filed at 11/13/2019 [id number] Gross per 24 hour  Intake --  Output 775 ml  Net -775 ml   Filed Weights   11/09/19 0950  Weight: 88.5 kg    Examination:  General: Elderly male not in distress HEENT: Moist mucosa, supple neck Chest: Clear CVs: Normal S1-S2 GI: Soft, nondistended, nontender Musculoskeletal: Warm, no edema CNS: Alert and oriented, 5/5 power in bilateral extremities, diminished sensation in left hand and left lower leg.   Data Reviewed: I have personally  reviewed following labs and imaging studies  CBC: Recent Labs  Lab 11/09/19 0958  WBC 8.6  NEUTROABS 5.9  HGB 16.3  HCT 47.8  MCV 83.1  PLT [id number]    Basic Metabolic Panel: Recent Labs  Lab 11/09/19 0958  NA 139  K 3.9  CL 104  CO2 26  GLUCOSE 144*  BUN 19  CREATININE 0.75  CALCIUM 8.9  MG 2.0    GFR: Estimated Creatinine Clearance: 91.3 mL/min (by C-G formula based on SCr of 0.75 mg/dL).  Liver Function Tests: Recent Labs  Lab 11/09/19 0958  AST 26  ALT 24  ALKPHOS 85  BILITOT 1.6*  PROT 8.2*  ALBUMIN 4.6    CBG: Recent Labs  Lab 11/09/19 1003  GLUCAP 145*      Recent Results (from the past 240 hour(s))  SARS Coronavirus 2 by RT PCR (hospital order, performed in [facility] hospital lab) Nasopharyngeal Nasopharyngeal Swab     Status: None   Collection Time: 11/09/19  4:45 PM   Specimen: Nasopharyngeal Swab  Result Value Ref Range Status   SARS Coronavirus 2 NEGATIVE NEGATIVE Final    Comment: (NOTE) SARS-CoV-2 target nucleic acids are NOT DETECTED. The SARS-CoV-2 RNA is generally detectable in upper and lower respiratory specimens during the acute phase of infection. The lowest concentration of SARS-CoV-2 viral copies this assay can detect is 250 copies / mL. A negative result does not preclude SARS-CoV-2 infection and should not be used as the sole basis for treatment or other patient management decisions.  A negative result may occur with improper specimen collection / handling, submission of specimen other than nasopharyngeal swab, presence of viral mutation(s) within the areas targeted by this assay, and inadequate number of viral copies (<250 copies / mL). A negative result must be combined with clinical observations, patient history, and epidemiological information. Fact Sheet for Patients:   StrictlyIdeas.no Fact Sheet for Healthcare Providers: BankingDealers.co.za This test is not yet approved or cleared  by the [place] FDA and has been authorized for detection and/or diagnosis of SARS-CoV-2 by FDA under an Emergency Use Authorization (EUA).  This EUA will remain in effect (meaning this test can be used) for the duration of the COVID-19 declaration under Section 564(b)(1) of the Act, 21 U.S.C. section 360bbb-3(b)(1), unless the authorization is terminated or revoked sooner. Performed at [facility], [address], [place], [place] [address]          Radiology Studies: No results found.      Scheduled Meds: .  stroke: mapping our early stages of  recovery book   Does not apply Once  . vitamin C  250 mg Oral Daily  . aspirin  81 mg Oral Daily  . cholecalciferol  1,000 Units Oral Daily  . clopidogrel  75 mg Oral Daily  . enoxaparin (LOVENOX) injection  40 mg Subcutaneous Q24H  . feeding supplement (ENSURE ENLIVE)  237 mL Oral BID BM  . hydrALAZINE  50 mg Oral Q6H  . loratadine  10 mg Oral Daily  . melatonin  5 mg Oral QHS  . metoprolol tartrate  25 mg Oral BID  . multivitamin with minerals  1 tablet Oral Daily  . olopatadine  1 drop Both Eyes BID  . omega-3 acid ethyl esters  1 g Oral Daily  . tamsulosin  0.4 mg Oral Daily   Continuous Infusions: . sodium chloride 50 mL/hr at 11/11/19 2340     LOS: 4 days    Time spent:  25 minutes    Louellen Molder, MD [facility]   To contact the attending provider between 7A-7P or the covering provider during after hours 7P-7A, please log into the web site [email or web address] and access using universal [place] password for that web site. If you do not have the password, please call the hospital operator.  11/13/2019, 11:13 AM

## 2019-11-13 NOTE — Progress Notes (Signed)
Inpatient Rehabilitation Admissions Coordinator  Inpatient rehab consult received. I met with patient and his wife at bedside for assessment. We discussed goals and expectations of a possible inpt rehab admit at [facility] in [place]. They are in agreement. I will begin insurance authorization with [employer]. They typically take 48 to 72 hours for determination,.  Danne Baxter, RN, MSN Rehab Admissions Coordinator [phone] 11/13/2019 12:37 PM

## 2019-11-13 NOTE — Progress Notes (Signed)
Occupational Therapy Treatment Patient Details Name: Francis Sims. MRN: [id number] DOB: Jan 30, 1941 Today's Date: 11/13/2019    History of present illness presented to ER secondary to acute onset of L UE weakness/numbness; admitted for TIA/CVA work -up.  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   OT comments  Pt seen for OT tx this date. Pt up in recliner and eager to lay back down 2/2 increasing dizziness. Pt with head reclined, BLE elevated. Initial BP 113/65, HR 58. Pt required Min A and VC for hand/foot placement with initial posterior lean with standing but able to correct with manual cues and verbal cue. Mild L lateral lean with pt able to self correct with VC. Pt reporting his LLE feeling like it was going to "buckle". No buckling noted but LLE with ataxia. Upon standing pt reporting increasing dizziness BP 96/63 and HR 70. Once sitting EOB BP 131/71, HR 60. Once supine in bed BP 134/77 and HR 65 with pt reporting dizziness slowly improving. Pt able to utilize urinal independently from bed level. Additional activity deferred 2/2 dizziness and pt requesting time to feel better. RN notified of vitals. Pt continues to benefit from skilled OT services. Continue to recommend CIR at this time. Pt eager to return to active independent PLOF including playing tennis and pickle ball.   Follow Up Recommendations  CIR    Equipment Recommendations  None recommended by OT    Recommendations for Other Services      Precautions / Restrictions Precautions Precautions: Fall Precaution Comments: watch orthostatics Restrictions Weight Bearing Restrictions: No       Mobility Bed Mobility Overal bed mobility: Needs Assistance Bed Mobility: Sit to Supine       Sit to supine: Supervision   General bed mobility comments: NT, pt in recliner  Transfers Overall transfer level: Needs assistance Equipment used: Rolling walker (2 wheeled) Transfers: Sit to/from Stand Sit to Stand: Min  assist         General transfer comment: VC for sequencing and hand/foot placement, pt endorsing dizziness worsening upon standing    Balance Overall balance assessment: Needs assistance Sitting-balance support: Feet supported;No upper extremity supported Sitting balance-Leahy Scale: Fair Sitting balance - Comments: pt reports dizziness in recliner with increased difficulty maintaining midline with tendency for L lateral lean, able to self correct Postural control: Left lateral lean;Posterior lean Standing balance support: Bilateral upper extremity supported Standing balance-Leahy Scale: Poor Standing balance comment: pt required BUE support on RW, initial posterior lean with slight L lateral lean requiring CGA to Min A to correct, unable to peform safely without RW, increased dizziness with standing                           ADL either performed or assessed with clinical judgement   ADL Overall ADL's : Needs assistance/impaired                                       General ADL Comments: Pt endorses continued difficulty with FMC/motor planning with LUE during tasks such as eating, grooming, using phone or remote for tv     Vision Baseline Vision/History: Wears glasses Wears Glasses: At all times Patient Visual Report: No change from baseline     Perception     Praxis      Cognition Arousal/Alertness: Awake/alert Behavior During Therapy: [facility] for tasks assessed/performed  Overall Cognitive Status: Within Functional Limits for tasks assessed                                          Exercises    Shoulder Instructions       General Comments Pt in recliner, head reclined, feet up and reporting dizziness - BP 113/65 HR 58. Upon standing pt reporting increasing dizziness BP 96/63 and HR 70. Once sitting EOB BP 131/71, HR 60. Once supine in bed BP 134/77 and HR 65 with pt reporting dizziness slowly improving    Pertinent Vitals/  Pain       Pain Assessment: No/denies pain Pain Score: 4  Pain Location: L elbow Pain Descriptors / Indicators: Sore Pain Intervention(s): Premedicated before session;Monitored during session  Home Living                                          Prior Functioning/Environment              Frequency  Min 3X/week        Progress Toward Goals  OT Goals(current goals can now be found in the care plan section)  Progress towards OT goals: Progressing toward goals  Acute Rehab OT Goals Patient Stated Goal: to get to rehab and regain independence  OT Goal Formulation: With patient Time For Goal Achievement: 11/24/19 Potential to Achieve Goals: Good  Plan Discharge plan remains appropriate;Frequency remains appropriate    Co-evaluation                 AM-PAC OT "6 Clicks" Daily Activity     Outcome Measure   Help from another person eating meals?: None Help from another person taking care of personal grooming?: A Little Help from another person toileting, which includes using toliet, bedpan, or urinal?: A Little Help from another person bathing (including washing, rinsing, drying)?: A Lot Help from another person to put on and taking off regular upper body clothing?: A Little Help from another person to put on and taking off regular lower body clothing?: A Lot 6 Click Score: 17    End of Session Equipment Utilized During Treatment: Gait belt;Rolling walker  OT Visit Diagnosis: Hemiplegia and hemiparesis;Repeated falls (R29.6);Ataxia, unspecified (R27.0) Hemiplegia - Right/Left: Left Hemiplegia - dominant/non-dominant: Non-Dominant Hemiplegia - caused by: Cerebral infarction   Activity Tolerance Patient tolerated treatment well;Other (comment)(limited by dizziness)   Patient Left in bed;with bed alarm set;with call bell/phone within reach   Nurse Communication Other (comment)(BPs)        Time: [id number] OT Time Calculation (min): 24  min  Charges: OT General Charges $OT Visit: 1 Visit OT Treatments $Therapeutic Activity: 23-37 mins  Jeni Salles, MPH, MS, OTR/L ascom [phone] 11/13/19, 3:18 PM

## 2019-11-13 NOTE — Progress Notes (Signed)
Inpatient Rehabilitation Admissions Coordinator  I have contacted pt and his wife at bedside by phone., I have arranged to meet them at 1230 today for rehab assessment.  Danne Baxter, RN, MSN Rehab Admissions Coordinator [phone] 11/13/2019 10:17 AM

## 2019-11-14 ENCOUNTER — Inpatient Hospital Stay: Payer: Medicare HMO

## 2019-11-14 ENCOUNTER — Encounter: Payer: Self-pay | Admitting: Internal Medicine

## 2019-11-14 DIAGNOSIS — J9601 Acute respiratory failure with hypoxia: Secondary | ICD-10-CM

## 2019-11-14 LAB — BASIC METABOLIC PANEL
Anion gap: 10 (ref 5–15)
BUN: 29 mg/dL — ABNORMAL HIGH (ref 8–23)
CO2: 24 mmol/L (ref 22–32)
Calcium: 9 mg/dL (ref 8.9–10.3)
Chloride: 99 mmol/L (ref 98–111)
Creatinine, Ser: 1.09 mg/dL (ref 0.61–1.24)
GFR calc Af Amer: >60 mL/min (ref >60)
GFR calc non Af Amer: >60 mL/min (ref >60)
Glucose, Bld: 146 mg/dL — ABNORMAL HIGH (ref 70–99)
Potassium: 4.1 mmol/L (ref 3.5–5.1)
Sodium: 133 mmol/L — ABNORMAL LOW (ref 135–145)

## 2019-11-14 LAB — GLUCOSE, CAPILLARY
Glucose-Capillary: 142 mg/dL — ABNORMAL HIGH (ref 70–99)
Glucose-Capillary: 203 mg/dL — ABNORMAL HIGH (ref 70–99)

## 2019-11-14 MED ORDER — PHENYLEPHRINE HCL 0.25 % NA SOLN
1.0000 | Freq: Four times a day (QID) | NASAL | Status: DC | PRN
  Administered 2019-11-14: 1 via NASAL
  Filled 2019-11-14 (×2): qty 15

## 2019-11-14 MED ORDER — AMLODIPINE BESYLATE 5 MG PO TABS
5.0000 mg | ORAL_TABLET | Freq: Every day | ORAL | Status: DC
  Administered 2019-11-14 – 2019-11-23 (×9): 5 mg via ORAL
  Filled 2019-11-14 (×9): qty 1

## 2019-11-14 NOTE — Progress Notes (Signed)
Patient c/o feeling SOB and reports he fels like the oxygen in his nose was making it hard to breath. Patient also reports feeling like he is "stopping up". Patient was given an incentive spirometer to see if it would help him get a deeper breath. Patient requested to stand on the side of the bed and it may possible help him feel better. RN assisted patient to stand on the side of the bed and noticed patient was very weak. Patient reported feeling dizzy and like he may pass out. Patient immediatly assisted back to bed. Patient sats noted 78%. Increased oxygen to 6L to assit with rapid increase of O2 sats with minimal improvement to 84%. Partial NRB placed for a rapid increase of sats and improve patient reports of SOB with increase to 92%. On call provider made aware.

## 2019-11-14 NOTE — Progress Notes (Signed)
Physical Therapy Treatment Patient Details Name: Francis Sims. MRN: [id number] DOB: April 02, 1941 Today's Date: 11/14/2019    History of Present Illness presented to ER secondary to acute onset of L UE weakness/numbness; admitted for TIA/CVA work -up.  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    PT Comments    Pt was long sitting in bed upon arriving. He agrees to PT session and is very motivated. Perfect CIR candidate 2/2 to deficits. Resting vitals: 128/63 sao2 94% on 2 L [place] and HR 89bpm. No symptoms of orthostatic hypotension this date. Did not c/o dizziness or lightheadedness throughout. He was able to exit L side of bed with min assist. Stood from elevated bed height with min assist + LUE HHA. Therapist protected L knee from buckling throughout but pt does have strength/ coordination to control from hyper extension with cueing. Pt ambulated 40 ft with LUE HHA + mod assist. Ataxic LLE advancement with cueing for decreased step length. He tolerated ambulation well but will need continued skilled PT to address deficits. Pt tolerated performing balance activities well but standing balance is fair-poor. Pt will benefit from CIR to address strength, coordination, balance, and safe functional mobility deficits. Pt is highly active prior to hospitalization. RN aware of pt's abilities at conclusion of session with pt sitting in recliner with call bell in reach.        Follow Up Recommendations  CIR     Equipment Recommendations  Other (comment)(defer to CIR/ next level of care)    Recommendations for Other Services       Precautions / Restrictions Precautions Precautions: Fall Precaution Comments: watch orthostatics Restrictions Weight Bearing Restrictions: No    Mobility  Bed Mobility Overal bed mobility: Needs Assistance Bed Mobility: Supine to Sit     Supine to sit: Min assist     General bed mobility comments: min assist with incraesed time and vcs for improved  technique/sequencing  Transfers Overall transfer level: Needs assistance Equipment used: Rolling walker (2 wheeled) Transfers: Sit to/from Stand Sit to Stand: Min assist         General transfer comment: LUE HHA with therapist blocking L knee to prevent buckling. pt stood with min assist. stood x several minutes prior to taking steps to recliner. mod assist to take steps  Ambulation/Gait Ambulation/Gait assistance: Mod assist Gait Distance (Feet): 40 Feet Assistive device: Rolling walker (2 wheeled) Gait Pattern/deviations: Step-to pattern;Ataxic;Staggering left;Decreased stance time - left Gait velocity: decreased   General Gait Details: Pt was able to ambulate 40 ft with +1 HHA with 2nd person managing IV/monitors. vitals stable throughout on 2 L . ataxic LLE step quality with L lateral lean when advancing RLE. therapist protecting L knee from buckling. slight knee hyperextension but with Vcs is able to improve controll   Stairs             Wheelchair Mobility    Modified Rankin (Stroke Patients Only)       Balance Overall balance assessment: Needs assistance Sitting-balance support: Bilateral upper extremity supported;Feet supported Sitting balance-Leahy Scale: Fair Sitting balance - Comments: was able to maintain sitting balance at EOB without assistance x 5 minutes " I'm so happy I can just sit without falling over."   Standing balance support: Single extremity supported;During functional activity(LUE HHA) Standing balance-Leahy Scale: Poor Standing balance comment: pt is unsteady in standing with L lateral lean and heavy reliance on LUE support by therapist to maintain balance  Cognition Arousal/Alertness: Awake/alert Behavior During Therapy: WFL for tasks assessed/performed Overall Cognitive Status: Within Functional Limits for tasks assessed                                 General Comments: Pt is A and  O x 4 and cooperative and pleasant throughout. Very motivated and great inpatient rehab candidate      Exercises      General Comments        Pertinent Vitals/Pain Pain Assessment: No/denies pain Pain Score: 0-No pain Pain Intervention(s): Limited activity within patient's tolerance;Monitored during session;Repositioned    Home Living                      Prior Function            PT Goals (current goals can now be found in the care plan section) Acute Rehab PT Goals Patient Stated Goal: to get to rehab and regain independence  Progress towards PT goals: Progressing toward goals    Frequency    7X/week      PT Plan Current plan remains appropriate    Co-evaluation              AM-PAC PT "6 Clicks" Mobility   Outcome Measure  Help needed turning from your back to your side while in a flat bed without using bedrails?: A Little Help needed moving from lying on your back to sitting on the side of a flat bed without using bedrails?: A Little Help needed moving to and from a bed to a chair (including a wheelchair)?: A Lot Help needed standing up from a chair using your arms (e.g., wheelchair or bedside chair)?: A Lot Help needed to walk in hospital room?: A Lot Help needed climbing 3-5 steps with a railing? : A Lot 6 Click Score: 14    End of Session Equipment Utilized During Treatment: Gait belt Activity Tolerance: Patient tolerated treatment well Patient left: in chair;with call bell/phone within reach;with chair alarm set Nurse Communication: Mobility status PT Visit Diagnosis: Difficulty in walking, not elsewhere classified (R26.2);Hemiplegia and hemiparesis Hemiplegia - Right/Left: Left Hemiplegia - dominant/non-dominant: Non-dominant Hemiplegia - caused by: Cerebral infarction     Time: [id number] PT Time Calculation (min) (ACUTE ONLY): 41 min  Charges:  $Gait Training: 8-22 mins $Therapeutic Activity: 8-22 mins $Neuromuscular  Re-education: 8-22 mins                     Julaine Fusi PTA 11/14/19, 12:35 PM

## 2019-11-14 NOTE — Progress Notes (Signed)
Patient denied pain. Lung sounds clear on right and diminished on the left. HOB elevated to 90 degrees for maximum respiratory efforts. Partial NRB in place and sats till only 92%. RN at bedside with patient.

## 2019-11-14 NOTE — Progress Notes (Signed)
Nurse tech informed RN that patient requestied, "Something to help get to sleep". On assessment patient also complained of anxiety. PRN ativan and ambien administered. Patient noted able to rest. Patient noted to desat to 88%. Oxygen applied at 2L via [place]. On call NP Ouma made aware. Will continue to monitor and endorse.

## 2019-11-14 NOTE — Progress Notes (Signed)
PROGRESS NOTE    Francis Sims.  [id number] DOB: [birth year] DOA: 11/09/2019 PCP: Cassandria Anger, MD   Chief Complaint  Patient presents with  . Fall  . Weakness    Brief Narrative:  79 year old male with situational mixed anxiety and depression, elevated blood pressure, prediabetes, BPH and GERD admitted with 1 day history of left-sided weakness and numbness.  Reportedly he also had 4 episodes of falls in the past 6 months without sustaining any injury or loss of consciousness. MRI of the brain showed acute small infarct in the posterior limb of the right internal capsule.   Assessment & Plan:   Principal Problem:   Acute ischemic right MCA stroke (HCC) Likely small vessel disease.  CTA  head and neck showed severe stenosis at right P1-P2 junction, no other significant stenosis.  2D echo shows EF of 60-35% with no wall motion abnormality and LVH. Neurology  recommends dual antiplatelet therapy with aspirin 81 mg and Plavix 75 mg for 3 weeks followed by Plavix monotherapy daily.  LDL of 135 but patient intolerant to statin.  A1c of 6.7. Allow permissive hypertension for now followed by aggressive blood pressure control. Outpatient follow-up with neurology/stroke clinic. PT/OT recommends CIR given some residual left lower extremity weakness and severe gait impairment.  Awaiting insurance authorization.  Active Problems: Acute hypoxia on 6/1 Overnight patient found to be hypoxic and complained of shortness of breath. Found to have oxygen desaturation to 78% requiring 6 L via nasal cannula and briefly NRB. Chest x-ray done was negative for any infiltrate or pulmonary edema. Symptoms resolved and patient maintaining sats on room air. Recent 2D echo with normal EF. IV fluids discontinued. No signs of volume overload on exam.  Uncontrolled/accelerated hypertension Diagnosis of whitecoat hypertension per primary.  On low-dose amlodipine (patient reports that he was taken off  by PCP).    Allowing permissive hypertension for now and subsequently needs tight blood pressure control. I will resume his home dose amlodipine.  Situational mixed anxiety and depressive disorder On low-dose Ativan as needed.  BPH Continue Flomax    DVT prophylaxis: Subcu Lovenox Code Status: Full code Family Communication: Wife involved in care Disposition:   Status is: Inpatient  Remains inpatient appropriate because:Unsafe d/c plan. awaiting insurance authorization to CIR.   Dispo: The patient is from: Home              Anticipated d/c is to: CIR              Anticipated d/c date is: 1 day              Patient currently is not medically stable to d/c.        Consultants:   Neurology  Procedures: CT head, MRI brain, CT angiogram head and neck, 2D echo  Antimicrobials: None   Subjective: Seen and examined. Became hypoxic with O2 sat dropped to 78% requiring nasal cannula and then NRB briefly. Chest x-ray without acute finding. Symptoms resolved this morning. Denies any chest discomfort or shortness of breath. Objective: Vitals:   11/14/19 0231 11/14/19 0408 11/14/19 0505 11/14/19 0752  BP: (!) 122/46 128/68 (!) 146/72 128/63  Pulse: 69 65  71  Resp: 18 11  20   Temp: 99.1 F (37.3 C) 97.6 F (36.4 C)  97.9 F (36.6 C)  TempSrc: Oral Oral  Oral  SpO2: 95% 98%  97%  Weight:      Height:        Intake/Output Summary (Last  24 hours) at 11/14/2019 1520 Last data filed at 11/14/2019 1435 Gross per 24 hour  Intake 720 ml  Output 350 ml  Net 370 ml   Filed Weights   11/09/19 0950  Weight: 88.5 kg   Physical exam General: Not in distress HEENT: Moist mucosa, supple neck Chest: Clear bilaterally CVs: Normal S1-S2 GI: Soft, nondistended, nontender Musculoskeletal: Warm, no edema, CNS: 5/5 power in bilateral extremities with diminished sensation in left lower leg, improved sensation in left hand    Data Reviewed: I have personally reviewed following  labs and imaging studies  CBC: Recent Labs  Lab 11/09/19 0958  WBC 8.6  NEUTROABS 5.9  HGB 16.3  HCT 47.8  MCV 83.1  PLT [id number]    Basic Metabolic Panel: Recent Labs  Lab 11/09/19 0958 11/14/19 0724  NA 139 133*  K 3.9 4.1  CL 104 99  CO2 26 24  GLUCOSE 144* 146*  BUN 19 29*  CREATININE 0.75 1.09  CALCIUM 8.9 9.0  MG 2.0  --     GFR: Estimated Creatinine Clearance: 67 mL/min (by C-G formula based on SCr of 1.09 mg/dL).  Liver Function Tests: Recent Labs  Lab 11/09/19 0958  AST 26  ALT 24  ALKPHOS 85  BILITOT 1.6*  PROT 8.2*  ALBUMIN 4.6    CBG: Recent Labs  Lab 11/09/19 1003 11/14/19 0802 11/14/19 1148  GLUCAP 145* 142* 203*     Recent Results (from the past 240 hour(s))  SARS Coronavirus 2 by RT PCR (hospital order, performed in [facility] hospital lab) Nasopharyngeal Nasopharyngeal Swab     Status: None   Collection Time: 11/09/19  4:45 PM   Specimen: Nasopharyngeal Swab  Result Value Ref Range Status   SARS Coronavirus 2 NEGATIVE NEGATIVE Final    Comment: (NOTE) SARS-CoV-2 target nucleic acids are NOT DETECTED. The SARS-CoV-2 RNA is generally detectable in upper and lower respiratory specimens during the acute phase of infection. The lowest concentration of SARS-CoV-2 viral copies this assay can detect is 250 copies / mL. A negative result does not preclude SARS-CoV-2 infection and should not be used as the sole basis for treatment or other patient management decisions.  A negative result may occur with improper specimen collection / handling, submission of specimen other than nasopharyngeal swab, presence of viral mutation(s) within the areas targeted by this assay, and inadequate number of viral copies (<250 copies / mL). A negative result must be combined with clinical observations, patient history, and epidemiological information. Fact Sheet for Patients:   StrictlyIdeas.no Fact Sheet for Healthcare  Providers: BankingDealers.co.za This test is not yet approved or cleared  by the [place] FDA and has been authorized for detection and/or diagnosis of SARS-CoV-2 by FDA under an Emergency Use Authorization (EUA).  This EUA will remain in effect (meaning this test can be used) for the duration of the COVID-19 declaration under Section 564(b)(1) of the Act, 21 U.S.C. section 360bbb-3(b)(1), unless the authorization is terminated or revoked sooner. Performed at [facility], [address], [place], [place] [address]          Radiology Studies: DG Chest 1 View  Result Date: 11/14/2019 CLINICAL DATA:  Shortness of breath EXAM: CHEST  1 VIEW COMPARISON:  11/09/2019 FINDINGS: Cardiac shadow is within normal limits. The lungs are clear bilaterally. No bony abnormality is noted. Postsurgical changes in the cervical spine are seen. IMPRESSION: No acute abnormality noted. Electronically Signed   By: Inez Catalina M.D.   On: 11/14/2019  03:02        Scheduled Meds: .  stroke: mapping our early stages of recovery book   Does not apply Once  . vitamin C  250 mg Oral Daily  . aspirin  81 mg Oral Daily  . cholecalciferol  1,000 Units Oral Daily  . clopidogrel  75 mg Oral Daily  . enoxaparin (LOVENOX) injection  40 mg Subcutaneous Q24H  . feeding supplement (ENSURE ENLIVE)  237 mL Oral BID BM  . hydrALAZINE  50 mg Oral Q6H  . loratadine  10 mg Oral Daily  . melatonin  5 mg Oral QHS  . metoprolol tartrate  25 mg Oral BID  . multivitamin with minerals  1 tablet Oral Daily  . olopatadine  1 drop Both Eyes BID  . omega-3 acid ethyl esters  1 g Oral Daily  . tamsulosin  0.4 mg Oral Daily   Continuous Infusions:    LOS: 5 days    Time spent: 25 minutes    Suzana Sohail, MD [facility]   To contact the attending provider between 7A-7P or the covering provider during after hours 7P-7A, please log into the web site [email or web address] and access  using universal [place] password for that web site. If you do not have the password, please call the hospital operator.  11/14/2019, 3:20 PM
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
## 2019-11-15 MED ORDER — ONDANSETRON 8 MG PO TBDP
8.0000 mg | ORAL_TABLET | Freq: Three times a day (TID) | ORAL | Status: DC | PRN
  Filled 2019-11-15: qty 1

## 2019-11-15 MED ORDER — ONDANSETRON HCL 4 MG/2ML IJ SOLN
4.0000 mg | Freq: Four times a day (QID) | INTRAMUSCULAR | Status: DC | PRN
  Administered 2019-11-15: 10:00:00 4 mg via INTRAVENOUS
  Filled 2019-11-15: qty 2

## 2019-11-15 NOTE — Progress Notes (Signed)
PROGRESS NOTE    Francis Sims.  [id number] DOB: 09/07/1940 DOA: 11/09/2019 PCP: Cassandria Anger, MD       Assessment & Plan:   Principal Problem:   Acute ischemic right MCA stroke [facility]) Active Problems:   PERIPHERAL VASCULAR DISEASE   Dyslipidemia   Situational mixed anxiety and depressive disorder   Accelerated hypertension   Stroke (cerebrum) (HCC)   Acute ischemic right MCA CVA: CTA head and neck showed severe stenosis at right P1-P2 junction, no other significant stenosis. Echo shows EF of 60-35% with no wall motion abnormality and LVH. Neurology  recommends dual antiplatelet therapy with aspirin & plavix x 3 weeks followed by plavix monotherapy daily. Pt intolerant to statin. PT/OT recommends CIR given some residual left lower extremity weakness and severe gait impairment.  Awaiting insurance authorization.  Acute hypoxia: overnight patient found to be hypoxic and complained of shortness of breath. Found to have oxygen desaturation to 78% requiring 6 L via nasal cannula and briefly NRB. Chest x-ray done was negative for any infiltrate or pulmonary edema. Symptoms resolved and patient maintaining sats on room air. Recent 2D echo with normal EF. IV fluids discontinued. Resolved  Uncontrolled HTN: continue on amlodipine   Anxiety and depressive disorder: severity unknown. Ativan prn.  BPH: continue Flomax   DVT prophylaxis:  lovenox Code Status: full  Family Communication:  Disposition Plan: d/c to CIR but waiting on insurance auth    Consultants:   neuro   Procedures:    Antimicrobials:    Subjective: Pt c/o malaise   Objective: Vitals:   11/14/19 1827 11/14/19 2340 11/15/19 0524 11/15/19 0745  BP: (!) 137/59 122/66 (!) 135/57 130/65  Pulse: 82 66  72  Resp: 18 16  18   Temp: 98.1 F (36.7 C) 97.9 F (36.6 C)  98.5 F (36.9 C)  TempSrc: Oral     SpO2: 97% 95%  95%  Weight:      Height:        Intake/Output Summary (Last 24  hours) at 11/15/2019 0817 Last data filed at 11/15/2019 0529 Gross per 24 hour  Intake 480 ml  Output 750 ml  Net -270 ml   Filed Weights   11/09/19 0950  Weight: 88.5 kg    Examination:  General exam: Appears calm and comfortable  Respiratory system: Clear to auscultation. Respiratory effort normal. Cardiovascular system: S1 & S2 +. No rubs, gallops or clicks.  Gastrointestinal system: Abdomen is nondistended, soft and nontender. Normal bowel sounds heard. Central nervous system: Alert and oriented. Moves all 4 extremities  Psychiatry: Judgement and insight appear normal. Mood & affect appropriate.     Data Reviewed: I have personally reviewed following labs and imaging studies  CBC: Recent Labs  Lab 11/09/19 0958  WBC 8.6  NEUTROABS 5.9  HGB 16.3  HCT 47.8  MCV 83.1  PLT [id number]   Basic Metabolic Panel: Recent Labs  Lab 11/09/19 0958 11/14/19 0724  NA 139 133*  K 3.9 4.1  CL 104 99  CO2 26 24  GLUCOSE 144* 146*  BUN 19 29*  CREATININE 0.75 1.09  CALCIUM 8.9 9.0  MG 2.0  --    GFR: Estimated Creatinine Clearance: 67 mL/min (by C-G formula based on SCr of 1.09 mg/dL). Liver Function Tests: Recent Labs  Lab 11/09/19 0958  AST 26  ALT 24  ALKPHOS 85  BILITOT 1.6*  PROT 8.2*  ALBUMIN 4.6   No results for input(s): LIPASE, AMYLASE in the last 168 hours.  No results for input(s): AMMONIA in the last 168 hours. Coagulation Profile: Recent Labs  Lab 11/09/19 0958  INR 1.0   Cardiac Enzymes: No results for input(s): CKTOTAL, CKMB, CKMBINDEX, TROPONINI in the last 168 hours. BNP (last 3 results) No results for input(s): PROBNP in the last 8760 hours. HbA1C: No results for input(s): HGBA1C in the last 72 hours. CBG: Recent Labs  Lab 11/09/19 1003 11/14/19 0802 11/14/19 1148  GLUCAP 145* 142* 203*   Lipid Profile: No results for input(s): CHOL, HDL, LDLCALC, TRIG, CHOLHDL, LDLDIRECT in the last 72 hours. Thyroid Function Tests: No results for  input(s): TSH, T4TOTAL, FREET4, T3FREE, THYROIDAB in the last 72 hours. Anemia Panel: No results for input(s): VITAMINB12, FOLATE, FERRITIN, TIBC, IRON, RETICCTPCT in the last 72 hours. Sepsis Labs: No results for input(s): PROCALCITON, LATICACIDVEN in the last 168 hours.  Recent Results (from the past 240 hour(s))  SARS Coronavirus 2 by RT PCR (hospital order, performed in [facility] hospital lab) Nasopharyngeal Nasopharyngeal Swab     Status: None   Collection Time: 11/09/19  4:45 PM   Specimen: Nasopharyngeal Swab  Result Value Ref Range Status   SARS Coronavirus 2 NEGATIVE NEGATIVE Final    Comment: (NOTE) SARS-CoV-2 target nucleic acids are NOT DETECTED. The SARS-CoV-2 RNA is generally detectable in upper and lower respiratory specimens during the acute phase of infection. The lowest concentration of SARS-CoV-2 viral copies this assay can detect is 250 copies / mL. A negative result does not preclude SARS-CoV-2 infection and should not be used as the sole basis for treatment or other patient management decisions.  A negative result may occur with improper specimen collection / handling, submission of specimen other than nasopharyngeal swab, presence of viral mutation(s) within the areas targeted by this assay, and inadequate number of viral copies (<250 copies / mL). A negative result must be combined with clinical observations, patient history, and epidemiological information. Fact Sheet for Patients:   StrictlyIdeas.no Fact Sheet for Healthcare Providers: BankingDealers.co.za This test is not yet approved or cleared  by the [place] FDA and has been authorized for detection and/or diagnosis of SARS-CoV-2 by FDA under an Emergency Use Authorization (EUA).  This EUA will remain in effect (meaning this test can be used) for the duration of the COVID-19 declaration under Section 564(b)(1) of the Act, 21 U.S.C. section  360bbb-3(b)(1), unless the authorization is terminated or revoked sooner. Performed at [facility], [address], [place], [place] [address]          Radiology Studies: DG Chest 1 View  Result Date: 11/14/2019 CLINICAL DATA:  Shortness of breath EXAM: CHEST  1 VIEW COMPARISON:  11/09/2019 FINDINGS: Cardiac shadow is within normal limits. The lungs are clear bilaterally. No bony abnormality is noted. Postsurgical changes in the cervical spine are seen. IMPRESSION: No acute abnormality noted. Electronically Signed   By: Inez Catalina M.D.   On: 11/14/2019 03:02        Scheduled Meds: .  stroke: mapping our early stages of recovery book   Does not apply Once  . amLODipine  5 mg Oral Daily  . vitamin C  250 mg Oral Daily  . aspirin  81 mg Oral Daily  . cholecalciferol  1,000 Units Oral Daily  . clopidogrel  75 mg Oral Daily  . enoxaparin (LOVENOX) injection  40 mg Subcutaneous Q24H  . feeding supplement (ENSURE ENLIVE)  237 mL Oral BID BM  . hydrALAZINE  50 mg Oral Q6H  .  loratadine  10 mg Oral Daily  . melatonin  5 mg Oral QHS  . metoprolol tartrate  25 mg Oral BID  . multivitamin with minerals  1 tablet Oral Daily  . olopatadine  1 drop Both Eyes BID  . omega-3 acid ethyl esters  1 g Oral Daily  . tamsulosin  0.4 mg Oral Daily   Continuous Infusions:   LOS: 6 days    Time spent: 33 mins     Wyvonnia Dusky, MD [facility] Pager [phone]  If 7PM-7AM, please contact night-coverage [email or web address] 11/15/2019, 8:17 AM

## 2019-11-15 NOTE — Progress Notes (Signed)
Occupational Therapy Treatment Patient Details Name: Francis Sims. MRN: [id number] DOB: 01-31-1941 Today's Date: 11/15/2019    History of present illness presented to ER secondary to acute onset of L UE weakness/numbness; admitted for TIA/CVA work -up.  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   OT comments  Francis Sims presents this morning very motivated to participate in therapy, requesting to brush his teeth. He completed supine to sit at EOB with Mod I, HOB elevated, RUE bedrail use and self-verbal cueing his L side. Pt brushed his teeth, completed UB/LB bathing and donned a clean gown while sitting EOB with Setup A for preparation and Supervision for safety. Min A provided for washing his back. Throughout, pt demonstrated improved sitting balance, though still consistently requiring UE support on bed for trunk control and upright posture. Pt frequently self-verbally cues his L side for positioning, movement and support. Pt completed sit <> stand t/f with Min A for achieving stability and maintaining balance. He struggled to weight shift off his L foot, shuffling for lateral ambulation, with legs supported against the bed and Min A for balance. Left lateral lean noted.   Pt educated on stroke recovery, L side use and optimal positioning in order to maximize recovery and return to PLOF. Pt very receptive to education and eager to implement. Pt continues to present with L sided weakness, decreased motor coordination and poor standing balance, all impacting his ability to engage in ADL/IADL at his baseline level of independence. He will continue to benefit from skilled acute OT services to address these functional impairments. Continue to recommend CIR upon discharge to optimize pt outcomes and return to PLOF.    Follow Up Recommendations  CIR    Equipment Recommendations  None recommended by OT    Recommendations for Other Services      Precautions / Restrictions  Precautions Precautions: Fall Precaution Comments: watch orthostatics Restrictions Weight Bearing Restrictions: No       Mobility Bed Mobility Overal bed mobility: Needs Assistance Bed Mobility: Supine to Sit     Supine to sit: Supervision Sit to supine: Supervision   General bed mobility comments: pt requires increased time, concentration for L side and additional R side effort to compensate for L side weakness; increased effort needed for LLE mgt for sit to supine  Transfers Overall transfer level: Needs assistance Equipment used: Rolling walker (2 wheeled) Transfers: Sit to/from Stand Sit to Stand: Min assist         General transfer comment: verbal cues provided for improved technique; Min A for initial balance and Min A for maintaining balance during lateral steps; pt struggles to lift and weight shift off L foot, shuffles laterally    Balance Overall balance assessment: Needs assistance Sitting-balance support: Single extremity supported;Feet supported Sitting balance-Leahy Scale: Fair Sitting balance - Comments: maintained sitting balance at EOB without assistance ~25 min during hygiene (brushing teeth, UB/LB bathing), intermittent and alternating posterior and L lateral lean, improved with verbal cues; pt endorsed increasing L side fatigue Postural control: Left lateral lean;Posterior lean Standing balance support: Single extremity supported Standing balance-Leahy Scale: Poor Standing balance comment: pt required Min A for maintaining standing balance, difficulties with weight shifting off L foot and shuffles laterally to compensate                           ADL either performed or assessed with clinical judgement   ADL Overall ADL's : Needs assistance/impaired  Grooming: Sitting;Set up;Applying deodorant;Oral care;Wash/dry face(Cues for L side engagement) Grooming Details (indicate cue type and reason): Pt sat at EOB without physical support  during hygiene routine. Min cues provided for adjusting posture and optimal L side engagement. Upper Body Bathing: Minimal assistance;Sitting Upper Body Bathing Details (indicate cue type and reason): Min A for reaching his back, Setup A otherwise Lower Body Bathing: Set up;Sitting/lateral leans Lower Body Bathing Details (indicate cue type and reason): impaired LUE weakness/coordination during, posterior lean for compensation noted Upper Body Dressing : Set up;Sitting Upper Body Dressing Details (indicate cue type and reason): donned gown with Setup A at EOB                 Functional mobility during ADLs: Minimal assistance;Cueing for sequencing General ADL Comments: Pt endorses continued difficulty with FMC/motor planning with LUE during hygiene routine and bed mobility; LLE shuffling and difficulties weight shifting noted that would impact functional transfers     Vision Baseline Vision/History: Wears glasses Wears Glasses: At all times Patient Visual Report: No change from baseline     Perception     Praxis      Cognition Arousal/Alertness: Awake/alert Behavior During Therapy: WFL for tasks assessed/performed Overall Cognitive Status: Within Functional Limits for tasks assessed                                 General Comments: Pt is A and O x 4 and cooperative and pleasant throughout. Very motivated and great inpatient rehab candidate        Exercises Other Exercises Other Exercises: Sitting dynamic balance involving reaching, b/l coordination and [place] across midline during hygiene routine at EOB Other Exercises: Pt educated on stroke recovery, L side use and optimal positioning in order to maximize recovery and return to PLOF. Pt very receptive to education and eager to implement.   Shoulder Instructions       General Comments      Pertinent Vitals/ Pain       Pain Assessment: No/denies pain Pain Score: 0-No pain Pain Intervention(s): Monitored  during session  Home Living                           Bathroom Accessibility: Yes How Accessible: Accessible via walker        Lives With: Spouse    Prior Functioning/Environment              Frequency  Min 3X/week        Progress Toward Goals  OT Goals(current goals can now be found in the care plan section)  Progress towards OT goals: Progressing toward goals  Acute Rehab OT Goals Patient Stated Goal: to get to rehab and regain independence  OT Goal Formulation: With patient Time For Goal Achievement: 11/24/19 Potential to Achieve Goals: Good  Plan Discharge plan remains appropriate;Frequency remains appropriate    Co-evaluation                 AM-PAC OT "6 Clicks" Daily Activity     Outcome Measure   Help from another person eating meals?: None Help from another person taking care of personal grooming?: A Little Help from another person toileting, which includes using toliet, bedpan, or urinal?: A Lot(a little for urinal, a lot for toileting) Help from another person bathing (including washing, rinsing, drying)?: A Little Help from another person to put on and  taking off regular upper body clothing?: A Little Help from another person to put on and taking off regular lower body clothing?: A Lot 6 Click Score: 17    End of Session Equipment Utilized During Treatment: Gait belt  OT Visit Diagnosis: Hemiplegia and hemiparesis;Repeated falls (R29.6);Ataxia, unspecified (R27.0) Hemiplegia - Right/Left: Left Hemiplegia - dominant/non-dominant: Non-Dominant Hemiplegia - caused by: Cerebral infarction   Activity Tolerance Patient tolerated treatment well   Patient Left in bed;with bed alarm set;with call bell/phone within reach   Nurse Communication          Time: 1010-1049 OT Time Calculation (min): 39 min  Charges: OT Treatments $Self Care/Home Management : 38-52 mins  Jerilynn Birkenhead, OTS 11/15/19, 12:01 PM

## 2019-11-15 NOTE — Progress Notes (Signed)
Physical Therapy Treatment Patient Details Name: Francis Sims. MRN: [id number] DOB: [birth year] Today's Date: 11/15/2019    History of Present Illness presented to ER secondary to acute onset of L UE weakness/numbness; admitted for TIA/CVA work -up.  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    PT Comments    Pt was supine in bed upon arriving. He is very motivated to participate in PT session and requested to go to BR for BM. He was able to exit R side of bed with min-mod assist + increased time and constant vcs for improved technique. Sat EOB x several minutes with minimal report of dizziness. BP 127/68 in sitting. Dizziness resolved after ~ 2 minutes. He stood with min assist + moderate vcs and ambulated to BR ~ 12 ft. Pt had successful BM and then ambulated to recliner. Pt continues to have unsteady gait and ataxic LLE movements during swing phase of gait. Vcs throughout for posture correction and overall improved gait sequencing. Pt continues to have strength, coordination, balance, and safe functional mobility deficits. Therapist feel pt will greatly benefit from CIR at DC to address these deficits and assist pt to returning to PLOF. He was seated in recliner with call bell in reach, chair alarm in place, and RN aware of his abilities.       Follow Up Recommendations  CIR     Equipment Recommendations  Other (comment)(defer to next level of care)    Recommendations for Other Services       Precautions / Restrictions Precautions Precautions: Fall Precaution Comments: watch orthostatics Restrictions Weight Bearing Restrictions: No    Mobility  Bed Mobility Overal bed mobility: Needs Assistance Bed Mobility: Supine to Sit     Supine to sit: Min assist;Mod assist     General bed mobility comments: min-mod assist to progress form supine to sitting. Pt sat EOB x several minutes with BP 127/68. reports slight dizziness that resolved after 2 minutes.    Transfers Overall transfer level: Needs assistance Equipment used: Rolling walker (2 wheeled) Transfers: Sit to/from Stand Sit to Stand: Min assist         General transfer comment: Pt required min assist + moderate Vcs for improved technique and sequencing.   Ambulation/Gait Ambulation/Gait assistance: Min assist Gait Distance (Feet): 20 Feet Assistive device: Rolling walker (2 wheeled) Gait Pattern/deviations: Step-to pattern;Ataxic;Staggering left;Decreased stance time - left Gait velocity: decreased   General Gait Details: pt was able to ambulate to BR and then to recliner from BR. he fatigued quickly. pt has ataxic LLE advancement during swing phase of gait. heavy reliance of RW for support.   Stairs             Wheelchair Mobility    Modified Rankin (Stroke Patients Only)       Balance Overall balance assessment: Needs assistance Sitting-balance support: Bilateral upper extremity supported;Feet supported Sitting balance-Leahy Scale: Fair     Standing balance support: Bilateral upper extremity supported;During functional activity Standing balance-Leahy Scale: Fair Standing balance comment: heavy reliance on RW for support                            Cognition Arousal/Alertness: Awake/alert Behavior During Therapy: WFL for tasks assessed/performed Overall Cognitive Status: Within Functional Limits for tasks assessed  General Comments: Pt is A and O x 4 and cooperative and pleasant throughout. Very motivated and great inpatient rehab candidate      Exercises      General Comments        Pertinent Vitals/Pain Pain Assessment: No/denies pain Pain Score: 0-No pain Pain Intervention(s): Monitored during session    Home Living                      Prior Function            PT Goals (current goals can now be found in the care plan section) Acute Rehab PT Goals Patient Stated  Goal: to get to rehab and regain independence  Progress towards PT goals: Progressing toward goals    Frequency    7X/week      PT Plan Current plan remains appropriate    Co-evaluation              AM-PAC PT "6 Clicks" Mobility   Outcome Measure  Help needed turning from your back to your side while in a flat bed without using bedrails?: A Little Help needed moving from lying on your back to sitting on the side of a flat bed without using bedrails?: A Little Help needed moving to and from a bed to a chair (including a wheelchair)?: A Lot Help needed standing up from a chair using your arms (e.g., wheelchair or bedside chair)?: A Lot Help needed to walk in hospital room?: A Lot Help needed climbing 3-5 steps with a railing? : A Lot 6 Click Score: 14    End of Session Equipment Utilized During Treatment: Gait belt Activity Tolerance: Patient tolerated treatment well Patient left: in chair;with call bell/phone within reach;with chair alarm set Nurse Communication: Mobility status PT Visit Diagnosis: Difficulty in walking, not elsewhere classified (R26.2);Hemiplegia and hemiparesis Hemiplegia - Right/Left: Left Hemiplegia - dominant/non-dominant: Non-dominant Hemiplegia - caused by: Cerebral infarction     Time: 0840-0920 PT Time Calculation (min) (ACUTE ONLY): 40 min  Charges:  $Gait Training: 8-22 mins $Therapeutic Activity: 23-37 mins                     Julaine Fusi PTA 11/15/19, 10:18 AM

## 2019-11-16 LAB — CBC
HCT: 45.4 % (ref 39.0–52.0)
Hemoglobin: 15.3 g/dL (ref 13.0–17.0)
MCH: 28.4 pg (ref 26.0–34.0)
MCHC: 33.7 g/dL (ref 30.0–36.0)
MCV: 84.2 fL (ref 80.0–100.0)
Platelets: 252 10*3/uL (ref 150–400)
RBC: 5.39 MIL/uL (ref 4.22–5.81)
RDW: 13.5 % (ref 11.5–15.5)
WBC: 9.3 10*3/uL (ref 4.0–10.5)
nRBC: 0 % (ref 0.0–0.2)

## 2019-11-16 LAB — BASIC METABOLIC PANEL
Anion gap: 9 (ref 5–15)
BUN: 33 mg/dL — ABNORMAL HIGH (ref 8–23)
CO2: 28 mmol/L (ref 22–32)
Calcium: 9.2 mg/dL (ref 8.9–10.3)
Chloride: 99 mmol/L (ref 98–111)
Creatinine, Ser: 1.11 mg/dL (ref 0.61–1.24)
GFR calc Af Amer: >60 mL/min (ref >60)
GFR calc non Af Amer: >60 mL/min (ref >60)
Glucose, Bld: 138 mg/dL — ABNORMAL HIGH (ref 70–99)
Potassium: 4.3 mmol/L (ref 3.5–5.1)
Sodium: 136 mmol/L (ref 135–145)

## 2019-11-16 MED ORDER — ENSURE ENLIVE PO LIQD
237.0000 mL | Freq: Three times a day (TID) | ORAL | Status: DC
  Administered 2019-11-17 – 2019-11-23 (×12): 237 mL via ORAL

## 2019-11-16 NOTE — Progress Notes (Signed)
Nutrition Follow-up  DOCUMENTATION CODES:   Not applicable  INTERVENTION:  Increase to Ensure Enlive po TID, each supplement provides 350 kcal and 20 grams of protein.   Continue daily MVI.  NUTRITION DIAGNOSIS:   Inadequate oral intake related to decreased appetite, social / environmental circumstances(social stressors this past year) as evidenced by per patient/family report.  New nutrition diagnosis.  GOAL:   Patient will meet greater than or equal to 90% of their needs  Met with oral nutrition supplements.  MONITOR:   PO intake, Supplement acceptance, Labs, Weight trends, I & O's  REASON FOR ASSESSMENT:   Malnutrition Screening Tool    ASSESSMENT:   79 year old male with PMHx of HTN, GERD, situational mixed anxiety and depression, prediabetes admitted who presented with left-sided weakness and numbness found to have acute ischemic right MCA CVA.  -Pending transfer to CIR once insurance approval is obtained.  Met with patient and his wife at bedside. Patient reports he is tired of the food here. He reports he is eating at least 50% of his meals. According to chart he is finishing about 50-75% of meals. He enjoys the Ensure and is drinking 2-3 bottles daily. He had 3 Ensures today. In the past 24 hours patient has had approximately 2405 kcal (100% estimated needs) and 119 grams of protein (100% estimated needs). Patient only had 1355 kcal and 59 kcal from meals, so Ensure Enlive is still needed to meet calorie/protein needs. Discussed other breakfast options on menu patient may enjoy that are high in protein (Greek yogurt). Patient's UBW was 218 lbs. According to chart he was 216 lbs on 05/17/2018. Patient has now lost down to 195 lbs. That is a weight loss of 21 lbs (9.7% body weight) over >1 year, which is not significant for time frame, but is still concerning. Patient's family has had a very stressful year, which is why he lost weight. He lost his son in 06/2018 and then his  wife was diagnosed with cancer.  Medications reviewed and include: vitamin D3 1000 units daily, vitamin C 250 mg daily, MVI daily, Lovaza 1 gram daily.  Labs reviewed: CBG 142-203, BUN 33.  Patient does not meet criteria for malnutrition at this time but is at risk for malnutrition.  NUTRITION - FOCUSED PHYSICAL EXAM:    Most Recent Value  Orbital Region  No depletion  Upper Arm Region  Mild depletion  Thoracic and Lumbar Region  No depletion  Buccal Region  No depletion  Temple Region  Mild depletion  Clavicle Bone Region  No depletion  Clavicle and Acromion Bone Region  No depletion  Scapular Bone Region  No depletion  Dorsal Hand  Mild depletion  Patellar Region  No depletion  Anterior Thigh Region  No depletion  Posterior Calf Region  No depletion  Edema (RD Assessment)  None  Hair  Reviewed  Eyes  Reviewed  Mouth  Reviewed  Skin  Reviewed  Nails  Reviewed     Diet Order:   Diet Order            Diet Heart Room service appropriate? Yes; Fluid consistency: Thin  Diet effective now             EDUCATION NEEDS:   No education needs have been identified at this time  Skin:  Skin Assessment: Reviewed RN Assessment  Last BM:  11/15/2019  Height:   Ht Readings from Last 1 Encounters:  11/09/19 '6\' 2"'  (1.88 m)   Weight:  Wt Readings from Last 1 Encounters:  11/09/19 88.5 kg   Ideal Body Weight:  86.4 kg  BMI:  Body mass index is 25.04 kg/m.  Estimated Nutritional Needs:   Kcal:  2200-2400  Protein:  115-130 grams  Fluid:  > 2.2 L  Jacklynn Barnacle, MS, RD, LDN Pager number available on Amion

## 2019-11-16 NOTE — Progress Notes (Signed)
Inpatient Rehabilitation Admissions Coordinator  I have received an initial denial form [employer] for CIR admit. I have notified Dr. Jimmye Norman, acute team, [facility] team as well as patient . I am arranging a peer to peer with Dr. Jimmye Norman and Dr. Allyson Sabal at [facility] for tomorrow morning.   Danne Baxter, RN, MSN Rehab Admissions Coordinator [phone] 11/16/2019 5:25 PM

## 2019-11-16 NOTE — Progress Notes (Signed)
Inpatient Rehabilitation Admissions Coordinator  I continue to await insurance approval for a possible inpt rehab admit. I am providing updates clinicals daily for possible approval.  Danne Baxter, RN, MSN Rehab Admissions Coordinator [phone] 11/16/2019 12:48 PM

## 2019-11-16 NOTE — Care Management Important Message (Signed)
Important Message  Patient Details  Name: Francis Sims. MRN: [id number] Date of Birth: [birth year]   Medicare Important Message Given:  Yes     Dannette Barbara 11/16/2019, 1:12 PM

## 2019-11-16 NOTE — Progress Notes (Signed)
Physical Therapy Treatment Patient Details Name: Francis Sims. MRN: [id number] DOB: [birth year] Today's Date: 11/16/2019    History of Present Illness presented to ER secondary to acute onset of L UE weakness/numbness; admitted for TIA/CVA work -up.  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    PT Comments    Pt was supine in bed upon arriving. He agrees to PT session and is cooperative/motivated throughout. Reports having low back pain and requesting to get OOB to urinate. He was able to exit bed with min assist for safety 2/2 to pt needing to get up in a hurry to urinate. Sat EOB prior to standing to RW with CGA. He was able to ambulate with RW 50 ft with min assist for safety. Pt has less ataxic gait pattern but still struggles with LLE placement and coordination. Overall tolerate gait well but does report fatigue. He will benefit from CIR at DC to address balance, strength, and coordination deficits.  He was supine in bed at conclusion of session 2/2 to requesting to take a nap after poor night sleep previous night.      Follow Up Recommendations  CIR     Equipment Recommendations  Other (comment)(defer to next level of care)    Recommendations for Other Services       Precautions / Restrictions Precautions Precautions: Fall Precaution Comments: watch orthostatics Restrictions Weight Bearing Restrictions: No    Mobility  Bed Mobility Overal bed mobility: Needs Assistance Bed Mobility: Supine to Sit     Supine to sit: Min assist Sit to supine: Min assist   General bed mobility comments: pt did require min assist + increased time to progress from supine to sitting and then sit to supine after OOB activity.  Transfers Overall transfer level: Needs assistance Equipment used: Rolling walker (2 wheeled) Transfers: Sit to/from Stand Sit to Stand: Min guard         General transfer comment: CGA for safety from elevated bed height. vcs for handplacement, fwd wt  shift, and overall improved safety  Ambulation/Gait Ambulation/Gait assistance: Min assist Gait Distance (Feet): 50 Feet Assistive device: Rolling walker (2 wheeled) Gait Pattern/deviations: Step-to pattern;Ataxic;Staggering left;Decreased stance time - left Gait velocity: decreased   General Gait Details: less ataxia this date overall but pt was able to ambulate ~ 50 ft total with use of RW + constant vcs for R lateral wt shift when advancing LLE. pt continues to have inconsistent step quality with less L knee buckling overall. Continues to fatigue quickly.    Stairs             Wheelchair Mobility    Modified Rankin (Stroke Patients Only)       Balance Overall balance assessment: Needs assistance Sitting-balance support: Feet supported Sitting balance-Leahy Scale: Fair Sitting balance - Comments: no LOB in sitting   Standing balance support: Bilateral upper extremity supported Standing balance-Leahy Scale: Fair                              Cognition Arousal/Alertness: Awake/alert Behavior During Therapy: WFL for tasks assessed/performed Overall Cognitive Status: Within Functional Limits for tasks assessed                                 General Comments: Pt is A and O x 4 and cooperative and pleasant throughout. Very motivated and great inpatient rehab candidate  Exercises      General Comments        Pertinent Vitals/Pain Pain Assessment: 0-10 Pain Score: 3  Pain Location: LBP Pain Descriptors / Indicators: Sore Pain Intervention(s): Limited activity within patient's tolerance;Monitored during session;Premedicated before session;Repositioned    Home Living                      Prior Function            PT Goals (current goals can now be found in the care plan section) Acute Rehab PT Goals Patient Stated Goal: to get to rehab and regain independence  Progress towards PT goals: Progressing toward goals     Frequency    7X/week      PT Plan Current plan remains appropriate    Co-evaluation              AM-PAC PT "6 Clicks" Mobility   Outcome Measure  Help needed turning from your back to your side while in a flat bed without using bedrails?: A Little Help needed moving from lying on your back to sitting on the side of a flat bed without using bedrails?: A Little Help needed moving to and from a bed to a chair (including a wheelchair)?: A Little Help needed standing up from a chair using your arms (e.g., wheelchair or bedside chair)?: A Little Help needed to walk in hospital room?: A Lot Help needed climbing 3-5 steps with a railing? : A Lot 6 Click Score: 16    End of Session Equipment Utilized During Treatment: Gait belt Activity Tolerance: Patient tolerated treatment well Patient left: with call bell/phone within reach;in bed;with bed alarm set Nurse Communication: Mobility status PT Visit Diagnosis: Difficulty in walking, not elsewhere classified (R26.2);Hemiplegia and hemiparesis Hemiplegia - Right/Left: Left Hemiplegia - dominant/non-dominant: Non-dominant Hemiplegia - caused by: Cerebral infarction     Time: 1007-1035 PT Time Calculation (min) (ACUTE ONLY): 28 min  Charges:  $Gait Training: 8-22 mins $Therapeutic Activity: 8-22 mins                     Julaine Fusi PTA 11/16/19, 12:26 PM

## 2019-11-16 NOTE — Progress Notes (Signed)
PROGRESS NOTE    Francis Sims.  [id number] DOB: 09/19/1940 DOA: 11/09/2019 PCP: Cassandria Anger, MD       Assessment & Plan:   Principal Problem:   Acute ischemic right MCA stroke [facility]) Active Problems:   PERIPHERAL VASCULAR DISEASE   Dyslipidemia   Situational mixed anxiety and depressive disorder   Accelerated hypertension   Stroke (cerebrum) (HCC)   Acute ischemic right MCA CVA: CTA head and neck showed severe stenosis at right P1-P2 junction, no other significant stenosis. Echo shows EF of 60-35% with no wall motion abnormality and LVH. Neurology recommends dual antiplatelet therapy with aspirin & plavix x 3 weeks followed by plavix monotherapy daily. Pt intolerant to statin. PT/OT recommends CIR given some residual left lower extremity weakness and severe gait impairment.  Awaiting insurance authorization still.  Acute hypoxia: overnight patient found to be hypoxic and complained of shortness of breath. Found to have oxygen desaturation to 78% requiring 6 L via nasal cannula and briefly NRB. Chest x-ray done was negative for any infiltrate or pulmonary edema. Symptoms resolved and patient maintaining sats on room air. Recent 2D echo with normal EF. IV fluids discontinued. Resolved  Uncontrolled HTN: continue on amlodipine   Anxiety and depressive disorder: severity unknown. Ativan prn.  BPH: continue flomax   DVT prophylaxis:  lovenox Code Status: full  Family Communication:  Disposition Plan: d/c to CIR but waiting on insurance auth   Status is: Inpatient  Remains inpatient appropriate because:Unsafe d/c plan   Dispo: The patient is from: Home              Anticipated d/c is to: CIR              Anticipated d/c date is: 1 day              Patient currently is medically stable to d/c. Waiting on insurance auth to get                 pt to CIR     Consultants:   neuro   Procedures:    Antimicrobials:    Subjective: Pt c/o back  pain  Objective: Vitals:   11/15/19 1600 11/15/19 2056 11/16/19 0000 11/16/19 0458  BP: (!) 156/93 (!) 152/96 (!) 148/67 (!) 128/59  Pulse: 65 73 70   Resp: 20  20   Temp: 98.3 F (36.8 C)  98.7 F (37.1 C)   TempSrc: Oral  Oral   SpO2: 95%  95%   Weight:      Height:        Intake/Output Summary (Last 24 hours) at 11/16/2019 0725 Last data filed at 11/16/2019 0457 Gross per 24 hour  Intake 240 ml  Output 800 ml  Net -560 ml   Filed Weights   11/09/19 0950  Weight: 88.5 kg    Examination:  General exam: Appears calm and comfortable  Respiratory system: Clear to auscultation. No wheezes, rales Cardiovascular system: S1 & S2 +. No rubs, gallops or clicks.  Gastrointestinal system: Abdomen is nondistended, soft and nontender. Hypoactive  bowel sounds heard. Central nervous system: Alert and oriented. Moves all 4 extremities  Psychiatry: Judgement and insight appear normal. Frustrated.     Data Reviewed: I have personally reviewed following labs and imaging studies  CBC: Recent Labs  Lab 11/09/19 0958 11/16/19 0450  WBC 8.6 9.3  NEUTROABS 5.9  --   HGB 16.3 15.3  HCT 47.8 45.4  MCV 83.1 84.2  PLT  231 [id number]   Basic Metabolic Panel: Recent Labs  Lab 11/09/19 0958 11/14/19 0724 11/16/19 0450  NA 139 133* 136  K 3.9 4.1 4.3  CL 104 99 99  CO2 26 24 28   GLUCOSE 144* 146* 138*  BUN 19 29* 33*  CREATININE 0.75 1.09 1.11  CALCIUM 8.9 9.0 9.2  MG 2.0  --   --    GFR: Estimated Creatinine Clearance: 65.8 mL/min (by C-G formula based on SCr of 1.11 mg/dL). Liver Function Tests: Recent Labs  Lab 11/09/19 0958  AST 26  ALT 24  ALKPHOS 85  BILITOT 1.6*  PROT 8.2*  ALBUMIN 4.6   No results for input(s): LIPASE, AMYLASE in the last 168 hours. No results for input(s): AMMONIA in the last 168 hours. Coagulation Profile: Recent Labs  Lab 11/09/19 0958  INR 1.0   Cardiac Enzymes: No results for input(s): CKTOTAL, CKMB, CKMBINDEX, TROPONINI in the last 168  hours. BNP (last 3 results) No results for input(s): PROBNP in the last 8760 hours. HbA1C: No results for input(s): HGBA1C in the last 72 hours. CBG: Recent Labs  Lab 11/09/19 1003 11/14/19 0802 11/14/19 1148  GLUCAP 145* 142* 203*   Lipid Profile: No results for input(s): CHOL, HDL, LDLCALC, TRIG, CHOLHDL, LDLDIRECT in the last 72 hours. Thyroid Function Tests: No results for input(s): TSH, T4TOTAL, FREET4, T3FREE, THYROIDAB in the last 72 hours. Anemia Panel: No results for input(s): VITAMINB12, FOLATE, FERRITIN, TIBC, IRON, RETICCTPCT in the last 72 hours. Sepsis Labs: No results for input(s): PROCALCITON, LATICACIDVEN in the last 168 hours.  Recent Results (from the past 240 hour(s))  SARS Coronavirus 2 by RT PCR (hospital order, performed in [facility] hospital lab) Nasopharyngeal Nasopharyngeal Swab     Status: None   Collection Time: 11/09/19  4:45 PM   Specimen: Nasopharyngeal Swab  Result Value Ref Range Status   SARS Coronavirus 2 NEGATIVE NEGATIVE Final    Comment: (NOTE) SARS-CoV-2 target nucleic acids are NOT DETECTED. The SARS-CoV-2 RNA is generally detectable in upper and lower respiratory specimens during the acute phase of infection. The lowest concentration of SARS-CoV-2 viral copies this assay can detect is 250 copies / mL. A negative result does not preclude SARS-CoV-2 infection and should not be used as the sole basis for treatment or other patient management decisions.  A negative result may occur with improper specimen collection / handling, submission of specimen other than nasopharyngeal swab, presence of viral mutation(s) within the areas targeted by this assay, and inadequate number of viral copies (<250 copies / mL). A negative result must be combined with clinical observations, patient history, and epidemiological information. Fact Sheet for Patients:   StrictlyIdeas.no Fact Sheet for Healthcare  Providers: BankingDealers.co.za This test is not yet approved or cleared  by the [place] FDA and has been authorized for detection and/or diagnosis of SARS-CoV-2 by FDA under an Emergency Use Authorization (EUA).  This EUA will remain in effect (meaning this test can be used) for the duration of the COVID-19 declaration under Section 564(b)(1) of the Act, 21 U.S.C. section 360bbb-3(b)(1), unless the authorization is terminated or revoked sooner. Performed at [facility], [address] [place], [place] [address]          Radiology Studies: No results found.      Scheduled Meds: .  stroke: mapping our early stages of recovery book   Does not apply Once  . amLODipine  5 mg Oral Daily  . vitamin C  250 mg  Oral Daily  . aspirin  81 mg Oral Daily  . cholecalciferol  1,000 Units Oral Daily  . clopidogrel  75 mg Oral Daily  . enoxaparin (LOVENOX) injection  40 mg Subcutaneous Q24H  . feeding supplement (ENSURE ENLIVE)  237 mL Oral BID BM  . hydrALAZINE  50 mg Oral Q6H  . loratadine  10 mg Oral Daily  . melatonin  5 mg Oral QHS  . metoprolol tartrate  25 mg Oral BID  . multivitamin with minerals  1 tablet Oral Daily  . olopatadine  1 drop Both Eyes BID  . omega-3 acid ethyl esters  1 g Oral Daily  . tamsulosin  0.4 mg Oral Daily   Continuous Infusions:   LOS: 7 days    Time spent: 30 mins     Wyvonnia Dusky, MD [facility] Pager [phone]  If 7PM-7AM, please contact night-coverage [email or web address] 11/16/2019, 7:25 AM

## 2019-11-17 LAB — BASIC METABOLIC PANEL
Anion gap: 7 (ref 5–15)
BUN: 27 mg/dL — ABNORMAL HIGH (ref 8–23)
CO2: 29 mmol/L (ref 22–32)
Calcium: 8.9 mg/dL (ref 8.9–10.3)
Chloride: 100 mmol/L (ref 98–111)
Creatinine, Ser: 1.05 mg/dL (ref 0.61–1.24)
GFR calc Af Amer: >60 mL/min (ref >60)
GFR calc non Af Amer: >60 mL/min (ref >60)
Glucose, Bld: 151 mg/dL — ABNORMAL HIGH (ref 70–99)
Potassium: 4.4 mmol/L (ref 3.5–5.1)
Sodium: 136 mmol/L (ref 135–145)

## 2019-11-17 LAB — CBC
HCT: 44.7 % (ref 39.0–52.0)
Hemoglobin: 15.3 g/dL (ref 13.0–17.0)
MCH: 28.3 pg (ref 26.0–34.0)
MCHC: 34.2 g/dL (ref 30.0–36.0)
MCV: 82.6 fL (ref 80.0–100.0)
Platelets: 262 10*3/uL (ref 150–400)
RBC: 5.41 MIL/uL (ref 4.22–5.81)
RDW: 13.7 % (ref 11.5–15.5)
WBC: 10.3 10*3/uL (ref 4.0–10.5)
nRBC: 0 % (ref 0.0–0.2)

## 2019-11-17 NOTE — Plan of Care (Signed)
  Problem: Education: Goal: Knowledge of disease or condition will improve Outcome: Progressing   Problem: Education: Goal: Knowledge of secondary prevention will improve Outcome: Progressing   Problem: Education: Goal: Knowledge of patient specific risk factors addressed and post discharge goals established will improve Outcome: Progressing

## 2019-11-17 NOTE — Progress Notes (Signed)
Offered to give patient Amlodipine and Metoprolol for his blood pressure. Patient and wife declined at this time as they say that both they and their doctor feel comfortable with his blood pressure in the 492'E systolic and he's not on blood pressure meds at home so they will hold these meds for now.

## 2019-11-17 NOTE — Progress Notes (Signed)
Physical Therapy Treatment Patient Details Name: Francis Sims. MRN: [id number] DOB: 07/22/1940 Today's Date: 11/17/2019    History of Present Illness presented to ER secondary to acute onset of L UE weakness/numbness; admitted for TIA/CVA work -up.  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    PT Comments    Pt was supine in bed with HOB elevated ~ 20 degrees. He states he is not feeling as well as he has in previous days. Resting BP 143/60. Pt requested to get OOB and urinate. Pt required min assist to achieve EOB sitting via rolling L to short sit. Increased time required and vcs throughout for sequencing, technique, and safety. Sat EOB x several minutes while pt used urinal to urinate. He stood to RW with min assist and moderate vcs for technique from elevated bed height. Static stood for 2 minute with pt c/o dizziness. He took steps to recliner and quickly was seated. BP 97/58 after standing activity. RN notified. Per RN request, she requested BP only be taken with dynamap and not continuous monitoring system. Pt was resting comfortable in recliner with BLEs elevated and  BP  Returning to 128/64 at conclusion of session. He was in recliner with chair alarm in place and call bell in reach. Therapist continues to recommend DC to CIR. Peer to peer for insurance approval in pending.      Follow Up Recommendations  CIR     Equipment Recommendations  Other (comment)(defer to next level of care)    Recommendations for Other Services       Precautions / Restrictions Precautions Precautions: Fall Precaution Comments: watch orthostatics Restrictions Weight Bearing Restrictions: No    Mobility  Bed Mobility Overal bed mobility: Needs Assistance Bed Mobility: Supine to Sit;Rolling;Sidelying to Sit Rolling: Supervision Sidelying to sit: Min assist Supine to sit: Min assist     General bed mobility comments: Pt was able to log roll to short sit with increased time + min  assist.   Transfers Overall transfer level: Needs assistance Equipment used: Rolling walker (2 wheeled) Transfers: Sit to/from Stand Sit to Stand: Min assist         General transfer comment: Min asisst to prevent posterior LOB with standing. vcs for improved fwd wt shift and proper handplacement. pt with increased overall weakness throughout session. Pt c/o dizziness after standing ~ 20 sec.  Ambulation/Gait Ambulation/Gait assistance: Min assist Gait Distance (Feet): 5 Feet Assistive device: Rolling walker (2 wheeled) Gait Pattern/deviations: Step-to pattern;Ataxic;Staggering left;Decreased stance time - left Gait velocity: decreased   General Gait Details: pt was or unsteady with ambulation to recliner this date. limited distances 2/2 to orthostatic hypotension. RN aware   Stairs             Wheelchair Mobility    Modified Rankin (Stroke Patients Only)       Balance Overall balance assessment: Needs assistance Sitting-balance support: Feet supported Sitting balance-Leahy Scale: Fair Sitting balance - Comments: pt required slightly more assistance this date even with sitting balance. L lateral lean present but pt able to correct with vcs and occasional CGA for tactle cueing   Standing balance support: Bilateral upper extremity supported Standing balance-Leahy Scale: Fair Standing balance comment: static standing balance is fair, dynamic was poor. pt has severe LLE weakness and relys more on UE support this date to maintain balance.  Cognition Arousal/Alertness: Awake/alert Behavior During Therapy: WFL for tasks assessed/performed Overall Cognitive Status: Within Functional Limits for tasks assessed                                 General Comments: Pt is A and O x 4 and cooperative and pleasant throughout. Very motivated and great inpatient rehab candidate      Exercises      General Comments         Pertinent Vitals/Pain Pain Assessment: No/denies pain Pain Score: 0-No pain Pain Intervention(s): Limited activity within patient's tolerance;Premedicated before session;Repositioned    Home Living                      Prior Function            PT Goals (current goals can now be found in the care plan section) Acute Rehab PT Goals Patient Stated Goal: to get to rehab and regain independence  Progress towards PT goals: Not progressing toward goals - comment(orthostatic hypotension limiting todays sessions)    Frequency    7X/week      PT Plan Current plan remains appropriate    Co-evaluation              AM-PAC PT "6 Clicks" Mobility   Outcome Measure  Help needed turning from your back to your side while in a flat bed without using bedrails?: None Help needed moving from lying on your back to sitting on the side of a flat bed without using bedrails?: A Little Help needed moving to and from a bed to a chair (including a wheelchair)?: A Lot Help needed standing up from a chair using your arms (e.g., wheelchair or bedside chair)?: A Lot Help needed to walk in hospital room?: A Lot Help needed climbing 3-5 steps with a railing? : A Lot 6 Click Score: 15    End of Session Equipment Utilized During Treatment: Gait belt Activity Tolerance: Patient limited by fatigue;Treatment limited secondary to medical complications (Comment)(limited by hypotension with standing activity) Patient left: in chair;with chair alarm set;with call bell/phone within reach Nurse Communication: Mobility status PT Visit Diagnosis: Difficulty in walking, not elsewhere classified (R26.2);Hemiplegia and hemiparesis Hemiplegia - Right/Left: Left Hemiplegia - dominant/non-dominant: Non-dominant Hemiplegia - caused by: Cerebral infarction     Time: 0865-7846 PT Time Calculation (min) (ACUTE ONLY): 27 min  Charges:  $Therapeutic Activity: 8-22 mins $Neuromuscular Re-education: 8-22  mins                     Julaine Fusi PTA 11/17/19, 11:49 AM
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
## 2019-11-17 NOTE — Progress Notes (Signed)
Inpatient Rehabilitation Admissions Coordinator  I have received a denial form [employer] for CIR admit after Peer to peer. I spoke with pt's wife via phone at pt's bedside. They do not want to pursue appeal. They would like to pursue SNF rehab at this time. I have notified acute team to include Pryor Montes, RN CM with TOC team. We will sign off at this time.   Danne Baxter, RN, MSN Rehab Admissions Coordinator [phone] 11/17/2019 1:49 PM

## 2019-11-17 NOTE — Progress Notes (Signed)
Physical Therapy Treatment Patient Details Name: Francis Sims. MRN: [id number] DOB: 1941-02-25 Today's Date: 11/17/2019    History of Present Illness presented to ER secondary to acute onset of L UE weakness/numbness; admitted for TIA/CVA work -up.  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    PT Comments    Pt was seated in recliner after earlier PT session. He is requesting to return to bed 2/2 to not feeling very well. BP in sitting 148/64. After standing and take steps to recliner BP dropped to 97/58. He reports dizziness. Mod assist to return to supine. Pt overall had increased difficulty with PT sessions today. He is great CIR candidate. RN aware of BP concerns.      11/17/19 1100  Vital Signs  Pulse Rate 82  BP (!) 148/64  BP Location Right Arm  BP Method Automatic  Patient Position (if appropriate) Sitting  Orthostatic Sitting  BP- Sitting 148/64  Pulse- Sitting 82  Orthostatic Standing at 3 minutes  BP- Standing at 3 minutes 97/58  Pulse- Standing at 3 minutes 92  Oxygen Therapy  SpO2 94 %  O2 Device Room Air    Follow Up Recommendations  CIR     Equipment Recommendations  Other (comment)(defer to next level of care)    Recommendations for Other Services       Precautions / Restrictions Precautions Precautions: Fall Precaution Comments: watch orthostatics Restrictions Weight Bearing Restrictions: No    Mobility  Bed Mobility Overal bed mobility: Needs Assistance Bed Mobility: Sit to Supine;Sit to Sidelying Rolling: Supervision Sidelying to sit: Min assist Supine to sit: Min assist Sit to supine: Min assist;Mod assist Sit to sidelying: Min assist General bed mobility comments: Pt was able to sit to supine with increased time and mod assist. he was able to achieve side lying with min assist but required mod assist to progress BLEs into bed. incraesed assistance today 2/2 to pt with increased weakness noted + dizziness from  hypotension  Transfers Overall transfer level: Needs assistance Equipment used: Rolling walker (2 wheeled) Transfers: Sit to/from Stand Sit to Stand: Min assist         General transfer comment: Pt requesting to return to bed 2/2 to fatigue. BP in sitting 148/64. he stood to [employer] with min assist.  Ambulation/Gait Ambulation/Gait assistance: Min Web designer (Feet): 5 Feet Assistive device: Rolling walker (2 wheeled) Gait Pattern/deviations: Step-to pattern;Ataxic;Staggering left;Decreased stance time - left Gait velocity: decreased   General Gait Details: pt c/o dizziness in standing and was quickly able to return to EOb from recliner with min assist for safety.    Stairs             Wheelchair Mobility    Modified Rankin (Stroke Patients Only)       Balance Overall balance assessment: Needs assistance Sitting-balance support: Feet supported Sitting balance-Leahy Scale: Fair Sitting balance - Comments: pt required slightly more assistance this date even with sitting balance. L lateral lean present but pt able to correct with vcs and occasional CGA for tactle cueing   Standing balance support: Bilateral upper extremity supported Standing balance-Leahy Scale: Fair Standing balance comment: static standing balance is fair, dynamic was poor. pt has severe LLE weakness and relys more on UE support this date to maintain balance.                            Cognition Arousal/Alertness: Awake/alert Behavior During Therapy: WFL for  tasks assessed/performed Overall Cognitive Status: Within Functional Limits for tasks assessed                                 General Comments: Pt is A and O and motivated to participate in PT      Exercises      General Comments        Pertinent Vitals/Pain Pain Assessment: No/denies pain Pain Score: 0-No pain Pain Intervention(s): Other (comment)(reports fatigue this date)    Home Living                       Prior Function            PT Goals (current goals can now be found in the care plan section) Acute Rehab PT Goals Patient Stated Goal: to get to rehab and regain independence  Progress towards PT goals: Progressing toward goals(limited today by orthostatic hypotension)    Frequency    7X/week      PT Plan Current plan remains appropriate    Co-evaluation              AM-PAC PT "6 Clicks" Mobility   Outcome Measure  Help needed turning from your back to your side while in a flat bed without using bedrails?: None Help needed moving from lying on your back to sitting on the side of a flat bed without using bedrails?: A Little Help needed moving to and from a bed to a chair (including a wheelchair)?: A Lot Help needed standing up from a chair using your arms (e.g., wheelchair or bedside chair)?: A Lot Help needed to walk in hospital room?: A Lot Help needed climbing 3-5 steps with a railing? : A Lot 6 Click Score: 15    End of Session Equipment Utilized During Treatment: Gait belt Activity Tolerance: Patient limited by fatigue;Treatment limited secondary to medical complications (Comment) Patient left: in bed;with call bell/phone within reach;with bed alarm set Nurse Communication: Mobility status PT Visit Diagnosis: Difficulty in walking, not elsewhere classified (R26.2);Hemiplegia and hemiparesis Hemiplegia - Right/Left: Left Hemiplegia - dominant/non-dominant: Non-dominant Hemiplegia - caused by: Cerebral infarction     Time: 3704-8889 PT Time Calculation (min) (ACUTE ONLY): 14 min  Charges:  $Therapeutic Activity: 8-22 mins $Neuromuscular Re-education: 8-22 mins                     Julaine Fusi PTA 11/17/19, 12:10 PM

## 2019-11-17 NOTE — Progress Notes (Signed)
PROGRESS NOTE    Francis Sims.  SMO:[id number] DOB: Oct 25, 1940 DOA: 11/09/2019 PCP: Cassandria Anger, MD       Assessment & Plan:   Principal Problem:   Acute ischemic right MCA stroke [facility]) Active Problems:   PERIPHERAL VASCULAR DISEASE   Dyslipidemia   Situational mixed anxiety and depressive disorder   Accelerated hypertension   Stroke (cerebrum) (HCC)   Acute ischemic right MCA CVA: CTA head and neck showed severe stenosis at right P1-P2 junction, no other significant stenosis. Echo shows EF of 60-35% with no wall motion abnormality and LVH. Neurology recommends dual antiplatelet therapy with aspirin & plavix x 3 weeks followed by plavix monotherapy daily. Pt intolerant to statin. PT/OT recommends CIR given some residual left lower extremity weakness and severe gait impairment. Pt's insurance denied CIR but is ok w/ SNF.  Acute hypoxia: overnight patient found to be hypoxic and complained of shortness of breath. Found to have oxygen desaturation to 78% requiring 6 L via nasal cannula and briefly NRB. Chest x-ray done was negative for any infiltrate or pulmonary edema. Symptoms resolved and patient maintaining sats on room air. Recent 2D echo with normal EF. IV fluids discontinued. Resolved  Uncontrolled HTN: hold morning doses of amlodipine, metoprolol secondary to orthostatic hypotension   Anxiety and depressive disorder: severity unknown. Ativan prn.  BPH: continue flomax   DVT prophylaxis:  lovenox Code Status: full  Family Communication: discussed pt's care w/ pt's wife and answered her questions  Disposition Plan: d/c to CIR but insurance denied CIR but is ok w/ SNF. Likely d/c to SNF. CM is working on this   Status is: Inpatient  Remains inpatient appropriate because:Unsafe d/c plan   Dispo: The patient is from: Home              Anticipated d/c is to: CIR vs SNF              Anticipated d/c date is: 1-2 days               Patient currently is  medically stable to d/c.  Insurance denied CIR but is ok w/              SNF. Insurance still denied CIR after peer to peer review. Pt and pt's wife                     want to proceed w/ SNF    Consultants:   neuro   Procedures:    Antimicrobials:    Subjective: Pt c/o frustration of not getting the therapy he needs.  Objective: Vitals:   11/16/19 2350 11/17/19 0000 11/17/19 0400 11/17/19 0712  BP: 121/61 (!) 144/51 (!) 120/56 (!) 158/68  Pulse: 65 63 62 64  Resp: 18 18    Temp: 98.9 F (37.2 C)  98.5 F (36.9 C) 98.1 F (36.7 C)  TempSrc: Oral  Oral Oral  SpO2: 94%  95% 96%  Weight:      Height:        Intake/Output Summary (Last 24 hours) at 11/17/2019 0735 Last data filed at 11/17/2019 0510 Gross per 24 hour  Intake 360 ml  Output 950 ml  Net -590 ml   Filed Weights   11/09/19 0950  Weight: 88.5 kg    Examination:  General exam: Appears calm and comfortable  Respiratory system: Clear to auscultation. No rhonchi  Cardiovascular system: S1 & S2 +. No rubs, gallops or clicks.  Gastrointestinal  system: Abdomen is nondistended, soft and nontender.Normal  bowel sounds heard. Central nervous system: Alert and oriented. Moves all 4 extremities  Psychiatry: Judgement and insight appear normal. Frustrated.     Data Reviewed: I have personally reviewed following labs and imaging studies  CBC: Recent Labs  Lab 11/16/19 0450 11/17/19 0525  WBC 9.3 10.3  HGB 15.3 15.3  HCT 45.4 44.7  MCV 84.2 82.6  PLT 252 947   Basic Metabolic Panel: Recent Labs  Lab 11/14/19 0724 11/16/19 0450 11/17/19 0525  NA 133* 136 136  K 4.1 4.3 4.4  CL 99 99 100  CO2 24 28 29   GLUCOSE 146* 138* 151*  BUN 29* 33* 27*  CREATININE 1.09 1.11 1.05  CALCIUM 9.0 9.2 8.9   GFR: Estimated Creatinine Clearance: 69.6 mL/min (by C-G formula based on SCr of 1.05 mg/dL). Liver Function Tests: No results for input(s): AST, ALT, ALKPHOS, BILITOT, PROT, ALBUMIN in the last 168  hours. No results for input(s): LIPASE, AMYLASE in the last 168 hours. No results for input(s): AMMONIA in the last 168 hours. Coagulation Profile: No results for input(s): INR, PROTIME in the last 168 hours. Cardiac Enzymes: No results for input(s): CKTOTAL, CKMB, CKMBINDEX, TROPONINI in the last 168 hours. BNP (last 3 results) No results for input(s): PROBNP in the last 8760 hours. HbA1C: No results for input(s): HGBA1C in the last 72 hours. CBG: Recent Labs  Lab 11/14/19 0802 11/14/19 1148  GLUCAP 142* 203*   Lipid Profile: No results for input(s): CHOL, HDL, LDLCALC, TRIG, CHOLHDL, LDLDIRECT in the last 72 hours. Thyroid Function Tests: No results for input(s): TSH, T4TOTAL, FREET4, T3FREE, THYROIDAB in the last 72 hours. Anemia Panel: No results for input(s): VITAMINB12, FOLATE, FERRITIN, TIBC, IRON, RETICCTPCT in the last 72 hours. Sepsis Labs: No results for input(s): PROCALCITON, LATICACIDVEN in the last 168 hours.  Recent Results (from the past 240 hour(s))  SARS Coronavirus 2 by RT PCR (hospital order, performed in [facility] hospital lab) Nasopharyngeal Nasopharyngeal Swab     Status: None   Collection Time: 11/09/19  4:45 PM   Specimen: Nasopharyngeal Swab  Result Value Ref Range Status   SARS Coronavirus 2 NEGATIVE NEGATIVE Final    Comment: (NOTE) SARS-CoV-2 target nucleic acids are NOT DETECTED. The SARS-CoV-2 RNA is generally detectable in upper and lower respiratory specimens during the acute phase of infection. The lowest concentration of SARS-CoV-2 viral copies this assay can detect is 250 copies / mL. A negative result does not preclude SARS-CoV-2 infection and should not be used as the sole basis for treatment or other patient management decisions.  A negative result may occur with improper specimen collection / handling, submission of specimen other than nasopharyngeal swab, presence of viral mutation(s) within the areas targeted by this assay, and  inadequate number of viral copies (<250 copies / mL). A negative result must be combined with clinical observations, patient history, and epidemiological information. Fact Sheet for Patients:   StrictlyIdeas.no Fact Sheet for Healthcare Providers: BankingDealers.co.za This test is not yet approved or cleared  by the [place] FDA and has been authorized for detection and/or diagnosis of SARS-CoV-2 by FDA under an Emergency Use Authorization (EUA).  This EUA will remain in effect (meaning this test can be used) for the duration of the COVID-19 declaration under Section 564(b)(1) of the Act, 21 U.S.C. section 360bbb-3(b)(1), unless the authorization is terminated or revoked sooner. Performed at [facility], [address] [place], [place] [address]  Radiology Studies: No results found.      Scheduled Meds: .  stroke: mapping our early stages of recovery book   Does not apply Once  . amLODipine  5 mg Oral Daily  . vitamin C  250 mg Oral Daily  . aspirin  81 mg Oral Daily  . cholecalciferol  1,000 Units Oral Daily  . clopidogrel  75 mg Oral Daily  . enoxaparin (LOVENOX) injection  40 mg Subcutaneous Q24H  . feeding supplement (ENSURE ENLIVE)  237 mL Oral TID BM  . hydrALAZINE  50 mg Oral Q6H  . loratadine  10 mg Oral Daily  . melatonin  5 mg Oral QHS  . metoprolol tartrate  25 mg Oral BID  . multivitamin with minerals  1 tablet Oral Daily  . olopatadine  1 drop Both Eyes BID  . omega-3 acid ethyl esters  1 g Oral Daily  . tamsulosin  0.4 mg Oral Daily   Continuous Infusions:   LOS: 8 days    Time spent: 33 mins     Wyvonnia Dusky, MD [facility] Pager [phone]  If 7PM-7AM, please contact night-coverage [email or web address] 11/17/2019, 7:35 AM

## 2019-11-17 NOTE — TOC Progression Note (Signed)
Transition of Care (TOC) - Progression Note    Patient Details  Name: Francis Sims. MRN: [id number] Date of Birth: 16-Oct-1940  Transition of [facility]) CM/SW Contact  Shelbie Hutching, RN Phone Number: 11/17/2019, 3:30 PM  Clinical Narrative:    [facility] denied authorization for CIR.  Patient and family decided not to appeal and would like the next best thing which is skilled nursing rehab.  First choice was [facility], unfortunately [facility] is not in-network.  [facility] the next choice, they are in-network, just waiting to see if they can offer a bed.      Expected Discharge Plan: [place] Barriers to Discharge: Insurance Authorization(and waiting on bed offer)  Expected Discharge Plan and Services Expected Discharge Plan: [place]   Discharge Planning Services: CM Consult Post Acute Care Choice: [place] Living arrangements for the past 2 months: Single Family Home                                       Social Determinants of Health (SDOH) Interventions    Readmission Risk Interventions No flowsheet data found.

## 2019-11-18 LAB — BASIC METABOLIC PANEL
Anion gap: 10 (ref 5–15)
BUN: 31 mg/dL — ABNORMAL HIGH (ref 8–23)
CO2: 28 mmol/L (ref 22–32)
Calcium: 9 mg/dL (ref 8.9–10.3)
Chloride: 98 mmol/L (ref 98–111)
Creatinine, Ser: 0.94 mg/dL (ref 0.61–1.24)
GFR calc Af Amer: >60 mL/min (ref >60)
GFR calc non Af Amer: >60 mL/min (ref >60)
Glucose, Bld: 180 mg/dL — ABNORMAL HIGH (ref 70–99)
Potassium: 4 mmol/L (ref 3.5–5.1)
Sodium: 136 mmol/L (ref 135–145)

## 2019-11-18 LAB — CBC
HCT: 45.3 % (ref 39.0–52.0)
Hemoglobin: 15.4 g/dL (ref 13.0–17.0)
MCH: 28.3 pg (ref 26.0–34.0)
MCHC: 34 g/dL (ref 30.0–36.0)
MCV: 83.3 fL (ref 80.0–100.0)
Platelets: 266 10*3/uL (ref 150–400)
RBC: 5.44 MIL/uL (ref 4.22–5.81)
RDW: 13.6 % (ref 11.5–15.5)
WBC: 9 10*3/uL (ref 4.0–10.5)
nRBC: 0 % (ref 0.0–0.2)

## 2019-11-18 NOTE — TOC Progression Note (Signed)
Transition of Care (TOC) - Progression Note    Patient Details  Name: Francis Sims. MRN: [id number] Date of Birth: 10/12/1940  [facility]) CM/SW Contact  Boris Sharper, LCSW Phone Number: 11/18/2019, 3:30 PM  Clinical Narrative:    Tammy with [facility] notified CSW that authorization had been initiated and is pending.   Expected Discharge Plan: [place] Barriers to Discharge: Insurance Authorization(and waiting on bed offer)  Expected Discharge Plan and Services Expected Discharge Plan: [place]   Discharge Planning Services: CM Consult Post Acute Care Choice: [place] Living arrangements for the past 2 months: Single Family Home                                       Social Determinants of Health (SDOH) Interventions    Readmission Risk Interventions No flowsheet data found.

## 2019-11-18 NOTE — Plan of Care (Signed)

## 2019-11-18 NOTE — Progress Notes (Signed)
Physical Therapy Treatment Patient Details Name: Francis Sims. MRN: [id number] DOB: [birth year] Today's Date: 11/18/2019    History of Present Illness presented to ER secondary to acute onset of L UE weakness/numbness; admitted for TIA/CVA work -up.  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    PT Comments    Pt was supine in bed with HOB elevated ~ 30 degrees upon arriving. He agrees to PT session and is anxious to get OOB and ambulate." I need to get to moving." He was able to exit L side of bed with increased time and vcs for proper technique. Rolled L to short sit. Sat EOB with supervision this date without LOB. Required min assist to stand to RW and ambulate 160 ft. Constant min assist during gait to assist with R lateral wt shift to allow LLE advancement. He tolerated gait well but was fatigued by the end of gait trial. Once returned to room, therapist reviewed LUE/LLE exercises to promote improved coordination and strength. He states understanding and was able to perform without assistance. He is seated in recliner with call bell in reach and RN in room. Pt will need continued skilled PT to address strength, coordination, balance, and safe functional mobility deificts.     Follow Up Recommendations  CIR;SNF     Equipment Recommendations  Other (comment)(defer to next level of care)    Recommendations for Other Services       Precautions / Restrictions Precautions Precautions: Fall Precaution Comments: watch orthostatics Restrictions Weight Bearing Restrictions: No    Mobility  Bed Mobility Overal bed mobility: Needs Assistance Bed Mobility: Sit to Supine;Sit to Sidelying     Supine to sit: Min assist Sit to supine: Min assist;Mod assist Sit to sidelying: Min assist General bed mobility comments: Pt was able to roll L to short sit with increased time and vcs for proper sequencing.  Transfers Overall transfer level: Needs assistance Equipment used: Rolling  walker (2 wheeled) Transfers: Sit to/from Stand Sit to Stand: Min assist         General transfer comment: MIn assist from slightly elevated bed height. Vcs for improved fwd wt shift. less knee instability noted.  Ambulation/Gait Ambulation/Gait assistance: Min assist Gait Distance (Feet): 160 Feet Assistive device: Rolling walker (2 wheeled) Gait Pattern/deviations: Step-to pattern;Ataxic;Staggering left;Decreased stance time - left Gait velocity: decreased   General Gait Details: pt required min assist for safety with assisting with R lateral wt shift to allow opposite LE advancement. several standing rest breaks but overall pt tolerated well. continues to be high fall risk.   Stairs             Wheelchair Mobility    Modified Rankin (Stroke Patients Only)       Balance Overall balance assessment: Needs assistance Sitting-balance support: Feet supported Sitting balance-Leahy Scale: Good Sitting balance - Comments: Less assistance required with maintaingin sitting balance EOB this date. supervision only   Standing balance support: Bilateral upper extremity supported Standing balance-Leahy Scale: Fair Standing balance comment: pt still has slight knee buckling present                            Cognition Arousal/Alertness: Awake/alert Behavior During Therapy: WFL for tasks assessed/performed Overall Cognitive Status: Within Functional Limits for tasks assessed  General Comments: Pt A and O x 4       Exercises      General Comments        Pertinent Vitals/Pain Pain Assessment: No/denies pain Pain Score: 0-No pain Pain Intervention(s): Limited activity within patient's tolerance;Monitored during session;Premedicated before session;Repositioned    Home Living                      Prior Function            PT Goals (current goals can now be found in the care plan section) Acute Rehab  PT Goals Patient Stated Goal: to get to rehab and regain independence  Progress towards PT goals: Progressing toward goals    Frequency    7X/week      PT Plan Current plan remains appropriate    Co-evaluation              AM-PAC PT "6 Clicks" Mobility   Outcome Measure  Help needed turning from your back to your side while in a flat bed without using bedrails?: None Help needed moving from lying on your back to sitting on the side of a flat bed without using bedrails?: A Little Help needed moving to and from a bed to a chair (including a wheelchair)?: A Lot Help needed standing up from a chair using your arms (e.g., wheelchair or bedside chair)?: A Lot Help needed to walk in hospital room?: A Lot Help needed climbing 3-5 steps with a railing? : A Lot 6 Click Score: 15    End of Session Equipment Utilized During Treatment: Gait belt Activity Tolerance: Patient tolerated treatment well Patient left: in bed;with call bell/phone within reach;with bed alarm set Nurse Communication: Mobility status PT Visit Diagnosis: Difficulty in walking, not elsewhere classified (R26.2);Hemiplegia and hemiparesis Hemiplegia - Right/Left: Left Hemiplegia - dominant/non-dominant: Non-dominant Hemiplegia - caused by: Cerebral infarction     Time: 1000-1047 PT Time Calculation (min) (ACUTE ONLY): 47 min  Charges:  $Gait Training: 23-37 mins $Neuromuscular Re-education: 8-22 mins                     Julaine Fusi PTA 11/18/19, 11:08 AM

## 2019-11-18 NOTE — Evaluation (Signed)
Occupational Therapy Evaluation Patient Details Name: Francis Sims. MRN: [id number] DOB: 10-13-1940 Today's Date: 11/18/2019    History of Present Illness presented to ER secondary to acute onset of L UE weakness/numbness; admitted for TIA/CVA work -up.  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   Clinical Impression   Francis Sims was seen for OT treatment on this date. Upon arrival to room pt awake reclined in bed and agreeable to therapy. Pt engaged in [place]: digit adduction/abduction, fist pumps, overhead press, weighted curls and educated on importance of HEP - pt able to self-identify and return demonstrate x3 LUE exercises to incorporate into HEP. Pt instructed in safe t/f technique, RW technique, neuromuscular re-ed, falls prevention, and desensitization techniques. Pt currently requires CGA + VCs for rest breaks during face washing standing sinkside. CGA to don/doff B scoks seated EOB c posterior LOB noted when donning L sock - pt required MIN A to correct. Pt making good progress toward goals. Pt continues to benefit from skilled OT services to maximize return to PLOF and minimize risk of future falls, injury, caregiver burden, and readmission. Will continue to follow POC. Discharge recommendation remains appropriate.      Follow Up Recommendations  CIR    Equipment Recommendations  None recommended by OT    Recommendations for Other Services       Precautions / Restrictions Precautions Precautions: Fall Precaution Comments: watch orthostatics Restrictions Weight Bearing Restrictions: No      Mobility Bed Mobility Overal bed mobility: Needs Assistance Bed Mobility: Supine to Sit;Sit to Supine;Rolling Rolling: Supervision   Supine to sit: Min assist Sit to supine: Min assist      Transfers Overall transfer level: Needs assistance Equipment used: Rolling walker (2 wheeled) Transfers: Sit to/from Stand Sit to Stand: Min assist               Balance Overall balance assessment: Needs assistance Sitting-balance support: Feet supported Sitting balance-Leahy Scale: Good Sitting balance - Comments: Posterior LOB noted c functional activity/raching outside BOS    Standing balance support: Single extremity supported;During functional activity Standing balance-Leahy Scale: Fair Standing balance comment: Pt stands c stomach against sink for balance                           ADL either performed or assessed with clinical judgement   ADL Overall ADL's : Needs assistance/impaired                                       General ADL Comments: CGA + VCs for rest breaks face washing standing sinkside. CGA  don/doff B scoks seated EOB c posterior LOB noted c LLE - pt required assist to correct     Vision         Perception     Praxis      Pertinent Vitals/Pain Pain Assessment: No/denies pain     Hand Dominance     Extremity/Trunk Assessment             Communication     Cognition Arousal/Alertness: Awake/alert Behavior During Therapy: WFL for tasks assessed/performed Overall Cognitive Status: Within Functional Limits for tasks assessed                                 General Comments: Pt  A and O x 4    General Comments       Exercises Exercises: Other exercises Other Exercises Other Exercises: Pt educated re: d/c recs, HEP, falls prevention, safe t/f technique, neuro re-ed, desensitization  Other Exercises: THEREX LUE: 1 set x 10 reps each - digit adduction/abduction, fist pumps, overhead press, weighted curls  Other Exercises: Bed mobility, sup<>sit, sit<>stand, sitting/standing balance/tolerance, face washing,    Shoulder Instructions      Home Living                                          Prior Functioning/Environment                   OT Problem List:        OT Treatment/Interventions:      OT Goals(Current goals can be found in  the care plan section) Acute Rehab OT Goals Patient Stated Goal: to get to rehab and regain independence  OT Goal Formulation: With patient Time For Goal Achievement: 11/24/19 Potential to Achieve Goals: Good ADL Goals Pt Will Perform Lower Body Dressing: with modified independence;sit to/from stand Pt Will Transfer to Toilet: ambulating;with min assist;regular height toilet(elevated toilet) Additional ADL Goal #1: Pt will complete UB/LB bathing while seated with supervision  OT Frequency: Min 3X/week   Barriers to D/C:            Co-evaluation              AM-PAC OT "6 Clicks" Daily Activity     Outcome Measure Help from another person eating meals?: None Help from another person taking care of personal grooming?: A Little Help from another person toileting, which includes using toliet, bedpan, or urinal?: A Lot Help from another person bathing (including washing, rinsing, drying)?: A Little Help from another person to put on and taking off regular upper body clothing?: A Little Help from another person to put on and taking off regular lower body clothing?: A Little 6 Click Score: 18   End of Session Equipment Utilized During Treatment: Gait belt;Rolling walker  Activity Tolerance: Patient tolerated treatment well Patient left: in bed;with call bell/phone within reach  OT Visit Diagnosis: Hemiplegia and hemiparesis;Repeated falls (R29.6);Ataxia, unspecified (R27.0) Hemiplegia - Right/Left: Left Hemiplegia - dominant/non-dominant: Non-Dominant Hemiplegia - caused by: Cerebral infarction                Time: 1500-1525 OT Time Calculation (min): 25 min Charges:  OT General Charges $OT Visit: 1 Visit OT Treatments $Self Care/Home Management : 8-22 mins $Therapeutic Exercise: 8-22 mins  Dessie Coma, M.S. OTR/L  11/18/19, 3:45 PM

## 2019-11-18 NOTE — Progress Notes (Signed)
PROGRESS NOTE    Francis Sims.  CHE:[id number] DOB: 1941/01/29 DOA: 11/09/2019 PCP: Cassandria Anger, MD       Assessment & Plan:   Principal Problem:   Acute ischemic right MCA stroke [facility]) Active Problems:   PERIPHERAL VASCULAR DISEASE   Dyslipidemia   Situational mixed anxiety and depressive disorder   Accelerated hypertension   Stroke (cerebrum) (HCC)   Acute ischemic right MCA CVA: CTA head and neck showed severe stenosis at right P1-P2 junction, no other significant stenosis. Echo shows EF of 60-35% with no wall motion abnormality and LVH. Neurology recommends dual antiplatelet therapy with aspirin & plavix x 3 weeks followed by plavix monotherapy daily. Pt intolerant to statin. PT/OT recommends CIR. Pt's insurance denied CIR but is ok w/ SNF.  Acute hypoxia: resolved  Uncontrolled HTN: continue on home dose of amlodipine, metoprolol  Anxiety and depressive disorder: severity unknown. Ativan prn.  BPH: continue flomax   DVT prophylaxis:  lovenox Code Status: full  Family Communication: Disposition Plan: d/c to CIR but insurance denied CIR but is ok w/ SNF. Likely d/c to SNF. CM is still working on this  Status is: Inpatient  Remains inpatient appropriate because:Unsafe d/c plan   Dispo: The patient is from: Home              Anticipated d/c is to: CIR vs SNF              Anticipated d/c date is: 1-2 days               Patient currently is medically stable to d/c.  Insurance denied CIR but is ok w/              SNF. Insurance still denied CIR after peer to peer review. Awaiting for SNF              placement    Consultants:   neuro   Procedures:    Antimicrobials:    Subjective: Pt c/o anxiety   Objective: Vitals:   11/17/19 2153 11/17/19 2339 11/18/19 0538 11/18/19 0743  BP: (!) 158/65 115/67 130/62 134/72  Pulse: 79 62 65 69  Resp: (!) 23 16 20    Temp: 98.2 F (36.8 C) 97.6 F (36.4 C) 97.8 F (36.6 C) 98.8 F (37.1 C)    TempSrc: Oral Oral Oral Oral  SpO2:  94% 94% 95%  Weight:      Height:        Intake/Output Summary (Last 24 hours) at 11/18/2019 0758 Last data filed at 11/18/2019 0500 Gross per 24 hour  Intake 240 ml  Output 650 ml  Net -410 ml   Filed Weights   11/09/19 0950  Weight: 88.5 kg    Examination:  General exam: Appears calm and comfortable  Respiratory system: Clear to auscultation. No rales, rhonchi or wheezes Cardiovascular system: S1 & S2 +. No rubs, gallops or clicks.  Gastrointestinal system: Abdomen is nondistended, soft and nontender. Hypoactive bowel sounds heard. Central nervous system: Alert and oriented. Moves all 4 extremities  Psychiatry: Judgement and insight appear normal. Frustrated.     Data Reviewed: I have personally reviewed following labs and imaging studies  CBC: Recent Labs  Lab 11/16/19 0450 11/17/19 0525  WBC 9.3 10.3  HGB 15.3 15.3  HCT 45.4 44.7  MCV 84.2 82.6  PLT 252 536   Basic Metabolic Panel: Recent Labs  Lab 11/14/19 0724 11/16/19 0450 11/17/19 0525  NA 133* 136 136  K  4.1 4.3 4.4  CL 99 99 100  CO2 24 28 29   GLUCOSE 146* 138* 151*  BUN 29* 33* 27*  CREATININE 1.09 1.11 1.05  CALCIUM 9.0 9.2 8.9   GFR: Estimated Creatinine Clearance: 69.6 mL/min (by C-G formula based on SCr of 1.05 mg/dL). Liver Function Tests: No results for input(s): AST, ALT, ALKPHOS, BILITOT, PROT, ALBUMIN in the last 168 hours. No results for input(s): LIPASE, AMYLASE in the last 168 hours. No results for input(s): AMMONIA in the last 168 hours. Coagulation Profile: No results for input(s): INR, PROTIME in the last 168 hours. Cardiac Enzymes: No results for input(s): CKTOTAL, CKMB, CKMBINDEX, TROPONINI in the last 168 hours. BNP (last 3 results) No results for input(s): PROBNP in the last 8760 hours. HbA1C: No results for input(s): HGBA1C in the last 72 hours. CBG: Recent Labs  Lab 11/14/19 0802 11/14/19 1148  GLUCAP 142* 203*   Lipid  Profile: No results for input(s): CHOL, HDL, LDLCALC, TRIG, CHOLHDL, LDLDIRECT in the last 72 hours. Thyroid Function Tests: No results for input(s): TSH, T4TOTAL, FREET4, T3FREE, THYROIDAB in the last 72 hours. Anemia Panel: No results for input(s): VITAMINB12, FOLATE, FERRITIN, TIBC, IRON, RETICCTPCT in the last 72 hours. Sepsis Labs: No results for input(s): PROCALCITON, LATICACIDVEN in the last 168 hours.  Recent Results (from the past 240 hour(s))  SARS Coronavirus 2 by RT PCR (hospital order, performed in [facility] hospital lab) Nasopharyngeal Nasopharyngeal Swab     Status: None   Collection Time: 11/09/19  4:45 PM   Specimen: Nasopharyngeal Swab  Result Value Ref Range Status   SARS Coronavirus 2 NEGATIVE NEGATIVE Final    Comment: (NOTE) SARS-CoV-2 target nucleic acids are NOT DETECTED. The SARS-CoV-2 RNA is generally detectable in upper and lower respiratory specimens during the acute phase of infection. The lowest concentration of SARS-CoV-2 viral copies this assay can detect is 250 copies / mL. A negative result does not preclude SARS-CoV-2 infection and should not be used as the sole basis for treatment or other patient management decisions.  A negative result may occur with improper specimen collection / handling, submission of specimen other than nasopharyngeal swab, presence of viral mutation(s) within the areas targeted by this assay, and inadequate number of viral copies (<250 copies / mL). A negative result must be combined with clinical observations, patient history, and epidemiological information. Fact Sheet for Patients:   StrictlyIdeas.no Fact Sheet for Healthcare Providers: BankingDealers.co.za This test is not yet approved or cleared  by the [place] FDA and has been authorized for detection and/or diagnosis of SARS-CoV-2 by FDA under an Emergency Use Authorization (EUA).  This EUA will remain in effect  (meaning this test can be used) for the duration of the COVID-19 declaration under Section 564(b)(1) of the Act, 21 U.S.C. section 360bbb-3(b)(1), unless the authorization is terminated or revoked sooner. Performed at [facility] [address], [place], [place] [address]          Radiology Studies: No results found.      Scheduled Meds: .  stroke: mapping our early stages of recovery book   Does not apply Once  . amLODipine  5 mg Oral Daily  . vitamin C  250 mg Oral Daily  . aspirin  81 mg Oral Daily  . cholecalciferol  1,000 Units Oral Daily  . clopidogrel  75 mg Oral Daily  . enoxaparin (LOVENOX) injection  40 mg Subcutaneous Q24H  . feeding supplement (ENSURE ENLIVE)  237 mL Oral TID  BM  . hydrALAZINE  50 mg Oral Q6H  . loratadine  10 mg Oral Daily  . melatonin  5 mg Oral QHS  . metoprolol tartrate  25 mg Oral BID  . multivitamin with minerals  1 tablet Oral Daily  . olopatadine  1 drop Both Eyes BID  . omega-3 acid ethyl esters  1 g Oral Daily  . tamsulosin  0.4 mg Oral Daily   Continuous Infusions:   LOS: 9 days    Time spent: 31 mins     Wyvonnia Dusky, MD [facility] Pager [phone]  If 7PM-7AM, please contact night-coverage [email or web address] 11/18/2019, 7:58 AM

## 2019-11-19 LAB — BASIC METABOLIC PANEL
Anion gap: 9 (ref 5–15)
BUN: 33 mg/dL — ABNORMAL HIGH (ref 8–23)
CO2: 26 mmol/L (ref 22–32)
Calcium: 9.1 mg/dL (ref 8.9–10.3)
Chloride: 98 mmol/L (ref 98–111)
Creatinine, Ser: 0.81 mg/dL (ref 0.61–1.24)
GFR calc Af Amer: >60 mL/min (ref >60)
GFR calc non Af Amer: >60 mL/min (ref >60)
Glucose, Bld: 218 mg/dL — ABNORMAL HIGH (ref 70–99)
Potassium: 4.3 mmol/L (ref 3.5–5.1)
Sodium: 133 mmol/L — ABNORMAL LOW (ref 135–145)

## 2019-11-19 LAB — CBC
HCT: 43.5 % (ref 39.0–52.0)
Hemoglobin: 14.9 g/dL (ref 13.0–17.0)
MCH: 28.5 pg (ref 26.0–34.0)
MCHC: 34.3 g/dL (ref 30.0–36.0)
MCV: 83.3 fL (ref 80.0–100.0)
Platelets: 275 10*3/uL (ref 150–400)
RBC: 5.22 MIL/uL (ref 4.22–5.81)
RDW: 13.2 % (ref 11.5–15.5)
WBC: 10.7 10*3/uL — ABNORMAL HIGH (ref 4.0–10.5)
nRBC: 0 % (ref 0.0–0.2)

## 2019-11-19 MED ORDER — MECLIZINE HCL 25 MG PO TABS
25.0000 mg | ORAL_TABLET | Freq: Two times a day (BID) | ORAL | Status: DC | PRN
  Filled 2019-11-19: qty 1

## 2019-11-19 NOTE — Progress Notes (Signed)
Physical Therapy Treatment Patient Details Name: Francis Sims. MRN: [id number] DOB: 1941-03-09 Today's Date: 11/19/2019    History of Present Illness presented to ER secondary to acute onset of L UE weakness/numbness; admitted for TIA/CVA work -up.  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    PT Comments    Pt ready for session.  BP WFL prior to session.  To EOB with min guard /supervsion.  Pt stands from bed to RW with no use of UE's this am.  Pt pleased with his ability.  RW for support/balance once standing.  He is able to progress gait x 2 laps around unit.  Very short choppy steps with minimal knee flexion/step height.  He needs cues to step up into walker at times and reports shoulder fatigue from walker use.  Standing rest break encouraged but he only takes a few moments.  RN in room and observed gait and reported him moving his feet better today and pt agrees.  Remained up in chair with RN to complete care.   Follow Up Recommendations  CIR;SNF     Equipment Recommendations       Recommendations for Other Services       Precautions / Restrictions Precautions Precautions: Fall Precaution Comments: watch orthostatics Restrictions Weight Bearing Restrictions: No    Mobility  Bed Mobility Overal bed mobility: Needs Assistance Bed Mobility: Supine to Sit   Sidelying to sit: Min guard;Supervision          Transfers Overall transfer level: Needs assistance Equipment used: Rolling walker (2 wheeled) Transfers: Sit to/from Stand Sit to Stand: Min guard         General transfer comment: slighlty elevated bed.  does not use hands to push up and is pleased with his ability to stand without UE's today  Ambulation/Gait Ambulation/Gait assistance: Min assist;Min guard Gait Distance (Feet): 320 Feet Assistive device: Rolling walker (2 wheeled) Gait Pattern/deviations: Step-to pattern;Ataxic;Staggering left;Decreased stance time - left;Decreased step length -  right;Decreased step length - left Gait velocity: decreased   General Gait Details: very short, choppy steps with minimal knee flexion with gait.  Cues to step up into walker x 3 with gait.  No overt LOB's but pt remains at increased risk for falls and poor recovery strategies.   Stairs             Wheelchair Mobility    Modified Rankin (Stroke Patients Only)       Balance Overall balance assessment: Needs assistance Sitting-balance support: Feet supported Sitting balance-Leahy Scale: Good     Standing balance support: Bilateral upper extremity supported Standing balance-Leahy Scale: Fair Standing balance comment: relies on walker for support/balance.  arms fatigue with gait from tense posturing.                            Cognition Arousal/Alertness: Awake/alert Behavior During Therapy: WFL for tasks assessed/performed Overall Cognitive Status: Within Functional Limits for tasks assessed                                 General Comments: Pt A and O x 4       Exercises      General Comments        Pertinent Vitals/Pain Pain Assessment: No/denies pain    Home Living  Prior Function            PT Goals (current goals can now be found in the care plan section) Progress towards PT goals: Progressing toward goals    Frequency    7X/week      PT Plan Current plan remains appropriate    Co-evaluation              AM-PAC PT "6 Clicks" Mobility   Outcome Measure  Help needed turning from your back to your side while in a flat bed without using bedrails?: None Help needed moving from lying on your back to sitting on the side of a flat bed without using bedrails?: A Little Help needed moving to and from a bed to a chair (including a wheelchair)?: A Little Help needed standing up from a chair using your arms (e.g., wheelchair or bedside chair)?: A Little Help needed to walk in hospital room?:  A Little Help needed climbing 3-5 steps with a railing? : A Lot 6 Click Score: 18    End of Session Equipment Utilized During Treatment: Gait belt Activity Tolerance: Patient tolerated treatment well Patient left: in chair;with call bell/phone within reach;with nursing/sitter in room;with family/visitor present;with chair alarm set Nurse Communication: Mobility status Hemiplegia - Right/Left: Left Hemiplegia - dominant/non-dominant: Non-dominant Hemiplegia - caused by: Cerebral infarction     Time: 1100-1138 PT Time Calculation (min) (ACUTE ONLY): 38 min  Charges:  $Gait Training: 23-37 mins $Therapeutic Activity: 8-22 mins                    Chesley Noon, PTA 11/19/19, 1:09 PM

## 2019-11-19 NOTE — Progress Notes (Signed)
PROGRESS NOTE    Francis Sims.  YNW:[id number] DOB: 08-10-1940 DOA: 11/09/2019 PCP: Cassandria Anger, MD       Assessment & Plan:   Principal Problem:   Acute ischemic right MCA stroke [facility]) Active Problems:   PERIPHERAL VASCULAR DISEASE   Dyslipidemia   Situational mixed anxiety and depressive disorder   Accelerated hypertension   Stroke (cerebrum) (HCC)   Acute ischemic right MCA CVA: CTA head and neck showed severe stenosis at right P1-P2 junction, no other significant stenosis. Echo shows EF of 60-35% with no wall motion abnormality and LVH. Neurology recommends dual antiplatelet therapy with aspirin & plavix x 3 weeks followed by plavix monotherapy daily. Pt intolerant to statin. PT/OT recommends CIR. Pt's insurance denied CIR but is ok w/ SNF.  Acute hypoxia: resolved  Uncontrolled HTN: continue on home dose of amlodipine, metoprolol  Anxiety and depressive disorder: severity unknown. Ativan prn.  BPH: continue flomax     DVT prophylaxis:  lovenox Code Status: full  Family Communication: Disposition Plan: d/c to CIR but insurance denied CIR but is ok w/ SNF. Likely d/c to SNF. Accepted at [facility] but awaiting on insurance auth still  Status is: Inpatient  Remains inpatient appropriate because:Unsafe d/c plan   Dispo: The patient is from: Home              Anticipated d/c is to: CIR vs SNF              Anticipated d/c date is: 1-2 days               Patient currently is medically stable to d/c.  Insurance denied CIR but is ok w/              SNF. Insurance still denied CIR after peer to peer review. Accepted at [facility]                 but waiting on insurance auth still   Consultants:   neuro   Procedures:    Antimicrobials:    Subjective: Pt c/o nasal congestion.  Objective: Vitals:   11/18/19 2100 11/18/19 2200 11/18/19 2300 11/18/19 2320  BP:    109/63  Pulse: 71 75 60 (!) 58  Resp: 20 (!) 22 17 (!) 22  Temp:    97.9 F (36.6 C)   TempSrc:    Oral  SpO2: 94% 95% 92% 95%  Weight:      Height:        Intake/Output Summary (Last 24 hours) at 11/19/2019 0732 Last data filed at 11/19/2019 6578 Gross per 24 hour  Intake --  Output 900 ml  Net -900 ml   Filed Weights   11/09/19 0950  Weight: 88.5 kg    Examination:  General exam: Appears calm and comfortable  Respiratory system: Clear to auscultation. Normal RR.  Cardiovascular system: S1 & S2 +. No rubs, gallops or clicks.  Gastrointestinal system: Abdomen is nondistended, soft and nontender. Normal bowel sounds heard. Central nervous system: Alert and oriented. Moves all 4 extremities  Psychiatry: Judgement and insight appear normal. Frustrated.     Data Reviewed: I have personally reviewed following labs and imaging studies  CBC: Recent Labs  Lab 11/16/19 0450 11/17/19 0525 11/18/19 0649 11/19/19 0450  WBC 9.3 10.3 9.0 10.7*  HGB 15.3 15.3 15.4 14.9  HCT 45.4 44.7 45.3 43.5  MCV 84.2 82.6 83.3 83.3  PLT 252 262 266 469   Basic Metabolic Panel: Recent Labs  Lab 11/14/19 0724 11/16/19 0450 11/17/19 0525 11/18/19 0649 11/19/19 0450  NA 133* 136 136 136 133*  K 4.1 4.3 4.4 4.0 4.3  CL 99 99 100 98 98  CO2 24 28 29 28 26   GLUCOSE 146* 138* 151* 180* 218*  BUN 29* 33* 27* 31* 33*  CREATININE 1.09 1.11 1.05 0.94 0.81  CALCIUM 9.0 9.2 8.9 9.0 9.1   GFR: Estimated Creatinine Clearance: 90.2 mL/min (by C-G formula based on SCr of 0.81 mg/dL). Liver Function Tests: No results for input(s): AST, ALT, ALKPHOS, BILITOT, PROT, ALBUMIN in the last 168 hours. No results for input(s): LIPASE, AMYLASE in the last 168 hours. No results for input(s): AMMONIA in the last 168 hours. Coagulation Profile: No results for input(s): INR, PROTIME in the last 168 hours. Cardiac Enzymes: No results for input(s): CKTOTAL, CKMB, CKMBINDEX, TROPONINI in the last 168 hours. BNP (last 3 results) No results for input(s): PROBNP in the last 8760 hours. HbA1C: No  results for input(s): HGBA1C in the last 72 hours. CBG: Recent Labs  Lab 11/14/19 0802 11/14/19 1148  GLUCAP 142* 203*   Lipid Profile: No results for input(s): CHOL, HDL, LDLCALC, TRIG, CHOLHDL, LDLDIRECT in the last 72 hours. Thyroid Function Tests: No results for input(s): TSH, T4TOTAL, FREET4, T3FREE, THYROIDAB in the last 72 hours. Anemia Panel: No results for input(s): VITAMINB12, FOLATE, FERRITIN, TIBC, IRON, RETICCTPCT in the last 72 hours. Sepsis Labs: No results for input(s): PROCALCITON, LATICACIDVEN in the last 168 hours.  Recent Results (from the past 240 hour(s))  SARS Coronavirus 2 by RT PCR (hospital order, performed in [facility] hospital lab) Nasopharyngeal Nasopharyngeal Swab     Status: None   Collection Time: 11/09/19  4:45 PM   Specimen: Nasopharyngeal Swab  Result Value Ref Range Status   SARS Coronavirus 2 NEGATIVE NEGATIVE Final    Comment: (NOTE) SARS-CoV-2 target nucleic acids are NOT DETECTED. The SARS-CoV-2 RNA is generally detectable in upper and lower respiratory specimens during the acute phase of infection. The lowest concentration of SARS-CoV-2 viral copies this assay can detect is 250 copies / mL. A negative result does not preclude SARS-CoV-2 infection and should not be used as the sole basis for treatment or other patient management decisions.  A negative result may occur with improper specimen collection / handling, submission of specimen other than nasopharyngeal swab, presence of viral mutation(s) within the areas targeted by this assay, and inadequate number of viral copies (<250 copies / mL). A negative result must be combined with clinical observations, patient history, and epidemiological information. Fact Sheet for Patients:   StrictlyIdeas.no Fact Sheet for Healthcare Providers: BankingDealers.co.za This test is not yet approved or cleared  by the [place] FDA and has been  authorized for detection and/or diagnosis of SARS-CoV-2 by FDA under an Emergency Use Authorization (EUA).  This EUA will remain in effect (meaning this test can be used) for the duration of the COVID-19 declaration under Section 564(b)(1) of the Act, 21 U.S.C. section 360bbb-3(b)(1), unless the authorization is terminated or revoked sooner. Performed at [facility], [address], [place], [place] [address]          Radiology Studies: No results found.      Scheduled Meds: .  stroke: mapping our early stages of recovery book   Does not apply Once  . amLODipine  5 mg Oral Daily  . vitamin C  250 mg Oral Daily  . aspirin  81 mg Oral Daily  . cholecalciferol  1,000 Units Oral Daily  . clopidogrel  75 mg Oral Daily  . enoxaparin (LOVENOX) injection  40 mg Subcutaneous Q24H  . feeding supplement (ENSURE ENLIVE)  237 mL Oral TID BM  . hydrALAZINE  50 mg Oral Q6H  . loratadine  10 mg Oral Daily  . melatonin  5 mg Oral QHS  . metoprolol tartrate  25 mg Oral BID  . multivitamin with minerals  1 tablet Oral Daily  . olopatadine  1 drop Both Eyes BID  . omega-3 acid ethyl esters  1 g Oral Daily  . tamsulosin  0.4 mg Oral Daily   Continuous Infusions:   LOS: 10 days    Time spent: 32 mins     Wyvonnia Dusky, MD [facility] Pager [phone]  If 7PM-7AM, please contact night-coverage [email or web address] 11/19/2019, 7:32 AM

## 2019-11-20 LAB — BASIC METABOLIC PANEL
Anion gap: 7 (ref 5–15)
BUN: 29 mg/dL — ABNORMAL HIGH (ref 8–23)
CO2: 29 mmol/L (ref 22–32)
Calcium: 9.1 mg/dL (ref 8.9–10.3)
Chloride: 100 mmol/L (ref 98–111)
Creatinine, Ser: 1.21 mg/dL (ref 0.61–1.24)
GFR calc Af Amer: >60 mL/min (ref >60)
GFR calc non Af Amer: 58 mL/min — ABNORMAL LOW (ref >60)
Glucose, Bld: 132 mg/dL — ABNORMAL HIGH (ref 70–99)
Potassium: 4.7 mmol/L (ref 3.5–5.1)
Sodium: 136 mmol/L (ref 135–145)

## 2019-11-20 LAB — CBC
HCT: 45.3 % (ref 39.0–52.0)
Hemoglobin: 15.2 g/dL (ref 13.0–17.0)
MCH: 28.3 pg (ref 26.0–34.0)
MCHC: 33.6 g/dL (ref 30.0–36.0)
MCV: 84.2 fL (ref 80.0–100.0)
Platelets: 268 10*3/uL (ref 150–400)
RBC: 5.38 MIL/uL (ref 4.22–5.81)
RDW: 13.4 % (ref 11.5–15.5)
WBC: 10.3 10*3/uL (ref 4.0–10.5)
nRBC: 0 % (ref 0.0–0.2)

## 2019-11-20 LAB — SARS CORONAVIRUS 2 BY RT PCR (HOSPITAL ORDER, PERFORMED IN ~~LOC~~ HOSPITAL LAB): SARS Coronavirus 2: NEGATIVE

## 2019-11-20 NOTE — Progress Notes (Signed)
PROGRESS NOTE    Francis Sims.  BOF:[id number] DOB: [birth year] DOA: 11/09/2019 PCP: Cassandria Anger, MD       Assessment & Plan:   Principal Problem:   Acute ischemic right MCA stroke [facility]) Active Problems:   PERIPHERAL VASCULAR DISEASE   Dyslipidemia   Situational mixed anxiety and depressive disorder   Accelerated hypertension   Stroke (cerebrum) (HCC)   Acute ischemic right MCA CVA: CTA head and neck showed severe stenosis at right P1-P2 junction, no other significant stenosis. Echo shows EF of 60-35% with no wall motion abnormality and LVH. Neurology recommends dual antiplatelet therapy with aspirin & plavix x 3 weeks followed by plavix monotherapy daily. Pt intolerant to statin. PT/OT recommends CIR. Pt's insurance denied CIR but is ok w/ SNF. Awaiting for insurance auth still   Acute hypoxia: resolved  Uncontrolled HTN: continue on home dose of amlodipine, metoprolol  Anxiety and depressive disorder: severity unknown. Ativan prn.  BPH: continue flomax     DVT prophylaxis:  lovenox Code Status: full  Family Communication: Disposition Plan: d/c to CIR but insurance denied CIR but is ok w/ SNF. Likely d/c to SNF. Accepted at [facility] but awaiting on insurance auth still  Status is: Inpatient  Remains inpatient appropriate because:Unsafe d/c plan   Dispo: The patient is from: Home              Anticipated d/c is to: CIR vs SNF              Anticipated d/c date is: 1-2 days               Patient currently is medically stable to d/c.  Insurance denied CIR but is ok w/              SNF. Insurance still denied CIR after peer to peer review. Accepted at [facility]                 but waiting on insurance auth still   Consultants:   neuro   Procedures:    Antimicrobials:    Subjective: Pt c/o fatigue  Objective: Vitals:   11/19/19 1159 11/19/19 2136 11/19/19 2347 11/20/19 0657  BP: 128/70 132/65 115/61 (!) 154/90  Pulse: 74 72 63 79  Resp: 19 17  16    Temp: 97.8 F (36.6 C) 98.4 F (36.9 C) 97.7 F (36.5 C)   TempSrc: Oral Oral Oral   SpO2: 97% 95% 95% 97%  Weight:      Height:        Intake/Output Summary (Last 24 hours) at 11/20/2019 0726 Last data filed at 11/20/2019 0659 Gross per 24 hour  Intake --  Output 625 ml  Net -625 ml   Filed Weights   11/09/19 0950  Weight: 88.5 kg    Examination:  General exam: Appears calm and comfortable  Respiratory system: Clear to auscultation. No wheezes, rales Cardiovascular system: S1 & S2 +. No rubs, gallops or clicks.  Gastrointestinal system: Abdomen is nondistended, soft and nontender. Normal bowel sounds heard. Central nervous system: Alert and oriented. Moves all 4 extremities  Psychiatry: Judgement and insight appear normal. Frustrated.     Data Reviewed: I have personally reviewed following labs and imaging studies  CBC: Recent Labs  Lab 11/16/19 0450 11/17/19 0525 11/18/19 0649 11/19/19 0450 11/20/19 0520  WBC 9.3 10.3 9.0 10.7* 10.3  HGB 15.3 15.3 15.4 14.9 15.2  HCT 45.4 44.7 45.3 43.5 45.3  MCV 84.2 82.6 83.3 83.3  84.2  PLT 252 262 266 275 431   Basic Metabolic Panel: Recent Labs  Lab 11/16/19 0450 11/17/19 0525 11/18/19 0649 11/19/19 0450 11/20/19 0520  NA 136 136 136 133* 136  K 4.3 4.4 4.0 4.3 4.7  CL 99 100 98 98 100  CO2 28 29 28 26 29   GLUCOSE 540* 151* 180* 218* 132*  BUN 33* 27* 31* 33* 29*  CREATININE 1.11 1.05 0.94 0.81 1.21  CALCIUM 9.2 8.9 9.0 9.1 9.1   GFR: Estimated Creatinine Clearance: 60.4 mL/min (by C-G formula based on SCr of 1.21 mg/dL). Liver Function Tests: No results for input(s): AST, ALT, ALKPHOS, BILITOT, PROT, ALBUMIN in the last 168 hours. No results for input(s): LIPASE, AMYLASE in the last 168 hours. No results for input(s): AMMONIA in the last 168 hours. Coagulation Profile: No results for input(s): INR, PROTIME in the last 168 hours. Cardiac Enzymes: No results for input(s): CKTOTAL, CKMB, CKMBINDEX,  TROPONINI in the last 168 hours. BNP (last 3 results) No results for input(s): PROBNP in the last 8760 hours. HbA1C: No results for input(s): HGBA1C in the last 72 hours. CBG: Recent Labs  Lab 11/14/19 0802 11/14/19 1148  GLUCAP 142* 203*   Lipid Profile: No results for input(s): CHOL, HDL, LDLCALC, TRIG, CHOLHDL, LDLDIRECT in the last 72 hours. Thyroid Function Tests: No results for input(s): TSH, T4TOTAL, FREET4, T3FREE, THYROIDAB in the last 72 hours. Anemia Panel: No results for input(s): VITAMINB12, FOLATE, FERRITIN, TIBC, IRON, RETICCTPCT in the last 72 hours. Sepsis Labs: No results for input(s): PROCALCITON, LATICACIDVEN in the last 168 hours.  No results found for this or any previous visit (from the past 240 hour(s)).       Radiology Studies: No results found.      Scheduled Meds: .  stroke: mapping our early stages of recovery book   Does not apply Once  . amLODipine  5 mg Oral Daily  . vitamin C  250 mg Oral Daily  . aspirin  81 mg Oral Daily  . cholecalciferol  1,000 Units Oral Daily  . clopidogrel  75 mg Oral Daily  . enoxaparin (LOVENOX) injection  40 mg Subcutaneous Q24H  . feeding supplement (ENSURE ENLIVE)  237 mL Oral TID BM  . hydrALAZINE  50 mg Oral Q6H  . loratadine  10 mg Oral Daily  . melatonin  5 mg Oral QHS  . metoprolol tartrate  25 mg Oral BID  . multivitamin with minerals  1 tablet Oral Daily  . olopatadine  1 drop Both Eyes BID  . omega-3 acid ethyl esters  1 g Oral Daily  . tamsulosin  0.4 mg Oral Daily   Continuous Infusions:   LOS: 11 days    Time spent: 30 mins     Wyvonnia Dusky, MD [facility] Pager [phone]  If 7PM-7AM, please contact night-coverage [email or web address] 11/20/2019, 7:26 AM

## 2019-11-20 NOTE — Progress Notes (Signed)
To Whom It May Concern:  Please be advised that the above-named patient will require a short-term nursing home stay - anticipated 30 days or less for rehabilitation and strengthening.  The plan is for return home.  

## 2019-11-20 NOTE — TOC Progression Note (Signed)
Transition of Care (TOC) - Progression Note    Patient Details  Name: Francis Sims. MRN: [id number] Date of Birth: [birth year]  Transition of Care [facility]) CM/SW Contact  Shelbie Hutching, RN Phone Number: 11/20/2019, 10:59 AM  Clinical Narrative:    [facility] reports that [facility] authorization is still pending.    Expected Discharge Plan: [place] Barriers to Discharge: Insurance Authorization(and waiting on bed offer)  Expected Discharge Plan and Services Expected Discharge Plan: [place]   Discharge Planning Services: CM Consult Post Acute Care Choice: [facility] Living arrangements for the past 2 months: Single Family Home                                       Social Determinants of Health (SDOH) Interventions    Readmission Risk Interventions No flowsheet data found.

## 2019-11-20 NOTE — Progress Notes (Addendum)
Physical Therapy Treatment Patient Details Name: Francis Sims. MRN: [id number] DOB: 02/12/1941 Today's Date: 11/20/2019    History of Present Illness presented to ER secondary to acute onset of L UE weakness/numbness; admitted for TIA/CVA work -up.  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    PT Comments    Pt ready for session but does report some sleepiness today.  To EOB with rail but no assist and pt pleased with his progress.  Sitting steady with only minor dizziness which clears quickly.  Stood with min guard to RW.  No dizziness reported.  He is able to complete almost 2 laps around unit this am.  Turn made in hallway to walk back as pt was being transferred and hallway busy.  He continues with very short choppy steps with little no no knee flexion/foot clearance.  He needs continued cues to keep walker close.  Walker height was decreased upon return to room to see if that helps during next session.  He fatigued quicker today and upon returning to room needed cues to stop and rest due to decreasing gait quality and LLE outside of walker box occasionally kicking the walker leg.  He returned to room and rested on bed with overall poor safety and increasing fall risk.  While sitting on bed, education provided regarding awareness of limitations (pt was set on walking 3 laps today but was instructed to sit after 2 due to fatigue).  He stated he was aware he was getting tired and quality was decreasing but made no changes or attempted strategies such as stopping to rest, slowing down etc to compensate. After seated rest, he did stand for ex as below and transferred to chair for breakfast.  Again during ex, he required min a x 1 for balance with cues to correct imbalances before continuing.  LOB to L noted more frequently.   Pt remains a high fall risk due to gait deficits, balance and overall awareness of limitations and compensatory strategies.  SNF remains appropriate for discharge.      Follow Up Recommendations  SNF     Equipment Recommendations       Recommendations for Other Services       Precautions / Restrictions Precautions Precautions: Fall Precaution Comments: watch orthostatics Restrictions Weight Bearing Restrictions: No    Mobility  Bed Mobility Overal bed mobility: Needs Assistance Bed Mobility: Supine to Sit Rolling: Supervision   Supine to sit: Supervision     General bed mobility comments: able to get to EOB without help today.  Pt pleased with abilities.  Transfers Overall transfer level: Needs assistance Equipment used: Rolling walker (2 wheeled) Transfers: Sit to/from Stand Sit to Stand: Min guard            Ambulation/Gait Ambulation/Gait assistance: Min assist;Min guard Gait Distance (Feet): 300 Feet Assistive device: Rolling walker (2 wheeled) Gait Pattern/deviations: Step-to pattern;Ataxic;Staggering left;Decreased stance time - left;Decreased step length - right;Decreased step length - left Gait velocity: decreased   General Gait Details: very short, choppy steps with minimal knee flexion with gait.  Cues to step up into walker x 3 with gait.  No overt LOB's but pt remains at increased risk for falls and poor recovery strategies and overall poor awareness of fatigue/safety.   Stairs             Wheelchair Mobility    Modified Rankin (Stroke Patients Only)       Balance Overall balance assessment: Needs assistance Sitting-balance support: Feet  supported Sitting balance-Leahy Scale: Good     Standing balance support: Bilateral upper extremity supported Standing balance-Leahy Scale: Fair Standing balance comment: relies on walker for support/balance.  arms fatigue with gait from tense posturing.                            Cognition Arousal/Alertness: Awake/alert Behavior During Therapy: WFL for tasks assessed/performed Overall Cognitive Status: Within Functional Limits for tasks  assessed                                 General Comments: Pt A and O x 4       Exercises Other Exercises Other Exercises: Standing ex with walker and + 1 assist due to frequent LOB's.  marches, SLR and heel raises.  Poor awareness of balance and needing to stop to catch balance before cotinuing movement.    General Comments        Pertinent Vitals/Pain Pain Assessment: No/denies pain    Home Living                      Prior Function            PT Goals (current goals can now be found in the care plan section) Progress towards PT goals: Progressing toward goals    Frequency    7X/week      PT Plan Current plan remains appropriate    Co-evaluation              AM-PAC PT "6 Clicks" Mobility   Outcome Measure  Help needed turning from your back to your side while in a flat bed without using bedrails?: None Help needed moving from lying on your back to sitting on the side of a flat bed without using bedrails?: None Help needed moving to and from a bed to a chair (including a wheelchair)?: A Little Help needed standing up from a chair using your arms (e.g., wheelchair or bedside chair)?: A Little Help needed to walk in hospital room?: A Little Help needed climbing 3-5 steps with a railing? : A Lot 6 Click Score: 19    End of Session Equipment Utilized During Treatment: Gait belt Activity Tolerance: Patient tolerated treatment well Patient left: in chair;with call bell/phone within reach;with chair alarm set Nurse Communication: Mobility status Hemiplegia - Right/Left: Left Hemiplegia - dominant/non-dominant: Non-dominant Hemiplegia - caused by: Cerebral infarction     Time: 0902-0926 PT Time Calculation (min) (ACUTE ONLY): 24 min  Charges:  $Gait Training: 8-22 mins $Therapeutic Exercise: 8-22 mins                    Chesley Noon, PTA 11/20/19, 10:29 AM

## 2019-11-20 NOTE — Care Management Important Message (Signed)
Important Message  Patient Details  Name: Francis Sims. MRN: [id number] Date of Birth: 04/23/1941   Medicare Important Message Given:  Yes     Juliann Pulse A Glenis Musolf 11/20/2019, 11:08 AM

## 2019-11-21 NOTE — Progress Notes (Signed)
Occupational Therapy Treatment Patient Details Name: Francis Sims. MRN: [id number] DOB: 27-May-1941 Today's Date: 11/21/2019    History of present illness presented to ER secondary to acute onset of L UE weakness/numbness; admitted for TIA/CVA work -up.  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   OT comments  Francis Sims presents this afternoon motivated to get OOB and participate in therapy. Throughout session, he demonstrated improvements in sitting and standing balance and LUE fine motor coordination resulting in improved functional mobility and ADL independence. Therapist offered encouragement and affirmation of his progress throughout. He completed bed mobility with Mod I, using BUE (R > L) and sat independently at EOB x5 min during dynamic sitting balance involving reaching, b/l coordination and [place] across midline with BUE unsupported. Pt was CGA using RW for sit <> stand (1 rocking attempt, corrected with Min VCs) and completed hygiene while standing at the sink, engaging BUE for object manipulation. He maintained unsupported standing balance briefly with unsteadiness noted and posterior lean, though no overt LOB. Requires RW and CGA to maintain steady dynamic standing balance. Pt endorsed left side fatigue at ~5 min standing, and returned to bed using RW and CGA for ambulation.   Pt educated on neuromuscular re-ed, stroke recovery, L side use and optimal positioning in order to maximize recovery and return to PLOF. Pt very receptive to education and eager to implement. He will continue to benefit from skilled acute OT services to address these functional impairments. Continue to recommend CIR upon discharge to optimize pt outcomes and return to PLOF.    Follow Up Recommendations  CIR    Equipment Recommendations       Recommendations for Other Services      Precautions / Restrictions Precautions Precautions: Fall Precaution Comments: watch orthostatics Restrictions Weight  Bearing Restrictions: No       Mobility Bed Mobility Overal bed mobility: Needs Assistance Bed Mobility: Rolling;Supine to Sit;Sit to Supine Rolling: Modified independent (Device/Increase time)(rolled to R side using LUE to grab handrail)   Supine to sit: Modified independent (Device/Increase time);HOB elevated Sit to supine: Modified independent (Device/Increase time)   General bed mobility comments: increased time required, but overall improved technique and decreased effort to complete  Transfers Overall transfer level: Needs assistance Equipment used: Rolling walker (2 wheeled) Transfers: Sit to/from Stand Sit to Stand: Min guard         General transfer comment: CGA for safety, one rocking attempt forward but corrected with Min VCs, posterior push present with therapist assist provided to stabilize RW    Balance Overall balance assessment: Needs assistance Sitting-balance support: Feet supported;No upper extremity supported Sitting balance-Leahy Scale: Good Sitting balance - Comments: maintained upright posture, steady static and dynamic balance noted when reaching outside of BOS and across midline Postural control: Posterior lean Standing balance support: During functional activity;Bilateral upper extremity supported;No upper extremity supported;Single extremity supported Standing balance-Leahy Scale: Fair Standing balance comment: Fair+ with use of RW for UE support/balance and CGA; Fair- with no UE supported during tooth brushing, unsteadiness noted with posterior lean and CGA required                           ADL either performed or assessed with clinical judgement   ADL Overall ADL's : Needs assistance/impaired     Grooming: Oral care;Brushing hair;Standing;With adaptive equipment;Min guard Grooming Details (indicate cue type and reason): stood at sink using RW with CGA to brush teeth and comb  hair, self cueing for L hand engagement                              Functional mobility during ADLs: Min guard;Rolling walker General ADL Comments: CGA with RW for standing at the sink for grooming. Improved balance and b/l weight shifting noted, along with improvd LUE engagement. Pt endorsed LUE/LLE fatigue at ~5 min standing mark during ADL.     Vision Baseline Vision/History: Wears glasses Wears Glasses: At all times Patient Visual Report: No change from baseline     Perception     Praxis      Cognition   Behavior During Therapy: [place] for tasks assessed/performed Overall Cognitive Status: Within Functional Limits for tasks assessed                                 General Comments: Pt A and O x 4, motivated and eager to return to [name]        Exercises Other Exercises Other Exercises: Sitting dynamic balance at EOB involving reaching, b/l coordination and [place] across midline Other Exercises: Pt educated on neuromuscular re-ed, stroke recovery, L side use and optimal positioning in order to maximize recovery and return to PLOF. Pt very receptive to education and eager to implement.   Shoulder Instructions       General Comments Pt endorsed brief dizziness upon standing; instructed in rest break and pursed lip breathing and dizziness resolved    Pertinent Vitals/ Pain       Pain Assessment: No/denies pain  Home Living                                          Prior Functioning/Environment              Frequency  Min 3X/week        Progress Toward Goals  OT Goals(current goals can now be found in the care plan section)  Progress towards OT goals: Progressing toward goals  Acute Rehab OT Goals Patient Stated Goal: to get to rehab and regain independence  OT Goal Formulation: With patient Time For Goal Achievement: 11/24/19 Potential to Achieve Goals: Good  Plan Discharge plan remains appropriate;Frequency remains appropriate    Co-evaluation                  AM-PAC OT "6 Clicks" Daily Activity     Outcome Measure   Help from another person eating meals?: None Help from another person taking care of personal grooming?: A Little Help from another person toileting, which includes using toliet, bedpan, or urinal?: A Little Help from another person bathing (including washing, rinsing, drying)?: A Little Help from another person to put on and taking off regular upper body clothing?: A Little Help from another person to put on and taking off regular lower body clothing?: A Little 6 Click Score: 19    End of Session Equipment Utilized During Treatment: Gait belt;Rolling walker  OT Visit Diagnosis: Hemiplegia and hemiparesis;Repeated falls (R29.6);Ataxia, unspecified (R27.0) Hemiplegia - Right/Left: Left Hemiplegia - dominant/non-dominant: Non-Dominant Hemiplegia - caused by: Cerebral infarction   Activity Tolerance Patient tolerated treatment well   Patient Left in bed;with call bell/phone within reach;with bed alarm set   Nurse Communication  Time: 1340-1407 OT Time Calculation (min): 27 min  Charges: OT General Charges $OT Visit: 1 Visit OT Treatments $Self Care/Home Management : 23-37 mins  Jerilynn Birkenhead, OTS 11/21/19, 2:50 PM

## 2019-11-21 NOTE — Progress Notes (Signed)
Physical Therapy Treatment Patient Details Name: Francis Sims. MRN: [id number] DOB: 03/16/1941 Today's Date: 11/21/2019    History of Present Illness presented to ER secondary to acute onset of L UE weakness/numbness; admitted for TIA/CVA work -up.  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    PT Comments    Pt was supine in bed upon arriving with spouse present. Pt and spouse frustrated about still awaiting insurance authorization to go to rehab. He is very motivated and agreeable to PT session and OOB activity. BP stable throughout without c/o dizziness/lightheadedness. He was able to exit L side of bed with min assist to perform log roll technique. Sat EOB x several minutes prior to standing to RW and ambulating two laps in hallway. Pt continues to have coordination/strength/ balance deficits (mostly on LLE) that impacts safe functional mobility. Both spouse and pt feel they can not safely manage at home. PT continues recommendation for DC to rehab. He will continue to benefit from skilled PT to address deficits and assist pt to returning to PLOF.     Follow Up Recommendations  SNF     Equipment Recommendations  None recommended by PT    Recommendations for Other Services       Precautions / Restrictions Precautions Precautions: Fall Precaution Comments: watch orthostatics Restrictions Weight Bearing Restrictions: No    Mobility  Bed Mobility Overal bed mobility: Needs Assistance Bed Mobility: Supine to Sit;Rolling;Sidelying to Sit Rolling: Supervision Sidelying to sit: Min assist Supine to sit: Min assist     General bed mobility comments: Increased time required + vcs for technique. pt performed log roll L to short sit.   Transfers Overall transfer level: Needs assistance Equipment used: Rolling walker (2 wheeled) Transfers: Sit to/from Stand Sit to Stand: Min guard         General transfer comment: CGA for safety with moderate Vcs for improved  technique. pt has posterior push present but with vcs for improved fwd wt shift, was able to stand with CGA only  Ambulation/Gait Ambulation/Gait assistance: Min guard;Min assist Gait Distance (Feet): 300 Feet Assistive device: Rolling walker (2 wheeled) Gait Pattern/deviations: Step-through pattern;Decreased stance time - left;Decreased step length - right;Decreased stride length;Staggering left;Trunk flexed Gait velocity: decreased   General Gait Details: pt was able to tolerate ambulation 2 laps in hallway with mostly CGA however  does have episodes of unsteadiness with Min assist to prevent LOB/fall   Stairs             Wheelchair Mobility    Modified Rankin (Stroke Patients Only)       Balance Overall balance assessment: Needs assistance Sitting-balance support: Feet supported Sitting balance-Leahy Scale: Good     Standing balance support: Bilateral upper extremity supported Standing balance-Leahy Scale: Fair Standing balance comment: relies on walker for support/balance.  arms fatigue with gait                             Cognition Arousal/Alertness: Awake/alert Behavior During Therapy: WFL for tasks assessed/performed Overall Cognitive Status: Within Functional Limits for tasks assessed                                 General Comments: Pt A and O x 4       Exercises      General Comments        Pertinent Vitals/Pain Pain  Assessment: No/denies pain Pain Score: 0-No pain Pain Location: LBP Pain Descriptors / Indicators: Sore Pain Intervention(s): Limited activity within patient's tolerance;Monitored during session;Premedicated before session;Repositioned    Home Living                      Prior Function            PT Goals (current goals can now be found in the care plan section) Acute Rehab PT Goals Patient Stated Goal: to get to rehab and regain independence  Progress towards PT goals: Progressing toward  goals    Frequency    7X/week      PT Plan Current plan remains appropriate    Co-evaluation              AM-PAC PT "6 Clicks" Mobility   Outcome Measure  Help needed turning from your back to your side while in a flat bed without using bedrails?: None Help needed moving from lying on your back to sitting on the side of a flat bed without using bedrails?: None Help needed moving to and from a bed to a chair (including a wheelchair)?: A Little Help needed standing up from a chair using your arms (e.g., wheelchair or bedside chair)?: A Little Help needed to walk in hospital room?: A Little Help needed climbing 3-5 steps with a railing? : A Lot 6 Click Score: 19    End of Session Equipment Utilized During Treatment: Gait belt Activity Tolerance: Patient tolerated treatment well Patient left: in chair;with call bell/phone within reach;with chair alarm set Nurse Communication: Mobility status PT Visit Diagnosis: Difficulty in walking, not elsewhere classified (R26.2);Hemiplegia and hemiparesis Hemiplegia - Right/Left: Left Hemiplegia - dominant/non-dominant: Non-dominant Hemiplegia - caused by: Cerebral infarction     Time: 1016-1100 PT Time Calculation (min) (ACUTE ONLY): 44 min  Charges:  $Gait Training: 23-37 mins $Neuromuscular Re-education: 8-22 mins                     Julaine Fusi PTA 11/21/19, 1:43 PM

## 2019-11-21 NOTE — Progress Notes (Signed)
PROGRESS NOTE    Francis Sims.  SNK:[id number] DOB: 1941/05/21 DOA: 11/09/2019 PCP: Cassandria Anger, MD   HPI was taken from Dr. Clementeen Graham: Francis Sims. is a 79 y.o. male with medical history significant of GERD, situational mixed anxiety and depression, elevated blood pressure diagnosed at [facility], BPH edema and prediabetes (A1c of 6.3 based on last lab) who presented to the ED with 1 day history of left-sided weakness and numbness.  Patient reports being fine until last evening when he started having some weakness in his left lower leg and left forearm and hand associated with numbness mainly in the left leg and left hand.  This morning he woke up and had similar symptoms, feeling dizzy and unsteady on his feet.  As per wife patient has had 4 falls at home in the past 6 months without any loss of consciousness, focal weaknesses or injuries.  Denies headache, blurred vision, diplopia, nausea, vomiting, fevers, chills, chest pain, shortness of breath, abdominal pain, dysuria, diarrhea..  Denies any recent illness, change in the medications or sick contact.  Denies any fall.  Patient reports that he has had weight loss in the past 1 year since the death of his son and his wife being diagnosed with cancer but has started gaining the weight back.  At baseline he is quite active and plays tennis regularly.  Reports remote smoking history.  No alcohol or illicit drug use.   ED Course: In the ED vital stable except for elevated blood pressure 185/100 mmHg (was 210/130 mmHg on my evaluation). Blood work showed normal CBC and chemistry except for blood glucose of 144.  UA unremarkable. Head CT done negative for acute findings but showed chronic small vessel white matter disease.  MRI of the brain showed small acute infarct in the posterior limb of the right internal capsule.  Also shows chronic microvascular ischemic changes. Patient did not receive tPA as he was out of the  therapeutic window for tPA.  Patient given full dose aspirin and hospitalist consult for admission to progressive care.     Assessment & Plan:   Principal Problem:   Acute ischemic right MCA stroke (HCC) Active Problems:   PERIPHERAL VASCULAR DISEASE   Dyslipidemia   Situational mixed anxiety and depressive disorder   Accelerated hypertension   Stroke (cerebrum) (HCC)   Acute ischemic right MCA CVA: CTA head and neck showed severe stenosis at right P1-P2 junction, no other significant stenosis. Echo shows EF of 60-35% with no wall motion abnormality and LVH. Neurology recommends dual antiplatelet therapy with aspirin & plavix x 3 weeks followed by plavix monotherapy daily. Pt intolerant to statin. PT/OT recommends CIR. Pt's insurance denied CIR but is ok w/ SNF. Awaiting for insurance auth still   Acute hypoxia: resolved  Uncontrolled HTN: continue on home dose of amlodipine, metoprolol  Anxiety and depressive disorder: severity unknown. Ativan prn.  BPH: continue flomax     DVT prophylaxis:  lovenox Code Status: full  Family Communication: Disposition Plan: d/c to CIR but insurance denied CIR but is ok w/ SNF. Likely d/c to SNF. Accepted at [facility] but awaiting on insurance auth still  Status is: Inpatient  Remains inpatient appropriate because:Unsafe d/c plan   Dispo: The patient is from: Home              Anticipated d/c is to: CIR vs SNF              Anticipated d/c date is:  TBD              Patient currently is medically stable to d/c.  Insurance denied CIR but is ok w/              SNF. Insurance still denied CIR after peer to peer review. Accepted at [facility]                 but waiting on insurance auth still   Consultants:   neuro   Procedures:    Antimicrobials:    Subjective: Pt c/o malasie  Objective: Vitals:   11/20/19 1654 11/20/19 1956 11/20/19 2334 11/21/19 0403  BP: 132/62 (!) 144/74 139/68 129/72  Pulse: 66 72 66 69  Resp: 17 19 16 17     Temp: 97.9 F (36.6 C) 98.2 F (36.8 C) 97.8 F (36.6 C) 97.8 F (36.6 C)  TempSrc: Oral Oral Oral Oral  SpO2: 97% 98% 98% 97%  Weight:      Height:        Intake/Output Summary (Last 24 hours) at 11/21/2019 0739 Last data filed at 11/21/2019 0439 Gross per 24 hour  Intake 240 ml  Output 500 ml  Net -260 ml   Filed Weights   11/09/19 0950  Weight: 88.5 kg    Examination:  General exam: Appears calm and comfortable  Respiratory system: Clear to auscultation. Normal RR Cardiovascular system: S1 & S2 +. No rubs, gallops or clicks.  Gastrointestinal system: Abdomen is nondistended, soft and nontender. Normal bowel sounds heard. Central nervous system: Alert and oriented. Moves all 4 extremities  Psychiatry: Judgement and insight appear normal. Frustrated.     Data Reviewed: I have personally reviewed following labs and imaging studies  CBC: Recent Labs  Lab 11/16/19 0450 11/17/19 0525 11/18/19 0649 11/19/19 0450 11/20/19 0520  WBC 9.3 10.3 9.0 10.7* 10.3  HGB 15.3 15.3 15.4 14.9 15.2  HCT 45.4 44.7 45.3 43.5 45.3  MCV 84.2 82.6 83.3 83.3 84.2  PLT 252 262 266 275 916   Basic Metabolic Panel: Recent Labs  Lab 11/16/19 0450 11/17/19 0525 11/18/19 0649 11/19/19 0450 11/20/19 0520  NA 136 136 136 133* 136  K 4.3 4.4 4.0 4.3 4.7  CL 99 100 98 98 100  CO2 28 29 28 26 29   GLUCOSE 138* 151* 180* 218* 132*  BUN 33* 27* 31* 33* 29*  CREATININE 1.11 1.05 0.94 0.81 1.21  CALCIUM 9.2 8.9 9.0 9.1 9.1   GFR: Estimated Creatinine Clearance: 60.4 mL/min (by C-G formula based on SCr of 1.21 mg/dL). Liver Function Tests: No results for input(s): AST, ALT, ALKPHOS, BILITOT, PROT, ALBUMIN in the last 168 hours. No results for input(s): LIPASE, AMYLASE in the last 168 hours. No results for input(s): AMMONIA in the last 168 hours. Coagulation Profile: No results for input(s): INR, PROTIME in the last 168 hours. Cardiac Enzymes: No results for input(s): CKTOTAL, CKMB,  CKMBINDEX, TROPONINI in the last 168 hours. BNP (last 3 results) No results for input(s): PROBNP in the last 8760 hours. HbA1C: No results for input(s): HGBA1C in the last 72 hours. CBG: Recent Labs  Lab 11/14/19 0802 11/14/19 1148  GLUCAP 142* 203*   Lipid Profile: No results for input(s): CHOL, HDL, LDLCALC, TRIG, CHOLHDL, LDLDIRECT in the last 72 hours. Thyroid Function Tests: No results for input(s): TSH, T4TOTAL, FREET4, T3FREE, THYROIDAB in the last 72 hours. Anemia Panel: No results for input(s): VITAMINB12, FOLATE, FERRITIN, TIBC, IRON, RETICCTPCT in the last 72 hours. Sepsis Labs: No results  for input(s): PROCALCITON, LATICACIDVEN in the last 168 hours.  Recent Results (from the past 240 hour(s))  SARS Coronavirus 2 by RT PCR (hospital order, performed in [facility] hospital lab) Nasopharyngeal Nasopharyngeal Swab     Status: None   Collection Time: 11/20/19  6:27 PM   Specimen: Nasopharyngeal Swab  Result Value Ref Range Status   SARS Coronavirus 2 NEGATIVE NEGATIVE Final    Comment: (NOTE) SARS-CoV-2 target nucleic acids are NOT DETECTED. The SARS-CoV-2 RNA is generally detectable in upper and lower respiratory specimens during the acute phase of infection. The lowest concentration of SARS-CoV-2 viral copies this assay can detect is 250 copies / mL. A negative result does not preclude SARS-CoV-2 infection and should not be used as the sole basis for treatment or other patient management decisions.  A negative result may occur with improper specimen collection / handling, submission of specimen other than nasopharyngeal swab, presence of viral mutation(s) within the areas targeted by this assay, and inadequate number of viral copies (<250 copies / mL). A negative result must be combined with clinical observations, patient history, and epidemiological information. Fact Sheet for Patients:   StrictlyIdeas.no Fact Sheet for Healthcare  Providers: BankingDealers.co.za This test is not yet approved or cleared  by the [place] FDA and has been authorized for detection and/or diagnosis of SARS-CoV-2 by FDA under an Emergency Use Authorization (EUA).  This EUA will remain in effect (meaning this test can be used) for the duration of the COVID-19 declaration under Section 564(b)(1) of the Act, 21 U.S.C. section 360bbb-3(b)(1), unless the authorization is terminated or revoked sooner. Performed at [facility], [address] [place], [place] [address]          Radiology Studies: No results found.      Scheduled Meds: .  stroke: mapping our early stages of recovery book   Does not apply Once  . amLODipine  5 mg Oral Daily  . vitamin C  250 mg Oral Daily  . aspirin  81 mg Oral Daily  . cholecalciferol  1,000 Units Oral Daily  . clopidogrel  75 mg Oral Daily  . enoxaparin (LOVENOX) injection  40 mg Subcutaneous Q24H  . feeding supplement (ENSURE ENLIVE)  237 mL Oral TID BM  . hydrALAZINE  50 mg Oral Q6H  . loratadine  10 mg Oral Daily  . melatonin  5 mg Oral QHS  . metoprolol tartrate  25 mg Oral BID  . multivitamin with minerals  1 tablet Oral Daily  . olopatadine  1 drop Both Eyes BID  . omega-3 acid ethyl esters  1 g Oral Daily  . tamsulosin  0.4 mg Oral Daily   Continuous Infusions:   LOS: 12 days    Time spent: 31 mins     Wyvonnia Dusky, MD [facility] Pager [phone]  If 7PM-7AM, please contact night-coverage [email or web address] 11/21/2019, 7:39 AM

## 2019-11-22 ENCOUNTER — Encounter: Payer: Self-pay | Admitting: Internal Medicine

## 2019-11-22 LAB — CBC
HCT: 43.6 % (ref 39.0–52.0)
Hemoglobin: 15 g/dL (ref 13.0–17.0)
MCH: 28.2 pg (ref 26.0–34.0)
MCHC: 34.4 g/dL (ref 30.0–36.0)
MCV: 82.1 fL (ref 80.0–100.0)
Platelets: 261 10*3/uL (ref 150–400)
RBC: 5.31 MIL/uL (ref 4.22–5.81)
RDW: 13.5 % (ref 11.5–15.5)
WBC: 9.6 10*3/uL (ref 4.0–10.5)
nRBC: 0 % (ref 0.0–0.2)

## 2019-11-22 LAB — BASIC METABOLIC PANEL
Anion gap: 7 (ref 5–15)
BUN: 32 mg/dL — ABNORMAL HIGH (ref 8–23)
CO2: 27 mmol/L (ref 22–32)
Calcium: 8.8 mg/dL — ABNORMAL LOW (ref 8.9–10.3)
Chloride: 103 mmol/L (ref 98–111)
Creatinine, Ser: 1 mg/dL (ref 0.61–1.24)
GFR calc Af Amer: >60 mL/min (ref >60)
GFR calc non Af Amer: >60 mL/min (ref >60)
Glucose, Bld: 130 mg/dL — ABNORMAL HIGH (ref 70–99)
Potassium: 4 mmol/L (ref 3.5–5.1)
Sodium: 137 mmol/L (ref 135–145)

## 2019-11-22 NOTE — TOC Progression Note (Signed)
Transition of Care (TOC) - Progression Note    Patient Details  Name: Ehren Berisha. MRN: [id number] Date of Birth: Oct 14, 1940  Transition of Care [facility]) CM/SW Contact  Shelbie Hutching, RN Phone Number: 11/22/2019, 2:23 PM  Clinical Narrative:    [facility] notified this RNCM that we finally have insurance authorization but they have no bed available today.   Possible DC tomorrow if bed available.    Expected Discharge Plan: [place] Barriers to Discharge: Insurance Authorization(and waiting on bed offer)  Expected Discharge Plan and Services Expected Discharge Plan: [place]   Discharge Planning Services: CM Consult Post Acute Care Choice: [place] Living arrangements for the past 2 months: Single Family Home                                       Social Determinants of Health (SDOH) Interventions    Readmission Risk Interventions No flowsheet data found.

## 2019-11-22 NOTE — Progress Notes (Signed)
Progress Note    Francis Sims.  JOI:[id number] DOB: February 05, 1941  DOA: 11/09/2019 PCP: Cassandria Anger, MD      Brief Narrative:    Medical records reviewed and are as summarized below:  Francis Sims. is a 79 y.o. male       Assessment/Plan:   Principal Problem:   Acute ischemic right MCA stroke ([place]) Active Problems:   PERIPHERAL VASCULAR DISEASE   Dyslipidemia   Situational mixed anxiety and depressive disorder   Accelerated hypertension   Stroke (cerebrum) (HCC)   Acute ischemic right MCA CVA: CTA head and neck showed severe stenosis at right P1-P2 junction, no other significant stenosis. Echo shows EF of 60-35% with no wall motion abnormality and LVH. Neurology recommends dual antiplatelet therapy with aspirin & plavix x 3 weeks followed by plavix monotherapy daily. Pt intolerant to statin. PT/OT recommends CIR. Pt's insurance denied CIR but is ok w/ SNF. Awaiting discharge to SNF pending insurance authorization.  Acute hypoxia: resolved  Hypertension: continue antihypertensives  Anxiety and depressive disorder: Ativan prn.  BPH: continue flomax    Body mass index is 25.04 kg/m.   Family Communication/Anticipated D/C date and plan/Code Status   DVT prophylaxis: Lovenox Code Status: Full code Family Communication: Plan discussed with patient Disposition Plan:    Status is: Inpatient  Remains inpatient appropriate because:Unsafe d/c plan   Dispo: The patient is from: Home              Anticipated d/c is to: SNF              Anticipated d/c date is: 1 day              Patient currently is medically stable to d/c.           Subjective:   No complaints.  He feels better.  He is just frustrated by the delay in insurance authorization for discharge to SNF.  Objective:    Vitals:   11/22/19 0349 11/22/19 0803 11/22/19 1152 11/22/19 1153  BP: (!) 148/97 124/62 (!) 148/78 (!) 148/78  Pulse: 88 62  65  Resp: 17 14   16   Temp: 97.9 F (36.6 C) 98.1 F (36.7 C)  98.2 F (36.8 C)  TempSrc:  Oral  Oral  SpO2: 97% 95%  95%  Weight:      Height:       No data found.   Intake/Output Summary (Last 24 hours) at 11/22/2019 1550 Last data filed at 11/22/2019 0400 Gross per 24 hour  Intake -  Output 600 ml  Net -600 ml   Filed Weights   11/09/19 0950  Weight: 88.5 kg    Exam:  GEN: NAD SKIN: No rash EYES: EOMI ENT: MMM CV: RRR PULM: CTA B ABD: soft, ND, NT, +BS CNS: AAO x 3, non no gross focal deficits noted EXT: No edema or tenderness   Data Reviewed:   I have personally reviewed following labs and imaging studies:  Labs: Labs show the following:   Basic Metabolic Panel: Recent Labs  Lab 11/17/19 0525 11/17/19 0525 11/18/19 0649 11/18/19 0649 11/19/19 0450 11/19/19 0450 11/20/19 0520 11/22/19 0533  NA 136  --  136  --  133*  --  136 137  K 4.4   < > 4.0   < > 4.3   < > 4.7 4.0  CL 100  --  98  --  98  --  100 103  CO2 29  --  28  --  26  --  29 27  GLUCOSE 151*  --  180*  --  218*  --  132* 130*  BUN 27*  --  31*  --  33*  --  29* 32*  CREATININE 1.05  --  0.94  --  0.81  --  1.21 1.00  CALCIUM 8.9  --  9.0  --  9.1  --  9.1 8.8*   < > = values in this interval not displayed.   GFR Estimated Creatinine Clearance: 73.1 mL/min (by C-G formula based on SCr of 1 mg/dL). Liver Function Tests: No results for input(s): AST, ALT, ALKPHOS, BILITOT, PROT, ALBUMIN in the last 168 hours. No results for input(s): LIPASE, AMYLASE in the last 168 hours. No results for input(s): AMMONIA in the last 168 hours. Coagulation profile No results for input(s): INR, PROTIME in the last 168 hours.  CBC: Recent Labs  Lab 11/17/19 0525 11/18/19 0649 11/19/19 0450 11/20/19 0520 11/22/19 0533  WBC 10.3 9.0 10.7* 10.3 9.6  HGB 15.3 15.4 14.9 15.2 15.0  HCT 44.7 45.3 43.5 45.3 43.6  MCV 82.6 83.3 83.3 84.2 82.1  PLT 262 266 275 268 261   Cardiac Enzymes: No results for input(s):  CKTOTAL, CKMB, CKMBINDEX, TROPONINI in the last 168 hours. BNP (last 3 results) No results for input(s): PROBNP in the last 8760 hours. CBG: No results for input(s): GLUCAP in the last 168 hours. D-Dimer: No results for input(s): DDIMER in the last 72 hours. Hgb A1c: No results for input(s): HGBA1C in the last 72 hours. Lipid Profile: No results for input(s): CHOL, HDL, LDLCALC, TRIG, CHOLHDL, LDLDIRECT in the last 72 hours. Thyroid function studies: No results for input(s): TSH, T4TOTAL, T3FREE, THYROIDAB in the last 72 hours.  Invalid input(s): FREET3 Anemia work up: No results for input(s): VITAMINB12, FOLATE, FERRITIN, TIBC, IRON, RETICCTPCT in the last 72 hours. Sepsis Labs: Recent Labs  Lab 11/18/19 0649 11/19/19 0450 11/20/19 0520 11/22/19 0533  WBC 9.0 10.7* 10.3 9.6    Microbiology Recent Results (from the past 240 hour(s))  SARS Coronavirus 2 by RT PCR (hospital order, performed in [facility] hospital lab) Nasopharyngeal Nasopharyngeal Swab     Status: None   Collection Time: 11/20/19  6:27 PM   Specimen: Nasopharyngeal Swab  Result Value Ref Range Status   SARS Coronavirus 2 NEGATIVE NEGATIVE Final    Comment: (NOTE) SARS-CoV-2 target nucleic acids are NOT DETECTED. The SARS-CoV-2 RNA is generally detectable in upper and lower respiratory specimens during the acute phase of infection. The lowest concentration of SARS-CoV-2 viral copies this assay can detect is 250 copies / mL. A negative result does not preclude SARS-CoV-2 infection and should not be used as the sole basis for treatment or other patient management decisions.  A negative result may occur with improper specimen collection / handling, submission of specimen other than nasopharyngeal swab, presence of viral mutation(s) within the areas targeted by this assay, and inadequate number of viral copies (<250 copies / mL). A negative result must be combined with clinical observations, patient history,  and epidemiological information. Fact Sheet for Patients:   StrictlyIdeas.no Fact Sheet for Healthcare Providers: BankingDealers.co.za This test is not yet approved or cleared  by the [place] FDA and has been authorized for detection and/or diagnosis of SARS-CoV-2 by FDA under an Emergency Use Authorization (EUA).  This EUA will remain in effect (meaning this test can be used) for the duration of  the COVID-19 declaration under Section 564(b)(1) of the Act, 21 U.S.C. section 360bbb-3(b)(1), unless the authorization is terminated or revoked sooner. Performed at [facility], [address] [place], [place] [address]     Procedures and diagnostic studies:  No results found.  Medications:   .  stroke: mapping our early stages of recovery book   Does not apply Once  . amLODipine  5 mg Oral Daily  . vitamin C  250 mg Oral Daily  . aspirin  81 mg Oral Daily  . cholecalciferol  1,000 Units Oral Daily  . clopidogrel  75 mg Oral Daily  . enoxaparin (LOVENOX) injection  40 mg Subcutaneous Q24H  . feeding supplement (ENSURE ENLIVE)  237 mL Oral TID BM  . hydrALAZINE  50 mg Oral Q6H  . loratadine  10 mg Oral Daily  . melatonin  5 mg Oral QHS  . metoprolol tartrate  25 mg Oral BID  . multivitamin with minerals  1 tablet Oral Daily  . olopatadine  1 drop Both Eyes BID  . omega-3 acid ethyl esters  1 g Oral Daily  . tamsulosin  0.4 mg Oral Daily   Continuous Infusions:   LOS: 13 days   Jerusalem Wert  [facility]     11/22/2019, 3:50 PM

## 2019-11-22 NOTE — Progress Notes (Signed)
Physical Therapy Treatment Patient Details Name: Francis Sims. MRN: [id number] DOB: 06/01/1941 Today's Date: 11/22/2019    History of Present Illness presented to ER secondary to acute onset of L UE weakness/numbness; admitted for TIA/CVA work -up.  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    PT Comments    Pt was supine in bed upon arriving. He agrees to PT session and is cooperative and pleasant throughout. Pt is very motivated and states," I'm ready to get to rehab to get to work."  Pt is continuing to await insurance authorization to go. He was able to perform log roll technique to exit L side of bed. increased time required + min assist. Pt is unsteady in sitting but once sitting balance achieved, pt was able to maintain with CGA for safety. He stood from low bed height with CGA however required rocking technique and moderate vcs for proper hand placement and improved fwd wt shift. RW used throughout for safety. Pt did not use AD prior to admission. He continues to present with balance/coordination/ and strength deficit. Pt was able to tolerate ambulation 300 ft with RW + CGA. No LOB but moderate vcs for improved gait kinematics. Pt continues to present with coordination deficits on LUE/LLE. No knee buckling present today. Pt is very fatigued after gait training. Once returned to room, pt was repositioned in recliner when breakfast tray arrived. Overall pt continues to progress towards PT goals and continues to demonstrate return in LLE/LUE but deficits still present. He will benefit form SNF at DC to address deficits and return to PLOF. Pt was in recliner with chair alarm in place, call bell in reach, and pt resting comfortably.    Follow Up Recommendations  SNF     Equipment Recommendations  None recommended by PT    Recommendations for Other Services       Precautions / Restrictions Precautions Precautions: Fall Precaution Comments: watch  orthostatics Restrictions Weight Bearing Restrictions: No    Mobility  Bed Mobility Overal bed mobility: Needs Assistance Bed Mobility: Rolling;Supine to Sit;Sit to Supine Rolling: Modified independent (Device/Increase time) Sidelying to sit: Min assist Supine to sit: Min assist     General bed mobility comments: pt was able to roll L without assistance but does require min assist to achieve EOB short sit.   Transfers Overall transfer level: Needs assistance Equipment used: Rolling walker (2 wheeled) Transfers: Sit to/from Stand Sit to Stand: Min guard         General transfer comment: CGA for safety with VCs for fwd wt shift and improved technique.   Ambulation/Gait Ambulation/Gait assistance: Min guard Gait Distance (Feet): 300 Feet Assistive device: Rolling walker (2 wheeled) Gait Pattern/deviations: Step-through pattern;Decreased stance time - left;Decreased step length - right;Decreased stride length;Staggering left;Trunk flexed Gait velocity: decreased   General Gait Details: pt was able to tolerate ambulation 2 laps in hallway with CGA.   Stairs             Wheelchair Mobility    Modified Rankin (Stroke Patients Only)       Balance Overall balance assessment: Needs assistance Sitting-balance support: Feet supported;No upper extremity supported Sitting balance-Leahy Scale: Fair Sitting balance - Comments: pt did have L lateral lean upon initial sitting up EOB   Standing balance support: During functional activity;Bilateral upper extremity supported;No upper extremity supported;Single extremity supported Standing balance-Leahy Scale: Fair Standing balance comment: pt demonstartes fair standing balance with BUE support on RW.  Cognition Arousal/Alertness: Awake/alert Behavior During Therapy: WFL for tasks assessed/performed Overall Cognitive Status: Within Functional Limits for tasks assessed                                  General Comments: Pt A and O x 4, motivated and eager to return to PLOF      Exercises      General Comments        Pertinent Vitals/Pain Pain Assessment: No/denies pain Pain Score: 0-No pain Pain Location: LBP Pain Descriptors / Indicators: Sore Pain Intervention(s): Limited activity within patient's tolerance;Monitored during session;Premedicated before session;Repositioned    Home Living                      Prior Function            PT Goals (current goals can now be found in the care plan section) Acute Rehab PT Goals Patient Stated Goal: to get to rehab and regain independence  Progress towards PT goals: Progressing toward goals    Frequency    7X/week      PT Plan Current plan remains appropriate    Co-evaluation              AM-PAC PT "6 Clicks" Mobility   Outcome Measure  Help needed turning from your back to your side while in a flat bed without using bedrails?: None Help needed moving from lying on your back to sitting on the side of a flat bed without using bedrails?: None Help needed moving to and from a bed to a chair (including a wheelchair)?: A Little Help needed standing up from a chair using your arms (e.g., wheelchair or bedside chair)?: A Little Help needed to walk in hospital room?: A Little Help needed climbing 3-5 steps with a railing? : A Lot 6 Click Score: 19    End of Session Equipment Utilized During Treatment: Gait belt Activity Tolerance: Patient tolerated treatment well Patient left: in chair;with call bell/phone within reach;with chair alarm set Nurse Communication: Mobility status PT Visit Diagnosis: Difficulty in walking, not elsewhere classified (R26.2);Hemiplegia and hemiparesis Hemiplegia - Right/Left: Left Hemiplegia - dominant/non-dominant: Non-dominant Hemiplegia - caused by: Cerebral infarction     Time: 1610-9604 PT Time Calculation (min) (ACUTE ONLY): 26  min  Charges:  $Gait Training: 8-22 mins $Neuromuscular Re-education: 8-22 mins                     Julaine Fusi PTA 11/22/19, 12:23 PM

## 2019-11-23 DIAGNOSIS — R5381 Other malaise: Secondary | ICD-10-CM | POA: Diagnosis not present

## 2019-11-23 DIAGNOSIS — R262 Difficulty in walking, not elsewhere classified: Secondary | ICD-10-CM | POA: Diagnosis not present

## 2019-11-23 DIAGNOSIS — M6281 Muscle weakness (generalized): Secondary | ICD-10-CM | POA: Diagnosis not present

## 2019-11-23 DIAGNOSIS — D649 Anemia, unspecified: Secondary | ICD-10-CM | POA: Diagnosis not present

## 2019-11-23 DIAGNOSIS — G8929 Other chronic pain: Secondary | ICD-10-CM | POA: Diagnosis not present

## 2019-11-23 DIAGNOSIS — Z7401 Bed confinement status: Secondary | ICD-10-CM | POA: Diagnosis not present

## 2019-11-23 DIAGNOSIS — K2101 Gastro-esophageal reflux disease with esophagitis, with bleeding: Secondary | ICD-10-CM | POA: Diagnosis not present

## 2019-11-23 DIAGNOSIS — E785 Hyperlipidemia, unspecified: Secondary | ICD-10-CM | POA: Diagnosis not present

## 2019-11-23 DIAGNOSIS — K5900 Constipation, unspecified: Secondary | ICD-10-CM | POA: Diagnosis not present

## 2019-11-23 DIAGNOSIS — H401131 Primary open-angle glaucoma, bilateral, mild stage: Secondary | ICD-10-CM | POA: Diagnosis not present

## 2019-11-23 DIAGNOSIS — M2550 Pain in unspecified joint: Secondary | ICD-10-CM | POA: Diagnosis not present

## 2019-11-23 DIAGNOSIS — I10 Essential (primary) hypertension: Secondary | ICD-10-CM | POA: Diagnosis not present

## 2019-11-23 DIAGNOSIS — G463 Brain stem stroke syndrome: Secondary | ICD-10-CM | POA: Diagnosis not present

## 2019-11-23 DIAGNOSIS — I739 Peripheral vascular disease, unspecified: Secondary | ICD-10-CM | POA: Diagnosis not present

## 2019-11-23 DIAGNOSIS — I69354 Hemiplegia and hemiparesis following cerebral infarction affecting left non-dominant side: Secondary | ICD-10-CM | POA: Diagnosis not present

## 2019-11-23 DIAGNOSIS — R69 Illness, unspecified: Secondary | ICD-10-CM | POA: Diagnosis not present

## 2019-11-23 LAB — CREATININE, SERUM
Creatinine, Ser: 1.09 mg/dL (ref 0.61–1.24)
GFR calc Af Amer: >60 mL/min (ref >60)
GFR calc non Af Amer: >60 mL/min (ref >60)

## 2019-11-23 MED ORDER — ASPIRIN EC 81 MG PO TBEC: 81.0000 mg | DELAYED_RELEASE_TABLET | Freq: Every day | ORAL | 0 refills | Status: AC

## 2019-11-23 MED ORDER — METOPROLOL TARTRATE 25 MG PO TABS: 25.0000 mg | ORAL_TABLET | Freq: Two times a day (BID) | ORAL | Status: DC

## 2019-11-23 MED ORDER — LORAZEPAM 0.5 MG PO TABS: 0.5000 mg | ORAL_TABLET | Freq: Two times a day (BID) | ORAL | 0 refills | Status: DC | PRN

## 2019-11-23 MED ORDER — CLOPIDOGREL BISULFATE 75 MG PO TABS: 75.0000 mg | ORAL_TABLET | Freq: Every day | ORAL | Status: DC

## 2019-11-23 NOTE — Progress Notes (Signed)
Telephone report given to Maudie Mercury, LPN at [facility].

## 2019-11-23 NOTE — TOC Transition Note (Signed)
Transition of Care [facility]) - CM/SW Discharge Note   Patient Details  Name: Pharoah Goggins. MRN: [id number] Date of Birth: 07/13/1940  [facility] of [facility]) CM/SW Contact:  Shelbie Hutching, RN Phone Number: 11/23/2019, 12:45 PM   Clinical Narrative:     Patient is medically clear for discharge to skilled nursing for rehab.  Patient will discharge to [facility].  [facility] has approved authorization for rehab.  Patient will go to room 610, bedside RN will call report to [phone].  This RNCM will arrange EMS transport.   Final next level of care: Skilled Nursing Facility Barriers to Discharge: Barriers Resolved   Patient Goals and CMS Choice   CMS Medicare.gov Compare Post Acute Care list provided to:: Patient Represenative (must comment) Choice offered to / list presented to : Spouse  Discharge Placement PASRR number recieved: 11/21/19            Patient chooses bed at: [facility] [place] Patient to be transferred to facility by: [facility] Name of family member notified: Katharine Look - wife Patient and family notified of of transfer: 11/23/19  Discharge Plan and Services   Discharge Planning Services: CM Consult Post Acute Care Choice: [place]                               Social Determinants of Health (SDOH) Interventions     Readmission Risk Interventions No flowsheet data found.

## 2019-11-23 NOTE — Care Management Important Message (Signed)
Important Message  Patient Details  Name: Francis Sims. MRN: [id number] Date of Birth: [birth year]   Medicare Important Message Given:  Yes     Dannette Barbara 11/23/2019, 1:08 PM

## 2019-11-23 NOTE — TOC Progression Note (Signed)
Transition of Care (TOC) - Progression Note    Patient Details  Name: Francis Sims. MRN: [id number] Date of Birth: 07/15/1940  Transition of Care [facility]) CM/SW Contact  Shelbie Hutching, RN Phone Number: 11/23/2019, 2:15 PM  Clinical Narrative:     [facility] has been arranged- patient is 2nd on the list for pickup.   Expected Discharge Plan: [place] Barriers to Discharge: Barriers Resolved  Expected Discharge Plan and Services Expected Discharge Plan: [place]   Discharge Planning Services: CM Consult Post Acute Care Choice: [place] Living arrangements for the past 2 months: Single Family Home Expected Discharge Date: 11/23/19                                     Social Determinants of Health (SDOH) Interventions    Readmission Risk Interventions No flowsheet data found.

## 2019-11-23 NOTE — Discharge Summary (Signed)
Physician Discharge Summary  Francis Sims. YNW:[id number] DOB: 06-Jan-1941 DOA: 11/09/2019  PCP: Cassandria Anger, MD  Admit date: 11/09/2019 Discharge date: 11/23/2019  Discharge disposition: Skilled nursing facility   Recommendations for Outpatient Follow-Up:      Discharge Diagnosis:   Principal Problem:   Acute ischemic right MCA stroke [facility]) Active Problems:   PERIPHERAL VASCULAR DISEASE   Dyslipidemia   Situational mixed anxiety and depressive disorder   Accelerated hypertension   Stroke (cerebrum) ([place])    Discharge Condition: Stable.  Diet recommendation: Low-salt diet  Code status: Full code    Hospital Course:   Francis Sims. is a 79 y.o. male with medical history significant of GERD, situational mixed anxiety and depression, hypertension, BPH,  prediabetes (A1c of 6.3 based on last lab) who presented to the ED with 1 day history of left-sided weakness and numbness.  He also complained of unsteady gait and dizziness.  Reportedly, he had had about 4 falls at home within the past 6 months. Work-up in the hospital showed acute ischemic right MCA stroke.  He was evaluated by the neurologist who recommended dual antiplatelet therapy with aspirin and Plavix for 3 weeks followed by Plavix monotherapy.  Patient is intolerant to statins.  He was evaluated by PT and OT who recommended further rehabilitation at the inpatient rehab center ([facility]).  However, his insurance company denied discharge to CIR.  This caused prolonged stay in the hospital.  Ultimately, his insurance company authorized discharge to the skilled nursing facility.  His condition has improved and he is deemed stable for discharge to SNF today.    Discharge Exam:   Vitals:   11/23/19 0300 11/23/19 0741  BP: 107/63 135/75  Pulse: 61 69  Resp: 15 17  Temp: (!) 97.4 F (36.3 C) 98.3 F (36.8 C)  SpO2: 96% 94%   Vitals:   11/22/19 2016 11/22/19 2356 11/23/19 0300 11/23/19 0741    BP: 121/64 (!) 98/54 107/63 135/75  Pulse: 71 (!) 56 61 69  Resp: 16 16 15 17   Temp: 98.2 F (36.8 C) 98.2 F (36.8 C) (!) 97.4 F (36.3 C) 98.3 F (36.8 C)  TempSrc: Oral Oral Oral Oral  SpO2: 95% 93% 96% 94%  Weight:      Height:         GEN: NAD SKIN: No rash EYES: EOMI ENT: MMM CV: RRR PULM: CTA B ABD: soft, ND, NT, +BS CNS: AAO x 3, non focal EXT: No edema or tenderness   The results of significant diagnostics from this hospitalization (including imaging, microbiology, ancillary and laboratory) are listed below for reference.     Procedures and Diagnostic Studies:   ECHOCARDIOGRAM COMPLETE  Result Date: 11/10/2019    ECHOCARDIOGRAM REPORT   Patient Name:   Francis Sims. Date of Exam: 11/10/2019 Medical Rec #:  [id number]           Height:       74.0 in Accession #:    [id number]          Weight:       195.0 lb Date of Birth:  1941-03-03           BSA:          2.150 m Patient Age:    51 years            BP:           195/106 mmHg Patient Gender: M  HR:           74 bpm. Exam Location:  [facility] Procedure: 2D Echo, Cardiac Doppler and Color Doppler Indications:     Stroke 434.91  History:         Patient has no prior history of Echocardiogram examinations.                  Risk Factors:Hypertension.  Sonographer:     Sherrie Sport RDCS (AE) Referring Phys:  [id number] Diagnosing Phys: Ida Rogue MD  Sonographer Comments: Suboptimal parasternal window and no apical window. IMPRESSIONS  1. Left ventricular ejection fraction, by estimation, is 60 to 65%. The left ventricle has normal function. The left ventricle has no regional wall motion abnormalities. There is mild left ventricular hypertrophy. Left ventricular diastolic parameters are indeterminate.  2. Right ventricular systolic function is normal. The right ventricular size is normal. FINDINGS  Left Ventricle: Left ventricular ejection fraction, by estimation, is 60 to 65%. The left ventricle has  normal function. The left ventricle has no regional wall motion abnormalities. The left ventricular internal cavity size was normal in size. There is  mild left ventricular hypertrophy. Left ventricular diastolic parameters are indeterminate. Right Ventricle: The right ventricular size is normal. No increase in right ventricular wall thickness. Right ventricular systolic function is normal. Left Atrium: Left atrial size was normal in size. Right Atrium: Right atrial size was normal in size. Pericardium: There is no evidence of pericardial effusion. Mitral Valve: The mitral valve is normal in structure. Normal mobility of the mitral valve leaflets. No evidence of mitral valve regurgitation. No evidence of mitral valve stenosis. Tricuspid Valve: The tricuspid valve is normal in structure. Tricuspid valve regurgitation is not demonstrated. No evidence of tricuspid stenosis. Aortic Valve: The aortic valve was not well visualized. Aortic valve regurgitation is not visualized. Mild aortic valve sclerosis is present, with no evidence of aortic valve stenosis. Pulmonic Valve: The pulmonic valve was normal in structure. Pulmonic valve regurgitation is not visualized. No evidence of pulmonic stenosis. Aorta: The aortic root is normal in size and structure. Venous: The inferior vena cava is normal in size with greater than 50% respiratory variability, suggesting right atrial pressure of 3 mmHg. IAS/Shunts: No atrial level shunt detected by color flow Doppler.  LEFT VENTRICLE PLAX 2D LVIDd:         4.66 cm LVIDs:         3.02 cm LV PW:         1.26 cm LV IVS:        1.31 cm LVOT diam:     2.00 cm LVOT Area:     3.14 cm  LEFT ATRIUM         Index LA diam:    3.00 cm 1.40 cm/m                        PULMONIC VALVE AORTA                 PV Vmax:        0.99 m/s Ao Root diam: 3.00 cm PV Peak grad:   3.9 mmHg                       RVOT Peak grad: 2 mmHg   SHUNTS Systemic Diam: 2.00 cm Ida Rogue MD Electronically signed by  Ida Rogue MD Signature Date/Time: 11/10/2019/4:15:43 PM    Final  Labs:   Basic Metabolic Panel: Recent Labs  Lab 11/17/19 0525 11/17/19 0525 11/18/19 0347 11/18/19 4259 11/19/19 0450 11/19/19 0450 11/20/19 0520 11/22/19 0533 11/23/19 0554  NA 136  --  136  --  133*  --  136 137  --   K 4.4   < > 4.0   < > 4.3   < > 4.7 4.0  --   CL 100  --  98  --  98  --  100 103  --   CO2 29  --  28  --  26  --  29 27  --   GLUCOSE 151*  --  180*  --  218*  --  132* 130*  --   BUN 27*  --  31*  --  33*  --  29* 32*  --   CREATININE 1.05   < > 0.94  --  0.81  --  1.21 1.00 1.09  CALCIUM 8.9  --  9.0  --  9.1  --  9.1 8.8*  --    < > = values in this interval not displayed.   GFR Estimated Creatinine Clearance: 67 mL/min (by C-G formula based on SCr of 1.09 mg/dL). Liver Function Tests: No results for input(s): AST, ALT, ALKPHOS, BILITOT, PROT, ALBUMIN in the last 168 hours. No results for input(s): LIPASE, AMYLASE in the last 168 hours. No results for input(s): AMMONIA in the last 168 hours. Coagulation profile No results for input(s): INR, PROTIME in the last 168 hours.  CBC: Recent Labs  Lab 11/17/19 0525 11/18/19 0649 11/19/19 0450 11/20/19 0520 11/22/19 0533  WBC 10.3 9.0 10.7* 10.3 9.6  HGB 15.3 15.4 14.9 15.2 15.0  HCT 44.7 45.3 43.5 45.3 43.6  MCV 82.6 83.3 83.3 84.2 82.1  PLT 262 266 275 268 261   Cardiac Enzymes: No results for input(s): CKTOTAL, CKMB, CKMBINDEX, TROPONINI in the last 168 hours. BNP: Invalid input(s): POCBNP CBG: No results for input(s): GLUCAP in the last 168 hours. D-Dimer No results for input(s): DDIMER in the last 72 hours. Hgb A1c No results for input(s): HGBA1C in the last 72 hours. Lipid Profile No results for input(s): CHOL, HDL, LDLCALC, TRIG, CHOLHDL, LDLDIRECT in the last 72 hours. Thyroid function studies No results for input(s): TSH, T4TOTAL, T3FREE, THYROIDAB in the last 72 hours.  Invalid input(s): FREET3 Anemia work  up No results for input(s): VITAMINB12, FOLATE, FERRITIN, TIBC, IRON, RETICCTPCT in the last 72 hours. Microbiology Recent Results (from the past 240 hour(s))  SARS Coronavirus 2 by RT PCR (hospital order, performed in [facility] hospital lab) Nasopharyngeal Nasopharyngeal Swab     Status: None   Collection Time: 11/20/19  6:27 PM   Specimen: Nasopharyngeal Swab  Result Value Ref Range Status   SARS Coronavirus 2 NEGATIVE NEGATIVE Final    Comment: (NOTE) SARS-CoV-2 target nucleic acids are NOT DETECTED. The SARS-CoV-2 RNA is generally detectable in upper and lower respiratory specimens during the acute phase of infection. The lowest concentration of SARS-CoV-2 viral copies this assay can detect is 250 copies / mL. A negative result does not preclude SARS-CoV-2 infection and should not be used as the sole basis for treatment or other patient management decisions.  A negative result may occur with improper specimen collection / handling, submission of specimen other than nasopharyngeal swab, presence of viral mutation(s) within the areas targeted by this assay, and inadequate number of viral copies (<250 copies / mL). A negative result must be combined with  clinical observations, patient history, and epidemiological information. Fact Sheet for Patients:   StrictlyIdeas.no Fact Sheet for Healthcare Providers: BankingDealers.co.za This test is not yet approved or cleared  by the [place] FDA and has been authorized for detection and/or diagnosis of SARS-CoV-2 by FDA under an Emergency Use Authorization (EUA).  This EUA will remain in effect (meaning this test can be used) for the duration of the COVID-19 declaration under Section 564(b)(1) of the Act, 21 U.S.C. section 360bbb-3(b)(1), unless the authorization is terminated or revoked sooner. Performed at [facility], [address], [place], [place] [address]       Discharge Instructions:   Discharge Instructions    Diet - low sodium heart healthy   Complete by: As directed    Increase activity slowly   Complete by: As directed      Allergies as of 11/23/2019      Reactions   Penicillins Hives, Swelling   Sulfonamide Derivatives Other (See Comments)   Almost stopped heart in the service..Also, causes hives   Hydroxyzine    Too strong   Ibuprofen Itching   Statins    aches      Medication List    TAKE these medications   amLODipine 5 MG tablet Commonly known as: NORVASC Take 1 tablet (5 mg total) by mouth daily.   aspirin EC 81 MG tablet Take 1 tablet (81 mg total) by mouth daily for 7 days.   cetirizine 10 MG tablet Commonly known as: ZyrTEC Allergy Take 1 tablet (10 mg total) by mouth daily.   cholecalciferol 1000 units tablet Commonly known as: VITAMIN D Take 1,000 Units by mouth daily.   clopidogrel 75 MG tablet Commonly known as: PLAVIX Take 1 tablet (75 mg total) by mouth daily. Start taking on: November 24, 2019   Fish Oil 1000 MG Caps Take 1 capsule by mouth 2 (two) times daily.   LORazepam 0.5 MG tablet Commonly known as: ATIVAN Take 1 tablet (0.5 mg total) by mouth 2 (two) times daily as needed for anxiety.   metoprolol tartrate 25 MG tablet Commonly known as: LOPRESSOR Take 1 tablet (25 mg total) by mouth 2 (two) times daily.   nortriptyline 10 MG capsule Commonly known as: Pamelor Take 1-2 capsules (10-20 mg total) by mouth at bedtime.   olopatadine 0.1 % ophthalmic solution Commonly known as: PATANOL Place 1 drop into both eyes 2 (two) times daily.   omeprazole 40 MG capsule Commonly known as: PRILOSEC TAKE ONE CAPSULE BY MOUTH EVERY DAY AS NEEDED   tamsulosin 0.4 MG Caps capsule Commonly known as: FLOMAX Take 1 capsule (0.4 mg total) by mouth daily.   vardenafil 20 MG tablet Commonly known as: Levitra Take 1 tablet (20 mg total) by mouth daily as needed for erectile dysfunction.    vitamin C 250 MG tablet Commonly known as: ASCORBIC ACID Take 250 mg by mouth daily.       Contact information for after-discharge care    Destination    [facility] Preferred [facility]F .   Service: Skilled Nursing Contact information: [address] [place] [phone]                   Time coordinating discharge: 28 minutes  Signed:  Jennye Boroughs  [facility] 11/23/2019, 12:23 PM

## 2019-11-24 ENCOUNTER — Telehealth: Payer: Self-pay | Admitting: *Deleted

## 2019-11-24 NOTE — Telephone Encounter (Signed)
Pt was on TCM report admitted 11/09/19 w/acute ischemic right MCA stroke. Pt was having some left-sided weakness and numbness x's 1 day.  He also complained of unsteady gait and dizziness.  He was evaluated by the neurologist who recommended dual antiplatelet therapy with aspirin and Plavix for 3 weeks followed by Plavix monotherapy. Pt D/C 11/23/19 to SNF w/OT/PT.Marland KitchenJohny Chess

## 2019-11-28 DIAGNOSIS — I69354 Hemiplegia and hemiparesis following cerebral infarction affecting left non-dominant side: Secondary | ICD-10-CM | POA: Diagnosis not present

## 2019-11-28 DIAGNOSIS — E785 Hyperlipidemia, unspecified: Secondary | ICD-10-CM | POA: Diagnosis not present

## 2019-11-28 DIAGNOSIS — I10 Essential (primary) hypertension: Secondary | ICD-10-CM | POA: Diagnosis not present

## 2019-11-28 DIAGNOSIS — K5900 Constipation, unspecified: Secondary | ICD-10-CM | POA: Diagnosis not present

## 2019-12-04 ENCOUNTER — Other Ambulatory Visit: Payer: Self-pay | Admitting: Internal Medicine

## 2019-12-05 ENCOUNTER — Other Ambulatory Visit: Payer: Self-pay

## 2019-12-05 ENCOUNTER — Encounter: Payer: Self-pay | Admitting: Internal Medicine

## 2019-12-05 ENCOUNTER — Ambulatory Visit (INDEPENDENT_AMBULATORY_CARE_PROVIDER_SITE_OTHER): Payer: Medicare HMO | Admitting: Internal Medicine

## 2019-12-05 DIAGNOSIS — R634 Abnormal weight loss: Secondary | ICD-10-CM

## 2019-12-05 DIAGNOSIS — R0683 Snoring: Secondary | ICD-10-CM

## 2019-12-05 DIAGNOSIS — I10 Essential (primary) hypertension: Secondary | ICD-10-CM | POA: Diagnosis not present

## 2019-12-05 DIAGNOSIS — I63511 Cerebral infarction due to unspecified occlusion or stenosis of right middle cerebral artery: Secondary | ICD-10-CM | POA: Diagnosis not present

## 2019-12-05 MED ORDER — METOPROLOL TARTRATE 25 MG PO TABS: 25.0000 mg | ORAL_TABLET | Freq: Two times a day (BID) | ORAL | 3 refills | Status: DC

## 2019-12-05 MED ORDER — AMLODIPINE BESYLATE 5 MG PO TABS: 5.0000 mg | ORAL_TABLET | Freq: Every day | ORAL | 3 refills | Status: DC

## 2019-12-05 MED ORDER — CLOPIDOGREL BISULFATE 75 MG PO TABS: 75.0000 mg | ORAL_TABLET | Freq: Every day | ORAL | 3 refills | Status: DC

## 2019-12-05 MED ORDER — LORAZEPAM 0.5 MG PO TABS: 0.5000 mg | ORAL_TABLET | Freq: Two times a day (BID) | ORAL | 2 refills | Status: DC | PRN

## 2019-12-05 NOTE — Assessment & Plan Note (Signed)
L hemiparesis - in PT Neurol ref

## 2019-12-05 NOTE — Patient Instructions (Signed)
Look up upright walkers for adults

## 2019-12-05 NOTE — Progress Notes (Signed)
Subjective:  Patient ID: Francis Ade., male    DOB: 1940/07/07  Age: 79 y.o. MRN: [id number]  CC: No chief complaint on file.   HPI Francis Sims. presents for a CVA, HTN  LLE strength is 20-40% better    Admit date: 11/09/2019 Discharge date: 11/23/2019  Discharge disposition: Skilled nursing facility   Recommendations for Outpatient Follow-Up:      Discharge Diagnosis:   Principal Problem:   Acute ischemic right MCA stroke [facility]) Active Problems:   PERIPHERAL VASCULAR DISEASE   Dyslipidemia   Situational mixed anxiety and depressive disorder   Accelerated hypertension   Stroke (cerebrum) ([place])    Discharge Condition: Stable.  Diet recommendation: Low-salt diet  Code status: Full code    Hospital Course:   Francis Simsis a 79 y.o.malewith medical history significant ofGERD, situational mixed anxiety and depression, hypertension, BPH,  prediabetes (A1c of 6.3 based on last lab) who presented to the ED with 1 day history of left-sided weakness and numbness.  He also complained of unsteady gait and dizziness.  Reportedly, he had had about 4 falls at home within the past 6 months. Work-up in the hospital showed acute ischemic right MCA stroke.  He was evaluated by the neurologist who recommended dual antiplatelet therapy with aspirin and Plavix for 3 weeks followed by Plavix monotherapy.  Patient is intolerant to statins.  He was evaluated by PT and OT who recommended further rehabilitation at the inpatient rehab center ([facility]).  However, his insurance company denied discharge to CIR.  This caused prolonged stay in the hospital.  Ultimately, his insurance company authorized discharge to the skilled nursing facility.  His condition has improved and he is deemed stable for discharge to SNF today.    Discharge Exam:       Vitals:   11/23/19 0300 11/23/19 0741  BP: 107/63 135/75  Pulse: 61 69  Resp: 15 17  Temp: (!)  97.4 F (36.3 C) 98.3 F (36.8 C)  SpO2: 96% 94%         Vitals:   11/22/19 2016 11/22/19 2356 11/23/19 0300 11/23/19 0741  BP: 121/64 (!) 98/54 107/63 135/75  Pulse: 71 (!) 56 61 69  Resp: 16 16 15 17   Temp: 98.2 F (36.8 C) 98.2 F (36.8 C) (!) 97.4 F (36.3 C) 98.3 F (36.8 C)  TempSrc: Oral Oral Oral Oral  SpO2: 95% 93% 96% 94%  Weight:      Height:         GEN: NAD SKIN: No rash EYES: EOMI ENT: MMM CV: RRR PULM: CTA B ABD: soft, ND, NT, +BS CNS: AAO x 3, non focal EXT: No edema or tenderness   The results of significant diagnostics from this hospitalization (including imaging, microbiology, ancillary and laboratory) are listed below for reference.     Procedures and Diagnostic Studies:   ECHOCARDIOGRAM COMPLETE  Result Date: 11/10/2019    ECHOCARDIOGRAM REPORT   Patient Name:   Francis Sims. Date of Exam: 11/10/2019 Medical Rec #:  [id number]           Height:       74.0 in Accession #:    [id number]          Weight:       195.0 lb Date of Birth:  August 30, 1940           BSA:          2.150 m Patient Age:  79 years            BP:           195/106 mmHg Patient Gender: M                   HR:           74 bpm. Exam Location:  [facility] Procedure: 2D Echo, Cardiac Doppler and Color Doppler Indications:     Stroke 434.91  History:         Patient has no prior history of Echocardiogram examinations.                  Risk Factors:Hypertension.  Sonographer:     Sherrie Sport RDCS (AE) Referring Phys:  [id number] [facility] Diagnosing Phys: Ida Rogue MD  Sonographer Comments: Suboptimal parasternal window and no apical window. IMPRESSIONS  1. Left ventricular ejection fraction, by estimation, is 60 to 65%. The left ventricle has normal function. The left ventricle has no regional wall motion abnormalities. There is mild left ventricular hypertrophy. Left ventricular diastolic parameters are indeterminate.  2. Right ventricular systolic function is normal. The  right ventricular size is normal. FINDINGS  Left Ventricle: Left ventricular ejection fraction, by estimation, is 60 to 65%. The left ventricle has normal function. The left ventricle has no regional wall motion abnormalities. The left ventricular internal cavity size was normal in size. There is  mild left ventricular hypertrophy. Left ventricular diastolic parameters are indeterminate. Right Ventricle: The right ventricular size is normal. No increase in right ventricular wall thickness. Right ventricular systolic function is normal. Left Atrium: Left atrial size was normal in size. Right Atrium: Right atrial size was normal in size. Pericardium: There is no evidence of pericardial effusion. Mitral Valve: The mitral valve is normal in structure. Normal mobility of the mitral valve leaflets. No evidence of mitral valve regurgitation. No evidence of mitral valve stenosis. Tricuspid Valve: The tricuspid valve is normal in structure. Tricuspid valve regurgitation is not demonstrated. No evidence of tricuspid stenosis. Aortic Valve: The aortic valve was not well visualized. Aortic valve regurgitation is not visualized. Mild aortic valve sclerosis is present, with no evidence of aortic valve stenosis. Pulmonic Valve: The pulmonic valve was normal in structure. Pulmonic valve regurgitation is not visualized. No evidence of pulmonic stenosis. Aorta: The aortic root is normal in size and structure. Venous: The inferior vena cava is normal in size with greater than 50% respiratory variability, suggesting right atrial pressure of 3 mmHg. IAS/Shunts: No atrial level shunt detected by color flow Doppler.  LEFT VENTRICLE PLAX 2D LVIDd:         4.66 cm LVIDs:         3.02 cm LV PW:         1.26 cm LV IVS:        1.31 cm LVOT diam:     2.00 cm LVOT Area:     3.14 cm  LEFT ATRIUM         Index LA diam:    3.00 cm 1.40 cm/m                        PULMONIC VALVE AORTA                 PV Vmax:        0.99 m/s Ao Root diam: 3.00 cm  PV Peak grad:   3.9 mmHg  RVOT Peak grad: 2 mmHg   SHUNTS Systemic Diam: 2.00 cm Ida Rogue MD Electronically signed by Ida Rogue MD Signature Date/Time: 11/10/2019/4:15:43 PM    Final     Outpatient Medications Prior to Visit  Medication Sig Dispense Refill  . amLODipine (NORVASC) 5 MG tablet Take 1 tablet (5 mg total) by mouth daily. 90 tablet 3  . cholecalciferol (VITAMIN D) 1000 UNITS tablet Take 1,000 Units by mouth daily.      . clopidogrel (PLAVIX) 75 MG tablet Take 1 tablet (75 mg total) by mouth daily.    Marland Kitchen LORazepam (ATIVAN) 0.5 MG tablet Take 1 tablet (0.5 mg total) by mouth 2 (two) times daily as needed for anxiety. 10 tablet 0  . metoprolol tartrate (LOPRESSOR) 25 MG tablet Take 1 tablet (25 mg total) by mouth 2 (two) times daily.    . nortriptyline (PAMELOR) 10 MG capsule Take 1-2 capsules (10-20 mg total) by mouth at bedtime. 180 capsule 1  . Omega-3 Fatty Acids (FISH OIL) 1000 MG CAPS Take 1 capsule by mouth 2 (two) times daily.      Marland Kitchen omeprazole (PRILOSEC) 40 MG capsule TAKE ONE CAPSULE BY MOUTH EVERY DAY AS NEEDED (Patient taking differently: Take 40 mg by mouth daily as needed. ) 90 capsule 3  . tamsulosin (FLOMAX) 0.4 MG CAPS capsule TAKE 1 CAPSULE BY MOUTH EVERY DAY 90 capsule 1  . vardenafil (LEVITRA) 20 MG tablet Take 1 tablet (20 mg total) by mouth daily as needed for erectile dysfunction. 90 tablet 1  . vitamin C (ASCORBIC ACID) 250 MG tablet Take 250 mg by mouth daily.    . cetirizine (ZYRTEC ALLERGY) 10 MG tablet Take 1 tablet (10 mg total) by mouth daily. 30 tablet 11   No facility-administered medications prior to visit.    ROS: Review of Systems  Constitutional: Positive for fatigue and unexpected weight change. Negative for appetite change.  HENT: Negative for congestion, nosebleeds, sneezing, sore throat and trouble swallowing.   Eyes: Negative for itching and visual disturbance.  Respiratory: Negative for cough.     Cardiovascular: Negative for chest pain, palpitations and leg swelling.  Gastrointestinal: Negative for abdominal distention, blood in stool, diarrhea and nausea.  Genitourinary: Negative for frequency and hematuria.  Musculoskeletal: Positive for gait problem. Negative for back pain, joint swelling and neck pain.  Skin: Negative for rash.  Neurological: Positive for weakness. Negative for dizziness, tremors and speech difficulty.  Psychiatric/Behavioral: Negative for agitation, dysphoric mood, sleep disturbance and suicidal ideas. The patient is not nervous/anxious.     Objective:  BP (!) 190/90 (BP Location: Left Arm, Patient Position: Sitting, Cuff Size: Normal)   Pulse (!) 49   Temp 97.9 F (36.6 C) (Oral)   Ht 6\' 2"  (1.88 m)   Wt 195 lb (88.5 kg)   SpO2 96%   BMI 25.04 kg/m   BP Readings from Last 3 Encounters:  12/05/19 (!) 190/90  11/23/19 135/75  09/27/19 (!) 154/82    Wt Readings from Last 3 Encounters:  12/05/19 195 lb (88.5 kg)  11/09/19 195 lb (88.5 kg)  09/27/19 196 lb (88.9 kg)    Physical Exam Constitutional:      General: He is not in acute distress.    Appearance: He is well-developed.     Comments: NAD  Eyes:     Conjunctiva/sclera: Conjunctivae normal.     Pupils: Pupils are equal, round, and reactive to light.  Neck:     Thyroid: No thyromegaly.  Vascular: No JVD.  Cardiovascular:     Rate and Rhythm: Normal rate and regular rhythm.     Heart sounds: Normal heart sounds. No murmur heard.  No friction rub. No gallop.   Pulmonary:     Effort: Pulmonary effort is normal. No respiratory distress.     Breath sounds: Normal breath sounds. No wheezing or rales.  Chest:     Chest wall: No tenderness.  Abdominal:     General: Bowel sounds are normal. There is no distension.     Palpations: Abdomen is soft. There is no mass.     Tenderness: There is no abdominal tenderness. There is no guarding or rebound.  Musculoskeletal:        General: No  tenderness. Normal range of motion.     Cervical back: Normal range of motion.  Lymphadenopathy:     Cervical: No cervical adenopathy.  Skin:    General: Skin is warm and dry.     Findings: No rash.  Neurological:     Mental Status: He is alert and oriented to person, place, and time.     Cranial Nerves: No cranial nerve deficit.     Motor: Weakness present. No abnormal muscle tone.     Coordination: Coordination abnormal.     Gait: Gait abnormal.     Deep Tendon Reflexes: Reflexes are normal and symmetric.  Psychiatric:        Behavior: Behavior normal.        Thought Content: Thought content normal.        Judgment: Judgment normal.    LLE weak Walker   Lab Results  Component Value Date   WBC 9.6 11/22/2019   HGB 15.0 11/22/2019   HCT 43.6 11/22/2019   PLT 261 11/22/2019   GLUCOSE 130 (H) 11/22/2019   CHOL 185 11/10/2019   TRIG 78 11/10/2019   HDL 34 (L) 11/10/2019   LDLDIRECT 105.0 05/31/2019   LDLCALC 135 (H) 11/10/2019   ALT 24 11/09/2019   AST 26 11/09/2019   NA 137 11/22/2019   K 4.0 11/22/2019   CL 103 11/22/2019   CREATININE 1.09 11/23/2019   BUN 32 (H) 11/22/2019   CO2 27 11/22/2019   TSH 1.21 05/31/2019   PSA 0.30 05/31/2019   INR 1.0 11/09/2019   HGBA1C 6.7 (H) 11/09/2019    ECHOCARDIOGRAM COMPLETE  Result Date: 11/10/2019    ECHOCARDIOGRAM REPORT   Patient Name:   Francis Sims. Date of Exam: 11/10/2019 Medical Rec #:  [id number]           Height:       74.0 in Accession #:    [id number]          Weight:       195.0 lb Date of Birth:  [birth year]           BSA:          2.150 m Patient Age:    77 years            BP:           195/106 mmHg Patient Gender: M                   HR:           74 bpm. Exam Location:  [facility] Procedure: 2D Echo, Cardiac Doppler and Color Doppler Indications:     Stroke 434.91  History:         Patient has no prior  history of Echocardiogram examinations.                  Risk Factors:Hypertension.  Sonographer:     Sherrie Sport  RDCS (AE) Referring Phys:  [id number] [facility] Diagnosing Phys: Ida Rogue MD  Sonographer Comments: Suboptimal parasternal window and no apical window. IMPRESSIONS  1. Left ventricular ejection fraction, by estimation, is 60 to 65%. The left ventricle has normal function. The left ventricle has no regional wall motion abnormalities. There is mild left ventricular hypertrophy. Left ventricular diastolic parameters are indeterminate.  2. Right ventricular systolic function is normal. The right ventricular size is normal. FINDINGS  Left Ventricle: Left ventricular ejection fraction, by estimation, is 60 to 65%. The left ventricle has normal function. The left ventricle has no regional wall motion abnormalities. The left ventricular internal cavity size was normal in size. There is  mild left ventricular hypertrophy. Left ventricular diastolic parameters are indeterminate. Right Ventricle: The right ventricular size is normal. No increase in right ventricular wall thickness. Right ventricular systolic function is normal. Left Atrium: Left atrial size was normal in size. Right Atrium: Right atrial size was normal in size. Pericardium: There is no evidence of pericardial effusion. Mitral Valve: The mitral valve is normal in structure. Normal mobility of the mitral valve leaflets. No evidence of mitral valve regurgitation. No evidence of mitral valve stenosis. Tricuspid Valve: The tricuspid valve is normal in structure. Tricuspid valve regurgitation is not demonstrated. No evidence of tricuspid stenosis. Aortic Valve: The aortic valve was not well visualized. Aortic valve regurgitation is not visualized. Mild aortic valve sclerosis is present, with no evidence of aortic valve stenosis. Pulmonic Valve: The pulmonic valve was normal in structure. Pulmonic valve regurgitation is not visualized. No evidence of pulmonic stenosis. Aorta: The aortic root is normal in size and structure. Venous: The inferior vena cava is  normal in size with greater than 50% respiratory variability, suggesting right atrial pressure of 3 mmHg. IAS/Shunts: No atrial level shunt detected by color flow Doppler.  LEFT VENTRICLE PLAX 2D LVIDd:         4.66 cm LVIDs:         3.02 cm LV PW:         1.26 cm LV IVS:        1.31 cm LVOT diam:     2.00 cm LVOT Area:     3.14 cm  LEFT ATRIUM         Index LA diam:    3.00 cm 1.40 cm/m                        PULMONIC VALVE AORTA                 PV Vmax:        0.99 m/s Ao Root diam: 3.00 cm PV Peak grad:   3.9 mmHg                       RVOT Peak grad: 2 mmHg   SHUNTS Systemic Diam: 2.00 cm Ida Rogue MD Electronically signed by Ida Rogue MD Signature Date/Time: 11/10/2019/4:15:43 PM    Final     Assessment & Plan:    [name], MD

## 2019-12-08 DIAGNOSIS — I10 Essential (primary) hypertension: Secondary | ICD-10-CM | POA: Diagnosis not present

## 2019-12-08 DIAGNOSIS — E1151 Type 2 diabetes mellitus with diabetic peripheral angiopathy without gangrene: Secondary | ICD-10-CM | POA: Diagnosis not present

## 2019-12-08 DIAGNOSIS — N528 Other male erectile dysfunction: Secondary | ICD-10-CM | POA: Diagnosis not present

## 2019-12-08 DIAGNOSIS — N401 Enlarged prostate with lower urinary tract symptoms: Secondary | ICD-10-CM | POA: Diagnosis not present

## 2019-12-08 DIAGNOSIS — N39498 Other specified urinary incontinence: Secondary | ICD-10-CM | POA: Diagnosis not present

## 2019-12-08 DIAGNOSIS — H401131 Primary open-angle glaucoma, bilateral, mild stage: Secondary | ICD-10-CM | POA: Diagnosis not present

## 2019-12-08 DIAGNOSIS — R69 Illness, unspecified: Secondary | ICD-10-CM | POA: Diagnosis not present

## 2019-12-08 DIAGNOSIS — H16142 Punctate keratitis, left eye: Secondary | ICD-10-CM | POA: Diagnosis not present

## 2019-12-08 DIAGNOSIS — I69354 Hemiplegia and hemiparesis following cerebral infarction affecting left non-dominant side: Secondary | ICD-10-CM | POA: Diagnosis not present

## 2019-12-08 DIAGNOSIS — G8929 Other chronic pain: Secondary | ICD-10-CM | POA: Diagnosis not present

## 2019-12-11 DIAGNOSIS — D2272 Melanocytic nevi of left lower limb, including hip: Secondary | ICD-10-CM | POA: Diagnosis not present

## 2019-12-11 DIAGNOSIS — L821 Other seborrheic keratosis: Secondary | ICD-10-CM | POA: Diagnosis not present

## 2019-12-11 DIAGNOSIS — Z85828 Personal history of other malignant neoplasm of skin: Secondary | ICD-10-CM | POA: Diagnosis not present

## 2019-12-11 DIAGNOSIS — D225 Melanocytic nevi of trunk: Secondary | ICD-10-CM | POA: Diagnosis not present

## 2019-12-11 DIAGNOSIS — L570 Actinic keratosis: Secondary | ICD-10-CM | POA: Diagnosis not present

## 2019-12-13 ENCOUNTER — Other Ambulatory Visit: Payer: Self-pay | Admitting: Internal Medicine

## 2019-12-13 DIAGNOSIS — I63511 Cerebral infarction due to unspecified occlusion or stenosis of right middle cerebral artery: Secondary | ICD-10-CM

## 2019-12-14 DIAGNOSIS — F419 Anxiety disorder, unspecified: Secondary | ICD-10-CM

## 2019-12-14 DIAGNOSIS — H401131 Primary open-angle glaucoma, bilateral, mild stage: Secondary | ICD-10-CM

## 2019-12-14 DIAGNOSIS — J302 Other seasonal allergic rhinitis: Secondary | ICD-10-CM

## 2019-12-14 DIAGNOSIS — K5900 Constipation, unspecified: Secondary | ICD-10-CM

## 2019-12-14 DIAGNOSIS — N528 Other male erectile dysfunction: Secondary | ICD-10-CM

## 2019-12-14 DIAGNOSIS — N39498 Other specified urinary incontinence: Secondary | ICD-10-CM | POA: Diagnosis not present

## 2019-12-14 DIAGNOSIS — G8929 Other chronic pain: Secondary | ICD-10-CM | POA: Diagnosis not present

## 2019-12-14 DIAGNOSIS — Z87891 Personal history of nicotine dependence: Secondary | ICD-10-CM

## 2019-12-14 DIAGNOSIS — Z7902 Long term (current) use of antithrombotics/antiplatelets: Secondary | ICD-10-CM

## 2019-12-14 DIAGNOSIS — I69354 Hemiplegia and hemiparesis following cerebral infarction affecting left non-dominant side: Secondary | ICD-10-CM | POA: Diagnosis not present

## 2019-12-14 DIAGNOSIS — K2100 Gastro-esophageal reflux disease with esophagitis, without bleeding: Secondary | ICD-10-CM

## 2019-12-14 DIAGNOSIS — I10 Essential (primary) hypertension: Secondary | ICD-10-CM | POA: Diagnosis not present

## 2019-12-14 DIAGNOSIS — Z9181 History of falling: Secondary | ICD-10-CM

## 2019-12-14 DIAGNOSIS — R69 Illness, unspecified: Secondary | ICD-10-CM | POA: Diagnosis not present

## 2019-12-14 DIAGNOSIS — F3289 Other specified depressive episodes: Secondary | ICD-10-CM

## 2019-12-14 DIAGNOSIS — E785 Hyperlipidemia, unspecified: Secondary | ICD-10-CM

## 2019-12-14 DIAGNOSIS — E1151 Type 2 diabetes mellitus with diabetic peripheral angiopathy without gangrene: Secondary | ICD-10-CM | POA: Diagnosis not present

## 2019-12-14 DIAGNOSIS — E569 Vitamin deficiency, unspecified: Secondary | ICD-10-CM

## 2019-12-14 DIAGNOSIS — N401 Enlarged prostate with lower urinary tract symptoms: Secondary | ICD-10-CM

## 2019-12-15 DIAGNOSIS — H18529 Epithelial (juvenile) corneal dystrophy, unspecified eye: Secondary | ICD-10-CM | POA: Diagnosis not present

## 2019-12-20 ENCOUNTER — Ambulatory Visit: Payer: Medicare HMO | Admitting: Internal Medicine

## 2019-12-21 DIAGNOSIS — H18529 Epithelial (juvenile) corneal dystrophy, unspecified eye: Secondary | ICD-10-CM | POA: Diagnosis not present

## 2019-12-25 NOTE — Patient Instructions (Addendum)
Access Code: [email or web address] URL: [email or web address] Date: 12/26/2019 Prepared by: Roxana Hires  Exercises Heel Toe Raises with Counter Support as Needed - 2 x daily - 7 x weekly - 2 sets - 10 reps - 3s hold Mini Squat with Counter Support - 2 x daily - 7 x weekly - 2 sets - 10 reps Sit to Stand without Arm Support - 2 x daily - 7 x weekly - 2 sets - 10 reps Standing Romberg to 1/2 Tandem Stance - 2 x daily - 7 x weekly - 3 reps - 3 x 30 with each foot forward hold

## 2019-12-26 ENCOUNTER — Ambulatory Visit: Payer: Medicare HMO | Attending: Internal Medicine

## 2019-12-26 ENCOUNTER — Other Ambulatory Visit: Payer: Self-pay

## 2019-12-26 DIAGNOSIS — Z9181 History of falling: Secondary | ICD-10-CM | POA: Diagnosis not present

## 2019-12-26 DIAGNOSIS — R2681 Unsteadiness on feet: Secondary | ICD-10-CM | POA: Insufficient documentation

## 2019-12-26 DIAGNOSIS — M6281 Muscle weakness (generalized): Secondary | ICD-10-CM | POA: Diagnosis not present
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
## 2019-12-27 ENCOUNTER — Ambulatory Visit: Payer: Medicare HMO | Admitting: Internal Medicine

## 2020-01-01 ENCOUNTER — Other Ambulatory Visit: Payer: Self-pay

## 2020-01-01 ENCOUNTER — Ambulatory Visit: Payer: Medicare HMO

## 2020-01-01 DIAGNOSIS — Z9181 History of falling: Secondary | ICD-10-CM

## 2020-01-01 DIAGNOSIS — R2681 Unsteadiness on feet: Secondary | ICD-10-CM | POA: Diagnosis not present

## 2020-01-01 DIAGNOSIS — M6281 Muscle weakness (generalized): Secondary | ICD-10-CM
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
## 2020-01-01 NOTE — Patient Instructions (Signed)
Access Code: [email or web address] URL: [email or web address] Date: 01/01/2020 Prepared by: Roxana Hires  Exercises Heel Toe Raises with Counter Support as Needed - 2 x daily - 7 x weekly - 2 sets - 10 reps - 3s hold Mini Squat with Counter Support - 2 x daily - 7 x weekly - 2 sets - 10 reps Sit to Stand without Arm Support - 2 x daily - 7 x weekly - 2 sets - 10 reps Standing Romberg to 1/2 Tandem Stance - 2 x daily - 7 x weekly - 3 reps - 3 x 30 with each foot forward hold Seated Ankle Dorsiflexion with Anchored Resistance - 2 x daily - 7 x weekly - 2 sets - 10 reps - 3s hold

## 2020-01-04 ENCOUNTER — Other Ambulatory Visit: Payer: Self-pay

## 2020-01-04 ENCOUNTER — Ambulatory Visit: Payer: Medicare HMO

## 2020-01-04 DIAGNOSIS — R2681 Unsteadiness on feet: Secondary | ICD-10-CM | POA: Diagnosis not present

## 2020-01-04 DIAGNOSIS — Z9181 History of falling: Secondary | ICD-10-CM

## 2020-01-04 DIAGNOSIS — M6281 Muscle weakness (generalized): Secondary | ICD-10-CM | POA: Diagnosis not present
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
## 2020-01-08 ENCOUNTER — Other Ambulatory Visit: Payer: Self-pay

## 2020-01-08 ENCOUNTER — Ambulatory Visit: Payer: Medicare HMO

## 2020-01-08 DIAGNOSIS — Z9181 History of falling: Secondary | ICD-10-CM | POA: Diagnosis not present

## 2020-01-08 DIAGNOSIS — M6281 Muscle weakness (generalized): Secondary | ICD-10-CM | POA: Diagnosis not present

## 2020-01-08 DIAGNOSIS — R2681 Unsteadiness on feet: Secondary | ICD-10-CM | POA: Diagnosis not present
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
## 2020-01-11 ENCOUNTER — Encounter: Payer: Self-pay | Admitting: Physical Therapy

## 2020-01-11 ENCOUNTER — Ambulatory Visit: Payer: Medicare HMO | Admitting: Physical Therapy

## 2020-01-11 ENCOUNTER — Other Ambulatory Visit: Payer: Self-pay

## 2020-01-11 DIAGNOSIS — Z9181 History of falling: Secondary | ICD-10-CM | POA: Diagnosis not present

## 2020-01-11 DIAGNOSIS — M6281 Muscle weakness (generalized): Secondary | ICD-10-CM | POA: Diagnosis not present

## 2020-01-11 DIAGNOSIS — R2681 Unsteadiness on feet: Secondary | ICD-10-CM
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
## 2020-01-16 ENCOUNTER — Ambulatory Visit (INDEPENDENT_AMBULATORY_CARE_PROVIDER_SITE_OTHER): Payer: Medicare HMO | Admitting: Internal Medicine

## 2020-01-16 ENCOUNTER — Ambulatory Visit: Payer: Medicare HMO | Attending: Internal Medicine

## 2020-01-16 ENCOUNTER — Encounter: Payer: Self-pay | Admitting: Internal Medicine

## 2020-01-16 ENCOUNTER — Other Ambulatory Visit: Payer: Self-pay

## 2020-01-16 DIAGNOSIS — I63511 Cerebral infarction due to unspecified occlusion or stenosis of right middle cerebral artery: Secondary | ICD-10-CM | POA: Diagnosis not present

## 2020-01-16 DIAGNOSIS — M6281 Muscle weakness (generalized): Secondary | ICD-10-CM | POA: Diagnosis not present

## 2020-01-16 DIAGNOSIS — I10 Essential (primary) hypertension: Secondary | ICD-10-CM | POA: Diagnosis not present

## 2020-01-16 DIAGNOSIS — Z9181 History of falling: Secondary | ICD-10-CM | POA: Diagnosis not present

## 2020-01-16 DIAGNOSIS — R2681 Unsteadiness on feet: Secondary | ICD-10-CM

## 2020-01-16 MED ORDER — LOSARTAN POTASSIUM 50 MG PO TABS
50.0000 mg | ORAL_TABLET | Freq: Every day | ORAL | 3 refills | Status: DC
Start: 2020-01-16 — End: 2021-03-31

## 2020-01-16 NOTE — Assessment & Plan Note (Signed)
Better D/c Metoprolol Add Losartan if elevated BP Cont Norvasc

## 2020-01-16 NOTE — Patient Instructions (Addendum)
Try trekking poles  [employer] mushroom  BP Readings from Last 3 Encounters:  01/16/20 120/60  12/05/19 (!) 150/80  11/23/19 135/75
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
## 2020-01-16 NOTE — Assessment & Plan Note (Addendum)
L hemiparesis - in PT Try trekking poles Dr Brett Fairy next week [employer]

## 2020-01-16 NOTE — Progress Notes (Signed)
Subjective:  Patient ID: Francis Sims., male    DOB: 1940/12/10  Age: 79 y.o. MRN: [id number]  CC: No chief complaint on file.   HPI Francis Sims. presents for CVA, L hemiparesis, HTN f/u C/o fatigue - better w/less Metoprolol   Outpatient Medications Prior to Visit  Medication Sig Dispense Refill  . amLODipine (NORVASC) 5 MG tablet Take 1 tablet (5 mg total) by mouth daily. 90 tablet 3  . cetirizine (ZYRTEC ALLERGY) 10 MG tablet Take 1 tablet (10 mg total) by mouth daily. 30 tablet 11  . cholecalciferol (VITAMIN D) 1000 UNITS tablet Take 1,000 Units by mouth daily.      . clopidogrel (PLAVIX) 75 MG tablet Take 1 tablet (75 mg total) by mouth daily. 90 tablet 3  . LORazepam (ATIVAN) 0.5 MG tablet Take 1 tablet (0.5 mg total) by mouth 2 (two) times daily as needed for anxiety. 60 tablet 2  . metoprolol tartrate (LOPRESSOR) 25 MG tablet Take 1 tablet (25 mg total) by mouth 2 (two) times daily. 180 tablet 3  . nortriptyline (PAMELOR) 10 MG capsule Take 1-2 capsules (10-20 mg total) by mouth at bedtime. 180 capsule 1  . Omega-3 Fatty Acids (FISH OIL) 1000 MG CAPS Take 1 capsule by mouth 2 (two) times daily.      Marland Kitchen omeprazole (PRILOSEC) 40 MG capsule TAKE ONE CAPSULE BY MOUTH EVERY DAY AS NEEDED (Patient taking differently: Take 40 mg by mouth daily as needed. ) 90 capsule 3  . tamsulosin (FLOMAX) 0.4 MG CAPS capsule TAKE 1 CAPSULE BY MOUTH EVERY DAY 90 capsule 1  . vardenafil (LEVITRA) 20 MG tablet Take 1 tablet (20 mg total) by mouth daily as needed for erectile dysfunction. 90 tablet 1  . vitamin C (ASCORBIC ACID) 250 MG tablet Take 250 mg by mouth daily.     No facility-administered medications prior to visit.    ROS: Review of Systems  Constitutional: Positive for fatigue. Negative for appetite change and unexpected weight change.  HENT: Negative for congestion, nosebleeds, sneezing, sore throat and trouble swallowing.   Eyes: Negative for itching and visual  disturbance.  Respiratory: Negative for cough.   Cardiovascular: Negative for chest pain, palpitations and leg swelling.  Gastrointestinal: Negative for abdominal distention, blood in stool, diarrhea and nausea.  Genitourinary: Negative for frequency and hematuria.  Musculoskeletal: Negative for back pain, gait problem, joint swelling and neck pain.  Skin: Negative for rash.  Neurological: Positive for weakness. Negative for dizziness, tremors and speech difficulty.  Psychiatric/Behavioral: Negative for agitation, dysphoric mood and sleep disturbance. The patient is not nervous/anxious.     Objective:  BP 120/60 (BP Location: Left Arm, Patient Position: Sitting, Cuff Size: Large)   Pulse 68   Temp 98 F (36.7 C) (Oral)   Ht 6\' 2"  (1.88 m)   Wt 193 lb (87.5 kg)   SpO2 96%   BMI 24.78 kg/m   BP Readings from Last 3 Encounters:  01/16/20 120/60  12/05/19 (!) 150/80  11/23/19 135/75    Wt Readings from Last 3 Encounters:  01/16/20 193 lb (87.5 kg)  12/05/19 195 lb (88.5 kg)  11/09/19 195 lb (88.5 kg)    Physical Exam Constitutional:      General: He is not in acute distress.    Appearance: He is well-developed.     Comments: NAD  Eyes:     Conjunctiva/sclera: Conjunctivae normal.     Pupils: Pupils are equal, round, and reactive to light.  Neck:     Thyroid: No thyromegaly.     Vascular: No JVD.  Cardiovascular:     Rate and Rhythm: Normal rate and regular rhythm.     Heart sounds: Normal heart sounds. No murmur heard.  No friction rub. No gallop.   Pulmonary:     Effort: Pulmonary effort is normal. No respiratory distress.     Breath sounds: Normal breath sounds. No wheezing or rales.  Chest:     Chest wall: No tenderness.  Abdominal:     General: Bowel sounds are normal. There is no distension.     Palpations: Abdomen is soft. There is no mass.     Tenderness: There is no abdominal tenderness. There is no guarding or rebound.  Musculoskeletal:        General:  No tenderness. Normal range of motion.     Cervical back: Normal range of motion.  Lymphadenopathy:     Cervical: No cervical adenopathy.  Skin:    General: Skin is warm and dry.     Findings: No rash.  Neurological:     Mental Status: He is alert and oriented to person, place, and time.     Cranial Nerves: No cranial nerve deficit.     Motor: No abnormal muscle tone.     Coordination: Coordination normal.     Gait: Gait normal.     Deep Tendon Reflexes: Reflexes are normal and symmetric.  Psychiatric:        Behavior: Behavior normal.        Thought Content: Thought content normal.        Judgment: Judgment normal.   Cane   Lab Results  Component Value Date   WBC 9.6 11/22/2019   HGB 15.0 11/22/2019   HCT 43.6 11/22/2019   PLT 261 11/22/2019   GLUCOSE 130 (H) 11/22/2019   CHOL 185 11/10/2019   TRIG 78 11/10/2019   HDL 34 (L) 11/10/2019   LDLDIRECT 105.0 05/31/2019   LDLCALC 135 (H) 11/10/2019   ALT 24 11/09/2019   AST 26 11/09/2019   NA 137 11/22/2019   K 4.0 11/22/2019   CL 103 11/22/2019   CREATININE 1.09 11/23/2019   BUN 32 (H) 11/22/2019   CO2 27 11/22/2019   TSH 1.21 05/31/2019   PSA 0.30 05/31/2019   INR 1.0 11/09/2019   HGBA1C 6.7 (H) 11/09/2019    ECHOCARDIOGRAM COMPLETE  Result Date: 11/10/2019    ECHOCARDIOGRAM REPORT   Patient Name:   Francis Sims. Date of Exam: 11/10/2019 Medical Rec #:  [id number]           Height:       74.0 in Accession #:    [id number]          Weight:       195.0 lb Date of Birth:  [birth year]           BSA:          2.150 m Patient Age:    86 years            BP:           195/106 mmHg Patient Gender: M                   HR:           74 bpm. Exam Location:  [facility] Procedure: 2D Echo, Cardiac Doppler and Color Doppler Indications:     Stroke 434.91  History:  Patient has no prior history of Echocardiogram examinations.                  Risk Factors:Hypertension.  Sonographer:     Sherrie Sport RDCS (AE) Referring Phys:  [id number]  [facility] Diagnosing Phys: Ida Rogue MD  Sonographer Comments: Suboptimal parasternal window and no apical window. IMPRESSIONS  1. Left ventricular ejection fraction, by estimation, is 60 to 65%. The left ventricle has normal function. The left ventricle has no regional wall motion abnormalities. There is mild left ventricular hypertrophy. Left ventricular diastolic parameters are indeterminate.  2. Right ventricular systolic function is normal. The right ventricular size is normal. FINDINGS  Left Ventricle: Left ventricular ejection fraction, by estimation, is 60 to 65%. The left ventricle has normal function. The left ventricle has no regional wall motion abnormalities. The left ventricular internal cavity size was normal in size. There is  mild left ventricular hypertrophy. Left ventricular diastolic parameters are indeterminate. Right Ventricle: The right ventricular size is normal. No increase in right ventricular wall thickness. Right ventricular systolic function is normal. Left Atrium: Left atrial size was normal in size. Right Atrium: Right atrial size was normal in size. Pericardium: There is no evidence of pericardial effusion. Mitral Valve: The mitral valve is normal in structure. Normal mobility of the mitral valve leaflets. No evidence of mitral valve regurgitation. No evidence of mitral valve stenosis. Tricuspid Valve: The tricuspid valve is normal in structure. Tricuspid valve regurgitation is not demonstrated. No evidence of tricuspid stenosis. Aortic Valve: The aortic valve was not well visualized. Aortic valve regurgitation is not visualized. Mild aortic valve sclerosis is present, with no evidence of aortic valve stenosis. Pulmonic Valve: The pulmonic valve was normal in structure. Pulmonic valve regurgitation is not visualized. No evidence of pulmonic stenosis. Aorta: The aortic root is normal in size and structure. Venous: The inferior vena cava is normal in size with greater than  50% respiratory variability, suggesting right atrial pressure of 3 mmHg. IAS/Shunts: No atrial level shunt detected by color flow Doppler.  LEFT VENTRICLE PLAX 2D LVIDd:         4.66 cm LVIDs:         3.02 cm LV PW:         1.26 cm LV IVS:        1.31 cm LVOT diam:     2.00 cm LVOT Area:     3.14 cm  LEFT ATRIUM         Index LA diam:    3.00 cm 1.40 cm/m                        PULMONIC VALVE AORTA                 PV Vmax:        0.99 m/s Ao Root diam: 3.00 cm PV Peak grad:   3.9 mmHg                       RVOT Peak grad: 2 mmHg   SHUNTS Systemic Diam: 2.00 cm Ida Rogue MD Electronically signed by Ida Rogue MD Signature Date/Time: 11/10/2019/4:15:43 PM    Final     Assessment & Plan:   There are no diagnoses linked to this encounter.   No orders of the defined types were placed in this encounter.    Follow-up: No follow-ups on file.  [name], MD

## 2020-01-18 ENCOUNTER — Ambulatory Visit: Payer: Medicare HMO | Admitting: Physical Therapy

## 2020-01-18 ENCOUNTER — Other Ambulatory Visit: Payer: Self-pay

## 2020-01-18 ENCOUNTER — Encounter: Payer: Self-pay | Admitting: Physical Therapy

## 2020-01-18 DIAGNOSIS — R2681 Unsteadiness on feet: Secondary | ICD-10-CM

## 2020-01-18 DIAGNOSIS — Z9181 History of falling: Secondary | ICD-10-CM

## 2020-01-18 DIAGNOSIS — M6281 Muscle weakness (generalized): Secondary | ICD-10-CM | POA: Diagnosis not present
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
## 2020-01-22 ENCOUNTER — Encounter: Payer: Self-pay | Admitting: Neurology

## 2020-01-22 ENCOUNTER — Ambulatory Visit: Payer: Medicare HMO | Admitting: Neurology

## 2020-01-22 VITALS — BP 132/78 | HR 71 | Ht 74.0 in | Wt 195.0 lb

## 2020-01-22 DIAGNOSIS — R2681 Unsteadiness on feet: Secondary | ICD-10-CM

## 2020-01-22 DIAGNOSIS — I6359 Cerebral infarction due to unspecified occlusion or stenosis of other cerebral artery: Secondary | ICD-10-CM | POA: Diagnosis not present

## 2020-01-22 DIAGNOSIS — I6523 Occlusion and stenosis of bilateral carotid arteries: Secondary | ICD-10-CM | POA: Diagnosis not present

## 2020-01-22 DIAGNOSIS — I63329 Cerebral infarction due to thrombosis of unspecified anterior cerebral artery: Secondary | ICD-10-CM

## 2020-01-22 NOTE — Patient Instructions (Signed)

## 2020-01-22 NOTE — Progress Notes (Signed)
SLEEP MEDICINE CLINIC    Provider:  Larey Seat, MD  Primary Care Physician:  Cassandria Anger, MD [place] [address]     Referring Provider: Cassandria Anger, [place],  [place] [address]          Chief Complaint according to patient   Patient presents with:    . New Patient (Initial Visit)           HISTORY OF PRESENT ILLNESS:  Francis Sims. is a 79 y.o. year old White or Caucasian male patient seen here as a referral on 01/22/2020 from PCP for a sleep consulation. .  Chief concern according to patient : " I am just different since the stroke- I am more tired, fatigued"   I have the pleasure of seeing Francis Sims. today, a right-handed White or Caucasian male with a recent CVA and resultant or preexisting sleep disorder.   He  has a  medical history of CVA, May 2021, resulting left leg, foot and hand numbness and weakness. Hypersomnia. Burping, Chronic back pain, Dry skin, GERD (gastroesophageal reflux disease), Hemorrhoids, colonic polyps, Hypertension, Nocturia, and PONV (postoperative nausea and vomiting).     Sleep relevant medical history: DOAdmission at [facility]: 11/09/2019 PCP: Cassandria Anger, MD  Brief History   79 year old male with history of GERD, situational mixed anxiety and depression, elevated blood pressure, prediabetes and BPH presented to the ED with 1 day history of left-sided weakness and numbness.  Reportedly has had 4 episodes of falls in the past 6 months without loss of consciousness or any injury. MRI done in the ED showed acute small infarct in the posterior limb of the right internal capsule.  Patient outside the window for TPA and did not receive TPA.  Admitted for further management.  As the patient is outside the 24-38 hour window, hydralazine has been ordered q8hr to address blood pressures. Echocardiogram demonstrates 60-65% ejection fraction with normal LV function. There are no  regional wall motion abnormalities. There is mild left ventricular hypertrophy. LV diastolic parameters are indeterminate. RV systolic function and size are normal. There is no atrial level shunt detected by color flow doppler. CTA head and neck has demonstrated severe and multifocal stenosis along the right posterior cerebral artery including severe stenosis near the right P1-P2 junction. CTA neck demonstrated less than 50% stenosis of the right ICA with a small area of ulcerated plaque at the common carotid bifurcation. There is also additional mild atherosclerotic irregularity along the remainder of the cervical ICA. Lt carotid system demonstrated an eccentric non-calcified plaque along the common carotid causing approximately 50% stenosis. Vertebral arteries are patent.  Neurology has recommended statin for lipid management, PT/OT/SLP eval, dual antiplatelet treatment for 3 weeks with change from ASA 81 mg daily and Plavix 75 mg daily to Plavix only after 3 weeks, and target BP less than 140/80. Unfortunately the patient is intolerant of statins. The patient was to follow up outpatient at discharge.      Family medical /sleep history: no  other family member with CVA, on CPAP with OSA, insomnia, or sleep walkers.   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  Patient is retired from [employer]. and lives in a household with spouse. The couple has an adult daughter in [place], step daughter, and lost a son to suicide 06/2018. Wife had a severe MVA and was diagnosed with cancer, is  now in chemo.  Pets are not present. Tobacco use: 1972 quit-.   ETOH use; 2/ month ,  Caffeine intake in form of Coffee( caffeine free) Soda( some caffeine free) Tea ( 3/ week) and no energy drinks. Regular exercise in form of Tennis.     Sleep habits are as follows: The patient's dinner time is between 6 PM. The patient goes to bed at 10 PM and watches Tv all night- wife has  moved on to a separate bedroom years ago- now he falls asleep easier since the stroke. and continues to sleep for 2-3 hours, wakes for 2-3 bathroom breaks, the first time at 1 AM.   The preferred sleep position is sideways- but he injured his hip and has now been a supine sleeper or sleeps in a recliner ( takes naps there, too).  , with the support of 1 pillow.  Dreams are reportedly infrequent..  6.30AM is the usual rise time. The patient wakes up spontaneously.  He reports  feeling refreshed and restored in AM, he needs a while to wake up since the CVA- but his  wife stated differently- he is not alert, not oriented.  He endorsed  symptoms such as dry mouth, morning headaches, and residual fatigue.  Naps are taken very frequently now , lasting from 20-50  minutes and are more refreshing than nocturnal sleep.    Review of Systems: Out of a complete 14 system review, the patient complains of only the following symptoms, and all other reviewed systems are negative.:  Fatigue, sleepiness , snoring, fragmented sleep, Nocturia.   clumsiness left side, and gait impaired.  How likely are you to doze in the following situations: 0 = not likely, 1 = slight chance, 2 = moderate chance, 3 = high chance   Sitting and Reading? Watching Television? Sitting inactive in a public place (theater or meeting)? As a passenger in a car for an hour without a break? Lying down in the afternoon when circumstances permit? Sitting and talking to someone? Sitting quietly after lunch without alcohol? In a car, while stopped for a few minutes in traffic?   Total = 12/ 24 points   FSS endorsed at 48/ 63 points.   GDS 4/ 15.  Social History   Socioeconomic History  . Marital status: Married    Spouse name: Not on file  . Number of children: Not on file  . Years of education: Not on file  . Highest education level: Not on file  Occupational History  . Occupation: retired Hydrologist  Tobacco Use  . Smoking status:  Former Research scientist (life sciences)  . Smokeless tobacco: Never Used  . Tobacco comment: in the 60's quit  Substance and Sexual Activity  . Alcohol use: No    Alcohol/week: 0.0 standard drinks  . Drug use: No  . Sexual activity: Yes  Other Topics Concern  . Not on file  Social History Narrative   Regular  Exercise-yes: tennis, work outs   Social Determinants of Radio broadcast assistant Strain:   . Difficulty of Paying Living Expenses:   Food Insecurity:   . Worried About Charity fundraiser in the Last Year:   . Arboriculturist in the Last Year:   Transportation Needs:   . Film/video editor (Medical):   Marland Kitchen Lack of Transportation (Non-Medical):   Physical Activity:   . Days of Exercise per Week:   . Minutes of Exercise per Session:   Stress:   . Feeling of  Stress :   Social Connections:   . Frequency of Communication with Friends and Family:   . Frequency of Social Gatherings with Friends and Family:   . Attends Religious Services:   . Active Member of Clubs or Organizations:   . Attends Archivist Meetings:   Marland Kitchen Marital Status:     Family History  Problem Relation Age of Onset  . COPD Other   . Anesthesia problems Neg Hx   . Hypotension Neg Hx   . Malignant hyperthermia Neg Hx   . Pseudochol deficiency Neg Hx     Past Medical History:  Diagnosis Date  . Burping    per pt lots of burping  . Chronic back pain    pinched nerve;buldging disc  . Dry skin    on elbows;uses vasaline and it goes away  . GERD (gastroesophageal reflux disease)    OTC  . Hemorrhoids   . Hx of colonic polyps    66yrs ago  . Hypertension   . Nocturia   . PONV (postoperative nausea and vomiting)     Past Surgical History:  Procedure Laterality Date  . BACK SURGERY    . BROW LIFT Right 07/12/2013   Procedure: CONTRACTURE  RELEASE ZPLASTY OF RIGHT EYE, PERIORBITAL AREA WITH REPAIR OF PTOSIS OF RIGHT EYE BROW;  Surgeon: Theodoro Kos, DO;  Location: [facility];  Service:  Plastics;  Laterality: Right;  . CATARACT EXTRACTION  2008   bilateral  . CERVICAL FUSION  2000   C5,6,7  . COLONOSCOPY    . EYE SURGERY    . HERNIA REPAIR  1949  . LUMBAR LAMINECTOMY/DECOMPRESSION MICRODISCECTOMY  05/26/2011   Procedure: LUMBAR LAMINECTOMY/DECOMPRESSION MICRODISCECTOMY;  Surgeon: Olga Coaster Kritzer;  Location: [facility];  Service: Neurosurgery;  Laterality: Right;  Right Lumbar three-four Microdiskectomy  . TONSILLECTOMY       Current Outpatient Medications on File Prior to Visit  Medication Sig Dispense Refill  . amLODipine (NORVASC) 5 MG tablet Take 1 tablet (5 mg total) by mouth daily. 90 tablet 3  . cholecalciferol (VITAMIN D) 1000 UNITS tablet Take 1,000 Units by mouth daily.      . clopidogrel (PLAVIX) 75 MG tablet Take 1 tablet (75 mg total) by mouth daily. 90 tablet 3  . LORazepam (ATIVAN) 0.5 MG tablet Take 1 tablet (0.5 mg total) by mouth 2 (two) times daily as needed for anxiety. 60 tablet 2  . losartan (COZAAR) 50 MG tablet Take 1 tablet (50 mg total) by mouth daily. 90 tablet 3  . nortriptyline (PAMELOR) 10 MG capsule Take 1-2 capsules (10-20 mg total) by mouth at bedtime. 180 capsule 1  . Omega-3 Fatty Acids (FISH OIL) 1000 MG CAPS Take 1 capsule by mouth 2 (two) times daily.      Marland Kitchen omeprazole (PRILOSEC) 40 MG capsule TAKE ONE CAPSULE BY MOUTH EVERY DAY AS NEEDED (Patient taking differently: Take 40 mg by mouth daily as needed. ) 90 capsule 3  . tamsulosin (FLOMAX) 0.4 MG CAPS capsule TAKE 1 CAPSULE BY MOUTH EVERY DAY 90 capsule 1  . vardenafil (LEVITRA) 20 MG tablet Take 1 tablet (20 mg total) by mouth daily as needed for erectile dysfunction. 90 tablet 1  . vitamin C (ASCORBIC ACID) 250 MG tablet Take 250 mg by mouth daily.    . cetirizine (ZYRTEC ALLERGY) 10 MG tablet Take 1 tablet (10 mg total) by mouth daily. 30 tablet 11   No current facility-administered medications on file prior to  visit.    Allergies  Allergen Reactions  . Penicillins Hives  and Swelling  . Sulfonamide Derivatives Other (See Comments)    Almost stopped heart in the service..Also, causes hives  . Metoprolol     fatigue  . Hydroxyzine     Too strong  . Ibuprofen Itching  . Statins     aches    Physical exam:  Today's Vitals   01/22/20 0859  BP: 132/78  Pulse: 71  Weight: 195 lb (88.5 kg)  Height: 6\' 2"  (1.88 m)   Body mass index is 25.04 kg/m.   Wt Readings from Last 3 Encounters:  01/22/20 195 lb (88.5 kg)  01/16/20 193 lb (87.5 kg)  12/05/19 195 lb (88.5 kg)     Ht Readings from Last 3 Encounters:  01/22/20 6\' 2"  (1.88 m)  01/16/20 6\' 2"  (1.88 m)  12/05/19 6\' 2"  (1.88 m)      General: The patient is awake, alert and appears not in acute distress. The patient is well groomed. He was 225 pounds at the time of his son's suciide followed by wife's cancer diagnosed. Now 193 pounds.  Head: Normocephalic, atraumatic. Neck is supple. Mallampati; 2,  neck circumference:16 inches . Nasal airflow patent.  Retrognathia is not  seen.  Dental status:  Cardiovascular:  Regular rate and cardiac rhythm by pulse,  without distendd neck veins. Respiratory: Lungs are clear to auscultation.  Skin:  Without evidence of ankle edema, or rash. Trunk: The patient's posture is erect.   Neurologic exam : The patient is awake and alert, oriented to place and time.   Memory subjective described as intact.  Attention span & concentration ability appears normal.  Speech is fluent,  with  dysarthria, with dysphonia or aphasia.  Mood and affect are appropriate.   Cranial nerves: no loss of smell or taste reported  Pupils are equal and briskly reactive to light. Funduscopic exam  deferred.  Extraocular movements in vertical and horizontal planes were intact and without nystagmus. No Diplopia. Visual fields by finger perimetry are intact. Hearing was intact to soft voice and finger rubbing.    Facial sensation intact to fine touch.  Facial motor strength is  symmetric and tongue elavated higher on his right-  and uvula move midline.  Neck ROM : rotation, tilt and flexion extension were normal for age and shoulder shrug was symmetrical.    Motor exam:  assymmetric bulk, tone and ROM.   left sided tone increased, no cog wheeling.  Normal tone without cog wheeling, symmetric grip strength .   Sensory:  Fine touch, pinprick and vibration were tested  and  normal.  Proprioception tested in the upper extremities was normal.   Coordination: Rapid alternating movements in the fingers/hands were of normal speed.  The Finger-to-nose maneuver was intact without evidence of ataxia, dysmetria or tremor.   Gait and station: Patient could rise unassisted from a seated position, walked without assistive device.  Stance is of normal width/ base and the patient turned with  steps.  Toe and heel walk were deferred.  Deep tendon reflexes: in the upper and lower extremities are symmetric and intact.  Babinski response was deferred .   [name] is a 25 so it 79 year old Caucasian gentleman with an unremarkable medical history in the past he lost quite a bit of weight in the year 2020 and felt fine until the evening of 11-08-18 when he started having weakness in his left leg and left forearm he also felt  numbish at the time he had sat down at the bed had not yet been asleep and as the symptoms seem to decrease he did go to sleep.  He woke up during the night and appeared back to baseline when he woke up on the morning of the 27th and had lasting deficits.  He felt dizzy unsteady on his feet and presented to evaluation at the time.  There were also several falls in the last 6 months prior to this.  He was diagnosed with a stroke affecting his left body Right brain stroke did not affect his course strength or tone but he became more clumsy and less fine motor skills.  His gait remained impaired he is now using a 3 pronged cane and he feels that his left body is just not  listening to what he wants to do.  An MRI was obtained on 27 May and followed by multiplanar CT images as well as carotid artery evaluations.  There was no acute bleed noticed a small infarct in the MRI was not visible there was no loss of gray-white matter differentiation small chronic infarct in the left ganglions were noticed.  Cervical spine was also evaluated there is postoperative changes noted he had a previous anterior fusion between C5 and C7.  Multilevel cervical spine degenerative changes were seen no and noncalcified plaque in the right petrous segment causing severe stenosis was assumed.  Basilar artery was patent small infarct was better visible on prior MRI.  After spending a total time of  45  minutes face to face and additional time for physical and neurologic examination, review of laboratory studies,  personal review of imaging studies, reports and results of other testing and review of referral information / records as far as provided in visit, I have established the following assessments:  1)  Right internal capsular stroke.  2)  history of snoring.  3)  CVA related hypersomnia , some cognitive decline, left sided clumsiness.   My Plan is to proceed with:  1) Sleep study to screen for sleep apnea.  HST or PSG both can be used.    I would like to thank Plotnikov, Evie Lacks, MD and Plotnikov, Evie Lacks, [address] [place],  [place] [address] for allowing me to meet with and to take care of this pleasant patient.   In short, Francis Sims. is presenting with sleep apnea risk factor-    I plan to follow up either personally or through our NP within 3 month.   CC: I will share my notes with PCP>   Electronically signed by: Larey Seat, MD 01/22/2020 9:14 AM  [facility] Board certified by The [employer] of Sleep Medicine and Diplomate of the [employer] of Sleep Medicine. Board certified In Neurology through the  [place], Fellow of the [employer] of Neurology. Medical Director of [facility].

## 2020-01-23 ENCOUNTER — Other Ambulatory Visit: Payer: Self-pay

## 2020-01-23 ENCOUNTER — Encounter: Payer: Self-pay | Admitting: Physical Therapy

## 2020-01-23 ENCOUNTER — Ambulatory Visit: Payer: Medicare HMO

## 2020-01-23 DIAGNOSIS — R2681 Unsteadiness on feet: Secondary | ICD-10-CM | POA: Diagnosis not present

## 2020-01-23 DIAGNOSIS — M6281 Muscle weakness (generalized): Secondary | ICD-10-CM

## 2020-01-23 DIAGNOSIS — Z9181 History of falling: Secondary | ICD-10-CM
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
## 2020-01-25 ENCOUNTER — Other Ambulatory Visit: Payer: Self-pay

## 2020-01-25 ENCOUNTER — Ambulatory Visit: Payer: Medicare HMO

## 2020-01-25 DIAGNOSIS — R2681 Unsteadiness on feet: Secondary | ICD-10-CM | POA: Diagnosis not present

## 2020-01-25 DIAGNOSIS — Z9181 History of falling: Secondary | ICD-10-CM | POA: Diagnosis not present

## 2020-01-25 DIAGNOSIS — M6281 Muscle weakness (generalized): Secondary | ICD-10-CM | POA: Diagnosis not present
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
## 2020-01-30 ENCOUNTER — Other Ambulatory Visit: Payer: Self-pay

## 2020-01-30 ENCOUNTER — Telehealth: Payer: Self-pay

## 2020-01-30 ENCOUNTER — Ambulatory Visit: Payer: Medicare HMO

## 2020-01-30 DIAGNOSIS — R2681 Unsteadiness on feet: Secondary | ICD-10-CM

## 2020-01-30 DIAGNOSIS — M6281 Muscle weakness (generalized): Secondary | ICD-10-CM | POA: Diagnosis not present

## 2020-01-30 DIAGNOSIS — Z9181 History of falling: Secondary | ICD-10-CM | POA: Diagnosis not present

## 2020-01-30 NOTE — Telephone Encounter (Signed)
LVM for pt to call me back to schedule sleep study  
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
## 2020-02-01 ENCOUNTER — Other Ambulatory Visit: Payer: Self-pay

## 2020-02-01 ENCOUNTER — Ambulatory Visit: Payer: Medicare HMO

## 2020-02-01 DIAGNOSIS — R2681 Unsteadiness on feet: Secondary | ICD-10-CM | POA: Diagnosis not present

## 2020-02-01 DIAGNOSIS — Z9181 History of falling: Secondary | ICD-10-CM

## 2020-02-01 DIAGNOSIS — M6281 Muscle weakness (generalized): Secondary | ICD-10-CM
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
## 2020-02-04 ENCOUNTER — Ambulatory Visit (INDEPENDENT_AMBULATORY_CARE_PROVIDER_SITE_OTHER): Payer: Medicare HMO | Admitting: Neurology

## 2020-02-04 DIAGNOSIS — R2681 Unsteadiness on feet: Secondary | ICD-10-CM

## 2020-02-04 DIAGNOSIS — G4710 Hypersomnia, unspecified: Secondary | ICD-10-CM

## 2020-02-04 DIAGNOSIS — R4189 Other symptoms and signs involving cognitive functions and awareness: Secondary | ICD-10-CM

## 2020-02-04 DIAGNOSIS — G4714 Hypersomnia due to medical condition: Secondary | ICD-10-CM

## 2020-02-04 DIAGNOSIS — I6359 Cerebral infarction due to unspecified occlusion or stenosis of other cerebral artery: Secondary | ICD-10-CM

## 2020-02-04 DIAGNOSIS — I6523 Occlusion and stenosis of bilateral carotid arteries: Secondary | ICD-10-CM

## 2020-02-04 DIAGNOSIS — I63329 Cerebral infarction due to thrombosis of unspecified anterior cerebral artery: Secondary | ICD-10-CM

## 2020-02-06 ENCOUNTER — Ambulatory Visit: Payer: Medicare HMO

## 2020-02-06 ENCOUNTER — Other Ambulatory Visit: Payer: Self-pay

## 2020-02-06 DIAGNOSIS — Z9181 History of falling: Secondary | ICD-10-CM | POA: Diagnosis not present

## 2020-02-06 DIAGNOSIS — R2681 Unsteadiness on feet: Secondary | ICD-10-CM | POA: Diagnosis not present

## 2020-02-06 DIAGNOSIS — M6281 Muscle weakness (generalized): Secondary | ICD-10-CM | POA: Diagnosis not present
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
## 2020-02-08 ENCOUNTER — Ambulatory Visit: Payer: Medicare HMO | Admitting: Physical Therapy

## 2020-02-13 ENCOUNTER — Ambulatory Visit: Payer: Medicare HMO

## 2020-02-13 ENCOUNTER — Other Ambulatory Visit: Payer: Self-pay

## 2020-02-13 ENCOUNTER — Telehealth: Payer: Self-pay | Admitting: Neurology

## 2020-02-13 DIAGNOSIS — Z9181 History of falling: Secondary | ICD-10-CM

## 2020-02-13 DIAGNOSIS — R4189 Other symptoms and signs involving cognitive functions and awareness: Secondary | ICD-10-CM | POA: Insufficient documentation

## 2020-02-13 DIAGNOSIS — G4714 Hypersomnia due to medical condition: Secondary | ICD-10-CM | POA: Insufficient documentation

## 2020-02-13 DIAGNOSIS — M6281 Muscle weakness (generalized): Secondary | ICD-10-CM | POA: Diagnosis not present

## 2020-02-13 DIAGNOSIS — R2681 Unsteadiness on feet: Secondary | ICD-10-CM

## 2020-02-13 DIAGNOSIS — I639 Cerebral infarction, unspecified: Secondary | ICD-10-CM | POA: Insufficient documentation

## 2020-02-13 DIAGNOSIS — I63329 Cerebral infarction due to thrombosis of unspecified anterior cerebral artery: Secondary | ICD-10-CM | POA: Insufficient documentation

## 2020-02-13 NOTE — Procedures (Signed)
PATIENTS NAME:  Francis Sims, Francis Sims DOB:      02-08-1941      MR#:    [id number]     DATE OF RECORDING: 02/04/2020 REFERRING M.D.:  Lew Dawes, MD Study Performed:   Baseline Polysomnogram for Stroke patient.  HISTORY:  79 year old male stroke patient with medical history of GERD, situational mixed anxiety and depression-treatment with benzodiazepines, elevated blood pressure, prediabetes and BPH presented to the ED with 1 day history of left-sided weakness, clumsiness and numbness.  Reportedly has had 4 episodes of falls in the past 6 months without loss of consciousness or any injury. MRI done in the ED showed acute small infarct in the posterior limb of the right internal capsule.  Patient outside the window for TPA and did not receive TPA.  Admitted for further management.  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  Patient is retired from [employer]. and lives with spouse. The couple has an adult daughter in [place] and lost a son to suicide 06/2018. Wife had a severe MVA and was diagnosed with cancer, is now in chemo.   He reports 3-4 times nocturia each night, but feeling refreshed and restored in AM, he needs a while to wake up since the CVA- but his wife stated differently- he is not alert, not oriented.  She endorsed symptoms such as dry mouth, morning headaches, and residual fatigue.  Naps are taken very frequently now, lasting from 20-50 minutes and are more refreshing than nocturnal sleep.  The patient endorsed the Epworth Sleepiness Scale at 16 /24points.   The patients weight 195 pounds with a height of 74 (inches), resulting in a BMI of 24.9 kg/m2. The patients neck circumference measured 16 inches.  CURRENT MEDICATIONS: Norvasc, Plavix, Ativan, Cozaar, Pamelor, Prilosec, Flomax, Levitra, Zyrtec.   PROCEDURE:  This is a multichannel digital polysomnogram utilizing the Somnostar 11.2 system.  Electrodes and sensors  were applied and monitored per AASM Specifications.   EEG, EOG, Chin and Limb EMG, were sampled at 200 Hz.  ECG, Snore and Nasal Pressure, Thermal Airflow, Respiratory Effort, CPAP Flow and Pressure, Oximetry was sampled at 50 Hz. Digital video and audio were recorded.      BASELINE STUDY: Lights Out was at 21:55 and Lights On at 04:59.  Total recording time (TRT) was 424.5 minutes, with a total sleep time (TST) of only 49.5 minutes.    The patients sleep latency was 174 minutes.  REM latency was 0 minutes.  The sleep efficiency was 11.7 %.     SLEEP ARCHITECTURE: WASO (Wake after sleep onset) was 149.5 minutes.  There were 2 minutes in Stage N1, 47.5 minutes Stage N2, 0 minutes Stage N3 and 0 minutes in Stage REM.  The percentage of Stage N1 was 4.%, Stage N2 was 96.%, Stage N3 was 0% and Stage R (REM sleep) was 0%.   RESPIRATORY ANALYSIS:  There were a total of 2 respiratory events:  1 obstructive apnea, 1 hypopnea with  The total APNEA/HYPOPNEA INDEX (AHI) was 2.4/hour.  0 events occurred in REM sleep and 2 events in NREM. The patient spent 49.5 minutes of total sleep time in the supine position and 0 minutes in non-supine.. The supine AHI was 2.4 versus a non-supine AHI of 0.0.  OXYGEN SATURATION & C02:  The Wake baseline 02 saturation was 94%, with the lowest being 90%. Time spent below 89% saturation equaled 0 minutes.  The arousals were noted as: 31 were spontaneous, 0  were associated with PLMs, 1 were associated with respiratory events. The patient had a total of 0 Periodic Limb Movements. Recorded were 3 bathroom breaks. Loud Coughing for much of the night. The patient was awake for much of the recording time.   EKG was in regular rhythm.   IMPRESSION: There was not enough sleep recorded to make a diagnosis of apnea, hypoxemia or see any REM sleep related activity of sleep.   This is not a usual sleep pattern for this patient. He reported back pain in the sleep lab bed- and if he  brought his Ativan medication with him as requested.  The patient has suffered a right internal capsular stroke and may have EDS related to the right hemispheric lesion.    RECOMMENDATIONS: repeat as HST - this is an invalid study.     I certify that I have reviewed the entire raw data recording prior to the issuance of this report in accordance with the Standards of Accreditation of the American Academy of Sleep Medicine (AASM)   Larey Seat, MD Diplomat, American Board of Psychiatry and Neurology  Diplomat, American Board of Sleep Medicine Market researcher, [place] [facility] [employer]
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
## 2020-02-13 NOTE — Addendum Note (Signed)
Addended by: Larey Seat on: 02/13/2020 05:08 PM   Modules accepted: Orders

## 2020-02-13 NOTE — Telephone Encounter (Signed)
The patient endorsed the Epworth Sleepiness Scale at 16 /24points.   The patient's weight 195 pounds with a height of 74 (inches), resulting in a BMI of 24.9 kg/m2. The patient's neck circumference measured 16 inches.  CURRENT MEDICATIONS: Norvasc, Plavix, Ativan, Cozaar, Pamelor, Prilosec, Flomax, Levitra, Zyrtec.   PROCEDURE:  This is a multichannel digital polysomnogram utilizing the Somnostar 11.2 system.  Electrodes and sensors were applied and monitored per AASM Specifications.   EEG, EOG, Chin and Limb EMG, were sampled at 200 Hz.  ECG, Snore and Nasal Pressure, Thermal Airflow, Respiratory Effort, CPAP Flow and Pressure, Oximetry was sampled at 50 Hz. Digital video and audio were recorded.      BASELINE STUDY: Lights Out was at 21:55 and Lights On at 04:59.  Total recording time (TRT) was 424.5 minutes, with a total sleep time (TST) of only 49.5 minutes.    The patient's sleep latency was 174 minutes.  REM latency was 0 minutes.  The sleep efficiency was 11.7 %.     SLEEP ARCHITECTURE: WASO (Wake after sleep onset) was 149.5 minutes.  There were 2 minutes in Stage N1, 47.5 minutes Stage N2, 0 minutes Stage N3 and 0 minutes in Stage REM.  The percentage of Stage N1 was 4.%, Stage N2 was 96.%, Stage N3 was 0% and Stage R (REM sleep) was 0%.   RESPIRATORY ANALYSIS:  There were a total of 2 respiratory events:  1 obstructive apnea, 1 hypopnea with  The total APNEA/HYPOPNEA INDEX (AHI) was 2.4/hour.  0 events occurred in REM sleep and 2 events in NREM. The patient spent 49.5 minutes of total sleep time in the supine position and 0 minutes in non-supine.. The supine AHI was 2.4 versus a non-supine AHI of 0.0.  OXYGEN SATURATION & C02:  The Wake baseline 02 saturation was 94%, with the lowest being 90%. Time spent below 89% saturation equaled 0 minutes.  The arousals were noted as: 31 were spontaneous, 0 were associated with PLMs, 1 were associated with respiratory events. The patient had a total  of 0 Periodic Limb Movements. Recorded were 3 bathroom breaks. Loud Coughing for much of the night. The patient was awake for much of the recording time.   EKG was in regular rhythm.   IMPRESSION: There was not enough sleep recorded to make a diagnosis of apnea, hypoxemia or see any REM sleep related activity of sleep.   This is not a usual sleep pattern for this patient. He reported back pain in the sleep lab bed- and if he brought his Ativan medication with him as requested.  The patient has suffered a right internal capsular stroke and may have EDS related to the right hemispheric lesion.    RECOMMENDATIONS: repeat as HST - this is an invalid study. Alternatively, we would make sure the patient brings his ATIVAN and takes it here in order to obtain a valid attended Sleep Study.     I certify that I have reviewed the entire raw data recording prior to the issuance of this report in accordance with the Standards of Accreditation of the American Academy of Sleep Medicine (AASM)   Larey Seat, MD Diplomat, American Board of Psychiatry and Neurology  Diplomat, American Board of Sleep Medicine Market researcher, [place] [facility] [employer]

## 2020-02-13 NOTE — Progress Notes (Signed)
IMPRESSION: There was not enough sleep recorded to make a diagnosis of apnea, hypoxemia or see any REM sleep related activity of sleep.   This is not a usual sleep pattern for this patient. He reported back pain in the sleep lab bed- and if he brought his Ativan medication with him as requested.  The patient has suffered a right internal capsular stroke and may have EDS related to the right hemispheric lesion.    RECOMMENDATIONS: repeat as HST - this is an invalid study.

## 2020-02-14 ENCOUNTER — Encounter: Payer: Self-pay | Admitting: Neurology

## 2020-02-14 DIAGNOSIS — H18529 Epithelial (juvenile) corneal dystrophy, unspecified eye: Secondary | ICD-10-CM | POA: Diagnosis not present

## 2020-02-15 ENCOUNTER — Other Ambulatory Visit: Payer: Self-pay

## 2020-02-15 ENCOUNTER — Ambulatory Visit: Payer: Medicare HMO | Attending: Internal Medicine

## 2020-02-15 DIAGNOSIS — R2681 Unsteadiness on feet: Secondary | ICD-10-CM | POA: Diagnosis not present

## 2020-02-15 DIAGNOSIS — Z9181 History of falling: Secondary | ICD-10-CM | POA: Insufficient documentation

## 2020-02-15 DIAGNOSIS — M6281 Muscle weakness (generalized): Secondary | ICD-10-CM | POA: Insufficient documentation
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
## 2020-02-20 ENCOUNTER — Other Ambulatory Visit: Payer: Self-pay

## 2020-02-20 ENCOUNTER — Encounter: Payer: Self-pay | Admitting: Physical Therapy

## 2020-02-20 ENCOUNTER — Ambulatory Visit: Payer: Medicare HMO | Admitting: Physical Therapy

## 2020-02-20 DIAGNOSIS — Z9181 History of falling: Secondary | ICD-10-CM | POA: Diagnosis not present

## 2020-02-20 DIAGNOSIS — R2681 Unsteadiness on feet: Secondary | ICD-10-CM

## 2020-02-20 DIAGNOSIS — M6281 Muscle weakness (generalized): Secondary | ICD-10-CM | POA: Diagnosis not present
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
## 2020-02-22 ENCOUNTER — Telehealth: Payer: Self-pay

## 2020-02-22 NOTE — Telephone Encounter (Signed)
LVM for pt to call me back to schedule sleep study  

## 2020-02-26 ENCOUNTER — Ambulatory Visit: Payer: Medicare HMO

## 2020-02-27 ENCOUNTER — Other Ambulatory Visit: Payer: Self-pay

## 2020-02-27 ENCOUNTER — Ambulatory Visit: Payer: Medicare HMO

## 2020-02-27 DIAGNOSIS — M6281 Muscle weakness (generalized): Secondary | ICD-10-CM | POA: Diagnosis not present

## 2020-02-27 DIAGNOSIS — R2681 Unsteadiness on feet: Secondary | ICD-10-CM | POA: Diagnosis not present

## 2020-02-27 DIAGNOSIS — Z9181 History of falling: Secondary | ICD-10-CM | POA: Diagnosis not present
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
## 2020-02-28 ENCOUNTER — Ambulatory Visit: Payer: Medicare HMO

## 2020-02-29 ENCOUNTER — Telehealth: Payer: Self-pay

## 2020-02-29 NOTE — Telephone Encounter (Signed)
LVM for pt to call me back to schedule sleep study  

## 2020-03-04 ENCOUNTER — Ambulatory Visit: Payer: Medicare HMO

## 2020-03-04 ENCOUNTER — Other Ambulatory Visit: Payer: Self-pay

## 2020-03-04 DIAGNOSIS — M6281 Muscle weakness (generalized): Secondary | ICD-10-CM

## 2020-03-04 DIAGNOSIS — R2681 Unsteadiness on feet: Secondary | ICD-10-CM

## 2020-03-04 DIAGNOSIS — Z9181 History of falling: Secondary | ICD-10-CM | POA: Diagnosis not present
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
## 2020-03-05 ENCOUNTER — Telehealth: Payer: Self-pay

## 2020-03-05 NOTE — Telephone Encounter (Signed)
LVM for pt to call me back to schedule sleep study   We have attempted to call the patient three times to schedule sleep study.  Patient has been unavailable at the phone numbers we have on file and has not returned our calls.  If patient calls back we will schedule them for their sleep study.  

## 2020-03-06 ENCOUNTER — Ambulatory Visit: Payer: Medicare HMO

## 2020-03-07 ENCOUNTER — Other Ambulatory Visit: Payer: Self-pay

## 2020-03-07 ENCOUNTER — Ambulatory Visit: Payer: Medicare HMO

## 2020-03-07 DIAGNOSIS — R2681 Unsteadiness on feet: Secondary | ICD-10-CM

## 2020-03-07 DIAGNOSIS — Z9181 History of falling: Secondary | ICD-10-CM | POA: Diagnosis not present

## 2020-03-07 DIAGNOSIS — M6281 Muscle weakness (generalized): Secondary | ICD-10-CM
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
## 2020-03-11 ENCOUNTER — Ambulatory Visit: Payer: Medicare HMO

## 2020-03-12 ENCOUNTER — Ambulatory Visit: Payer: Medicare HMO

## 2020-03-12 ENCOUNTER — Other Ambulatory Visit: Payer: Self-pay

## 2020-03-12 DIAGNOSIS — M6281 Muscle weakness (generalized): Secondary | ICD-10-CM | POA: Diagnosis not present

## 2020-03-12 DIAGNOSIS — R2681 Unsteadiness on feet: Secondary | ICD-10-CM | POA: Diagnosis not present

## 2020-03-12 DIAGNOSIS — Z9181 History of falling: Secondary | ICD-10-CM | POA: Diagnosis not present
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
## 2020-03-13 ENCOUNTER — Ambulatory Visit: Payer: Medicare HMO

## 2020-03-14 ENCOUNTER — Ambulatory Visit: Payer: Medicare HMO

## 2020-03-14 ENCOUNTER — Other Ambulatory Visit: Payer: Self-pay

## 2020-03-14 DIAGNOSIS — Z9181 History of falling: Secondary | ICD-10-CM

## 2020-03-14 DIAGNOSIS — R2681 Unsteadiness on feet: Secondary | ICD-10-CM | POA: Diagnosis not present

## 2020-03-14 DIAGNOSIS — M6281 Muscle weakness (generalized): Secondary | ICD-10-CM
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
## 2020-03-20 ENCOUNTER — Ambulatory Visit: Payer: Medicare HMO

## 2020-03-22 ENCOUNTER — Ambulatory Visit: Payer: Medicare HMO

## 2020-03-25 ENCOUNTER — Emergency Department: Payer: Medicare HMO

## 2020-03-25 ENCOUNTER — Other Ambulatory Visit: Payer: Self-pay

## 2020-03-25 ENCOUNTER — Encounter: Payer: Self-pay | Admitting: Emergency Medicine

## 2020-03-25 ENCOUNTER — Emergency Department
Admission: EM | Admit: 2020-03-25 | Discharge: 2020-03-25 | Disposition: A | Discharge status: Home / Self Care | Payer: Medicare HMO | Attending: Emergency Medicine | Admitting: Emergency Medicine

## 2020-03-25 DIAGNOSIS — Z8673 Personal history of transient ischemic attack (TIA), and cerebral infarction without residual deficits: Secondary | ICD-10-CM | POA: Insufficient documentation

## 2020-03-25 DIAGNOSIS — J3489 Other specified disorders of nose and nasal sinuses: Secondary | ICD-10-CM | POA: Diagnosis not present

## 2020-03-25 DIAGNOSIS — Z043 Encounter for examination and observation following other accident: Secondary | ICD-10-CM | POA: Diagnosis not present

## 2020-03-25 DIAGNOSIS — G319 Degenerative disease of nervous system, unspecified: Secondary | ICD-10-CM | POA: Diagnosis not present

## 2020-03-25 DIAGNOSIS — W19XXXA Unspecified fall, initial encounter: Secondary | ICD-10-CM

## 2020-03-25 DIAGNOSIS — Z87891 Personal history of nicotine dependence: Secondary | ICD-10-CM | POA: Insufficient documentation

## 2020-03-25 DIAGNOSIS — Z7902 Long term (current) use of antithrombotics/antiplatelets: Secondary | ICD-10-CM | POA: Insufficient documentation

## 2020-03-25 DIAGNOSIS — Z880 Allergy status to penicillin: Secondary | ICD-10-CM | POA: Diagnosis not present

## 2020-03-25 DIAGNOSIS — I6381 Other cerebral infarction due to occlusion or stenosis of small artery: Secondary | ICD-10-CM | POA: Diagnosis not present

## 2020-03-25 DIAGNOSIS — I639 Cerebral infarction, unspecified: Secondary | ICD-10-CM

## 2020-03-25 DIAGNOSIS — Z79899 Other long term (current) drug therapy: Secondary | ICD-10-CM | POA: Insufficient documentation

## 2020-03-25 DIAGNOSIS — S0990XA Unspecified injury of head, initial encounter: Secondary | ICD-10-CM | POA: Diagnosis not present

## 2020-03-25 DIAGNOSIS — I10 Essential (primary) hypertension: Secondary | ICD-10-CM | POA: Insufficient documentation

## 2020-03-25 DIAGNOSIS — Z882 Allergy status to sulfonamides status: Secondary | ICD-10-CM | POA: Diagnosis not present

## 2020-03-25 DIAGNOSIS — I6782 Cerebral ischemia: Secondary | ICD-10-CM | POA: Diagnosis not present

## 2020-03-25 DIAGNOSIS — R296 Repeated falls: Secondary | ICD-10-CM | POA: Diagnosis not present

## 2020-03-25 LAB — CBC WITH DIFFERENTIAL/PLATELET
Abs Immature Granulocytes: 0.03 10*3/uL (ref 0.00–0.07)
Basophils Absolute: 0 10*3/uL (ref 0.0–0.1)
Basophils Relative: 0 %
Eosinophils Absolute: 0.2 10*3/uL (ref 0.0–0.5)
Eosinophils Relative: 2 %
HCT: 42.2 % (ref 39.0–52.0)
Hemoglobin: 14.2 g/dL (ref 13.0–17.0)
Immature Granulocytes: 0 %
Lymphocytes Relative: 18 %
Lymphs Abs: 1.8 10*3/uL (ref 0.7–4.0)
MCH: 28.3 pg (ref 26.0–34.0)
MCHC: 33.6 g/dL (ref 30.0–36.0)
MCV: 84.1 fL (ref 80.0–100.0)
Monocytes Absolute: 1.5 10*3/uL — ABNORMAL HIGH (ref 0.1–1.0)
Monocytes Relative: 15 %
Neutro Abs: 6.6 10*3/uL (ref 1.7–7.7)
Neutrophils Relative %: 65 %
Platelets: 196 10*3/uL (ref 150–400)
RBC: 5.02 MIL/uL (ref 4.22–5.81)
RDW: 13.7 % (ref 11.5–15.5)
WBC: 10.3 10*3/uL (ref 4.0–10.5)
nRBC: 0 % (ref 0.0–0.2)

## 2020-03-25 LAB — URINALYSIS, COMPLETE (UACMP) WITH MICROSCOPIC
Bacteria, UA: NONE SEEN
Bilirubin Urine: NEGATIVE
Glucose, UA: NEGATIVE mg/dL
Hgb urine dipstick: NEGATIVE
Ketones, ur: NEGATIVE mg/dL
Leukocytes,Ua: NEGATIVE
Nitrite: NEGATIVE
Protein, ur: NEGATIVE mg/dL
Specific Gravity, Urine: 1.031 — ABNORMAL HIGH (ref 1.005–1.030)
pH: 5 (ref 5.0–8.0)

## 2020-03-25 LAB — COMPREHENSIVE METABOLIC PANEL
ALT: 23 U/L (ref 0–44)
AST: 26 U/L (ref 15–41)
Albumin: 4.2 g/dL (ref 3.5–5.0)
Alkaline Phosphatase: 69 U/L (ref 38–126)
Anion gap: 10 (ref 5–15)
BUN: 23 mg/dL (ref 8–23)
CO2: 27 mmol/L (ref 22–32)
Calcium: 9 mg/dL (ref 8.9–10.3)
Chloride: 102 mmol/L (ref 98–111)
Creatinine, Ser: 0.93 mg/dL (ref 0.61–1.24)
GFR, Estimated: >60 mL/min (ref >60)
Glucose, Bld: 121 mg/dL — ABNORMAL HIGH (ref 70–99)
Potassium: 3.8 mmol/L (ref 3.5–5.1)
Sodium: 139 mmol/L (ref 135–145)
Total Bilirubin: 1.8 mg/dL — ABNORMAL HIGH (ref 0.3–1.2)
Total Protein: 7.8 g/dL (ref 6.5–8.1)

## 2020-03-25 LAB — TROPONIN I (HIGH SENSITIVITY): Troponin I (High Sensitivity): 11 ng/L (ref <18)

## 2020-03-25 MED ORDER — MORPHINE SULFATE (PF) 4 MG/ML IV SOLN
4.0000 mg | Freq: Once | INTRAVENOUS | Status: AC
  Administered 2020-03-25: 4 mg via INTRAVENOUS
  Filled 2020-03-25: qty 1

## 2020-03-25 MED ORDER — ONDANSETRON HCL 4 MG/2ML IJ SOLN
4.0000 mg | Freq: Once | INTRAMUSCULAR | Status: AC
  Administered 2020-03-25: 4 mg via INTRAVENOUS
  Filled 2020-03-25: qty 2
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
## 2020-03-25 NOTE — ED Triage Notes (Signed)
Pt presents to ED via POV, pt states fell 3 times yesterday. Pt states 1st fall he was bent over and fell forward, denies injury, 2nd fall he was holding onto a railing and the railing broke, denies injury, the 3rd fall was attempting to step up onto a very tall step and fell backward. Pt states was sent by PCP for a scan of his head. Pt A&O x4, states normally walks with a walker, denies LOC.

## 2020-03-25 NOTE — ED Notes (Signed)
Family member came to the nurse desk to ask for updates about, "what is the waiting time for MRI?" This tech called to get updates about the timeline, notified the family member that we are looking closed to a hour. Manuela Schwartz, [place] aware.

## 2020-03-25 NOTE — ED Notes (Signed)
See triage note, pt reports fall x3 yesterday and was told he needs a CT scan of head today.  Hx of stroke in may with left sided deficits.  1st fall fell forward, 2nd fall lost balance, 3rd fall missed a step.  Pt ambulatory to treatment room with cane, slight unsteady gait.  Clear speech, alert and oriented, NAD noted.

## 2020-03-25 NOTE — ED Notes (Signed)
Pt given phone to be screened by MRI

## 2020-03-25 NOTE — Consult Note (Signed)
Reason for Consult:Abnormal CT Requesting Physician: Ellender Hose  CC: Falls  I have been asked by Dr. Ellender Hose to see this patient in consultation for acute infarct.  HPI: Francis Sims. is an 79 y.o. male with a history of acute infarct in May of 2021.  Patient presented at that time with falls, left sided numbness and dysmetria.  Patient noted to have a right BG infarct.  Patient has improved since that time.  Ambulates with a cane or without device in the home.  Only some mild left residual sensory issues.  On 10/10 fell three times.  One was related to a broken rail, one was when he bent over and the other was related to a step the he estimated incorrectly.  Due to the frequent falls patient was brought in for evaluation.    Past Medical History:  Diagnosis Date  . Burping    per pt lots of burping  . Chronic back pain    pinched nerve;buldging disc  . Dry skin    on elbows;uses vasaline and it goes away  . GERD (gastroesophageal reflux disease)    OTC  . Hemorrhoids   . Hx of colonic polyps    41yrs ago  . Hypertension   . Nocturia   . PONV (postoperative nausea and vomiting)     Past Surgical History:  Procedure Laterality Date  . BACK SURGERY    . BROW LIFT Right 07/12/2013   Procedure: CONTRACTURE  RELEASE ZPLASTY OF RIGHT EYE, PERIORBITAL AREA WITH REPAIR OF PTOSIS OF RIGHT EYE BROW;  Surgeon: Theodoro Kos, DO;  Location: [facility];  Service: Plastics;  Laterality: Right;  . CATARACT EXTRACTION  2008   bilateral  . CERVICAL FUSION  2000   C5,6,7  . COLONOSCOPY    . EYE SURGERY    . HERNIA REPAIR  1949  . LUMBAR LAMINECTOMY/DECOMPRESSION MICRODISCECTOMY  05/26/2011   Procedure: LUMBAR LAMINECTOMY/DECOMPRESSION MICRODISCECTOMY;  Surgeon: Olga Coaster Kritzer;  Location: [facility];  Service: Neurosurgery;  Laterality: Right;  Right Lumbar three-four Microdiskectomy  . TONSILLECTOMY      Family History  Problem Relation Age of Onset  . COPD Other   .  Anesthesia problems Neg Hx   . Hypotension Neg Hx   . Malignant hyperthermia Neg Hx   . Pseudochol deficiency Neg Hx     Social History:  reports that he has quit smoking. He has never used smokeless tobacco. He reports that he does not drink alcohol and does not use drugs.  Allergies  Allergen Reactions  . Penicillins Hives and Swelling  . Sulfonamide Derivatives Other (See Comments)    Almost stopped heart in the service..Also, causes hives  . Metoprolol     fatigue  . Hydroxyzine     Too strong  . Ibuprofen Itching  . Statins     aches    Medications: I have reviewed the patient's current medications. Prior to Admission medications   Medication Sig Start Date End Date Taking? Authorizing Provider  amLODipine (NORVASC) 5 MG tablet Take 1 tablet (5 mg total) by mouth daily. 12/05/19 12/04/20  Plotnikov, Evie Lacks, MD  cetirizine (ZYRTEC ALLERGY) 10 MG tablet Take 1 tablet (10 mg total) by mouth daily. 11/23/18 11/23/19  Plotnikov, Evie Lacks, MD  cholecalciferol (VITAMIN D) 1000 UNITS tablet Take 1,000 Units by mouth daily.      [provider]  clopidogrel (PLAVIX) 75 MG tablet Take 1 tablet (75 mg total) by mouth daily. 12/05/19  Plotnikov, Evie Lacks, MD  LORazepam (ATIVAN) 0.5 MG tablet Take 1 tablet (0.5 mg total) by mouth 2 (two) times daily as needed for anxiety. 12/05/19   Plotnikov, Evie Lacks, MD  losartan (COZAAR) 50 MG tablet Take 1 tablet (50 mg total) by mouth daily. 01/16/20   Plotnikov, Evie Lacks, MD  nortriptyline (PAMELOR) 10 MG capsule Take 1-2 capsules (10-20 mg total) by mouth at bedtime. 10/05/19   Plotnikov, Evie Lacks, MD  Omega-3 Fatty Acids (FISH OIL) 1000 MG CAPS Take 1 capsule by mouth 2 (two) times daily.      [provider]  omeprazole (PRILOSEC) 40 MG capsule TAKE ONE CAPSULE BY MOUTH EVERY DAY AS NEEDED Patient taking differently: Take 40 mg by mouth daily as needed.  05/01/19   Plotnikov, Evie Lacks, MD  tamsulosin (FLOMAX) 0.4 MG CAPS  capsule TAKE 1 CAPSULE BY MOUTH EVERY DAY 12/04/19   Plotnikov, Evie Lacks, MD  vardenafil (LEVITRA) 20 MG tablet Take 1 tablet (20 mg total) by mouth daily as needed for erectile dysfunction. 12/18/16   Plotnikov, Evie Lacks, MD  vitamin C (ASCORBIC ACID) 250 MG tablet Take 250 mg by mouth daily.    [provider]    ROS: History obtained from the patient  General ROS: negative for - chills, fatigue, fever, night sweats, weight gain or weight loss Psychological ROS: negative for - behavioral disorder, hallucinations, memory difficulties, mood swings or suicidal ideation Ophthalmic ROS: negative for - blurry vision, double vision, eye pain or loss of vision ENT ROS: negative for - epistaxis, nasal discharge, oral lesions, sore throat, tinnitus or vertigo Allergy and Immunology ROS: negative for - hives or itchy/watery eyes Hematological and Lymphatic ROS: negative for - bleeding problems, bruising or swollen lymph nodes Endocrine ROS: negative for - galactorrhea, hair pattern changes, polydipsia/polyuria or temperature intolerance Respiratory ROS: negative for - cough, hemoptysis, shortness of breath or wheezing Cardiovascular ROS: negative for - chest pain, dyspnea on exertion, edema or irregular heartbeat Gastrointestinal ROS: negative for - abdominal pain, diarrhea, hematemesis, nausea/vomiting or stool incontinence Genito-Urinary ROS: negative for - dysuria, hematuria, incontinence or urinary frequency/urgency Musculoskeletal ROS: negative for - joint swelling or muscular weakness Neurological ROS: as noted in HPI Dermatological ROS: negative for rash and skin lesion changes  Physical Examination: Blood pressure 135/64, pulse 70, temperature 97.6 F (36.4 C), temperature source Oral, resp. rate 16, height 6\' 2"  (1.88 m), weight 89.8 kg, SpO2 95 %.  HEENT-  Normocephalic, no lesions, without obvious abnormality.  Normal external eye and conjunctiva.  Normal TM's bilaterally.   Normal auditory canals and external ears. Normal external nose, mucus membranes and septum.  Normal pharynx. Cardiovascular- S1, S2 normal, pulses palpable throughout   Lungs- chest clear, no wheezing, rales, normal symmetric air entry, Heart exam - S1, S2 normal, no murmur, no gallop, rate regular Abdomen- soft, non-tender; bowel sounds normal; no masses,  no organomegaly Extremities- no edema Lymph-no adenopathy palpable Musculoskeletal-no joint tenderness, deformity or swelling Skin-warm and dry, no hyperpigmentation, vitiligo, or suspicious lesions  Neurological Examination   Mental Status: Alert, oriented, thought content appropriate.  Speech fluent without evidence of aphasia.  Able to follow 3 step commands without difficulty. Cranial Nerves: II: Visual fields grossly normal, pupils equal, round, reactive to light and accommodation III,IV, VI: ptosis not present, extra-ocular motions intact bilaterally V,VII: smile symmetric, facial light touch sensation normal bilaterally VIII: hearing normal bilaterally IX,X: gag reflex present XI: bilateral shoulder shrug XII: midline tongue extension Motor: Right :  Upper extremity   5/5    Left:     Upper extremity   5/5  Lower extremity   5/5     Lower extremity   5-/5 Tone and bulk:normal tone throughout; no atrophy noted Sensory: Pinprick and light touch intact throughout, bilaterally Deep Tendon Reflexes: Symmetric throughout Plantars: Right: mute   Left: mute Cerebellar: normal finger-to-nose and normal heel-to-shin testing bilaterally Gait: wide based    Laboratory Studies:   Basic Metabolic Panel: No results for input(s): NA, K, CL, CO2, GLUCOSE, BUN, CREATININE, CALCIUM, MG, PHOS in the last 168 hours.  Liver Function Tests: No results for input(s): AST, ALT, ALKPHOS, BILITOT, PROT, ALBUMIN in the last 168 hours. No results for input(s): LIPASE, AMYLASE in the last 168 hours. No results for input(s): AMMONIA in the last 168  hours.  CBC: Recent Labs  Lab 03/25/20 1559  WBC 10.3  NEUTROABS 6.6  HGB 14.2  HCT 42.2  MCV 84.1  PLT 196    Cardiac Enzymes: No results for input(s): CKTOTAL, CKMB, CKMBINDEX, TROPONINI in the last 168 hours.  BNP: Invalid input(s): POCBNP  CBG: No results for input(s): GLUCAP in the last 168 hours.  Microbiology: Results for orders placed or performed during the hospital encounter of 11/09/19  SARS Coronavirus 2 by RT PCR (hospital order, performed in [facility] hospital lab) Nasopharyngeal Nasopharyngeal Swab     Status: None   Collection Time: 11/09/19  4:45 PM   Specimen: Nasopharyngeal Swab  Result Value Ref Range Status   SARS Coronavirus 2 NEGATIVE NEGATIVE Final    Comment: (NOTE) SARS-CoV-2 target nucleic acids are NOT DETECTED. The SARS-CoV-2 RNA is generally detectable in upper and lower respiratory specimens during the acute phase of infection. The lowest concentration of SARS-CoV-2 viral copies this assay can detect is 250 copies / mL. A negative result does not preclude SARS-CoV-2 infection and should not be used as the sole basis for treatment or other patient management decisions.  A negative result may occur with improper specimen collection / handling, submission of specimen other than nasopharyngeal swab, presence of viral mutation(s) within the areas targeted by this assay, and inadequate number of viral copies (<250 copies / mL). A negative result must be combined with clinical observations, patient history, and epidemiological information. Fact Sheet for Patients:   StrictlyIdeas.no Fact Sheet for Healthcare Providers: BankingDealers.co.za This test is not yet approved or cleared  by the [place] FDA and has been authorized for detection and/or diagnosis of SARS-CoV-2 by FDA under an Emergency Use Authorization (EUA).  This EUA will remain in effect (meaning this test can be used) for the  duration of the COVID-19 declaration under Section 564(b)(1) of the Act, 21 U.S.C. section 360bbb-3(b)(1), unless the authorization is terminated or revoked sooner. Performed at [facility], [address] [place], [place] [address]   SARS Coronavirus 2 by RT PCR (hospital order, performed in [facility] hospital lab) Nasopharyngeal Nasopharyngeal Swab     Status: None   Collection Time: 11/20/19  6:27 PM   Specimen: Nasopharyngeal Swab  Result Value Ref Range Status   SARS Coronavirus 2 NEGATIVE NEGATIVE Final    Comment: (NOTE) SARS-CoV-2 target nucleic acids are NOT DETECTED. The SARS-CoV-2 RNA is generally detectable in upper and lower respiratory specimens during the acute phase of infection. The lowest concentration of SARS-CoV-2 viral copies this assay can detect is 250 copies / mL. A negative result does not preclude SARS-CoV-2 infection and should not be used as  the sole basis for treatment or other patient management decisions.  A negative result may occur with improper specimen collection / handling, submission of specimen other than nasopharyngeal swab, presence of viral mutation(s) within the areas targeted by this assay, and inadequate number of viral copies (<250 copies / mL). A negative result must be combined with clinical observations, patient history, and epidemiological information. Fact Sheet for Patients:   StrictlyIdeas.no Fact Sheet for Healthcare Providers: BankingDealers.co.za This test is not yet approved or cleared  by the [place] FDA and has been authorized for detection and/or diagnosis of SARS-CoV-2 by FDA under an Emergency Use Authorization (EUA).  This EUA will remain in effect (meaning this test can be used) for the duration of the COVID-19 declaration under Section 564(b)(1) of the Act, 21 U.S.C. section 360bbb-3(b)(1), unless the authorization is terminated or revoked  sooner. Performed at [facility], [address] [place], [place] [address]     Coagulation Studies: No results for input(s): LABPROT, INR in the last 72 hours.  Urinalysis:  Recent Labs  Lab 03/25/20 1419  COLORURINE YELLOW*  LABSPEC 1.031*  PHURINE 5.0  GLUCOSEU NEGATIVE  HGBUR NEGATIVE  BILIRUBINUR NEGATIVE  KETONESUR NEGATIVE  PROTEINUR NEGATIVE  NITRITE NEGATIVE  LEUKOCYTESUR NEGATIVE    Lipid Panel:     Component Value Date/Time   CHOL 185 11/10/2019 0644   TRIG 78 11/10/2019 0644   HDL 34 (L) 11/10/2019 0644   CHOLHDL 5.4 11/10/2019 0644   VLDL 16 11/10/2019 0644   LDLCALC 135 (H) 11/10/2019 0644    HgbA1C:  Lab Results  Component Value Date   HGBA1C 6.7 (H) 11/09/2019    Urine Drug Screen:  No results found for: LABOPIA, COCAINSCRNUR, LABBENZ, AMPHETMU, THCU, LABBARB  Alcohol Level: No results for input(s): ETH in the last 168 hours.  Other results: EKG: normal sinus rhythm at 70 bpm.  Imaging: CT Head Wo Contrast  Result Date: 03/25/2020 CLINICAL DATA:  Head trauma, minor. Additional history provided: 3 falls yesterday. EXAM: CT HEAD WITHOUT CONTRAST TECHNIQUE: Contiguous axial images were obtained from the base of the skull through the vertex without intravenous contrast. COMPARISON:  CT angiogram head 11/09/2019. FINDINGS: Brain: Stable, mild generalized cerebral atrophy. There is a 5 mm lacunar infarct within the right thalamocapsular junction which is new as compared to the prior examination of 11/09/2019 but otherwise age indeterminate (series 2, image 10) (series 4, image 57). Redemonstrated known small chronic infarcts within the left basal ganglia/subinsular white matter. There is no acute intracranial hemorrhage. No demarcated cortical infarct. No extra-axial fluid collection. No evidence of intracranial mass. No midline shift. Vascular: No hyperdense vessel.  Atherosclerotic calcifications. Skull: Normal. Negative for fracture or  focal lesion. Sinuses/Orbits: Visualized orbits show no acute finding. Other: Moderate ethmoid sinus mucosal thickening. No significant mastoid effusion at the imaged levels. IMPRESSION: No acute posttraumatic intracranial findings. A 5 mm lacunar infarct within the right thalamocapsular junction is new as compared to the prior exam of 11/09/2019, but otherwise age-indeterminate. Redemonstrated known small chronic infarcts within the left basal ganglia/subinsular white matter. Stable, mild generalized cerebral atrophy. Moderate ethmoid sinus mucosal thickening. Electronically Signed   By: Kellie Simmering DO   On: 03/25/2020 14:43     Assessment/Plan: 79 year old male with a history of HTN and cerebral infarct in May of 2021.  Patient now presents with frequent falls on yesterday.  Head CT performed in work up and personally reviewed.  There is a 82mm area in the  thalamocapsular region suggestive of a lacunar infarct.  Although not seen on the CT of May 2021 it is the area where his acute infarct was noted on MRI in May and has a more chronic appearance at this time.  I do not suspect this is acute.  Does not rule out the possibility of an acute infarct in another region though and further imaging is indicated.  Will rule out other possibilities as well.  Mechanical fall etiology does remain in the differential based on patient description of events.     Recommendations: 1. MRI of the brain without contrast.  If no acute infarct noted, no further imaging recommended at this time. 2. Orthostatic vitals 3. Routine lab work to rule out metabolic causes that could be causing patient to have poor balance.   4. If above work up is unremarkable patient may continue follow up on an outpatient basis.    Alexis Goodell, MD Neurology [phone] 03/25/2020, 4:17 PM

## 2020-03-25 NOTE — ED Notes (Signed)
Neurologist at bedside. 

## 2020-03-28 ENCOUNTER — Other Ambulatory Visit: Payer: Self-pay

## 2020-03-28 ENCOUNTER — Ambulatory Visit: Payer: Medicare HMO | Attending: Internal Medicine

## 2020-03-28 ENCOUNTER — Other Ambulatory Visit: Payer: Self-pay | Admitting: Internal Medicine

## 2020-03-28 VITALS — BP 131/62 | HR 59

## 2020-03-28 DIAGNOSIS — R2681 Unsteadiness on feet: Secondary | ICD-10-CM | POA: Diagnosis not present

## 2020-03-28 DIAGNOSIS — M6281 Muscle weakness (generalized): Secondary | ICD-10-CM | POA: Diagnosis not present
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
## 2020-04-02 ENCOUNTER — Ambulatory Visit: Payer: Medicare HMO

## 2020-04-02 ENCOUNTER — Other Ambulatory Visit: Payer: Self-pay

## 2020-04-02 VITALS — BP 138/61 | HR 55

## 2020-04-02 DIAGNOSIS — M6281 Muscle weakness (generalized): Secondary | ICD-10-CM

## 2020-04-02 DIAGNOSIS — R2681 Unsteadiness on feet: Secondary | ICD-10-CM
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
## 2020-04-03 DIAGNOSIS — R69 Illness, unspecified: Secondary | ICD-10-CM | POA: Diagnosis not present

## 2020-04-04 ENCOUNTER — Other Ambulatory Visit: Payer: Self-pay

## 2020-04-04 ENCOUNTER — Ambulatory Visit: Payer: Medicare HMO

## 2020-04-04 DIAGNOSIS — R2681 Unsteadiness on feet: Secondary | ICD-10-CM | POA: Diagnosis not present

## 2020-04-04 DIAGNOSIS — M6281 Muscle weakness (generalized): Secondary | ICD-10-CM | POA: Diagnosis not present
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
## 2020-04-09 ENCOUNTER — Ambulatory Visit: Payer: Medicare HMO

## 2020-04-09 ENCOUNTER — Other Ambulatory Visit: Payer: Self-pay

## 2020-04-09 DIAGNOSIS — M6281 Muscle weakness (generalized): Secondary | ICD-10-CM

## 2020-04-09 DIAGNOSIS — R2681 Unsteadiness on feet: Secondary | ICD-10-CM
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
## 2020-04-11 ENCOUNTER — Other Ambulatory Visit: Payer: Self-pay

## 2020-04-11 ENCOUNTER — Ambulatory Visit: Payer: Medicare HMO

## 2020-04-11 DIAGNOSIS — R2681 Unsteadiness on feet: Secondary | ICD-10-CM | POA: Diagnosis not present

## 2020-04-11 DIAGNOSIS — M6281 Muscle weakness (generalized): Secondary | ICD-10-CM | POA: Diagnosis not present
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
## 2020-04-16 ENCOUNTER — Ambulatory Visit: Payer: Medicare HMO

## 2020-04-17 ENCOUNTER — Encounter: Payer: Self-pay | Admitting: Internal Medicine

## 2020-04-17 ENCOUNTER — Other Ambulatory Visit: Payer: Self-pay

## 2020-04-17 ENCOUNTER — Ambulatory Visit (INDEPENDENT_AMBULATORY_CARE_PROVIDER_SITE_OTHER): Payer: Medicare HMO | Admitting: Internal Medicine

## 2020-04-17 DIAGNOSIS — I6359 Cerebral infarction due to unspecified occlusion or stenosis of other cerebral artery: Secondary | ICD-10-CM

## 2020-04-17 DIAGNOSIS — J209 Acute bronchitis, unspecified: Secondary | ICD-10-CM

## 2020-04-17 DIAGNOSIS — R634 Abnormal weight loss: Secondary | ICD-10-CM

## 2020-04-17 DIAGNOSIS — I10 Essential (primary) hypertension: Secondary | ICD-10-CM | POA: Diagnosis not present

## 2020-04-17 MED ORDER — AMLODIPINE BESYLATE 2.5 MG PO TABS
2.5000 mg | ORAL_TABLET | Freq: Every day | ORAL | 3 refills | Status: DC
Start: 2020-04-17 — End: 2021-04-08

## 2020-04-17 MED ORDER — AZITHROMYCIN 250 MG PO TABS: ORAL_TABLET | ORAL | 0 refills | Status: DC

## 2020-04-17 MED ORDER — NORTRIPTYLINE HCL 10 MG PO CAPS: 10.0000 mg | ORAL_CAPSULE | Freq: Every day | ORAL | 1 refills | Status: DC

## 2020-04-17 MED ORDER — LORAZEPAM 0.5 MG PO TABS: 0.5000 mg | ORAL_TABLET | Freq: Two times a day (BID) | ORAL | 2 refills | Status: DC | PRN

## 2020-04-17 NOTE — Assessment & Plan Note (Signed)
Zpac 

## 2020-04-17 NOTE — Assessment & Plan Note (Signed)
Wt Readings from Last 3 Encounters:  04/17/20 202 lb 6.4 oz (91.8 kg)  03/25/20 198 lb (89.8 kg)  01/22/20 195 lb (88.5 kg)

## 2020-04-17 NOTE — Progress Notes (Signed)
Subjective:  Patient ID: Francis Sims., male    DOB: [birth year]  Age: 79 y.o. MRN: [id number]  CC: Follow-up (3 month F/U) and Medication Refill (Lorazepam, Nortriptyline, )   Stepen M Nolte Jr. presents for CVA, stress, HTN C/o feet swelling  ER notes from 3 wks ago reviewed  Outpatient Medications Prior to Visit  Medication Sig Dispense Refill  . amLODipine (NORVASC) 5 MG tablet Take 1 tablet (5 mg total) by mouth daily. 90 tablet 3  . cholecalciferol (VITAMIN D) 1000 UNITS tablet Take 1,000 Units by mouth daily.      . clopidogrel (PLAVIX) 75 MG tablet Take 1 tablet (75 mg total) by mouth daily. 90 tablet 3  . LORazepam (ATIVAN) 0.5 MG tablet Take 1 tablet (0.5 mg total) by mouth 2 (two) times daily as needed for anxiety. 60 tablet 2  . losartan (COZAAR) 50 MG tablet Take 1 tablet (50 mg total) by mouth daily. 90 tablet 3  . nortriptyline (PAMELOR) 10 MG capsule Take 1-2 capsules (10-20 mg total) by mouth at bedtime. 180 capsule 1  . Omega-3 Fatty Acids (FISH OIL) 1000 MG CAPS Take 1 capsule by mouth 2 (two) times daily.      Marland Kitchen omeprazole (PRILOSEC) 40 MG capsule TAKE ONE CAPSULE BY MOUTH EVERY DAY AS NEEDED 90 capsule 1  . tamsulosin (FLOMAX) 0.4 MG CAPS capsule TAKE 1 CAPSULE BY MOUTH EVERY DAY 90 capsule 1  . vardenafil (LEVITRA) 20 MG tablet Take 1 tablet (20 mg total) by mouth daily as needed for erectile dysfunction. 90 tablet 1  . vitamin C (ASCORBIC ACID) 250 MG tablet Take 250 mg by mouth daily.    . cetirizine (ZYRTEC ALLERGY) 10 MG tablet Take 1 tablet (10 mg total) by mouth daily. 30 tablet 11   No facility-administered medications prior to visit.    ROS: Review of Systems  Constitutional: Negative for appetite change, fatigue and unexpected weight change.  HENT: Negative for congestion, nosebleeds, sneezing, sore throat and trouble swallowing.   Eyes: Negative for itching and visual disturbance.  Respiratory: Negative for cough.   Cardiovascular: Positive  for leg swelling. Negative for chest pain and palpitations.  Gastrointestinal: Negative for abdominal distention, blood in stool, diarrhea and nausea.  Genitourinary: Negative for frequency and hematuria.  Musculoskeletal: Positive for gait problem. Negative for back pain, joint swelling and neck pain.  Skin: Negative for rash.  Neurological: Negative for dizziness, tremors, speech difficulty and weakness.  Psychiatric/Behavioral: Negative for agitation, dysphoric mood and sleep disturbance. The patient is not nervous/anxious.     Objective:  BP 138/80 (BP Location: Left Arm)   Pulse 68   Temp 98.7 F (37.1 C) (Oral)   Wt 202 lb 6.4 oz (91.8 kg)   SpO2 96%   BMI 25.99 kg/m   BP Readings from Last 3 Encounters:  04/17/20 138/80  04/02/20 138/61  03/28/20 131/62    Wt Readings from Last 3 Encounters:  04/17/20 202 lb 6.4 oz (91.8 kg)  03/25/20 198 lb (89.8 kg)  01/22/20 195 lb (88.5 kg)    Physical Exam Constitutional:      General: He is not in acute distress.    Appearance: He is well-developed.     Comments: NAD  Eyes:     Conjunctiva/sclera: Conjunctivae normal.     Pupils: Pupils are equal, round, and reactive to light.  Neck:     Thyroid: No thyromegaly.     Vascular: No JVD.  Cardiovascular:  Rate and Rhythm: Normal rate and regular rhythm.     Heart sounds: Normal heart sounds. No murmur heard.  No friction rub. No gallop.   Pulmonary:     Effort: Pulmonary effort is normal. No respiratory distress.     Breath sounds: Normal breath sounds. No wheezing or rales.  Chest:     Chest wall: No tenderness.  Abdominal:     General: Bowel sounds are normal. There is no distension.     Palpations: Abdomen is soft. There is no mass.     Tenderness: There is no abdominal tenderness. There is no guarding or rebound.  Musculoskeletal:        General: No tenderness. Normal range of motion.     Cervical back: Normal range of motion.     Right lower leg: Edema  present.     Left lower leg: Edema present.  Lymphadenopathy:     Cervical: No cervical adenopathy.  Skin:    General: Skin is warm and dry.     Findings: No rash.  Neurological:     Mental Status: He is alert and oriented to person, place, and time.     Cranial Nerves: No cranial nerve deficit.     Motor: No abnormal muscle tone.     Coordination: Coordination normal.     Gait: Gait normal.     Deep Tendon Reflexes: Reflexes are normal and symmetric.  Psychiatric:        Behavior: Behavior normal.        Thought Content: Thought content normal.        Judgment: Judgment normal.    B trace edema   Lab Results  Component Value Date   WBC 10.3 03/25/2020   HGB 14.2 03/25/2020   HCT 42.2 03/25/2020   PLT 196 03/25/2020   GLUCOSE 121 (H) 03/25/2020   CHOL 185 11/10/2019   TRIG 78 11/10/2019   HDL 34 (L) 11/10/2019   LDLDIRECT 105.0 05/31/2019   LDLCALC 135 (H) 11/10/2019   ALT 23 03/25/2020   AST 26 03/25/2020   NA 139 03/25/2020   K 3.8 03/25/2020   CL 102 03/25/2020   CREATININE 0.93 03/25/2020   BUN 23 03/25/2020   CO2 27 03/25/2020   TSH 1.21 05/31/2019   PSA 0.30 05/31/2019   INR 1.0 11/09/2019   HGBA1C 6.7 (H) 11/09/2019    CT Head Wo Contrast  Result Date: 03/25/2020 CLINICAL DATA:  Head trauma, minor. Additional history provided: 3 falls yesterday. EXAM: CT HEAD WITHOUT CONTRAST TECHNIQUE: Contiguous axial images were obtained from the base of the skull through the vertex without intravenous contrast. COMPARISON:  CT angiogram head 11/09/2019. FINDINGS: Brain: Stable, mild generalized cerebral atrophy. There is a 5 mm lacunar infarct within the right thalamocapsular junction which is new as compared to the prior examination of 11/09/2019 but otherwise age indeterminate (series 2, image 15) (series 4, image 37). Redemonstrated known small chronic infarcts within the left basal ganglia/subinsular white matter. There is no acute intracranial hemorrhage. No  demarcated cortical infarct. No extra-axial fluid collection. No evidence of intracranial mass. No midline shift. Vascular: No hyperdense vessel.  Atherosclerotic calcifications. Skull: Normal. Negative for fracture or focal lesion. Sinuses/Orbits: Visualized orbits show no acute finding. Other: Moderate ethmoid sinus mucosal thickening. No significant mastoid effusion at the imaged levels. IMPRESSION: No acute posttraumatic intracranial findings. A 5 mm lacunar infarct within the right thalamocapsular junction is new as compared to the prior exam of 11/09/2019, but otherwise age-indeterminate. Redemonstrated  known small chronic infarcts within the left basal ganglia/subinsular white matter. Stable, mild generalized cerebral atrophy. Moderate ethmoid sinus mucosal thickening. Electronically Signed   By: Kellie Simmering DO   On: 03/25/2020 14:43   MR BRAIN WO CONTRAST  Result Date: 03/25/2020 CLINICAL DATA:  Multiple recent falls. EXAM: MRI HEAD WITHOUT CONTRAST TECHNIQUE: Multiplanar, multiecho pulse sequences of the brain and surrounding structures were obtained without intravenous contrast. COMPARISON:  Brain MRI 11/09/2019 FINDINGS: Brain: No acute infarct, acute hemorrhage or extra-axial collection. Multifocal hyperintense T2-weighted signal within the white matter. There is generalized atrophy without lobar predilection. No chronic microhemorrhage. Normal midline structures. Vascular: Normal flow voids. Skull and upper cervical spine: Normal marrow signal. Sinuses/Orbits: Negative. Other: None. IMPRESSION: 1. No acute intracranial abnormality. 2. Generalized atrophy and findings of chronic small vessel ischemic disease. Electronically Signed   By: Ulyses Jarred M.D.   On: 03/25/2020 19:04    Assessment & Plan:      [name], MD

## 2020-04-17 NOTE — Patient Instructions (Signed)
Sign up for [employer] ( via [employer] on your phone or your ipad). If you don't have a Art therapist card  - go to [employer] branch. They will set you up in 15 minutes. It is free. You can check out books to read and to listen, check out magazines and newspapers, movies etc.   [place] series

## 2020-04-17 NOTE — Assessment & Plan Note (Signed)
In PT Reduce Amlodipine dose due to swelling try Lion's Mane Mushroom extract or capsules

## 2020-04-17 NOTE — Assessment & Plan Note (Signed)
Reduce Amlodipine dose due to swelling

## 2020-04-18 ENCOUNTER — Ambulatory Visit: Payer: Medicare HMO

## 2020-04-25 ENCOUNTER — Ambulatory Visit: Payer: Medicare HMO

## 2020-04-29 ENCOUNTER — Telehealth: Payer: Self-pay | Admitting: Internal Medicine

## 2020-04-29 MED ORDER — HYDROXYZINE HCL 25 MG PO TABS: 25.0000 mg | ORAL_TABLET | Freq: Every evening | ORAL | 1 refills | Status: DC | PRN

## 2020-04-29 NOTE — Telephone Encounter (Signed)
hydrOXYzine (ATARAX/VISTARIL Patient states Dr. Alain Marion put him on this medication and when he was taking three a day it was making the itching worse and when he only took one it made it better so patient is wondering if he can have a refill for this medication for his itching. [id number] [place], [place] - [address] Phone:  [phone]  Fax:  [phone]

## 2020-04-29 NOTE — Telephone Encounter (Signed)
Ok Done Thx 

## 2020-04-29 NOTE — Telephone Encounter (Signed)
Notified pt rx has been sent to [facility].Marland KitchenJohny Sims

## 2020-04-29 NOTE — Telephone Encounter (Signed)
Medication no longer on med list pls advise.Marland KitchenJohny Chess

## 2020-04-30 ENCOUNTER — Ambulatory Visit: Payer: Medicare HMO

## 2020-05-02 ENCOUNTER — Ambulatory Visit: Payer: Medicare HMO

## 2020-05-06 ENCOUNTER — Ambulatory Visit: Payer: Medicare HMO

## 2020-05-13 DIAGNOSIS — H5203 Hypermetropia, bilateral: Secondary | ICD-10-CM | POA: Diagnosis not present

## 2020-05-14 DIAGNOSIS — H52223 Regular astigmatism, bilateral: Secondary | ICD-10-CM | POA: Diagnosis not present

## 2020-05-14 DIAGNOSIS — H524 Presbyopia: Secondary | ICD-10-CM | POA: Diagnosis not present

## 2020-05-17 ENCOUNTER — Telehealth: Payer: Self-pay | Admitting: *Deleted

## 2020-05-17 NOTE — Telephone Encounter (Signed)
Rec'd PA for Hydroxyzine 25 mg. Completed vis [employer] w/Key #( [id number]). Rec'd msg stating " Your information has been submitted to [employer]. [employer] will review the request and will issue a decision, typically within 1-3 days from your submission".Marland KitchenJohny Chess

## 2020-05-20 NOTE — Telephone Encounter (Signed)
Rec'd " Notice of Approval" med hs ben approved for time period 06/16/19 - 06/14/20. [facility]

## 2020-05-28 ENCOUNTER — Telehealth: Payer: Self-pay | Admitting: Internal Medicine

## 2020-05-28 NOTE — Telephone Encounter (Addendum)
[facility] - Francis Sims is calling about referring RX that was sent over on 03/11/20 for L ASO.  She has faxed over something 4 times about this and needs it return AND a call back.  [place]

## 2020-05-31 NOTE — Telephone Encounter (Signed)
Dawn is calling again. Can she please get as follow up call.

## 2020-06-05 NOTE — Telephone Encounter (Signed)
MD finally completed fax back w/approval confirmation that it was receive...[name]

## 2020-06-06 DIAGNOSIS — M21372 Foot drop, left foot: Secondary | ICD-10-CM | POA: Diagnosis not present

## 2020-06-18 ENCOUNTER — Ambulatory Visit (INDEPENDENT_AMBULATORY_CARE_PROVIDER_SITE_OTHER): Payer: Medicare HMO

## 2020-06-18 DIAGNOSIS — Z0000 Encounter for general adult medical examination without abnormal findings: Secondary | ICD-10-CM

## 2020-06-18 NOTE — Patient Instructions (Signed)
[name] , Thank you for taking time to come for your Medicare Wellness Visit. I appreciate your ongoing commitment to your health goals. Please review the following plan we discussed and let me know if I can assist you in the future.   Screening recommendations/referrals: Colonoscopy: 06/26/2014; due every 5-10 years Recommended yearly ophthalmology/optometry visit for glaucoma screening and checkup Recommended yearly dental visit for hygiene and checkup  Vaccinations: Influenza vaccine: 02/14/2020 Pneumococcal vaccine: up to date Tdap vaccine: 11/29/2012 Shingles vaccine: never done   Covid-19: up to date  Advanced directives: Please bring a copy of your health care power of attorney and living will to the office at your convenience.  Conditions/risks identified: Yes; Reviewed health maintenance screenings with patient today and relevant education, vaccines, and/or referrals were provided. Please continue to do your personal lifestyle choices by: daily care of teeth and gums, regular physical activity (goal should be 5 days a week for 30 minutes), eat a healthy diet, avoid tobacco and drug use, limiting any alcohol intake, taking a low-dose aspirin (if not allergic or have been advised by your provider otherwise) and taking vitamins and minerals as recommended by your provider. Continue doing brain stimulating activities (puzzles, reading, adult coloring books, staying active) to keep memory sharp. Continue to eat heart healthy diet (full of fruits, vegetables, whole grains, lean protein, water--limit salt, fat, and sugar intake) and increase physical activity as tolerated.  Next appointment: Please schedule your next Medicare Wellness Visit with your Nurse Health Advisor in 1 year by calling [phone].  Preventive Care 18 Years and Older, Male Preventive care refers to lifestyle choices and visits with your health care provider that can promote health and wellness. What does preventive  care include?  A yearly physical exam. This is also called an annual well check.  Dental exams once or twice a year.  Routine eye exams. Ask your health care provider how often you should have your eyes checked.  Personal lifestyle choices, including:  Daily care of your teeth and gums.  Regular physical activity.  Eating a healthy diet.  Avoiding tobacco and drug use.  Limiting alcohol use.  Practicing safe sex.  Taking low doses of aspirin every day.  Taking vitamin and mineral supplements as recommended by your health care provider. What happens during an annual well check? The services and screenings done by your health care provider during your annual well check will depend on your age, overall health, lifestyle risk factors, and family history of disease. Counseling  Your health care provider may ask you questions about your:  Alcohol use.  Tobacco use.  Drug use.  Emotional well-being.  Home and relationship well-being.  Sexual activity.  Eating habits.  History of falls.  Memory and ability to understand (cognition).  Work and work Astronomer. Screening  You may have the following tests or measurements:  Height, weight, and BMI.  Blood pressure.  Lipid and cholesterol levels. These may be checked every 5 years, or more frequently if you are over 82 years old.  Skin check.  Lung cancer screening. You may have this screening every year starting at age 56 if you have a 30-pack-year history of smoking and currently smoke or have quit within the past 15 years.  Fecal occult blood test (FOBT) of the stool. You may have this test every year starting at age 32.  Flexible sigmoidoscopy or colonoscopy. You may have a sigmoidoscopy every 5 years or a colonoscopy every 10 years starting at age 32.  Prostate cancer screening. Recommendations will vary depending on your family history and other risks.  Hepatitis C blood test.  Hepatitis B blood  test.  Sexually transmitted disease (STD) testing.  Diabetes screening. This is done by checking your blood sugar (glucose) after you have not eaten for a while (fasting). You may have this done every 1-3 years.  Abdominal aortic aneurysm (AAA) screening. You may need this if you are a current or former smoker.  Osteoporosis. You may be screened starting at age 2 if you are at high risk. Talk with your health care provider about your test results, treatment options, and if necessary, the need for more tests. Vaccines  Your health care provider may recommend certain vaccines, such as:  Influenza vaccine. This is recommended every year.  Tetanus, diphtheria, and acellular pertussis (Tdap, Td) vaccine. You may need a Td booster every 10 years.  Zoster vaccine. You may need this after age 77.  Pneumococcal 13-valent conjugate (PCV13) vaccine. One dose is recommended after age 12.  Pneumococcal polysaccharide (PPSV23) vaccine. One dose is recommended after age 68. Talk to your health care provider about which screenings and vaccines you need and how often you need them. This information is not intended to replace advice given to you by your health care provider. Make sure you discuss any questions you have with your health care provider. Document Released: 06/28/2015 Document Revised: 02/19/2016 Document Reviewed: 04/02/2015 Elsevier Interactive Patient Education  2017 [employer] Prevention in the Home Falls can cause injuries. They can happen to people of all ages. There are many things you can do to make your home safe and to help prevent falls. What can I do on the outside of my home?  Regularly fix the edges of walkways and driveways and fix any cracks.  Remove anything that might make you trip as you walk through a door, such as a raised step or threshold.  Trim any bushes or trees on the path to your home.  Use bright outdoor lighting.  Clear any walking paths of  anything that might make someone trip, such as rocks or tools.  Regularly check to see if handrails are loose or broken. Make sure that both sides of any steps have handrails.  Any raised decks and porches should have guardrails on the edges.  Have any leaves, snow, or ice cleared regularly.  Use sand or salt on walking paths during winter.  Clean up any spills in your garage right away. This includes oil or grease spills. What can I do in the bathroom?  Use night lights.  Install grab bars by the toilet and in the tub and shower. Do not use towel bars as grab bars.  Use non-skid mats or decals in the tub or shower.  If you need to sit down in the shower, use a plastic, non-slip stool.  Keep the floor dry. Clean up any water that spills on the floor as soon as it happens.  Remove soap buildup in the tub or shower regularly.  Attach bath mats securely with double-sided non-slip rug tape.  Do not have throw rugs and other things on the floor that can make you trip. What can I do in the bedroom?  Use night lights.  Make sure that you have a light by your bed that is easy to reach.  Do not use any sheets or blankets that are too big for your bed. They should not hang down onto the floor.  Have  a firm chair that has side arms. You can use this for support while you get dressed.  Do not have throw rugs and other things on the floor that can make you trip. What can I do in the kitchen?  Clean up any spills right away.  Avoid walking on wet floors.  Keep items that you use a lot in easy-to-reach places.  If you need to reach something above you, use a strong step stool that has a grab bar.  Keep electrical cords out of the way.  Do not use floor polish or wax that makes floors slippery. If you must use wax, use non-skid floor wax.  Do not have throw rugs and other things on the floor that can make you trip. What can I do with my stairs?  Do not leave any items on the  stairs.  Make sure that there are handrails on both sides of the stairs and use them. Fix handrails that are broken or loose. Make sure that handrails are as long as the stairways.  Check any carpeting to make sure that it is firmly attached to the stairs. Fix any carpet that is loose or worn.  Avoid having throw rugs at the top or bottom of the stairs. If you do have throw rugs, attach them to the floor with carpet tape.  Make sure that you have a light switch at the top of the stairs and the bottom of the stairs. If you do not have them, ask someone to add them for you. What else can I do to help prevent falls?  Wear shoes that:  Do not have high heels.  Have rubber bottoms.  Are comfortable and fit you well.  Are closed at the toe. Do not wear sandals.  If you use a stepladder:  Make sure that it is fully opened. Do not climb a closed stepladder.  Make sure that both sides of the stepladder are locked into place.  Ask someone to hold it for you, if possible.  Clearly mark and make sure that you can see:  Any grab bars or handrails.  First and last steps.  Where the edge of each step is.  Use tools that help you move around (mobility aids) if they are needed. These include:  Canes.  Walkers.  Scooters.  Crutches.  Turn on the lights when you go into a dark area. Replace any light bulbs as soon as they burn out.  Set up your furniture so you have a clear path. Avoid moving your furniture around.  If any of your floors are uneven, fix them.  If there are any pets around you, be aware of where they are.  Review your medicines with your doctor. Some medicines can make you feel dizzy. This can increase your chance of falling. Ask your doctor what other things that you can do to help prevent falls. This information is not intended to replace advice given to you by your health care provider. Make sure you discuss any questions you have with your health care  provider. Document Released: 03/28/2009 Document Revised: 11/07/2015 Document Reviewed: 07/06/2014 Elsevier Interactive Patient Education  2017 [employer].

## 2020-06-18 NOTE — Progress Notes (Addendum)
I connected with Francis Sims today by telephone and verified that I am speaking with the correct person using two identifiers. Location patient: home Location provider: work Persons participating in the virtual visit: Gram Caple and Lisette Abu, LPN.  I discussed the limitations, risks, security and privacy concerns of performing an evaluation and management service by telephone and the availability of in person appointments. I also discussed with the patient that there may be a patient responsible charge related to this service. The patient expressed understanding and verbally consented to this telephonic visit.    Interactive audio and video telecommunications were attempted between this provider and patient, however failed, due to patient having technical difficulties OR patient did not have access to video capability.  We continued and completed visit with audio only.  Some vital signs may be absent or patient reported.   Time Spent with patient on telephone encounter: 30 minutes  Subjective:   Francis Sims. is a 80 y.o. male who presents for Medicare Annual/Subsequent preventive examination.  Review of Systems    No ROS. Medicare Wellness Visit. Additional risk factors are reflected in social history. Cardiac Risk Factors include: advanced age (>15men, >70 women);dyslipidemia;hypertension;male gender     Objective:    There were no vitals filed for this visit. There is no height or weight on file to calculate BMI.  Advanced Directives 06/18/2020 03/25/2020 11/09/2019 09/03/2016 07/07/2013 07/24/2011 07/24/2011  Does Patient Have a Medical Advance Directive? Yes No No Yes Patient has advance directive, copy not in chart Patient does not have advance directive -  Type of Advance Directive [place];Living will - - [place];Living will Living will - -  Does patient want to make changes to medical advance directive? No - Patient  declined - - - - - -  Copy of [place] in Chart? No - copy requested - - - - - -  Would patient like information on creating a medical advance directive? - No - Patient declined No - Patient declined - - - -  Pre-existing out of facility DNR order (yellow form or pink MOST form) - - - - - No No    Current Medications (verified) Outpatient Encounter Medications as of 06/18/2020  Medication Sig   amLODipine (NORVASC) 2.5 MG tablet Take 1 tablet (2.5 mg total) by mouth daily.   azithromycin (ZITHROMAX Z-PAK) 250 MG tablet As directed   cetirizine (ZYRTEC ALLERGY) 10 MG tablet Take 1 tablet (10 mg total) by mouth daily.   cholecalciferol (VITAMIN D) 1000 UNITS tablet Take 1,000 Units by mouth daily.     clopidogrel (PLAVIX) 75 MG tablet Take 1 tablet (75 mg total) by mouth daily.   hydrOXYzine (ATARAX/VISTARIL) 25 MG tablet Take 1 tablet (25 mg total) by mouth at bedtime as needed for itching.   LORazepam (ATIVAN) 0.5 MG tablet Take 1 tablet (0.5 mg total) by mouth 2 (two) times daily as needed for anxiety.   losartan (COZAAR) 50 MG tablet Take 1 tablet (50 mg total) by mouth daily.   nortriptyline (PAMELOR) 10 MG capsule Take 1-2 capsules (10-20 mg total) by mouth at bedtime.   Omega-3 Fatty Acids (FISH OIL) 1000 MG CAPS Take 1 capsule by mouth 2 (two) times daily.     omeprazole (PRILOSEC) 40 MG capsule TAKE ONE CAPSULE BY MOUTH EVERY DAY AS NEEDED   tamsulosin (FLOMAX) 0.4 MG CAPS capsule TAKE 1 CAPSULE BY MOUTH EVERY DAY   vardenafil (  LEVITRA) 20 MG tablet Take 1 tablet (20 mg total) by mouth daily as needed for erectile dysfunction.   vitamin C (ASCORBIC ACID) 250 MG tablet Take 250 mg by mouth daily.   No facility-administered encounter medications on file as of 06/18/2020.    Allergies (verified) Penicillins, Sulfonamide derivatives, Metoprolol, Hydroxyzine, Ibuprofen, and Statins   History: Past Medical History:  Diagnosis Date   Burping    per pt lots of burping    Chronic back pain    pinched nerve;buldging disc   Dry skin    on elbows;uses vasaline and it goes away   GERD (gastroesophageal reflux disease)    OTC   Hemorrhoids    Hx of colonic polyps    31yrs ago   Hypertension    Nocturia    PONV (postoperative nausea and vomiting)    Past Surgical History:  Procedure Laterality Date   BACK SURGERY     BROW LIFT Right 07/12/2013   Procedure: CONTRACTURE  RELEASE ZPLASTY OF RIGHT EYE, PERIORBITAL AREA WITH REPAIR OF PTOSIS OF RIGHT EYE BROW;  Surgeon: Wayland Denis, DO;  Location: [facility];  Service: Plastics;  Laterality: Right;   CATARACT EXTRACTION  2008   bilateral   CERVICAL FUSION  2000   C5,6,7   COLONOSCOPY     EYE SURGERY     HERNIA REPAIR  1949   LUMBAR LAMINECTOMY/DECOMPRESSION MICRODISCECTOMY  05/26/2011   Procedure: LUMBAR LAMINECTOMY/DECOMPRESSION MICRODISCECTOMY;  Surgeon: Rolanda Lundborg Kritzer;  Location: [facility];  Service: Neurosurgery;  Laterality: Right;  Right Lumbar three-four Microdiskectomy   TONSILLECTOMY     Family History  Problem Relation Age of Onset   COPD Other    Anesthesia problems Neg Hx    Hypotension Neg Hx    Malignant hyperthermia Neg Hx    Pseudochol deficiency Neg Hx    Social History   Socioeconomic History   Marital status: Married    Spouse name: Not on file   Number of children: Not on file   Years of education: Not on file   Highest education level: Not on file  Occupational History   Occupation: retired Building services engineer  Tobacco Use   Smoking status: Former Smoker   Smokeless tobacco: Never Used   Tobacco comment: in the 60's quit  Substance and Sexual Activity   Alcohol use: No    Alcohol/week: 0.0 standard drinks   Drug use: No   Sexual activity: Yes  Other Topics Concern   Not on file  Social History Narrative   Regular  Exercise-yes: tennis, work outs   Social Determinants of Corporate investment banker Strain: Low Risk    Difficulty of Paying Living Expenses:  Not hard at all  Food Insecurity: No Food Insecurity   Worried About Programme researcher, broadcasting/film/video in the Last Year: Never true   Barista in the Last Year: Never true  Transportation Needs: No Transportation Needs   Lack of Transportation (Medical): No   Lack of Transportation (Non-Medical): No  Physical Activity: Sufficiently Active   Days of Exercise per Week: 5 days   Minutes of Exercise per Session: 30 min  Stress: No Stress Concern Present   Feeling of Stress : Not at all  Social Connections: Socially Integrated   Frequency of Communication with Friends and Family: More than three times a week   Frequency of Social Gatherings with Friends and Family: Once a week   Attends Religious Services: More than 4 times  per year   Active Member of Clubs or Organizations: Yes   Attends Archivist Meetings: 1 to 4 times per year   Marital Status: Married    Tobacco Counseling Counseling given: Not Answered Comment: in the 60's quit   Clinical Intake:  Pre-visit preparation completed: Yes  Pain : No/denies pain     Nutritional Risks: None Diabetes: No  How often do you need to have someone help you when you read instructions, pamphlets, or other written materials from your doctor or pharmacy?: 1 - Never What is the last grade level you completed in school?: Associates Degree  Diabetic? no  Interpreter Needed?: No  Information entered by :: Lisette Abu, LPN   Activities of Daily Living In your present state of health, do you have any difficulty performing the following activities: 06/18/2020 11/11/2019  Hearing? N N  Vision? N N  Difficulty concentrating or making decisions? N N  Walking or climbing stairs? Y Y  Dressing or bathing? N Y  Doing errands, shopping? N N  Preparing Food and eating ? N -  Using the Toilet? N -  In the past six months, have you accidently leaked urine? N -  Do you have problems with loss of bowel control? N -  Managing your  Medications? N -  Managing your Finances? N -  Housekeeping or managing your Housekeeping? N -  Some recent data might be hidden    Patient Care Team: Plotnikov, Evie Lacks, MD as PCP - General Wynetta Emery Ursula Alert, MD (Gastroenterology) Karie Chimera, MD as Attending Physician (Neurosurgery)  Indicate any recent Medical Services you may have received from other than [facility] providers in the past year (date may be approximate).     Assessment:   This is a routine wellness examination for Francis Sims.  Hearing/Vision screen No exam data present  Dietary issues and exercise activities discussed: Current Exercise Habits: Home exercise routine;Structured exercise class, Type of exercise: walking;treadmill;stretching;strength training/weights, Time (Minutes): 30, Frequency (Times/Week): 5, Weekly Exercise (Minutes/Week): 150, Intensity: Moderate, Exercise limited by: cardiac condition(s)  Goals   None    Depression Screen PHQ 2/9 Scores 06/18/2020 01/16/2020 01/16/2020 01/16/2020 11/23/2018 09/03/2016 03/20/2016  PHQ - 2 Score 0 0 0 0 0 0 0  PHQ- 9 Score - 0 0 - - - -    Fall Risk Fall Risk  06/18/2020 01/22/2020 01/16/2020 11/23/2018 11/01/2017  Falls in the past year? 1 1 1  0 Yes  Number falls in past yr: 1 1 1  - 1  Injury with Fall? 0 0 1 - Yes  Risk for fall due to : Impaired balance/gait;History of fall(s) - - - -  Follow up Falls evaluation completed - - Falls evaluation completed Falls evaluation completed    [place]:  Any stairs in or around the home? Yes  If so, are there any without handrails? No  Home free of loose throw rugs in walkways, pet beds, electrical cords, etc? Yes  Adequate lighting in your home to reduce risk of falls? Yes   ASSISTIVE DEVICES UTILIZED TO PREVENT FALLS:  Life alert? No  Use of a cane, walker or w/c? No  Grab bars in the bathroom? Yes  Shower chair or bench in shower? Yes  Elevated toilet seat or a handicapped toilet? Yes    TIMED UP AND GO:  Was the test performed? No .  Length of time to ambulate 10 feet: 0 sec.   Gait steady and fast  with assistive device  Cognitive Function:        Immunizations Immunization History  Administered Date(s) Administered   Influenza, High Dose Seasonal PF 03/12/2018, 02/14/2020   PFIZER SARS-COV-2 Vaccination 07/07/2019, 07/28/2019   Pneumococcal Conjugate-13 05/05/2013   Pneumococcal Polysaccharide-23 04/24/2008   Tdap 11/29/2012   Zoster 11/28/2010    TDAP status: Up to date  Flu Vaccine status: Up to date  Pneumococcal vaccine status: Up to date  Covid-19 vaccine status: Completed vaccines  Qualifies for Shingles Vaccine? Yes   Zostavax completed Yes   Shingrix Completed?: No.    Education has been provided regarding the importance of this vaccine. Patient has been advised to call insurance company to determine out of pocket expense if they have not yet received this vaccine. Advised may also receive vaccine at local pharmacy or Health Dept. Verbalized acceptance and understanding.  Screening Tests Health Maintenance  Topic Date Due   Hepatitis C Screening  Never done   COVID-19 Vaccine (3 - Booster for Pfizer series) 01/25/2020   TETANUS/TDAP  11/30/2022   INFLUENZA VACCINE  Completed   PNA vac Low Risk Adult  Completed    Health Maintenance  Health Maintenance Due  Topic Date Due   Hepatitis C Screening  Never done   COVID-19 Vaccine (3 - Booster for Pfizer series) 01/25/2020    Colorectal cancer screening: Type of screening: Colonoscopy. Completed 06/26/2014. Repeat every 5-10 years  Lung Cancer Screening: (Low Dose CT Chest recommended if Age 28-80 years, 30 pack-year currently smoking OR have quit w/in 15years.) does qualify.   Lung Cancer Screening Referral: no  Additional Screening:  Hepatitis C Screening: does qualify; Completed no  Vision Screening: Recommended annual ophthalmology exams for early detection of glaucoma and  other disorders of the eye. Is the patient up to date with their annual eye exam?  Yes  Who is the provider or what is the name of the office in which the patient attends annual eye exams? Eula Flax, OD If pt is not established with a provider, would they like to be referred to a provider to establish care? No .   Dental Screening: Recommended annual dental exams for proper oral hygiene  Community Resource Referral / Chronic Care Management: CRR required this visit?  No   CCM required this visit?  No      Plan:     I have personally reviewed and noted the following in the patient's chart:   Medical and social history Use of alcohol, tobacco or illicit drugs  Current medications and supplements Functional ability and status Nutritional status Physical activity Advanced directives List of other physicians Hospitalizations, surgeries, and ER visits in previous 12 months Vitals Screenings to include cognitive, depression, and falls Referrals and appointments  In addition, I have reviewed and discussed with patient certain preventive protocols, quality metrics, and best practice recommendations. A written personalized care plan for preventive services as well as general preventive health recommendations were provided to patient.     Sheral Flow, LPN   [id number]   Nurse Notes:  Patient is cogitatively intact. There were no vitals filed for this visit. There is no height or weight on file to calculate BMI.  Medical screening examination/treatment/procedure(s) were performed by non-physician practitioner and as supervising physician I was immediately available for consultation/collaboration.  I agree with above. Lew Dawes, MD

## 2020-06-19 ENCOUNTER — Telehealth: Payer: Self-pay | Admitting: Internal Medicine

## 2020-06-19 NOTE — Telephone Encounter (Signed)
LVM for pt to rtn my call to schedule AWV with [facility]. Please schedule this appt if pt calls the office.  °

## 2020-07-17 ENCOUNTER — Encounter: Payer: Self-pay | Admitting: Internal Medicine

## 2020-07-17 ENCOUNTER — Ambulatory Visit (INDEPENDENT_AMBULATORY_CARE_PROVIDER_SITE_OTHER): Payer: Medicare HMO | Admitting: Internal Medicine

## 2020-07-17 ENCOUNTER — Other Ambulatory Visit: Payer: Self-pay

## 2020-07-17 DIAGNOSIS — B001 Herpesviral vesicular dermatitis: Secondary | ICD-10-CM

## 2020-07-17 DIAGNOSIS — R634 Abnormal weight loss: Secondary | ICD-10-CM

## 2020-07-17 DIAGNOSIS — R69 Illness, unspecified: Secondary | ICD-10-CM | POA: Diagnosis not present

## 2020-07-17 DIAGNOSIS — J310 Chronic rhinitis: Secondary | ICD-10-CM | POA: Diagnosis not present

## 2020-07-17 DIAGNOSIS — R5383 Other fatigue: Secondary | ICD-10-CM | POA: Diagnosis not present

## 2020-07-17 DIAGNOSIS — F4323 Adjustment disorder with mixed anxiety and depressed mood: Secondary | ICD-10-CM | POA: Diagnosis not present

## 2020-07-17 DIAGNOSIS — I63511 Cerebral infarction due to unspecified occlusion or stenosis of right middle cerebral artery: Secondary | ICD-10-CM

## 2020-07-17 LAB — TSH: TSH: 1.74 u[IU]/mL (ref 0.35–4.50)

## 2020-07-17 LAB — CBC WITH DIFFERENTIAL/PLATELET
Basophils Absolute: 0 10*3/uL (ref 0.0–0.1)
Basophils Relative: 0.5 % (ref 0.0–3.0)
Eosinophils Absolute: 0.1 10*3/uL (ref 0.0–0.7)
Eosinophils Relative: 1.1 % (ref 0.0–5.0)
HCT: 42.5 % (ref 39.0–52.0)
Hemoglobin: 14.4 g/dL (ref 13.0–17.0)
Lymphocytes Relative: 23.6 % (ref 12.0–46.0)
Lymphs Abs: 2 10*3/uL (ref 0.7–4.0)
MCHC: 33.9 g/dL (ref 30.0–36.0)
MCV: 81.8 fl (ref 78.0–100.0)
Monocytes Absolute: 0.8 10*3/uL (ref 0.1–1.0)
Monocytes Relative: 8.9 % (ref 3.0–12.0)
Neutro Abs: 5.6 10*3/uL (ref 1.4–7.7)
Neutrophils Relative %: 65.9 % (ref 43.0–77.0)
Platelets: 245 10*3/uL (ref 150.0–400.0)
RBC: 5.19 Mil/uL (ref 4.22–5.81)
RDW: 14 % (ref 11.5–15.5)
WBC: 8.5 10*3/uL (ref 4.0–10.5)

## 2020-07-17 LAB — COMPREHENSIVE METABOLIC PANEL
ALT: 18 U/L (ref 0–53)
AST: 17 U/L (ref 0–37)
Albumin: 4.3 g/dL (ref 3.5–5.2)
Alkaline Phosphatase: 77 U/L (ref 39–117)
BUN: 27 mg/dL — ABNORMAL HIGH (ref 6–23)
CO2: 28 mEq/L (ref 19–32)
Calcium: 9.5 mg/dL (ref 8.4–10.5)
Chloride: 104 mEq/L (ref 96–112)
Creatinine, Ser: 1.09 mg/dL (ref 0.40–1.50)
GFR: 65.45 mL/min (ref >60.00)
Glucose, Bld: 195 mg/dL — ABNORMAL HIGH (ref 70–99)
Potassium: 4.1 mEq/L (ref 3.5–5.1)
Sodium: 138 mEq/L (ref 135–145)
Total Bilirubin: 1.1 mg/dL (ref 0.2–1.2)
Total Protein: 7.4 g/dL (ref 6.0–8.3)

## 2020-07-17 LAB — T4, FREE: Free T4: 0.98 ng/dL (ref 0.60–1.60)

## 2020-07-17 MED ORDER — VALACYCLOVIR HCL 500 MG PO TABS: 500.0000 mg | ORAL_TABLET | Freq: Two times a day (BID) | ORAL | 2 refills | Status: DC

## 2020-07-17 MED ORDER — METOPROLOL TARTRATE 25 MG PO TABS: 12.5000 mg | ORAL_TABLET | Freq: Two times a day (BID) | ORAL | 3 refills | Status: DC

## 2020-07-17 NOTE — Patient Instructions (Signed)
Reduce Metoprolol to 1/2 tablet twice a day

## 2020-07-17 NOTE — Assessment & Plan Note (Addendum)
SBP 120-130 at home Cont w/Plavix

## 2020-07-17 NOTE — Progress Notes (Signed)
Subjective:  Patient ID: Francis Ade., male    DOB: 31-Dec-1940  Age: 80 y.o. MRN: [id number]  CC: Follow-up (3 month f/u)   HPI Francis Ade. presents for CVA, HTN. SBP 120-130 at home C/o fatigue ?due to meds - ?metoprolol - he felt better when he missed to take it C/o ulcers in the mouth - painful x 3 episodes  Outpatient Medications Prior to Visit  Medication Sig Dispense Refill  . amLODipine (NORVASC) 2.5 MG tablet Take 1 tablet (2.5 mg total) by mouth daily. 90 tablet 3  . cholecalciferol (VITAMIN D) 1000 UNITS tablet Take 1,000 Units by mouth daily.    . clopidogrel (PLAVIX) 75 MG tablet Take 1 tablet (75 mg total) by mouth daily. 90 tablet 3  . hydrOXYzine (ATARAX/VISTARIL) 25 MG tablet Take 1 tablet (25 mg total) by mouth at bedtime as needed for itching. 90 tablet 1  . LORazepam (ATIVAN) 0.5 MG tablet Take 1 tablet (0.5 mg total) by mouth 2 (two) times daily as needed for anxiety. 60 tablet 2  . losartan (COZAAR) 50 MG tablet Take 1 tablet (50 mg total) by mouth daily. 90 tablet 3  . metoprolol tartrate (LOPRESSOR) 25 MG tablet Take 25 mg by mouth 2 (two) times daily.    . Omega-3 Fatty Acids (FISH OIL) 1000 MG CAPS Take 1 capsule by mouth 2 (two) times daily.    Marland Kitchen omeprazole (PRILOSEC) 40 MG capsule TAKE ONE CAPSULE BY MOUTH EVERY DAY AS NEEDED 90 capsule 1  . tamsulosin (FLOMAX) 0.4 MG CAPS capsule TAKE 1 CAPSULE BY MOUTH EVERY DAY 90 capsule 1  . vardenafil (LEVITRA) 20 MG tablet Take 1 tablet (20 mg total) by mouth daily as needed for erectile dysfunction. 90 tablet 1  . vitamin C (ASCORBIC ACID) 250 MG tablet Take 250 mg by mouth daily.    . cetirizine (ZYRTEC ALLERGY) 10 MG tablet Take 1 tablet (10 mg total) by mouth daily. (Patient taking differently: Take 10 mg by mouth daily as needed.) 30 tablet 11  . azithromycin (ZITHROMAX Z-PAK) 250 MG tablet As directed 6 tablet 0  . nortriptyline (PAMELOR) 10 MG capsule Take 1-2 capsules (10-20 mg total) by mouth at  bedtime. (Patient not taking: Reported on 07/17/2020) 180 capsule 1   No facility-administered medications prior to visit.    ROS: Review of Systems  Constitutional: Positive for fatigue. Negative for appetite change and unexpected weight change.  HENT: Positive for congestion, sinus pressure and sore throat. Negative for nosebleeds, sneezing and trouble swallowing.   Eyes: Negative for itching and visual disturbance.  Respiratory: Negative for cough.   Cardiovascular: Negative for chest pain, palpitations and leg swelling.  Gastrointestinal: Negative for abdominal distention, blood in stool, diarrhea and nausea.  Genitourinary: Negative for frequency and hematuria.  Musculoskeletal: Negative for back pain, gait problem, joint swelling and neck pain.  Skin: Negative for rash.  Neurological: Positive for weakness. Negative for dizziness, tremors and speech difficulty.  Psychiatric/Behavioral: Negative for agitation, dysphoric mood, sleep disturbance and suicidal ideas. The patient is not nervous/anxious.     Objective:  BP 140/72 (BP Location: Left Arm)   Pulse 80   Temp 98.5 F (36.9 C) (Oral)   Ht 6\' 2"  (1.88 m)   Wt 202 lb 9.6 oz (91.9 kg)   SpO2 97%   BMI 26.01 kg/m   BP Readings from Last 3 Encounters:  07/17/20 140/72  04/17/20 138/80  04/02/20 138/61    Wt Readings from  Last 3 Encounters:  07/17/20 202 lb 9.6 oz (91.9 kg)  04/17/20 202 lb 6.4 oz (91.8 kg)  03/25/20 198 lb (89.8 kg)    Physical Exam Constitutional:      General: He is not in acute distress.    Appearance: He is well-developed.     Comments: NAD  HENT:     Mouth/Throat:     Mouth: Oropharynx is clear and moist.  Eyes:     Conjunctiva/sclera: Conjunctivae normal.     Pupils: Pupils are equal, round, and reactive to light.  Neck:     Thyroid: No thyromegaly.     Vascular: No JVD.  Cardiovascular:     Rate and Rhythm: Normal rate and regular rhythm.     Pulses: Intact distal pulses.      Heart sounds: Normal heart sounds. No murmur heard. No friction rub. No gallop.   Pulmonary:     Effort: Pulmonary effort is normal. No respiratory distress.     Breath sounds: Normal breath sounds. No wheezing or rales.  Chest:     Chest wall: No tenderness.  Abdominal:     General: Bowel sounds are normal. There is no distension.     Palpations: Abdomen is soft. There is no mass.     Tenderness: There is no abdominal tenderness. There is no guarding or rebound.  Musculoskeletal:        General: No tenderness or edema. Normal range of motion.     Cervical back: Normal range of motion.  Lymphadenopathy:     Cervical: No cervical adenopathy.  Skin:    General: Skin is warm and dry.     Findings: No rash.  Neurological:     Mental Status: He is alert and oriented to person, place, and time.     Cranial Nerves: No cranial nerve deficit.     Motor: Weakness present. No abnormal muscle tone.     Coordination: He displays a negative Romberg sign. Coordination abnormal.     Gait: Gait abnormal.     Deep Tendon Reflexes: Reflexes are normal and symmetric.  Psychiatric:        Mood and Affect: Mood and affect normal.        Behavior: Behavior normal.        Thought Content: Thought content normal.        Judgment: Judgment normal.    L foot drop [place] - using a cane L inner  cheek ulcers x2  Lab Results  Component Value Date   WBC 10.3 03/25/2020   HGB 14.2 03/25/2020   HCT 42.2 03/25/2020   PLT 196 03/25/2020   GLUCOSE 121 (H) 03/25/2020   CHOL 185 11/10/2019   TRIG 78 11/10/2019   HDL 34 (L) 11/10/2019   LDLDIRECT 105.0 05/31/2019   LDLCALC 135 (H) 11/10/2019   ALT 23 03/25/2020   AST 26 03/25/2020   NA 139 03/25/2020   K 3.8 03/25/2020   CL 102 03/25/2020   CREATININE 0.93 03/25/2020   BUN 23 03/25/2020   CO2 27 03/25/2020   TSH 1.21 05/31/2019   PSA 0.30 05/31/2019   INR 1.0 11/09/2019   HGBA1C 6.7 (H) 11/09/2019    CT Head Wo Contrast  Result Date:  03/25/2020 CLINICAL DATA:  Head trauma, minor. Additional history provided: 3 falls yesterday. EXAM: CT HEAD WITHOUT CONTRAST TECHNIQUE: Contiguous axial images were obtained from the base of the skull through the vertex without intravenous contrast. COMPARISON:  CT angiogram head 11/09/2019. FINDINGS: Brain:  Stable, mild generalized cerebral atrophy. There is a 5 mm lacunar infarct within the right thalamocapsular junction which is new as compared to the prior examination of 11/09/2019 but otherwise age indeterminate (series 2, image 59) (series 4, image 74). Redemonstrated known small chronic infarcts within the left basal ganglia/subinsular white matter. There is no acute intracranial hemorrhage. No demarcated cortical infarct. No extra-axial fluid collection. No evidence of intracranial mass. No midline shift. Vascular: No hyperdense vessel.  Atherosclerotic calcifications. Skull: Normal. Negative for fracture or focal lesion. Sinuses/Orbits: Visualized orbits show no acute finding. Other: Moderate ethmoid sinus mucosal thickening. No significant mastoid effusion at the imaged levels. IMPRESSION: No acute posttraumatic intracranial findings. A 5 mm lacunar infarct within the right thalamocapsular junction is new as compared to the prior exam of 11/09/2019, but otherwise age-indeterminate. Redemonstrated known small chronic infarcts within the left basal ganglia/subinsular white matter. Stable, mild generalized cerebral atrophy. Moderate ethmoid sinus mucosal thickening. Electronically Signed   By: Kellie Simmering DO   On: 03/25/2020 14:43   MR BRAIN WO CONTRAST  Result Date: 03/25/2020 CLINICAL DATA:  Multiple recent falls. EXAM: MRI HEAD WITHOUT CONTRAST TECHNIQUE: Multiplanar, multiecho pulse sequences of the brain and surrounding structures were obtained without intravenous contrast. COMPARISON:  Brain MRI 11/09/2019 FINDINGS: Brain: No acute infarct, acute hemorrhage or extra-axial collection. Multifocal  hyperintense T2-weighted signal within the white matter. There is generalized atrophy without lobar predilection. No chronic microhemorrhage. Normal midline structures. Vascular: Normal flow voids. Skull and upper cervical spine: Normal marrow signal. Sinuses/Orbits: Negative. Other: None. IMPRESSION: 1. No acute intracranial abnormality. 2. Generalized atrophy and findings of chronic small vessel ischemic disease. Electronically Signed   By: Ulyses Jarred M.D.   On: 03/25/2020 19:04    Assessment & Plan:    [name], MD

## 2020-07-17 NOTE — Assessment & Plan Note (Signed)
Wt Readings from Last 3 Encounters:  07/17/20 202 lb 9.6 oz (91.9 kg)  04/17/20 202 lb 6.4 oz (91.8 kg)  03/25/20 198 lb (89.8 kg)

## 2020-07-17 NOTE — Assessment & Plan Note (Signed)
Lorazepam as needed  Potential benefits of a long term benzodiazepines  use as well as potential risks  and complications were explained to the patient and were aknowledged Francis Sims

## 2020-07-17 NOTE — Assessment & Plan Note (Signed)
Oral Valtrex prn

## 2020-07-17 NOTE — Assessment & Plan Note (Signed)
Discussed: fatigue ?due to meds - ?metoprolol - will reduce the dose or d/c

## 2020-07-17 NOTE — Assessment & Plan Note (Addendum)
Use Sinus rinse

## 2020-07-18 ENCOUNTER — Other Ambulatory Visit (INDEPENDENT_AMBULATORY_CARE_PROVIDER_SITE_OTHER): Payer: Medicare HMO

## 2020-07-18 DIAGNOSIS — R7309 Other abnormal glucose: Secondary | ICD-10-CM | POA: Diagnosis not present

## 2020-07-18 LAB — HEMOGLOBIN A1C: Hgb A1c MFr Bld: 7.3 % — ABNORMAL HIGH (ref 4.6–6.5)

## 2020-07-20 ENCOUNTER — Other Ambulatory Visit: Payer: Self-pay | Admitting: Internal Medicine

## 2020-07-20 MED ORDER — METFORMIN HCL 500 MG PO TABS: 500.0000 mg | ORAL_TABLET | Freq: Every day | ORAL | 3 refills | Status: DC

## 2020-07-31 ENCOUNTER — Other Ambulatory Visit: Payer: Self-pay | Admitting: Internal Medicine

## 2020-08-14 ENCOUNTER — Encounter: Payer: Self-pay | Admitting: Internal Medicine

## 2020-08-14 ENCOUNTER — Other Ambulatory Visit: Payer: Self-pay

## 2020-08-14 ENCOUNTER — Ambulatory Visit (INDEPENDENT_AMBULATORY_CARE_PROVIDER_SITE_OTHER): Payer: Medicare HMO | Admitting: Internal Medicine

## 2020-08-14 DIAGNOSIS — I6359 Cerebral infarction due to unspecified occlusion or stenosis of other cerebral artery: Secondary | ICD-10-CM

## 2020-08-14 DIAGNOSIS — I10 Essential (primary) hypertension: Secondary | ICD-10-CM

## 2020-08-14 DIAGNOSIS — G629 Polyneuropathy, unspecified: Secondary | ICD-10-CM | POA: Diagnosis not present

## 2020-08-14 DIAGNOSIS — R202 Paresthesia of skin: Secondary | ICD-10-CM

## 2020-08-14 DIAGNOSIS — Z8673 Personal history of transient ischemic attack (TIA), and cerebral infarction without residual deficits: Secondary | ICD-10-CM | POA: Diagnosis not present

## 2020-08-14 DIAGNOSIS — L03032 Cellulitis of left toe: Secondary | ICD-10-CM | POA: Diagnosis not present

## 2020-08-14 MED ORDER — DOXYCYCLINE HYCLATE 100 MG PO TABS: 100.0000 mg | ORAL_TABLET | Freq: Two times a day (BID) | ORAL | 1 refills | Status: DC

## 2020-08-14 MED ORDER — MUPIROCIN 2 % EX OINT: TOPICAL_OINTMENT | CUTANEOUS | 0 refills | Status: DC

## 2020-08-14 MED ORDER — PREGABALIN 50 MG PO CAPS: 50.0000 mg | ORAL_CAPSULE | Freq: Three times a day (TID) | ORAL | 5 refills | Status: DC

## 2020-08-14 NOTE — Progress Notes (Signed)
Subjective:  Patient ID: Francis Ade., male    DOB: April 19, 1941  Age: 80 y.o. MRN: [id number]  CC: Toe Pain (Pt states pain started in (L) big toe.. Now foot is swollen and leg is numb from the knee down)   HPI Francis Ade. presents for L big toe swelling pain C/o severe neuropathy pain - B feet  Outpatient Medications Prior to Visit  Medication Sig Dispense Refill  . amLODipine (NORVASC) 2.5 MG tablet Take 1 tablet (2.5 mg total) by mouth daily. 90 tablet 3  . cholecalciferol (VITAMIN D) 1000 UNITS tablet Take 1,000 Units by mouth daily.    . clopidogrel (PLAVIX) 75 MG tablet Take 1 tablet (75 mg total) by mouth daily. 90 tablet 3  . hydrOXYzine (ATARAX/VISTARIL) 25 MG tablet Take 1 tablet (25 mg total) by mouth at bedtime as needed for itching. 90 tablet 1  . LORazepam (ATIVAN) 0.5 MG tablet Take 1 tablet (0.5 mg total) by mouth 2 (two) times daily as needed for anxiety. 60 tablet 2  . losartan (COZAAR) 50 MG tablet Take 1 tablet (50 mg total) by mouth daily. 90 tablet 3  . metFORMIN (GLUCOPHAGE) 500 MG tablet Take 1 tablet (500 mg total) by mouth daily with breakfast. 90 tablet 3  . metoprolol tartrate (LOPRESSOR) 25 MG tablet Take 0.5 tablets (12.5 mg total) by mouth 2 (two) times daily. 60 tablet 3  . Omega-3 Fatty Acids (FISH OIL) 1000 MG CAPS Take 1 capsule by mouth 2 (two) times daily.    Marland Kitchen omeprazole (PRILOSEC) 40 MG capsule TAKE ONE CAPSULE BY MOUTH EVERY DAY AS NEEDED 90 capsule 1  . tamsulosin (FLOMAX) 0.4 MG CAPS capsule TAKE 1 CAPSULE BY MOUTH EVERY DAY 90 capsule 1  . valACYclovir (VALTREX) 500 MG tablet Take 1 tablet (500 mg total) by mouth 2 (two) times daily. 14 tablet 2  . vardenafil (LEVITRA) 20 MG tablet Take 1 tablet (20 mg total) by mouth daily as needed for erectile dysfunction. 90 tablet 1  . vitamin C (ASCORBIC ACID) 250 MG tablet Take 250 mg by mouth daily.    . cetirizine (ZYRTEC ALLERGY) 10 MG tablet Take 1 tablet (10 mg total) by mouth daily.  (Patient taking differently: Take 10 mg by mouth daily as needed.) 30 tablet 11   No facility-administered medications prior to visit.    ROS: Review of Systems  Constitutional: Negative for appetite change, fatigue and unexpected weight change.  HENT: Negative for congestion, nosebleeds, sneezing, sore throat and trouble swallowing.   Eyes: Negative for itching and visual disturbance.  Respiratory: Negative for cough.   Cardiovascular: Negative for chest pain, palpitations and leg swelling.  Gastrointestinal: Negative for abdominal distention, blood in stool, diarrhea and nausea.  Genitourinary: Negative for frequency and hematuria.  Musculoskeletal: Positive for gait problem. Negative for back pain, joint swelling and neck pain.  Skin: Negative for rash.  Neurological: Positive for weakness. Negative for dizziness, tremors and speech difficulty.  Psychiatric/Behavioral: Negative for agitation, dysphoric mood and sleep disturbance. The patient is not nervous/anxious.     Objective:  BP 139/78 (BP Location: Left Arm)   Pulse 71   Temp 99.3 F (37.4 C) (Oral)   SpO2 96%   BP Readings from Last 3 Encounters:  08/14/20 139/78  07/17/20 140/72  04/17/20 138/80    Wt Readings from Last 3 Encounters:  07/17/20 202 lb 9.6 oz (91.9 kg)  04/17/20 202 lb 6.4 oz (91.8 kg)  03/25/20 198 lb (  89.8 kg)    Physical Exam Constitutional:      General: He is not in acute distress.    Appearance: He is well-developed.     Comments: NAD  HENT:     Mouth/Throat:     Mouth: Oropharynx is clear and moist.  Eyes:     Conjunctiva/sclera: Conjunctivae normal.     Pupils: Pupils are equal, round, and reactive to light.  Neck:     Thyroid: No thyromegaly.     Vascular: No JVD.  Cardiovascular:     Rate and Rhythm: Normal rate and regular rhythm.     Pulses: Intact distal pulses.     Heart sounds: Normal heart sounds. No murmur heard. No friction rub. No gallop.   Pulmonary:     Effort:  Pulmonary effort is normal. No respiratory distress.     Breath sounds: Normal breath sounds. No wheezing or rales.  Chest:     Chest wall: No tenderness.  Abdominal:     General: Bowel sounds are normal. There is no distension.     Palpations: Abdomen is soft. There is no mass.     Tenderness: There is no abdominal tenderness. There is no guarding or rebound.  Musculoskeletal:        General: No tenderness or edema. Normal range of motion.     Cervical back: Normal range of motion.  Lymphadenopathy:     Cervical: No cervical adenopathy.  Skin:    General: Skin is warm and dry.     Findings: No rash.  Neurological:     Mental Status: He is alert and oriented to person, place, and time.     Cranial Nerves: No cranial nerve deficit.     Motor: Weakness present. No abnormal muscle tone.     Coordination: He displays a negative Romberg sign. Coordination normal.     Gait: Gait abnormal.     Deep Tendon Reflexes: Reflexes are normal and symmetric.  Psychiatric:        Mood and Affect: Mood and affect normal.        Behavior: Behavior normal.        Thought Content: Thought content normal.        Judgment: Judgment normal.    L big toe paronychia   Lab Results  Component Value Date   WBC 8.5 07/17/2020   HGB 14.4 07/17/2020   HCT 42.5 07/17/2020   PLT 245.0 07/17/2020   GLUCOSE 195 (H) 07/17/2020   CHOL 185 11/10/2019   TRIG 78 11/10/2019   HDL 34 (L) 11/10/2019   LDLDIRECT 105.0 05/31/2019   LDLCALC 135 (H) 11/10/2019   ALT 18 07/17/2020   AST 17 07/17/2020   NA 138 07/17/2020   K 4.1 07/17/2020   CL 104 07/17/2020   CREATININE 1.09 07/17/2020   BUN 27 (H) 07/17/2020   CO2 28 07/17/2020   TSH 1.74 07/17/2020   PSA 0.30 05/31/2019   INR 1.0 11/09/2019   HGBA1C 7.3 (H) 07/18/2020    CT Head Wo Contrast  Result Date: 03/25/2020 CLINICAL DATA:  Head trauma, minor. Additional history provided: 3 falls yesterday. EXAM: CT HEAD WITHOUT CONTRAST TECHNIQUE: Contiguous  axial images were obtained from the base of the skull through the vertex without intravenous contrast. COMPARISON:  CT angiogram head 11/09/2019. FINDINGS: Brain: Stable, mild generalized cerebral atrophy. There is a 5 mm lacunar infarct within the right thalamocapsular junction which is new as compared to the prior examination of 11/09/2019 but otherwise age  indeterminate (series 2, image 14) (series 4, image 35). Redemonstrated known small chronic infarcts within the left basal ganglia/subinsular white matter. There is no acute intracranial hemorrhage. No demarcated cortical infarct. No extra-axial fluid collection. No evidence of intracranial mass. No midline shift. Vascular: No hyperdense vessel.  Atherosclerotic calcifications. Skull: Normal. Negative for fracture or focal lesion. Sinuses/Orbits: Visualized orbits show no acute finding. Other: Moderate ethmoid sinus mucosal thickening. No significant mastoid effusion at the imaged levels. IMPRESSION: No acute posttraumatic intracranial findings. A 5 mm lacunar infarct within the right thalamocapsular junction is new as compared to the prior exam of 11/09/2019, but otherwise age-indeterminate. Redemonstrated known small chronic infarcts within the left basal ganglia/subinsular white matter. Stable, mild generalized cerebral atrophy. Moderate ethmoid sinus mucosal thickening. Electronically Signed   By: Kellie Simmering DO   On: 03/25/2020 14:43   MR BRAIN WO CONTRAST  Result Date: 03/25/2020 CLINICAL DATA:  Multiple recent falls. EXAM: MRI HEAD WITHOUT CONTRAST TECHNIQUE: Multiplanar, multiecho pulse sequences of the brain and surrounding structures were obtained without intravenous contrast. COMPARISON:  Brain MRI 11/09/2019 FINDINGS: Brain: No acute infarct, acute hemorrhage or extra-axial collection. Multifocal hyperintense T2-weighted signal within the white matter. There is generalized atrophy without lobar predilection. No chronic microhemorrhage. Normal  midline structures. Vascular: Normal flow voids. Skull and upper cervical spine: Normal marrow signal. Sinuses/Orbits: Negative. Other: None. IMPRESSION: 1. No acute intracranial abnormality. 2. Generalized atrophy and findings of chronic small vessel ischemic disease. Electronically Signed   By: Ulyses Jarred M.D.   On: 03/25/2020 19:04    Assessment & Plan:   There are no diagnoses linked to this encounter.   No orders of the defined types were placed in this encounter.    Follow-up: No follow-ups on file.  [name], MD

## 2020-08-14 NOTE — Patient Instructions (Signed)
Paronychia Paronychia is an infection of the skin. It happens near a fingernail or toenail. It may cause pain and swelling around the nail. In some cases, a fluid-filled bump (abscess) can form near or under the nail. Usually, this condition is not serious, and it clears up with treatment. Follow these instructions at home: Wound care  Keep the affected area clean.  Soak the fingers or toes in warm water as told by your doctor. You may be told to do this for 20 minutes, 2-3 times a day.  Keep the area dry when you are not soaking it.  Do not try to drain a fluid-filled bump on your own.  Follow instructions from your doctor about how to take care of the affected area. Make sure you: ? Wash your hands with soap and water before you change your bandage (dressing). If you cannot use soap and water, use hand sanitizer. ? Change your bandage as told by your doctor.  If you had a fluid-filled bump and your doctor drained it, check the area every day for signs of infection. Check for: ? Redness, swelling, or pain. ? Fluid or blood. ? Warmth. ? Pus or a bad smell. Medicines  Take over-the-counter and prescription medicines only as told by your doctor.  If you were prescribed an antibiotic medicine, take it as told by your doctor. Do not stop taking it even if you start to feel better.   General instructions  Avoid touching any chemicals.  Do not pick at the affected area. Prevention  To prevent this condition from happening again: ? Wear rubber gloves when putting your hands in water for washing dishes or other tasks. ? Wear gloves if your hands might touch cleaners or chemicals. ? Avoid injuring your nails or fingertips. ? Do not bite your nails or tear hangnails. ? Do not cut your nails very short. ? Do not cut the skin at the base and sides of the nail (cuticles). ? Use clean nail clippers or scissors when trimming nails. Contact a doctor if:  You feel worse.  You do not get  better.  You have more fluid, blood, or pus coming from the affected area.  Your finger or knuckle is swollen or is hard to move. Get help right away if you have:  A fever or chills.  Redness spreading from the affected area.  Pain in a joint or muscle. Summary  Paronychia is an infection of the skin. It happens near a fingernail or toenail.  This condition may cause pain and swelling around the nail.  Soak the fingers or toes in warm water as told by your doctor.  Usually, this condition is not serious, and it clears up with treatment. This information is not intended to replace advice given to you by your health care provider. Make sure you discuss any questions you have with your health care provider. Document Revised: 03/27/2020 Document Reviewed: 03/27/2020 Elsevier Patient Education  2021 [employer]  

## 2020-08-14 NOTE — Assessment & Plan Note (Addendum)
Lyrica 50 mg up to tid B LEs  L hemiparesis

## 2020-08-14 NOTE — Assessment & Plan Note (Addendum)
Doxy po Soaks in [employer] ref

## 2020-08-18 ENCOUNTER — Encounter: Payer: Self-pay | Admitting: Internal Medicine

## 2020-08-18 DIAGNOSIS — R202 Paresthesia of skin: Secondary | ICD-10-CM | POA: Insufficient documentation

## 2020-08-18 NOTE — Assessment & Plan Note (Signed)
  BP Readings from Last 3 Encounters:  08/14/20 139/78  07/17/20 140/72  04/17/20 138/80  On Norvasc

## 2020-08-18 NOTE — Assessment & Plan Note (Signed)
Cont w/exercise BP control On Plavix

## 2020-08-18 NOTE — Assessment & Plan Note (Addendum)
11/09/2019 L hemiparesis Try Lyrica

## 2020-08-29 ENCOUNTER — Other Ambulatory Visit: Payer: Self-pay | Admitting: Internal Medicine

## 2020-10-04 ENCOUNTER — Other Ambulatory Visit: Payer: Self-pay | Admitting: Internal Medicine

## 2020-10-17 ENCOUNTER — Ambulatory Visit (INDEPENDENT_AMBULATORY_CARE_PROVIDER_SITE_OTHER): Payer: Medicare HMO | Admitting: Internal Medicine

## 2020-10-17 ENCOUNTER — Encounter: Payer: Self-pay | Admitting: Internal Medicine

## 2020-10-17 ENCOUNTER — Other Ambulatory Visit: Payer: Self-pay

## 2020-10-17 VITALS — BP 144/70 | HR 77 | Temp 98.1°F | Ht 74.0 in | Wt 196.3 lb

## 2020-10-17 DIAGNOSIS — R202 Paresthesia of skin: Secondary | ICD-10-CM

## 2020-10-17 DIAGNOSIS — M25552 Pain in left hip: Secondary | ICD-10-CM

## 2020-10-17 DIAGNOSIS — R5383 Other fatigue: Secondary | ICD-10-CM | POA: Diagnosis not present

## 2020-10-17 DIAGNOSIS — R609 Edema, unspecified: Secondary | ICD-10-CM

## 2020-10-17 MED ORDER — METHYLPREDNISOLONE ACETATE 80 MG/ML IJ SUSP
80.0000 mg | Freq: Once | INTRAMUSCULAR | Status: AC
Start: 2020-10-17 — End: 2020-10-17
  Administered 2020-10-17: 80 mg via INTRAMUSCULAR

## 2020-10-17 MED ORDER — TRIAMTERENE-HCTZ 37.5-25 MG PO TABS: 0.5000 | ORAL_TABLET | Freq: Every day | ORAL | 3 refills | Status: DC

## 2020-10-17 MED ORDER — LIDOCAINE-EPINEPHRINE 2 %-1:100000 IJ SOLN
3.0000 mL | Freq: Once | INTRAMUSCULAR | Status: AC
  Administered 2020-10-17: 3 mL

## 2020-10-17 NOTE — Patient Instructions (Addendum)
Stop Metoprolol Start Triamt/HCTZ 0.5 tab a day   Postprocedure instructions :    A Band-Aid should be left on for 12 hours. Injection therapy is not a cure itself. It is used in conjunction with other modalities. You can use nonsteroidal anti-inflammatories like ibuprofen , hot and cold compresses. Rest is recommended in the next 24 hours. You need to report immediately  if fever, chills or any signs of infection develop.   Hip Bursitis  Hip bursitis is the inflammation of one or more bursae in the hip joint. Bursae are small fluid-filled sacs that absorb shock and prevent bones from rubbing against each other. Hip bursitis can cause mild to moderate pain, and symptoms often come and go over time. What are the causes? This condition results from increased friction between the hip bones and the tendons around the hip joint. This condition can happen if you:  Overuse your hip muscles.  Injure your hip.  Have weak buttocks muscles.  Have bone spurs.  Have an infection. In some cases, the cause may not be known. What increases the risk? You are more likely to develop this condition if:  You injured your hip previously or had hip surgery.  You have a medical condition, such as arthritis, gout, diabetes, or thyroid disease.  You have spine problems.  You have one leg that is shorter than the other.  You participate in athletic activities that include repetitive motion, like running.  You participate in sports where there is a risk of injury or falling, such as football, martial arts, or skiing. What are the signs or symptoms? Symptoms may come and go, and they often include:  Pain in the hip or groin area. Pain may get worse with movement.  Tenderness and swelling of the hip. In rare cases, the bursa may become infected. If this happens, you may get a fever, as well as warmth and redness in the hip area. How is this diagnosed? This condition may be diagnosed based  on:  Your symptoms.  Your medical history.  A physical exam.  Imaging tests, such as: ? X-rays to check your bones. ? MRI or ultrasound to check your tendons and muscles. ? Bone scan.  A biopsy to remove fluid from your inflamed bursa for testing. How is this treated? This condition is treated by resting, icing, applying pressure (compression), and raising (elevating) the injured area. This is called RICE treatment. In some cases, RICE treatment may not be enough to make your symptoms go away. Treatment may also include:  Taking medicine to help with swelling and pain.  Using crutches, a cane, or a walker to decrease the strain on your hip.  Getting a shot of cortisone medicine to help reduce swelling.  Taking other medicines if the bursa is infected.  Draining fluid out of the bursa to help relieve swelling.  Having surgery to remove a damaged or infected bursa. This is rare. Long-term treatment may include:  Physical therapy exercises for strength and flexibility.  Lifestyle changes, such as weight loss, to reduce the strain on the hip. Follow these instructions at home: Managing pain, stiffness, and swelling  If directed, put ice on the painful area. ? Put ice in a plastic bag. ? Place a towel between your skin and the bag. ? Leave the ice on for 20 minutes, 2-3 times a day.  Raise (elevate) your hip as much as you can without pain. To do this, put a pillow under your hips while you  lie down.  If directed, apply heat to the affected area as often as told by your health care provider. Use the heat source that your health care provider recommends, such as a moist heat pack or a heating pad. ? Place a towel between your skin and the heat source. ? Leave the heat on for 20-30 minutes. ? Remove the heat if your skin turns bright red. This is especially important if you are unable to feel pain, heat, or cold. You may have a greater risk of getting burned.       Activity  Do not use your hip to support your body weight until your health care provider says that you can. Use crutches, a cane, or a walker as told by your health care provider.  If the affected leg is one that you use to drive, ask your health care provider if it is safe to drive.  Rest and protect your hip as much as possible until your pain and swelling get better.  Return to your normal activities as told by your health care provider. Ask your health care provider what activities are safe for you.  Do exercises as told by your health care provider. General instructions  Take over-the-counter and prescription medicines only as told by your health care provider.  Gently massage and stretch your injured area as often as is comfortable.  Wear compression wraps only as told by your health care provider.  If one of your legs is shorter than the other, get fitted for a shoe insert or orthotic.  Maintain a healthy weight. Follow instructions from your health care provider for weight control. These may include dietary restrictions.  Keep all follow-up visits as told by your health care provider. This is important. How is this prevented?  Exercise regularly, as told by your health care provider.  Wear supportive footwear that is appropriate for your sport.  Warm up and stretch before being active. Cool down and stretch after being active.  Take breaks regularly from repetitive activity.  If an activity irritates your hip or causes pain, avoid the activity as much as possible.  Avoid sitting down for long periods at a time. Where to find more information  [employer]: orthoinfo.aaos.org Contact a health care provider if:  You have a fever.  You develop new symptoms.  You have trouble walking or doing everyday activities.  You have pain that gets worse or does not get better with medicine.  You develop red skin or a feeling of warmth in your  hip area. Get help right away if:  You cannot move your hip.  You have severe pain.  You cannot control the muscles in your feet. Summary  Hip bursitis is the inflammation of one or more bursae in the hip joint. Bursae are small fluid-filled sacs that absorb shock and prevent bones from rubbing against each other.  Hip bursitis can cause hip or groin pain, and symptoms often come and go over time.  This condition is often treated by resting, icing, applying pressure (compression), and raising (elevating) the injured area. Other treatments may be needed. This information is not intended to replace advice given to you by your health care provider. Make sure you discuss any questions you have with your health care provider. Document Revised: 04/03/2019 Document Reviewed: 02/07/2018 Elsevier Patient Education  2021 [employer].

## 2020-10-17 NOTE — Assessment & Plan Note (Signed)
Stop Metoprolol Start Triamt/HCTZ 0.5 tab a day

## 2020-10-17 NOTE — Assessment & Plan Note (Signed)
On Lyrica

## 2020-10-17 NOTE — Progress Notes (Addendum)
Subjective:  Patient ID: Francis Sims., male    DOB: [birth year]  Age: 80 y.o. MRN: [id number]  CC: Follow-up (3 month f/u)   HPI Francis Sims. presents for CVA, HTN, anxiety C/o stress w/ill wife; fatigue.Ed is emotionally upset -his wife was recently referred to hospice and was not expected to make it after the second bout of bowel obstruction.  However, she is better now. C/o severe L hip pain Follow-up on CVA, hypertension  Outpatient Medications Prior to Visit  Medication Sig Dispense Refill  . amLODipine (NORVASC) 2.5 MG tablet Take 1 tablet (2.5 mg total) by mouth daily. 90 tablet 3  . cholecalciferol (VITAMIN D) 1000 UNITS tablet Take 1,000 Units by mouth daily.    . clopidogrel (PLAVIX) 75 MG tablet TAKE 1 TABLET BY MOUTH EVERY DAY 90 tablet 3  . doxycycline (VIBRA-TABS) 100 MG tablet Take 1 tablet (100 mg total) by mouth 2 (two) times daily. 14 tablet 1  . hydrOXYzine (ATARAX/VISTARIL) 25 MG tablet TAKE 1 TABLET BY MOUTH AT BEDTIME AS NEEDED FOR ITCHING. 90 tablet 1  . LORazepam (ATIVAN) 0.5 MG tablet Take 1 tablet (0.5 mg total) by mouth 2 (two) times daily as needed for anxiety. 60 tablet 2  . losartan (COZAAR) 50 MG tablet Take 1 tablet (50 mg total) by mouth daily. 90 tablet 3  . metFORMIN (GLUCOPHAGE) 500 MG tablet Take 1 tablet (500 mg total) by mouth daily with breakfast. 90 tablet 3  . metoprolol tartrate (LOPRESSOR) 25 MG tablet Take 0.5 tablets (12.5 mg total) by mouth 2 (two) times daily. 60 tablet 3  . mupirocin ointment (BACTROBAN) 2 % On leg wound w/dressing change qd or bid 30 g 0  . Omega-3 Fatty Acids (FISH OIL) 1000 MG CAPS Take 1 capsule by mouth 2 (two) times daily.    Marland Kitchen omeprazole (PRILOSEC) 40 MG capsule TAKE ONE CAPSULE BY MOUTH EVERY DAY AS NEEDED 90 capsule 1  . pregabalin (LYRICA) 50 MG capsule Take 1 capsule (50 mg total) by mouth 3 (three) times daily. 90 capsule 5  . tamsulosin (FLOMAX) 0.4 MG CAPS capsule TAKE 1 CAPSULE BY MOUTH EVERY  DAY 90 capsule 1  . valACYclovir (VALTREX) 500 MG tablet Take 1 tablet (500 mg total) by mouth 2 (two) times daily. 14 tablet 2  . vardenafil (LEVITRA) 20 MG tablet Take 1 tablet (20 mg total) by mouth daily as needed for erectile dysfunction. 90 tablet 1  . vitamin C (ASCORBIC ACID) 250 MG tablet Take 250 mg by mouth daily.    . cetirizine (ZYRTEC ALLERGY) 10 MG tablet Take 1 tablet (10 mg total) by mouth daily. (Patient taking differently: Take 10 mg by mouth daily as needed.) 30 tablet 11   No facility-administered medications prior to visit.    ROS: Review of Systems  Constitutional: Positive for fatigue. Negative for appetite change and unexpected weight change.  HENT: Negative for congestion, nosebleeds, sneezing, sore throat and trouble swallowing.   Eyes: Negative for itching and visual disturbance.  Respiratory: Negative for cough.   Cardiovascular: Negative for chest pain, palpitations and leg swelling.  Gastrointestinal: Negative for abdominal distention, blood in stool, diarrhea and nausea.  Genitourinary: Negative for frequency and hematuria.  Musculoskeletal: Positive for gait problem. Negative for back pain, joint swelling and neck pain.  Skin: Negative for rash.  Neurological: Positive for weakness. Negative for dizziness, tremors and speech difficulty.  Hematological: Bruises/bleeds easily.  Psychiatric/Behavioral: Positive for dysphoric mood. Negative for agitation,  sleep disturbance and suicidal ideas. The patient is nervous/anxious.     Objective:  BP (!) 144/70 (BP Location: Left Arm)   Pulse 77   Temp 98.1 F (36.7 C) (Oral)   Ht 6\' 2"  (1.88 m)   Wt 196 lb 4.8 oz (89 kg)   SpO2 95%   BMI 25.20 kg/m   BP Readings from Last 3 Encounters:  10/17/20 (!) 144/70  08/14/20 139/78  07/17/20 140/72    Wt Readings from Last 3 Encounters:  10/17/20 196 lb 4.8 oz (89 kg)  07/17/20 202 lb 9.6 oz (91.9 kg)  04/17/20 202 lb 6.4 oz (91.8 kg)    Physical  Exam Constitutional:      General: He is not in acute distress.    Appearance: He is well-developed.     Comments: NAD  Eyes:     Conjunctiva/sclera: Conjunctivae normal.     Pupils: Pupils are equal, round, and reactive to light.  Neck:     Thyroid: No thyromegaly.     Vascular: No JVD.  Cardiovascular:     Rate and Rhythm: Normal rate and regular rhythm.     Heart sounds: Normal heart sounds. No murmur heard. No friction rub. No gallop.   Pulmonary:     Effort: Pulmonary effort is normal. No respiratory distress.     Breath sounds: Normal breath sounds. No wheezing or rales.  Chest:     Chest wall: No tenderness.  Abdominal:     General: Bowel sounds are normal. There is no distension.     Palpations: Abdomen is soft. There is no mass.     Tenderness: There is no abdominal tenderness. There is no guarding or rebound.  Musculoskeletal:        General: No tenderness. Normal range of motion.     Cervical back: Normal range of motion.  Lymphadenopathy:     Cervical: No cervical adenopathy.  Skin:    General: Skin is warm and dry.     Findings: No rash.  Neurological:     Mental Status: He is alert and oriented to person, place, and time.     Cranial Nerves: No cranial nerve deficit.     Motor: No abnormal muscle tone.     Coordination: Coordination normal.     Gait: Gait normal.     Deep Tendon Reflexes: Reflexes are normal and symmetric.  Psychiatric:        Behavior: Behavior normal.        Thought Content: Thought content normal.        Judgment: Judgment normal.   trace edema B Onycho R L hip is tender   Procedure Note :     Procedure : Joint Injection,  L  hip   Indication:  Trochanteric bursitis with refractory  chronic pain.   Risks including unsuccessful procedure , bleeding, infection, bruising, skin atrophy, "steroid flare-up" and others were explained to the patient in detail as well as the benefits. Informed consent was obtained verbally.  Tthe  patient was placed in a comfortable lateral decubitus position. The point of maximal tenderness was identified. Skin was prepped with Betadine and alcohol. Then, a 5 cc syringe with a 2 inch long 24-gauge needle was used for a bursa injection.. The needle was advanced  Into the bursa. I injected the bursa with 4 mL of 2% lidocaine and 80 mg of Depo-Medrol .  Band-Aid was applied.   Tolerated well. Complications: None. Good pain relief following the procedure.  Postprocedure instructions :    A Band-Aid should be left on for 12 hours. Injection therapy is not a cure itself. It is used in conjunction with other modalities. You can use nonsteroidal anti-inflammatories like ibuprofen , hot and cold compresses. Rest is recommended in the next 24 hours. You need to report immediately  if fever, chills or any signs of infection develop.    Lab Results  Component Value Date   WBC 8.5 07/17/2020   HGB 14.4 07/17/2020   HCT 42.5 07/17/2020   PLT 245.0 07/17/2020   GLUCOSE 195 (H) 07/17/2020   CHOL 185 11/10/2019   TRIG 78 11/10/2019   HDL 34 (L) 11/10/2019   LDLDIRECT 105.0 05/31/2019   LDLCALC 135 (H) 11/10/2019   ALT 18 07/17/2020   AST 17 07/17/2020   NA 138 07/17/2020   K 4.1 07/17/2020   CL 104 07/17/2020   CREATININE 1.09 07/17/2020   BUN 27 (H) 07/17/2020   CO2 28 07/17/2020   TSH 1.74 07/17/2020   PSA 0.30 05/31/2019   INR 1.0 11/09/2019   HGBA1C 7.3 (H) 07/18/2020    CT Head Wo Contrast  Result Date: 03/25/2020 CLINICAL DATA:  Head trauma, minor. Additional history provided: 3 falls yesterday. EXAM: CT HEAD WITHOUT CONTRAST TECHNIQUE: Contiguous axial images were obtained from the base of the skull through the vertex without intravenous contrast. COMPARISON:  CT angiogram head 11/09/2019. FINDINGS: Brain: Stable, mild generalized cerebral atrophy. There is a 5 mm lacunar infarct within the right thalamocapsular junction which is new as compared to the prior examination of  11/09/2019 but otherwise age indeterminate (series 2, image 49) (series 4, image 30). Redemonstrated known small chronic infarcts within the left basal ganglia/subinsular white matter. There is no acute intracranial hemorrhage. No demarcated cortical infarct. No extra-axial fluid collection. No evidence of intracranial mass. No midline shift. Vascular: No hyperdense vessel.  Atherosclerotic calcifications. Skull: Normal. Negative for fracture or focal lesion. Sinuses/Orbits: Visualized orbits show no acute finding. Other: Moderate ethmoid sinus mucosal thickening. No significant mastoid effusion at the imaged levels. IMPRESSION: No acute posttraumatic intracranial findings. A 5 mm lacunar infarct within the right thalamocapsular junction is new as compared to the prior exam of 11/09/2019, but otherwise age-indeterminate. Redemonstrated known small chronic infarcts within the left basal ganglia/subinsular white matter. Stable, mild generalized cerebral atrophy. Moderate ethmoid sinus mucosal thickening. Electronically Signed   By: Kellie Simmering DO   On: 03/25/2020 14:43   MR BRAIN WO CONTRAST  Result Date: 03/25/2020 CLINICAL DATA:  Multiple recent falls. EXAM: MRI HEAD WITHOUT CONTRAST TECHNIQUE: Multiplanar, multiecho pulse sequences of the brain and surrounding structures were obtained without intravenous contrast. COMPARISON:  Brain MRI 11/09/2019 FINDINGS: Brain: No acute infarct, acute hemorrhage or extra-axial collection. Multifocal hyperintense T2-weighted signal within the white matter. There is generalized atrophy without lobar predilection. No chronic microhemorrhage. Normal midline structures. Vascular: Normal flow voids. Skull and upper cervical spine: Normal marrow signal. Sinuses/Orbits: Negative. Other: None. IMPRESSION: 1. No acute intracranial abnormality. 2. Generalized atrophy and findings of chronic small vessel ischemic disease. Electronically Signed   By: Ulyses Jarred M.D.   On: 03/25/2020  19:04    Assessment & Plan:    Follow-up: No follow-ups on file.  [name], MD

## 2020-10-17 NOTE — Assessment & Plan Note (Signed)
Stop Metoprolol Start Triamt/HCTZ 0.5 tab a day 

## 2020-10-19 ENCOUNTER — Encounter: Payer: Self-pay | Admitting: Internal Medicine

## 2020-10-24 ENCOUNTER — Other Ambulatory Visit: Payer: Self-pay

## 2020-10-24 ENCOUNTER — Ambulatory Visit (INDEPENDENT_AMBULATORY_CARE_PROVIDER_SITE_OTHER): Payer: Medicare HMO

## 2020-10-24 ENCOUNTER — Ambulatory Visit (INDEPENDENT_AMBULATORY_CARE_PROVIDER_SITE_OTHER): Payer: Medicare HMO | Admitting: Internal Medicine

## 2020-10-24 ENCOUNTER — Encounter: Payer: Self-pay | Admitting: Internal Medicine

## 2020-10-24 VITALS — BP 144/72 | HR 82 | Temp 97.9°F | Wt 189.0 lb

## 2020-10-24 DIAGNOSIS — M25552 Pain in left hip: Secondary | ICD-10-CM | POA: Diagnosis not present

## 2020-10-24 DIAGNOSIS — Z9889 Other specified postprocedural states: Secondary | ICD-10-CM | POA: Diagnosis not present

## 2020-10-24 DIAGNOSIS — R69 Illness, unspecified: Secondary | ICD-10-CM | POA: Diagnosis not present

## 2020-10-24 DIAGNOSIS — F439 Reaction to severe stress, unspecified: Secondary | ICD-10-CM | POA: Diagnosis not present

## 2020-10-24 DIAGNOSIS — M5136 Other intervertebral disc degeneration, lumbar region: Secondary | ICD-10-CM | POA: Diagnosis not present

## 2020-10-24 DIAGNOSIS — M1612 Unilateral primary osteoarthritis, left hip: Secondary | ICD-10-CM | POA: Diagnosis not present

## 2020-10-24 DIAGNOSIS — M5416 Radiculopathy, lumbar region: Secondary | ICD-10-CM

## 2020-10-24 MED ORDER — HYDROCODONE-ACETAMINOPHEN 5-325 MG PO TABS: 1.0000 | ORAL_TABLET | Freq: Four times a day (QID) | ORAL | 0 refills | Status: DC | PRN

## 2020-10-24 MED ORDER — PREDNISONE 10 MG PO TABS: ORAL_TABLET | ORAL | 1 refills | Status: DC

## 2020-10-24 NOTE — Patient Instructions (Signed)
Sciatica  Sciatica is pain, weakness, tingling, or loss of feeling (numbness) along the sciatic nerve. The sciatic nerve starts in the lower back and goes down the back of each leg. Sciatica usually goes away on its own or with treatment. Sometimes, sciatica may come back (recur). What are the causes? This condition happens when the sciatic nerve is pinched or has pressure put on it. This may be the result of:  A disk in between the bones of the spine bulging out too far (herniated disk).  Changes in the spinal disks that occur with aging.  A condition that affects a muscle in the butt.  Extra bone growth near the sciatic nerve.  A break (fracture) of the area between your hip bones (pelvis).  Pregnancy.  Tumor. This is rare. What increases the risk? You are more likely to develop this condition if you:  Play sports that put pressure or stress on the spine.  Have poor strength and ease of movement (flexibility).  Have had a back injury in the past.  Have had back surgery.  Sit for long periods of time.  Do activities that involve bending or lifting over and over again.  Are very overweight (obese). What are the signs or symptoms? Symptoms can vary from mild to very bad. They may include:  Any of these problems in the lower back, leg, hip, or butt: ? Mild tingling, loss of feeling, or dull aches. ? Burning sensations. ? Sharp pains.  Loss of feeling in the back of the calf or the sole of the foot.  Leg weakness.  Very bad back pain that makes it hard to move. These symptoms may get worse when you cough, sneeze, or laugh. They may also get worse when you sit or stand for long periods of time. How is this treated? This condition often gets better without any treatment. However, treatment may include:  Changing or cutting back on physical activity when you have pain.  Doing exercises and stretching.  Putting ice or heat on the affected area.  Medicines that  help: ? To relieve pain and swelling. ? To relax your muscles.  Shots (injections) of medicines that help to relieve pain, irritation, and swelling.  Surgery. Follow these instructions at home: Medicines  Take over-the-counter and prescription medicines only as told by your doctor.  Ask your doctor if the medicine prescribed to you: ? Requires you to avoid driving or using heavy machinery. ? Can cause trouble pooping (constipation). You may need to take these steps to prevent or treat trouble pooping:  Drink enough fluids to keep your pee (urine) pale yellow.  Take over-the-counter or prescription medicines.  Eat foods that are high in fiber. These include beans, whole grains, and fresh fruits and vegetables.  Limit foods that are high in fat and sugar. These include fried or sweet foods. Managing pain  If told, put ice on the affected area. ? Put ice in a plastic bag. ? Place a towel between your skin and the bag. ? Leave the ice on for 20 minutes, 2-3 times a day.  If told, put heat on the affected area. Use the heat source that your doctor tells you to use, such as a moist heat pack or a heating pad. ? Place a towel between your skin and the heat source. ? Leave the heat on for 20-30 minutes. ? Remove the heat if your skin turns bright red. This is very important if you are unable to feel pain,   heat, or cold. You may have a greater risk of getting burned.      Activity  Return to your normal activities as told by your doctor. Ask your doctor what activities are safe for you.  Avoid activities that make your symptoms worse.  Take short rests during the day. ? When you rest for a long time, do some physical activity or stretching between periods of rest. ? Avoid sitting for a long time without moving. Get up and move around at least one time each hour.  Exercise and stretch regularly, as told by your doctor.  Do not lift anything that is heavier than 10 lb (4.5 kg)  while you have symptoms of sciatica. ? Avoid lifting heavy things even when you do not have symptoms. ? Avoid lifting heavy things over and over.  When you lift objects, always lift in a way that is safe for your body. To do this, you should: ? Bend your knees. ? Keep the object close to your body. ? Avoid twisting.   General instructions  Stay at a healthy weight.  Wear comfortable shoes that support your feet. Avoid wearing high heels.  Avoid sleeping on a mattress that is too soft or too hard. You might have less pain if you sleep on a mattress that is firm enough to support your back.  Keep all follow-up visits as told by your doctor. This is important. Contact a doctor if:  You have pain that: ? Wakes you up when you are sleeping. ? Gets worse when you lie down. ? Is worse than the pain you have had in the past. ? Lasts longer than 4 weeks.  You lose weight without trying. Get help right away if:  You cannot control when you pee (urinate) or poop (have a bowel movement).  You have weakness in any of these areas and it gets worse: ? Lower back. ? The area between your hip bones. ? Butt. ? Legs.  You have redness or swelling of your back.  You have a burning feeling when you pee. Summary  Sciatica is pain, weakness, tingling, or loss of feeling (numbness) along the sciatic nerve.  This condition happens when the sciatic nerve is pinched or has pressure put on it.  Sciatica can cause pain, tingling, or loss of feeling (numbness) in the lower back, legs, hips, and butt.  Treatment often includes rest, exercise, medicines, and putting ice or heat on the affected area. This information is not intended to replace advice given to you by your health care provider. Make sure you discuss any questions you have with your health care provider. Document Revised: 06/20/2018 Document Reviewed: 06/20/2018 Elsevier Patient Education  2021 [employer]  

## 2020-10-24 NOTE — Assessment & Plan Note (Signed)
Francis Sims is sick with ovarian cancer and recent small bowel obstruction.  She is doing better

## 2020-10-24 NOTE — Assessment & Plan Note (Signed)
L side - worse Prednisone 10 mg: take 4 tabs a day x 3 days; then 3 tabs a day x 4 days; then 2 tabs a day x 4 days, then 1 tab a day x 6 days, then stop. Take pc. Norco prn MRI LS spine if needed Use a cane

## 2020-10-24 NOTE — Progress Notes (Signed)
Subjective:  Patient ID: Francis Sims., male    DOB: 1940/08/16  Age: 80 y.o. MRN: [id number]  CC: Follow-up (Left hip pain...pt states is not better )   HPI Francis Sims. presents for L hip and L anterolateral thigh pain.  The pain is worse with getting up and after he would walk for 30 feet.  He has to stop and wait till the pain resolves.  Then he can resume walking.  He also noticed some weakness in the right leg.  There is no low back pain.  No fever.  No urinary symptoms.   Outpatient Medications Prior to Visit  Medication Sig Dispense Refill  . amLODipine (NORVASC) 2.5 MG tablet Take 1 tablet (2.5 mg total) by mouth daily. 90 tablet 3  . cholecalciferol (VITAMIN D) 1000 UNITS tablet Take 1,000 Units by mouth daily.    . clopidogrel (PLAVIX) 75 MG tablet TAKE 1 TABLET BY MOUTH EVERY DAY 90 tablet 3  . doxycycline (VIBRA-TABS) 100 MG tablet Take 1 tablet (100 mg total) by mouth 2 (two) times daily. 14 tablet 1  . hydrOXYzine (ATARAX/VISTARIL) 25 MG tablet TAKE 1 TABLET BY MOUTH AT BEDTIME AS NEEDED FOR ITCHING. 90 tablet 1  . LORazepam (ATIVAN) 0.5 MG tablet Take 1 tablet (0.5 mg total) by mouth 2 (two) times daily as needed for anxiety. 60 tablet 2  . losartan (COZAAR) 50 MG tablet Take 1 tablet (50 mg total) by mouth daily. 90 tablet 3  . metFORMIN (GLUCOPHAGE) 500 MG tablet Take 1 tablet (500 mg total) by mouth daily with breakfast. 90 tablet 3  . mupirocin ointment (BACTROBAN) 2 % On leg wound w/dressing change qd or bid 30 g 0  . Omega-3 Fatty Acids (FISH OIL) 1000 MG CAPS Take 1 capsule by mouth 2 (two) times daily.    Marland Kitchen omeprazole (PRILOSEC) 40 MG capsule TAKE ONE CAPSULE BY MOUTH EVERY DAY AS NEEDED 90 capsule 1  . pregabalin (LYRICA) 50 MG capsule Take 1 capsule (50 mg total) by mouth 3 (three) times daily. 90 capsule 5  . tamsulosin (FLOMAX) 0.4 MG CAPS capsule TAKE 1 CAPSULE BY MOUTH EVERY DAY 90 capsule 1  . triamterene-hydrochlorothiazide (MAXZIDE-25) 37.5-25  MG tablet Take 0.5 tablets by mouth daily. 45 tablet 3  . valACYclovir (VALTREX) 500 MG tablet Take 1 tablet (500 mg total) by mouth 2 (two) times daily. 14 tablet 2  . vardenafil (LEVITRA) 20 MG tablet Take 1 tablet (20 mg total) by mouth daily as needed for erectile dysfunction. 90 tablet 1  . vitamin C (ASCORBIC ACID) 250 MG tablet Take 250 mg by mouth daily.    . cetirizine (ZYRTEC ALLERGY) 10 MG tablet Take 1 tablet (10 mg total) by mouth daily. (Patient taking differently: Take 10 mg by mouth daily as needed.) 30 tablet 11   No facility-administered medications prior to visit.    ROS: Review of Systems  Constitutional: Negative for appetite change, fatigue and unexpected weight change.  HENT: Negative for congestion, nosebleeds, sneezing, sore throat and trouble swallowing.   Eyes: Negative for itching and visual disturbance.  Respiratory: Negative for cough.   Cardiovascular: Negative for chest pain, palpitations and leg swelling.  Gastrointestinal: Negative for abdominal distention, blood in stool, diarrhea and nausea.  Genitourinary: Negative for frequency and hematuria.  Musculoskeletal: Positive for gait problem. Negative for back pain, joint swelling and neck pain.  Skin: Negative for rash.  Neurological: Positive for weakness. Negative for dizziness, tremors and speech  difficulty.  Psychiatric/Behavioral: Negative for agitation, dysphoric mood and sleep disturbance. The patient is not nervous/anxious.     Objective:  BP (!) 144/72 (BP Location: Left Arm)   Pulse 82   Temp 97.9 F (36.6 C) (Oral)   Wt 189 lb (85.7 kg)   SpO2 95%   BMI 24.27 kg/m   BP Readings from Last 3 Encounters:  10/24/20 (!) 144/72  10/17/20 (!) 144/70  08/14/20 139/78    Wt Readings from Last 3 Encounters:  10/24/20 189 lb (85.7 kg)  10/17/20 196 lb 4.8 oz (89 kg)  07/17/20 202 lb 9.6 oz (91.9 kg)    Physical Exam Constitutional:      General: He is not in acute distress.     Appearance: He is well-developed.     Comments: NAD  Eyes:     Conjunctiva/sclera: Conjunctivae normal.     Pupils: Pupils are equal, round, and reactive to light.  Neck:     Thyroid: No thyromegaly.     Vascular: No JVD.  Cardiovascular:     Rate and Rhythm: Normal rate and regular rhythm.     Heart sounds: Normal heart sounds. No murmur heard. No friction rub. No gallop.   Pulmonary:     Effort: Pulmonary effort is normal. No respiratory distress.     Breath sounds: Normal breath sounds. No wheezing or rales.  Chest:     Chest wall: No tenderness.  Abdominal:     General: Bowel sounds are normal. There is no distension.     Palpations: Abdomen is soft. There is no mass.     Tenderness: There is no abdominal tenderness. There is no guarding or rebound.  Musculoskeletal:        General: Tenderness present. Normal range of motion.     Cervical back: Normal range of motion.  Lymphadenopathy:     Cervical: No cervical adenopathy.  Skin:    General: Skin is warm and dry.     Findings: No rash.  Neurological:     Mental Status: He is alert and oriented to person, place, and time.     Cranial Nerves: No cranial nerve deficit.     Motor: No abnormal muscle tone.     Coordination: Coordination normal.     Gait: Gait normal.     Deep Tendon Reflexes: Reflexes are normal and symmetric.  Psychiatric:        Behavior: Behavior normal.        Thought Content: Thought content normal.        Judgment: Judgment normal.    Antalgic gait Arthritic knees He is using a cane Straight leg elevation is negative bilaterally L hip - no pain w/ROM, no stiffness  Lab Results  Component Value Date   WBC 8.5 07/17/2020   HGB 14.4 07/17/2020   HCT 42.5 07/17/2020   PLT 245.0 07/17/2020   GLUCOSE 195 (H) 07/17/2020   CHOL 185 11/10/2019   TRIG 78 11/10/2019   HDL 34 (L) 11/10/2019   LDLDIRECT 105.0 05/31/2019   LDLCALC 135 (H) 11/10/2019   ALT 18 07/17/2020   AST 17 07/17/2020   NA 138  07/17/2020   K 4.1 07/17/2020   CL 104 07/17/2020   CREATININE 1.09 07/17/2020   BUN 27 (H) 07/17/2020   CO2 28 07/17/2020   TSH 1.74 07/17/2020   PSA 0.30 05/31/2019   INR 1.0 11/09/2019   HGBA1C 7.3 (H) 07/18/2020    CT Head Wo Contrast  Result Date: 03/25/2020  CLINICAL DATA:  Head trauma, minor. Additional history provided: 3 falls yesterday. EXAM: CT HEAD WITHOUT CONTRAST TECHNIQUE: Contiguous axial images were obtained from the base of the skull through the vertex without intravenous contrast. COMPARISON:  CT angiogram head 11/09/2019. FINDINGS: Brain: Stable, mild generalized cerebral atrophy. There is a 5 mm lacunar infarct within the right thalamocapsular junction which is new as compared to the prior examination of 11/09/2019 but otherwise age indeterminate (series 2, image 88) (series 4, image 27). Redemonstrated known small chronic infarcts within the left basal ganglia/subinsular white matter. There is no acute intracranial hemorrhage. No demarcated cortical infarct. No extra-axial fluid collection. No evidence of intracranial mass. No midline shift. Vascular: No hyperdense vessel.  Atherosclerotic calcifications. Skull: Normal. Negative for fracture or focal lesion. Sinuses/Orbits: Visualized orbits show no acute finding. Other: Moderate ethmoid sinus mucosal thickening. No significant mastoid effusion at the imaged levels. IMPRESSION: No acute posttraumatic intracranial findings. A 5 mm lacunar infarct within the right thalamocapsular junction is new as compared to the prior exam of 11/09/2019, but otherwise age-indeterminate. Redemonstrated known small chronic infarcts within the left basal ganglia/subinsular white matter. Stable, mild generalized cerebral atrophy. Moderate ethmoid sinus mucosal thickening. Electronically Signed   By: Kellie Simmering DO   On: 03/25/2020 14:43   MR BRAIN WO CONTRAST  Result Date: 03/25/2020 CLINICAL DATA:  Multiple recent falls. EXAM: MRI HEAD WITHOUT  CONTRAST TECHNIQUE: Multiplanar, multiecho pulse sequences of the brain and surrounding structures were obtained without intravenous contrast. COMPARISON:  Brain MRI 11/09/2019 FINDINGS: Brain: No acute infarct, acute hemorrhage or extra-axial collection. Multifocal hyperintense T2-weighted signal within the white matter. There is generalized atrophy without lobar predilection. No chronic microhemorrhage. Normal midline structures. Vascular: Normal flow voids. Skull and upper cervical spine: Normal marrow signal. Sinuses/Orbits: Negative. Other: None. IMPRESSION: 1. No acute intracranial abnormality. 2. Generalized atrophy and findings of chronic small vessel ischemic disease. Electronically Signed   By: Ulyses Jarred M.D.   On: 03/25/2020 19:04    Assessment & Plan:     Follow-up: No follow-ups on file.  [name], MD

## 2020-11-20 ENCOUNTER — Other Ambulatory Visit: Payer: Self-pay

## 2020-11-21 ENCOUNTER — Ambulatory Visit (INDEPENDENT_AMBULATORY_CARE_PROVIDER_SITE_OTHER): Payer: Medicare HMO | Admitting: Internal Medicine

## 2020-11-21 ENCOUNTER — Encounter: Payer: Self-pay | Admitting: Internal Medicine

## 2020-11-21 DIAGNOSIS — F4323 Adjustment disorder with mixed anxiety and depressed mood: Secondary | ICD-10-CM

## 2020-11-21 DIAGNOSIS — I69354 Hemiplegia and hemiparesis following cerebral infarction affecting left non-dominant side: Secondary | ICD-10-CM | POA: Diagnosis not present

## 2020-11-21 DIAGNOSIS — I10 Essential (primary) hypertension: Secondary | ICD-10-CM | POA: Diagnosis not present

## 2020-11-21 DIAGNOSIS — R69 Illness, unspecified: Secondary | ICD-10-CM | POA: Diagnosis not present

## 2020-11-21 DIAGNOSIS — K120 Recurrent oral aphthae: Secondary | ICD-10-CM | POA: Insufficient documentation

## 2020-11-21 DIAGNOSIS — L03032 Cellulitis of left toe: Secondary | ICD-10-CM | POA: Diagnosis not present

## 2020-11-21 MED ORDER — TRIAMCINOLONE ACETONIDE 0.1 % MT PSTE: PASTE | OROMUCOSAL | 12 refills | Status: DC

## 2020-11-21 MED ORDER — VALACYCLOVIR HCL 500 MG PO TABS: 500.0000 mg | ORAL_TABLET | Freq: Two times a day (BID) | ORAL | 1 refills | Status: DC

## 2020-11-21 MED ORDER — HYDROCODONE-ACETAMINOPHEN 5-325 MG PO TABS: 1.0000 | ORAL_TABLET | Freq: Four times a day (QID) | ORAL | 0 refills | Status: DC | PRN

## 2020-11-21 MED ORDER — HYDROXYZINE HCL 25 MG PO TABS: ORAL_TABLET | ORAL | 1 refills | Status: DC

## 2020-11-21 MED ORDER — LORAZEPAM 0.5 MG PO TABS: 0.5000 mg | ORAL_TABLET | Freq: Two times a day (BID) | ORAL | 2 refills | Status: DC | PRN

## 2020-11-21 MED ORDER — DOXYCYCLINE HYCLATE 100 MG PO TABS: 100.0000 mg | ORAL_TABLET | Freq: Two times a day (BID) | ORAL | 1 refills | Status: DC

## 2020-11-21 NOTE — Assessment & Plan Note (Addendum)
Recurrent Doxy po prescribed Soak in Epsom salt.  He will probably need to see podiatrist when he has time.

## 2020-11-21 NOTE — Progress Notes (Signed)
Subjective:  Patient ID: Francis Ade., male    DOB: [birth year]  Age: 80 y.o. MRN: [id number]  CC: Follow-up (4 week f/u)   HPI Francis M Burget Jr. presents for hip pain/sciatica - a lot better  C/o stress - Lovey Newcomer is at home - Hospice is coming today  C/o itching  C/o L big toe pain and swelling x 1 wk  C/o mouth ulcers x 2 wks  Outpatient Medications Prior to Visit  Medication Sig Dispense Refill   amLODipine (NORVASC) 2.5 MG tablet Take 1 tablet (2.5 mg total) by mouth daily. 90 tablet 3   cholecalciferol (VITAMIN D) 1000 UNITS tablet Take 1,000 Units by mouth daily.     clopidogrel (PLAVIX) 75 MG tablet TAKE 1 TABLET BY MOUTH EVERY DAY 90 tablet 3   doxycycline (VIBRA-TABS) 100 MG tablet Take 1 tablet (100 mg total) by mouth 2 (two) times daily. 14 tablet 1   HYDROcodone-acetaminophen (NORCO/VICODIN) 5-325 MG tablet Take 1 tablet by mouth every 6 (six) hours as needed for severe pain. 20 tablet 0   hydrOXYzine (ATARAX/VISTARIL) 25 MG tablet TAKE 1 TABLET BY MOUTH AT BEDTIME AS NEEDED FOR ITCHING. 90 tablet 1   LORazepam (ATIVAN) 0.5 MG tablet Take 1 tablet (0.5 mg total) by mouth 2 (two) times daily as needed for anxiety. 60 tablet 2   losartan (COZAAR) 50 MG tablet Take 1 tablet (50 mg total) by mouth daily. 90 tablet 3   metFORMIN (GLUCOPHAGE) 500 MG tablet Take 1 tablet (500 mg total) by mouth daily with breakfast. 90 tablet 3   mupirocin ointment (BACTROBAN) 2 % On leg wound w/dressing change qd or bid 30 g 0   Omega-3 Fatty Acids (FISH OIL) 1000 MG CAPS Take 1 capsule by mouth 2 (two) times daily.     omeprazole (PRILOSEC) 40 MG capsule TAKE ONE CAPSULE BY MOUTH EVERY DAY AS NEEDED 90 capsule 1   pregabalin (LYRICA) 50 MG capsule Take 1 capsule (50 mg total) by mouth 3 (three) times daily. 90 capsule 5   tamsulosin (FLOMAX) 0.4 MG CAPS capsule TAKE 1 CAPSULE BY MOUTH EVERY DAY 90 capsule 1   triamterene-hydrochlorothiazide (MAXZIDE-25) 37.5-25 MG tablet Take 0.5  tablets by mouth daily. 45 tablet 3   valACYclovir (VALTREX) 500 MG tablet Take 1 tablet (500 mg total) by mouth 2 (two) times daily. 14 tablet 2   vardenafil (LEVITRA) 20 MG tablet Take 1 tablet (20 mg total) by mouth daily as needed for erectile dysfunction. 90 tablet 1   vitamin C (ASCORBIC ACID) 250 MG tablet Take 250 mg by mouth daily.     cetirizine (ZYRTEC ALLERGY) 10 MG tablet Take 1 tablet (10 mg total) by mouth daily. (Patient taking differently: Take 10 mg by mouth daily as needed.) 30 tablet 11   predniSONE (DELTASONE) 10 MG tablet Prednisone 10 mg: take 4 tabs a day x 3 days; then 3 tabs a day x 4 days; then 2 tabs a day x 4 days, then 1 tab a day x 6 days, then stop. Take pc. (Patient not taking: Reported on 11/21/2020) 38 tablet 1   No facility-administered medications prior to visit.    ROS: Review of Systems  Constitutional:  Negative for appetite change, fatigue and unexpected weight change.  HENT:  Negative for congestion, nosebleeds, sneezing, sore throat and trouble swallowing.   Eyes:  Negative for itching and visual disturbance.  Respiratory:  Negative for cough.   Cardiovascular:  Negative for chest pain,  palpitations and leg swelling.  Gastrointestinal:  Negative for abdominal distention, blood in stool, diarrhea and nausea.  Genitourinary:  Negative for frequency and hematuria.  Musculoskeletal:  Positive for gait problem. Negative for back pain, joint swelling and neck pain.  Skin:  Negative for rash.  Neurological:  Negative for dizziness, tremors, speech difficulty and weakness.  Psychiatric/Behavioral:  Positive for dysphoric mood. Negative for agitation, sleep disturbance and suicidal ideas. The patient is nervous/anxious.    Objective:  BP 138/72 (BP Location: Left Arm)   Pulse 82   Temp 98.4 F (36.9 C) (Oral)   Ht 6\' 2"  (1.88 m)   Wt 187 lb (84.8 kg)   SpO2 97%   BMI 24.01 kg/m   BP Readings from Last 3 Encounters:  11/21/20 138/72  10/24/20 (!)  144/72  10/17/20 (!) 144/70    Wt Readings from Last 3 Encounters:  11/21/20 187 lb (84.8 kg)  10/24/20 189 lb (85.7 kg)  10/17/20 196 lb 4.8 oz (89 kg)    Physical Exam Constitutional:      General: He is not in acute distress.    Appearance: He is well-developed.     Comments: NAD  Eyes:     Conjunctiva/sclera: Conjunctivae normal.     Pupils: Pupils are equal, round, and reactive to light.  Neck:     Thyroid: No thyromegaly.     Vascular: No JVD.  Cardiovascular:     Rate and Rhythm: Normal rate and regular rhythm.     Heart sounds: Normal heart sounds. No murmur heard.   No friction rub. No gallop.  Pulmonary:     Effort: Pulmonary effort is normal. No respiratory distress.     Breath sounds: Normal breath sounds. No wheezing or rales.  Chest:     Chest wall: No tenderness.  Abdominal:     General: Bowel sounds are normal. There is no distension.     Palpations: Abdomen is soft. There is no mass.     Tenderness: There is no abdominal tenderness. There is no guarding or rebound.  Musculoskeletal:        General: No tenderness. Normal range of motion.     Cervical back: Normal range of motion.  Lymphadenopathy:     Cervical: No cervical adenopathy.  Skin:    General: Skin is warm and dry.     Findings: No rash.  Neurological:     Mental Status: He is alert and oriented to person, place, and time.     Cranial Nerves: No cranial nerve deficit.     Motor: Weakness present. No abnormal muscle tone.     Coordination: Coordination normal.     Gait: Gait abnormal.     Deep Tendon Reflexes: Reflexes are normal and symmetric.  Psychiatric:        Behavior: Behavior normal.        Thought Content: Thought content normal.        Judgment: Judgment normal.   sad   L big toe pain and swelling, redness   mouth ulcers x 1 - L lower gum and one of the tongue  Lab Results  Component Value Date   WBC 8.5 07/17/2020   HGB 14.4 07/17/2020   HCT 42.5 07/17/2020   PLT  245.0 07/17/2020   GLUCOSE 195 (H) 07/17/2020   CHOL 185 11/10/2019   TRIG 78 11/10/2019   HDL 34 (L) 11/10/2019   LDLDIRECT 105.0 05/31/2019   LDLCALC 135 (H) 11/10/2019   ALT 18 07/17/2020  AST 17 07/17/2020   NA 138 07/17/2020   K 4.1 07/17/2020   CL 104 07/17/2020   CREATININE 1.09 07/17/2020   BUN 27 (H) 07/17/2020   CO2 28 07/17/2020   TSH 1.74 07/17/2020   PSA 0.30 05/31/2019   INR 1.0 11/09/2019   HGBA1C 7.3 (H) 07/18/2020    CT Head Wo Contrast  Result Date: 03/25/2020 CLINICAL DATA:  Head trauma, minor. Additional history provided: 3 falls yesterday. EXAM: CT HEAD WITHOUT CONTRAST TECHNIQUE: Contiguous axial images were obtained from the base of the skull through the vertex without intravenous contrast. COMPARISON:  CT angiogram head 11/09/2019. FINDINGS: Brain: Stable, mild generalized cerebral atrophy. There is a 5 mm lacunar infarct within the right thalamocapsular junction which is new as compared to the prior examination of 11/09/2019 but otherwise age indeterminate (series 2, image 14) (series 4, image 16). Redemonstrated known small chronic infarcts within the left basal ganglia/subinsular white matter. There is no acute intracranial hemorrhage. No demarcated cortical infarct. No extra-axial fluid collection. No evidence of intracranial mass. No midline shift. Vascular: No hyperdense vessel.  Atherosclerotic calcifications. Skull: Normal. Negative for fracture or focal lesion. Sinuses/Orbits: Visualized orbits show no acute finding. Other: Moderate ethmoid sinus mucosal thickening. No significant mastoid effusion at the imaged levels. IMPRESSION: No acute posttraumatic intracranial findings. A 5 mm lacunar infarct within the right thalamocapsular junction is new as compared to the prior exam of 11/09/2019, but otherwise age-indeterminate. Redemonstrated known small chronic infarcts within the left basal ganglia/subinsular white matter. Stable, mild generalized cerebral  atrophy. Moderate ethmoid sinus mucosal thickening. Electronically Signed   By: Kellie Simmering DO   On: 03/25/2020 14:43   MR BRAIN WO CONTRAST  Result Date: 03/25/2020 CLINICAL DATA:  Multiple recent falls. EXAM: MRI HEAD WITHOUT CONTRAST TECHNIQUE: Multiplanar, multiecho pulse sequences of the brain and surrounding structures were obtained without intravenous contrast. COMPARISON:  Brain MRI 11/09/2019 FINDINGS: Brain: No acute infarct, acute hemorrhage or extra-axial collection. Multifocal hyperintense T2-weighted signal within the white matter. There is generalized atrophy without lobar predilection. No chronic microhemorrhage. Normal midline structures. Vascular: Normal flow voids. Skull and upper cervical spine: Normal marrow signal. Sinuses/Orbits: Negative. Other: None. IMPRESSION: 1. No acute intracranial abnormality. 2. Generalized atrophy and findings of chronic small vessel ischemic disease. Electronically Signed   By: Ulyses Jarred M.D.   On: 03/25/2020 19:04    Assessment & Plan:    [name], MD

## 2020-11-21 NOTE — Patient Instructions (Signed)
Paronychia Paronychia is an infection of the skin. It happens near a fingernail or toenail. It may cause pain and swelling around the nail. In some cases, a fluid-filled bump (abscess) can form near or under the nail. Usually, this condition is not serious, and it clears up with treatment. Follow these instructions at home: Wound care Keep the affected area clean. Soak the fingers or toes in warm water as told by your doctor. You may be told to do this for 20 minutes, 2-3 times a day. Keep the area dry when you are not soaking it. Do not try to drain a fluid-filled bump on your own. Follow instructions from your doctor about how to take care of the affected area. Make sure you: Wash your hands with soap and water before you change your bandage (dressing). If you cannot use soap and water, use hand sanitizer. Change your bandage as told by your doctor. If you had a fluid-filled bump and your doctor drained it, check the area every day for signs of infection. Check for: Redness, swelling, or pain. Fluid or blood. Warmth. Pus or a bad smell. Medicines Take over-the-counter and prescription medicines only as told by your doctor. If you were prescribed an antibiotic medicine, take it as told by your doctor. Do not stop taking it even if you start to feel better.   General instructions Avoid touching any chemicals. Do not pick at the affected area. Prevention To prevent this condition from happening again: Wear rubber gloves when putting your hands in water for washing dishes or other tasks. Wear gloves if your hands might touch cleaners or chemicals. Avoid injuring your nails or fingertips. Do not bite your nails or tear hangnails. Do not cut your nails very short. Do not cut the skin at the base and sides of the nail (cuticles). Use clean nail clippers or scissors when trimming nails. Contact a doctor if: You feel worse. You do not get better. You have more fluid, blood, or pus coming  from the affected area. Your finger or knuckle is swollen or is hard to move. Get help right away if you have: A fever or chills. Redness spreading from the affected area. Pain in a joint or muscle. Summary Paronychia is an infection of the skin. It happens near a fingernail or toenail. This condition may cause pain and swelling around the nail. Soak the fingers or toes in warm water as told by your doctor. Usually, this condition is not serious, and it clears up with treatment. This information is not intended to replace advice given to you by your health care provider. Make sure you discuss any questions you have with your health care provider. Document Revised: 03/27/2020 Document Reviewed: 03/27/2020 Elsevier Patient Education  2021 [employer].

## 2020-11-21 NOTE — Assessment & Plan Note (Addendum)
New.  They are very painful Prescribed Valtrex po Triamc - paste

## 2020-11-24 NOTE — Assessment & Plan Note (Signed)
Francis Sims is very stressed with send this terminal cancer.  They will meet with hospice today

## 2020-11-24 NOTE — Assessment & Plan Note (Signed)
Blood pressure is better.  Continue with amlodipine

## 2020-11-24 NOTE — Assessment & Plan Note (Signed)
And has been using a cane.  He has been under a lot of stress at home.  Continue with Plavix and blood pressure control

## 2020-12-12 DIAGNOSIS — L814 Other melanin hyperpigmentation: Secondary | ICD-10-CM | POA: Diagnosis not present

## 2020-12-12 DIAGNOSIS — L570 Actinic keratosis: Secondary | ICD-10-CM | POA: Diagnosis not present

## 2020-12-12 DIAGNOSIS — L821 Other seborrheic keratosis: Secondary | ICD-10-CM | POA: Diagnosis not present

## 2020-12-12 DIAGNOSIS — D0439 Carcinoma in situ of skin of other parts of face: Secondary | ICD-10-CM | POA: Diagnosis not present

## 2020-12-12 DIAGNOSIS — D2371 Other benign neoplasm of skin of right lower limb, including hip: Secondary | ICD-10-CM | POA: Diagnosis not present

## 2020-12-12 DIAGNOSIS — L723 Sebaceous cyst: Secondary | ICD-10-CM | POA: Diagnosis not present

## 2020-12-12 DIAGNOSIS — D225 Melanocytic nevi of trunk: Secondary | ICD-10-CM | POA: Diagnosis not present

## 2020-12-12 DIAGNOSIS — D1801 Hemangioma of skin and subcutaneous tissue: Secondary | ICD-10-CM | POA: Diagnosis not present

## 2020-12-12 DIAGNOSIS — D2272 Melanocytic nevi of left lower limb, including hip: Secondary | ICD-10-CM | POA: Diagnosis not present

## 2020-12-12 DIAGNOSIS — Z85828 Personal history of other malignant neoplasm of skin: Secondary | ICD-10-CM | POA: Diagnosis not present

## 2020-12-12 DIAGNOSIS — D2271 Melanocytic nevi of right lower limb, including hip: Secondary | ICD-10-CM | POA: Diagnosis not present

## 2021-01-01 ENCOUNTER — Other Ambulatory Visit: Payer: Self-pay | Admitting: Internal Medicine

## 2021-01-16 ENCOUNTER — Ambulatory Visit (INDEPENDENT_AMBULATORY_CARE_PROVIDER_SITE_OTHER): Payer: Medicare HMO | Admitting: Internal Medicine

## 2021-01-16 ENCOUNTER — Other Ambulatory Visit: Payer: Self-pay

## 2021-01-16 ENCOUNTER — Encounter: Payer: Self-pay | Admitting: Internal Medicine

## 2021-01-16 VITALS — BP 130/68 | HR 62 | Temp 98.2°F | Ht 74.0 in | Wt 184.0 lb

## 2021-01-16 DIAGNOSIS — I10 Essential (primary) hypertension: Secondary | ICD-10-CM

## 2021-01-16 DIAGNOSIS — R739 Hyperglycemia, unspecified: Secondary | ICD-10-CM | POA: Diagnosis not present

## 2021-01-16 DIAGNOSIS — Z8673 Personal history of transient ischemic attack (TIA), and cerebral infarction without residual deficits: Secondary | ICD-10-CM

## 2021-01-16 DIAGNOSIS — E785 Hyperlipidemia, unspecified: Secondary | ICD-10-CM

## 2021-01-16 DIAGNOSIS — R634 Abnormal weight loss: Secondary | ICD-10-CM

## 2021-01-16 DIAGNOSIS — R69 Illness, unspecified: Secondary | ICD-10-CM | POA: Diagnosis not present

## 2021-01-16 DIAGNOSIS — F4321 Adjustment disorder with depressed mood: Secondary | ICD-10-CM | POA: Diagnosis not present

## 2021-01-16 LAB — COMPREHENSIVE METABOLIC PANEL
ALT: 10 U/L (ref 0–53)
AST: 13 U/L (ref 0–37)
Albumin: 4.1 g/dL (ref 3.5–5.2)
Alkaline Phosphatase: 70 U/L (ref 39–117)
BUN: 29 mg/dL — ABNORMAL HIGH (ref 6–23)
CO2: 27 mEq/L (ref 19–32)
Calcium: 9.3 mg/dL (ref 8.4–10.5)
Chloride: 102 mEq/L (ref 96–112)
Creatinine, Ser: 1.02 mg/dL (ref 0.40–1.50)
GFR: 70.63 mL/min (ref >60.00)
Glucose, Bld: 113 mg/dL — ABNORMAL HIGH (ref 70–99)
Potassium: 4.1 mEq/L (ref 3.5–5.1)
Sodium: 139 mEq/L (ref 135–145)
Total Bilirubin: 0.9 mg/dL (ref 0.2–1.2)
Total Protein: 7.1 g/dL (ref 6.0–8.3)

## 2021-01-16 LAB — TSH: TSH: 1.14 u[IU]/mL (ref 0.35–5.50)

## 2021-01-16 LAB — HEMOGLOBIN A1C: Hgb A1c MFr Bld: 6.8 % — ABNORMAL HIGH (ref 4.6–6.5)

## 2021-01-16 MED ORDER — LORAZEPAM 0.5 MG PO TABS: 0.5000 mg | ORAL_TABLET | Freq: Two times a day (BID) | ORAL | 2 refills | Status: DC | PRN

## 2021-01-16 NOTE — Progress Notes (Signed)
Subjective:  Patient ID: [name], male    DOB: January 01, 1941  Age: 80 y.o. MRN: [id number]  CC: No chief complaint on file.   HPI [name] presents for CVA, grief, pain - depressed F/u HTN Eating frozen meals  Outpatient Medications Prior to Visit  Medication Sig Dispense Refill   amLODipine (NORVASC) 2.5 MG tablet Take 1 tablet (2.5 mg total) by mouth daily. 90 tablet 3   cholecalciferol (VITAMIN D) 1000 UNITS tablet Take 1,000 Units by mouth daily.     clopidogrel (PLAVIX) 75 MG tablet TAKE 1 TABLET BY MOUTH EVERY DAY 90 tablet 3   HYDROcodone-acetaminophen (NORCO/VICODIN) 5-325 MG tablet Take 1 tablet by mouth every 6 (six) hours as needed for severe pain. 60 tablet 0   hydrOXYzine (ATARAX/VISTARIL) 25 MG tablet TAKE 1 TABLET BY MOUTH AT BEDTIME AS NEEDED FOR ITCHING. 90 tablet 1   LORazepam (ATIVAN) 0.5 MG tablet Take 1 tablet (0.5 mg total) by mouth 2 (two) times daily as needed for anxiety. 60 tablet 2   losartan (COZAAR) 50 MG tablet Take 1 tablet (50 mg total) by mouth daily. 90 tablet 3   metFORMIN (GLUCOPHAGE) 500 MG tablet Take 1 tablet (500 mg total) by mouth daily with breakfast. 90 tablet 3   mupirocin ointment (BACTROBAN) 2 % On leg wound w/dressing change qd or bid 30 g 0   Omega-3 Fatty Acids (FISH OIL) 1000 MG CAPS Take 1 capsule by mouth 2 (two) times daily.     omeprazole (PRILOSEC) 40 MG capsule TAKE ONE CAPSULE BY MOUTH EVERY DAY AS NEEDED 90 capsule 1   pregabalin (LYRICA) 50 MG capsule Take 1 capsule (50 mg total) by mouth 3 (three) times daily. 90 capsule 5   tamsulosin (FLOMAX) 0.4 MG CAPS capsule TAKE 1 CAPSULE BY MOUTH EVERY DAY 90 capsule 1   triamcinolone (KENALOG) 0.1 % paste Use on gum ulcer qid 5 g 12   triamterene-hydrochlorothiazide (MAXZIDE-25) 37.5-25 MG tablet Take 0.5 tablets by mouth daily. 45 tablet 3   valACYclovir (VALTREX) 500 MG tablet Take 1 tablet (500 mg total) by mouth 2 (two) times daily. 14 tablet 1   vardenafil  (LEVITRA) 20 MG tablet Take 1 tablet (20 mg total) by mouth daily as needed for erectile dysfunction. 90 tablet 1   vitamin C (ASCORBIC ACID) 250 MG tablet Take 250 mg by mouth daily.     cetirizine (ZYRTEC ALLERGY) 10 MG tablet Take 1 tablet (10 mg total) by mouth daily. (Patient taking differently: Take 10 mg by mouth daily as needed.) 30 tablet 11   doxycycline (VIBRA-TABS) 100 MG tablet Take 1 tablet (100 mg total) by mouth 2 (two) times daily. 14 tablet 1   No facility-administered medications prior to visit.    ROS: Review of Systems  Constitutional:  Positive for appetite change and unexpected weight change. Negative for fatigue.  HENT:  Negative for congestion, nosebleeds, sneezing, sore throat and trouble swallowing.   Eyes:  Negative for itching and visual disturbance.  Respiratory:  Negative for cough.   Cardiovascular:  Negative for chest pain, palpitations and leg swelling.  Gastrointestinal:  Negative for abdominal distention, blood in stool, diarrhea and nausea.  Genitourinary:  Negative for frequency and hematuria.  Musculoskeletal:  Positive for gait problem. Negative for back pain, joint swelling and neck pain.  Skin:  Negative for rash.  Neurological:  Negative for dizziness, tremors, speech difficulty and weakness.  Psychiatric/Behavioral:  Positive for decreased concentration, dysphoric mood  and sleep disturbance. Negative for agitation, behavioral problems, confusion, self-injury and suicidal ideas. The patient is nervous/anxious.    Objective:  BP 130/68 (BP Location: Left Arm, Patient Position: Sitting, Cuff Size: Normal)   Pulse 62   Temp 98.2 F (36.8 C) (Oral)   Ht '6\' 2"'$  (1.88 m)   Wt 184 lb (83.5 kg)   SpO2 96%   BMI 23.62 kg/m   BP Readings from Last 3 Encounters:  01/16/21 130/68  11/21/20 138/72  10/24/20 (!) 144/72    Wt Readings from Last 3 Encounters:  01/16/21 184 lb (83.5 kg)  11/21/20 187 lb (84.8 kg)  10/24/20 189 lb (85.7 kg)     Physical Exam Constitutional:      General: He is not in acute distress.    Appearance: Normal appearance. He is well-developed.     Comments: NAD  Eyes:     Conjunctiva/sclera: Conjunctivae normal.     Pupils: Pupils are equal, round, and reactive to light.  Neck:     Thyroid: No thyromegaly.     Vascular: No JVD.  Cardiovascular:     Rate and Rhythm: Normal rate and regular rhythm.     Heart sounds: Normal heart sounds. No murmur heard.   No friction rub. No gallop.  Pulmonary:     Effort: Pulmonary effort is normal. No respiratory distress.     Breath sounds: Normal breath sounds. No wheezing or rales.  Chest:     Chest wall: No tenderness.  Abdominal:     General: Bowel sounds are normal. There is no distension.     Palpations: Abdomen is soft. There is no mass.     Tenderness: There is no abdominal tenderness. There is no guarding or rebound.  Musculoskeletal:        General: No tenderness. Normal range of motion.     Cervical back: Normal range of motion.  Lymphadenopathy:     Cervical: No cervical adenopathy.  Skin:    General: Skin is warm and dry.     Findings: No rash.  Neurological:     Mental Status: He is alert and oriented to person, place, and time.     Cranial Nerves: No cranial nerve deficit.     Motor: Weakness present. No abnormal muscle tone.     Coordination: Coordination normal.     Gait: Gait abnormal.     Deep Tendon Reflexes: Reflexes are normal and symmetric.  Psychiatric:        Behavior: Behavior normal.        Thought Content: Thought content normal.        Judgment: Judgment normal.  LLE - weak Using a cane Tearful  Lab Results  Component Value Date   WBC 8.5 07/17/2020   HGB 14.4 07/17/2020   HCT 42.5 07/17/2020   PLT 245.0 07/17/2020   GLUCOSE 195 (H) 07/17/2020   CHOL 185 11/10/2019   TRIG 78 11/10/2019   HDL 34 (L) 11/10/2019   LDLDIRECT 105.0 05/31/2019   LDLCALC 135 (H) 11/10/2019   ALT 18 07/17/2020   AST 17  07/17/2020   NA 138 07/17/2020   K 4.1 07/17/2020   CL 104 07/17/2020   CREATININE 1.09 07/17/2020   BUN 27 (H) 07/17/2020   CO2 28 07/17/2020   TSH 1.74 07/17/2020   PSA 0.30 05/31/2019   INR 1.0 11/09/2019   HGBA1C 7.3 (H) 07/18/2020    CT Head Wo Contrast  Result Date: 03/25/2020 CLINICAL DATA:  Head trauma, minor.  Additional history provided: 3 falls yesterday. EXAM: CT HEAD WITHOUT CONTRAST TECHNIQUE: Contiguous axial images were obtained from the base of the skull through the vertex without intravenous contrast. COMPARISON:  CT angiogram head 11/09/2019. FINDINGS: Brain: Stable, mild generalized cerebral atrophy. There is a 5 mm lacunar infarct within the right thalamocapsular junction which is new as compared to the prior examination of 11/09/2019 but otherwise age indeterminate (series 2, image 36) (series 4, image 46). Redemonstrated known small chronic infarcts within the left basal ganglia/subinsular white matter. There is no acute intracranial hemorrhage. No demarcated cortical infarct. No extra-axial fluid collection. No evidence of intracranial mass. No midline shift. Vascular: No hyperdense vessel.  Atherosclerotic calcifications. Skull: Normal. Negative for fracture or focal lesion. Sinuses/Orbits: Visualized orbits show no acute finding. Other: Moderate ethmoid sinus mucosal thickening. No significant mastoid effusion at the imaged levels. IMPRESSION: No acute posttraumatic intracranial findings. A 5 mm lacunar infarct within the right thalamocapsular junction is new as compared to the prior exam of 11/09/2019, but otherwise age-indeterminate. Redemonstrated known small chronic infarcts within the left basal ganglia/subinsular white matter. Stable, mild generalized cerebral atrophy. Moderate ethmoid sinus mucosal thickening. Electronically Signed   By: Kellie Simmering DO   On: 03/25/2020 14:43   MR BRAIN WO CONTRAST  Result Date: 03/25/2020 CLINICAL DATA:  Multiple recent falls.  EXAM: MRI HEAD WITHOUT CONTRAST TECHNIQUE: Multiplanar, multiecho pulse sequences of the brain and surrounding structures were obtained without intravenous contrast. COMPARISON:  Brain MRI 11/09/2019 FINDINGS: Brain: No acute infarct, acute hemorrhage or extra-axial collection. Multifocal hyperintense T2-weighted signal within the white matter. There is generalized atrophy without lobar predilection. No chronic microhemorrhage. Normal midline structures. Vascular: Normal flow voids. Skull and upper cervical spine: Normal marrow signal. Sinuses/Orbits: Negative. Other: None. IMPRESSION: 1. No acute intracranial abnormality. 2. Generalized atrophy and findings of chronic small vessel ischemic disease. Electronically Signed   By: Ulyses Jarred M.D.   On: 03/25/2020 19:04    Assessment & Plan:     [name], MD

## 2021-01-16 NOTE — Addendum Note (Signed)
Addended by: Boris Lown B on: 01/16/2021 10:36 AM   Modules accepted: Orders

## 2021-01-16 NOTE — Assessment & Plan Note (Addendum)
Francis Sims died in 2022 Starting therapy

## 2021-01-16 NOTE — Assessment & Plan Note (Signed)
Grief related

## 2021-01-16 NOTE — Assessment & Plan Note (Signed)
F/u Dr Dohmeier  [place] mushroom Continue with Plavix and blood pressure control

## 2021-01-23 DIAGNOSIS — Z7901 Long term (current) use of anticoagulants: Secondary | ICD-10-CM | POA: Insufficient documentation

## 2021-01-23 DIAGNOSIS — C031 Malignant neoplasm of lower gum: Secondary | ICD-10-CM | POA: Insufficient documentation

## 2021-01-23 DIAGNOSIS — Z8673 Personal history of transient ischemic attack (TIA), and cerebral infarction without residual deficits: Secondary | ICD-10-CM | POA: Diagnosis not present

## 2021-01-25 ENCOUNTER — Other Ambulatory Visit: Payer: Self-pay | Admitting: Internal Medicine

## 2021-02-10 ENCOUNTER — Telehealth: Payer: Self-pay

## 2021-02-10 NOTE — Assessment & Plan Note (Signed)
Try to eat better.  Ensure or boost

## 2021-02-10 NOTE — Assessment & Plan Note (Signed)
Amlodipine at the reduced dose

## 2021-02-10 NOTE — Telephone Encounter (Signed)
Patient having surgery Friday. He was told to follow up with [place] to see if he should stop plavix?   Please return patient call at [phone]

## 2021-02-10 NOTE — Telephone Encounter (Signed)
Yes. Stop/re-start Plavix per Routine surgical protocol for Dr Constance Holster. Thx

## 2021-02-11 NOTE — Telephone Encounter (Signed)
Notified pt w/MD response.../lmb 

## 2021-02-11 NOTE — H&P (Signed)
HPI:   Francis Sims is a 80 y.o. male who presents as a consult Patient.   Referring Provider: System, Prov Not In  Chief complaint: Oral cancer.  HPI: He was having some symptoms involving his left mandibular second molar. He had a biopsy which revealed verrucous cancer. He does not smoke or drink. Prior to last year when he had a stroke he was in perfect health and very active. He is currently anticoagulated.  PMH/Meds/All/SocHx/FamHx/ROS:   History reviewed. No pertinent past medical history.  Past Surgical History:  Procedure Laterality Date   BACK SURGERY   HERNIA REPAIR   NECK SURGERY   TONSILLECTOMY   No family history of bleeding disorders, wound healing problems or difficulty with anesthesia.   Social History   Socioeconomic History   Marital status: Married  Spouse name: Not on file   Number of children: Not on file   Years of education: Not on file   Highest education level: Not on file  Occupational History   Not on file  Tobacco Use   Smoking status: Former Smoker  Quit date: 1971  Years since quitting: 51.6   Smokeless tobacco: Never Used  Scientific laboratory technician Use: Never used  Substance and Sexual Activity   Alcohol use: No   Drug use: No   Sexual activity: Not on file  Other Topics Concern   Not on file  Social History Narrative   Not on file   Social Determinants of Health   Financial Resource Strain: Not on file  Food Insecurity: Not on file  Transportation Needs: Not on file  Physical Activity: Not on file  Stress: Not on file  Social Connections: Not on file  Housing Stability: Not on file   Current Outpatient Medications:   amLODIPine (NORVASC) 2.5 MG tablet, Take 2.5 mg by mouth daily., Disp: , Rfl:   clopidogreL (PLAVIX) 75 mg tablet *ANTIPLATELET*, Take 75 mg by mouth daily., Disp: , Rfl:   HYDROcodone-acetaminophen (NORCO) 5-325 mg per tablet, TAKE 1 TABLET BY MOUTH EVERY 6 (SIX) HOURS AS NEEDED FOR SEVERE PAIN., Disp: , Rfl:    hydrOXYzine HCL (ATARAX) 25 MG tablet, TAKE 1 TABLET BY MOUTH AT BEDTIME AS NEEDED FOR ITCHING., Disp: , Rfl:   LORazepam (ATIVAN) 0.5 MG tablet, TAKE 1 TABLET BY MOUTH 2 TIMES DAILY AS NEEDED FOR ANXIETY., Disp: , Rfl:   losartan (COZAAR) 50 MG tablet, Take 50 mg by mouth daily., Disp: , Rfl:   metFORMIN (GLUCOPHAGE) 500 MG tablet, Take 500 mg by mouth daily with breakfast., Disp: , Rfl:   predniSONE (DELTASONE) 10 MG tablet, PLEASE SEE ATTACHED FOR DETAILED DIRECTIONS, Disp: , Rfl:   tamsulosin (FLOMAX) 0.4 mg Cap capsule, Take 0.4 mg by mouth daily., Disp: , Rfl:   triamcinolone (KENALOG) 0.1 % paste, USE ON GUM ULCER 4 TIMES A DAY, Disp: , Rfl:   triamterene-hydrochlorothiazide (MAXZIDE-25) 37.5-25 mg per tablet, Take 0.5 tablets by mouth daily., Disp: , Rfl:   valACYclovir (VALTREX) 500 MG tablet, Take 500 mg by mouth 2 times daily., Disp: , Rfl:   A complete ROS was performed with pertinent positives/negatives noted in the HPI. The remainder of the ROS are negative.   Physical Exam:   BP 127/78  Pulse 84  Temp 97.2 F (36.2 C)  Ht 1.88 m ('6\' 2"'$ )  Wt 83 kg (183 lb)  BMI 23.50 kg/m   General: Healthy and alert, in no distress, breathing easily. Normal affect. In a pleasant mood. Head: Normocephalic,  atraumatic. No masses, or scars. Eyes: Pupils are equal, and reactive to light. Vision is grossly intact. No spontaneous or gaze nystagmus. Ears: Ear canals are clear. Tympanic membranes are intact, with normal landmarks and the middle ears are clear and healthy. Hearing: Grossly normal. Nose: Nasal cavities are clear with healthy mucosa, no polyps or exudate. Airways are patent. Face: No masses or scars, facial nerve function is symmetric. Oral Cavity: There is a thickening and leukoplakia lesion surrounding the posterior half of the left second molar. Third molar is absent. Tongue with normal mobility. Dentition otherwise appears healthy. Oropharynx: There are no mucosal masses  identified. Tongue base appears normal and healthy. Larynx/Hypopharynx: deferred Chest: Deferred Neck: No palpable masses, no cervical adenopathy, no thyroid nodules or enlargement. Neuro: Cranial nerves II-XII with normal function. Balance: He walks with a cane due to weakness of the left lower extremity. Other findings: none.  Independent Review of Additional Tests or Records:  none  Procedures:  none  Impression & Plans:  T1N0 verrucous cancer left mandibular alveolus. Recommend en bloc resection including the second molar. We will need cardiac clearance and will need to stop his Plavix first.

## 2021-02-11 NOTE — Progress Notes (Signed)
Surgical Instructions    Your procedure is scheduled on 02/14/21.  Report to [facility] Main Entrance "A" at 9:30 A.M., then check in with the Admitting office.  Call this number if you have problems the morning of surgery:  [phone]   If you have any questions prior to your surgery date call [phone]: Open Monday-Friday 8am-4pm    Remember:  Do not eat after midnight the night before your surgery  You may drink clear liquids until 8:30 am the morning of your surgery.   Clear liquids allowed are: Water, Non-Citrus Juices (without pulp), Carbonated Beverages, Clear Tea, Black Coffee ONLY (NO MILK, CREAM OR POWDERED CREAMER of any kind), and Gatorade. Drink diet or low sugar drinks only.    Take these medicines the morning of surgery with A SIP OF WATER:  amLODipine (NORVASC) hydrOXYzine (ATARAX/VISTARIL)  tamsulosin (FLOMAX)   If Needed: cetirizine (ZYRTEC ALLERGY) HYDROcodone-acetaminophen (NORCO/VICODIN) hydroxypropyl methylcellulose / hypromellose (ISOPTO TEARS / GONIOVISC) - eye drops LORazepam (ATIVAN) omeprazole (PRILOSEC)  WHAT DO I DO ABOUT MY DIABETES MEDICATION?   Do not take oral diabetes medicines (pills) the morning of surgery.  DO NOT take Metformin morning of surgery   HOW TO MANAGE YOUR DIABETES BEFORE AND AFTER SURGERY  Why is it important to control my blood sugar before and after surgery? Improving blood sugar levels before and after surgery helps healing and can limit problems. A way of improving blood sugar control is eating a healthy diet by:  Eating less sugar and carbohydrates  Increasing activity/exercise  Talking with your doctor about reaching your blood sugar goals High blood sugars (greater than 180 mg/dL) can raise your risk of infections and slow your recovery, so you will need to focus on controlling your diabetes during the weeks before surgery. Make sure that the doctor who takes care of your diabetes knows about your planned  surgery including the date and location.  How do I manage my blood sugar before surgery? Check your blood sugar at least 4 times a day, starting 2 days before surgery, to make sure that the level is not too high or low.  Check your blood sugar the morning of your surgery when you wake up and every 2 hours until you get to the Short Stay unit.  If your blood sugar is less than 70 mg/dL, you will need to treat for low blood sugar: Do not take insulin. Treat a low blood sugar (less than 70 mg/dL) with  cup of clear juice (cranberry or apple), 4 glucose tablets, OR glucose gel. Recheck blood sugar in 15 minutes after treatment (to make sure it is greater than 70 mg/dL). If your blood sugar is not greater than 70 mg/dL on recheck, call [phone] for further instructions. Report your blood sugar to the short stay nurse when you get to Short Stay.  If you are admitted to the hospital after surgery: Your blood sugar will be checked by the staff and you will probably be given insulin after surgery (instead of oral diabetes medicines) to make sure you have good blood sugar levels. The goal for blood sugar control after surgery is 80-180 mg/dL.     As of today, STOP taking any Aspirin (unless otherwise instructed by your surgeon) Aleve, Naproxen, Ibuprofen, Motrin, Advil, Goody's, BC's, all herbal medications, fish oil, and all vitamins.  Follow your surgeon's instructions on when to stop Plavix.  If no instructions were given by your surgeon then you will need to call the office to  get those instructions.             Do not wear jewelry  Do not wear lotions, powders, colognes, or deodorant. Do not shave 48 hours prior to surgery.  Men may shave face and neck. Do not bring valuables to the hospital.             [facility] is not responsible for any belongings or valuables.  Do NOT Smoke (Tobacco/Vaping) or drink Alcohol 24 hours prior to your procedure If you use a CPAP at night, you may  bring all equipment for your overnight stay.   Contacts, glasses, dentures or bridgework may not be worn into surgery, please bring cases for these belongings   For patients admitted to the hospital, discharge time will be determined by your treatment team.   Patients discharged the day of surgery will not be allowed to drive home, and someone needs to stay with them for 24 hours.  ONLY 1 SUPPORT PERSON MAY BE PRESENT WHILE YOU ARE IN SURGERY. IF YOU ARE TO BE ADMITTED ONCE YOU ARE IN YOUR ROOM YOU WILL BE ALLOWED TWO (2) VISITORS.  Minor children may have two parents present. Special consideration for safety and communication needs will be reviewed on a case by case basis.  Special instructions:    Oral Hygiene is also important to reduce your risk of infection.  Remember - BRUSH YOUR TEETH THE MORNING OF SURGERY WITH YOUR REGULAR TOOTHPASTE   [place]- Preparing For Surgery  Before surgery, you can play an important role. Because skin is not sterile, your skin needs to be as free of germs as possible. You can reduce the number of germs on your skin by washing with CHG (chlorahexidine gluconate) Soap before surgery.  CHG is an antiseptic cleaner which kills germs and bonds with the skin to continue killing germs even after washing.     Please do not use if you have an allergy to CHG or antibacterial soaps. If your skin becomes reddened/irritated stop using the CHG.  Do not shave (including legs and underarms) for at least 48 hours prior to first CHG shower. It is OK to shave your face.  Please follow these instructions carefully.     Shower the NIGHT BEFORE SURGERY and the MORNING OF SURGERY with CHG Soap.   If you chose to wash your hair, wash your hair first as usual with your normal shampoo. After you shampoo, rinse your hair and body thoroughly to remove the shampoo.  Then [employer] and genitals (private parts) with your normal soap and rinse thoroughly to remove soap.  After  that Use CHG Soap as you would any other liquid soap. You can apply CHG directly to the skin and wash gently with a scrungie or a clean washcloth.   Apply the CHG Soap to your body ONLY FROM THE NECK DOWN.  Do not use on open wounds or open sores. Avoid contact with your eyes, ears, mouth and genitals (private parts). Wash Face and genitals (private parts)  with your normal soap.   Wash thoroughly, paying special attention to the area where your surgery will be performed.  Thoroughly rinse your body with warm water from the neck down.  DO NOT shower/wash with your normal soap after using and rinsing off the CHG Soap.  Pat yourself dry with a CLEAN TOWEL.  Wear CLEAN PAJAMAS to bed the night before surgery  Place CLEAN SHEETS on your bed the night before your surgery  DO NOT SLEEP WITH PETS.   Day of Surgery:  Take a shower with CHG soap. Wear Clean/Comfortable clothing the morning of surgery Do not apply any deodorants/lotions.   Remember to brush your teeth WITH YOUR REGULAR TOOTHPASTE.   Please read over the following fact sheets that you were given.

## 2021-02-12 ENCOUNTER — Other Ambulatory Visit: Payer: Self-pay

## 2021-02-12 ENCOUNTER — Encounter (HOSPITAL_COMMUNITY): Payer: Self-pay

## 2021-02-12 ENCOUNTER — Encounter (HOSPITAL_COMMUNITY)
Admission: RE | Admit: 2021-02-12 | Discharge: 2021-02-12 | Disposition: A | Discharge status: Home / Self Care | Payer: Medicare HMO | Source: Ambulatory Visit | Attending: Otolaryngology | Admitting: Otolaryngology

## 2021-02-12 DIAGNOSIS — Z7902 Long term (current) use of antithrombotics/antiplatelets: Secondary | ICD-10-CM | POA: Insufficient documentation

## 2021-02-12 DIAGNOSIS — I69354 Hemiplegia and hemiparesis following cerebral infarction affecting left non-dominant side: Secondary | ICD-10-CM | POA: Insufficient documentation

## 2021-02-12 DIAGNOSIS — Z01812 Encounter for preprocedural laboratory examination: Secondary | ICD-10-CM | POA: Insufficient documentation

## 2021-02-12 DIAGNOSIS — E119 Type 2 diabetes mellitus without complications: Secondary | ICD-10-CM | POA: Insufficient documentation

## 2021-02-12 LAB — CBC
HCT: 41 % (ref 39.0–52.0)
Hemoglobin: 13.4 g/dL (ref 13.0–17.0)
MCH: 27.7 pg (ref 26.0–34.0)
MCHC: 32.7 g/dL (ref 30.0–36.0)
MCV: 84.9 fL (ref 80.0–100.0)
Platelets: 278 10*3/uL (ref 150–400)
RBC: 4.83 MIL/uL (ref 4.22–5.81)
RDW: 13.4 % (ref 11.5–15.5)
WBC: 11.4 10*3/uL — ABNORMAL HIGH (ref 4.0–10.5)
nRBC: 0 % (ref 0.0–0.2)

## 2021-02-12 NOTE — Progress Notes (Signed)
PCP - Lew Dawes MD Cardiologist - Rowland Lathe  PPM/ICD - denies Device Orders -  Rep Notified -   Chest x-ray - 11/14/19 EKG - 03/26/20 Stress Test - 06/30/13 ECHO - 11/10/19 Cardiac Cath - none  Sleep Study - no CPAP - no  Fasting Blood Sugar -  Checks Blood Sugar -pt states he is not diabetic;he does not check his blood sugar and he doesn't know why he takes metformin(glucophage). He was instructed to NOT take metformin(glucopha  Blood Thinner Instructions:per pt, his last dose of Plavix was 8/29 per  his surgeon's instructions.  Aspirin Instructions:n/a  ERAS Protcol -clear liquids until 0830 PRE-SURGERY Ensure or G2- no  COVID TEST- n/a-ambulatory surgery   Anesthesia review: yes.  Patient denies shortness of breath, fever, cough and chest pain at PAT appointment   All instructions explained to the patient, with a verbal understanding of the material. Patient agrees to go over the instructions while at home for a better understanding. Patient also instructed to self quarantine after being tested for COVID-19. The opportunity to ask questions was provided.

## 2021-02-13 NOTE — Anesthesia Preprocedure Evaluation (Addendum)
Anesthesia Evaluation  Patient identified by MRN, date of birth, ID band Patient awake    Reviewed: Allergy & Precautions, NPO status , Patient's Chart, lab work & pertinent test results  History of Anesthesia Complications (+) PONV and history of anesthetic complications  Airway Mallampati: II  TM Distance: >3 FB Neck ROM: Limited    Dental  (+) Missing,    Pulmonary former smoker,    Pulmonary exam normal        Cardiovascular hypertension, Pt. 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, left hemiparesis), Residual Symptoms    GI/Hepatic Neg liver ROS, GERD  Medicated,  Endo/Other  negative endocrine ROS  Renal/GU negative Renal ROS  negative genitourinary   Musculoskeletal  (+) Arthritis ,   Abdominal   Peds  Hematology negative hematology ROS (+)   Anesthesia Other Findings Cancer of alveolus of mandible  Reproductive/Obstetrics negative OB ROS                           Anesthesia Physical Anesthesia Plan  ASA: 3  Anesthesia Plan: General   Post-op Pain Management:    Induction: Intravenous  PONV Risk Score and Plan: 3 and Ondansetron, Dexamethasone and Treatment may vary due to age or medical condition  Airway Management Planned: Nasal ETT  Additional Equipment: None  Intra-op Plan:   Post-operative Plan: Extubation in OR  Informed Consent: I have reviewed the patients History and Physical, chart, labs and discussed the procedure including the risks, benefits and alternatives for the proposed anesthesia with the patient or authorized representative who has indicated his/her understanding and acceptance.     Dental advisory given  Plan Discussed with: CRNA  Anesthesia Plan Comments: (PAT note by Karoline Caldwell, PA-C: History of CVA [phone] with residual left hemiparesis.  Followed by his PCP Dr. Alain Marion.  He is maintained on Plavix.  Per telephone encounter 02/10/2021, patient cleared to stop/start Plavix per Dr. Janeice Robinson recommendations.  Patient reported last dose Plavix 02/10/2021.  Preop labs reviewed, unremarkable.  DM2 well-controlled with A1c 6.8.  EKG 03/25/2020: Normal sinus rhythm.  Rate 70. Nonspecific T wave abnormality  TTE 11/10/2019: 1. Left ventricular ejection fraction, by estimation, is 60 to 65%. The  left ventricle has normal function. The left ventricle has no regional  wall motion abnormalities. There is mild left ventricular hypertrophy.  Left ventricular diastolic parameters  are indeterminate.  2. Right ventricular systolic function is normal. The right ventricular  size is normal.  Carotid duplex 04/29/2017: Final Interpretation:  Right Carotid: There is evidence in the right ICA of a 40-59% stenosis. The ECA appears >50% stenosed.  Left Carotid: There is evidence in the left ICA of a 1-39% stenosis.  Vertebrals: Both vertebral arteries were patent with antegrade flow.  Subclavians: Normal flow hemodynamics were seen in bilateral subclavian arteries.   Nuclear stress 06/29/2013: Overall Impression: Low risk stress nuclear study with no evidence of ischemia, TID. Marland KitchenHypertensive response to exercise.   LV Ejection Fraction: 59%. LV Wall Motion: NL LV Function; NL Wall Motion. Continue to closely monitor and treat HTN. If symptoms worsen or become more worrisome, further cardiac testing may be warranted.    )      Anesthesia Quick Evaluation

## 2021-02-13 NOTE — Progress Notes (Signed)
Anesthesia Chart Review:   History of CVA [id number] with residual left hemiparesis.  Followed by his PCP Dr. Alain Marion.  He is maintained on Plavix.  Per telephone encounter 02/10/2021, patient cleared to stop/start Plavix per Dr. Janeice Robinson recommendations.  Patient reported last dose Plavix 02/10/2021.  Preop labs reviewed, unremarkable.  DM2 well-controlled with A1c 6.8.  EKG 03/25/2020: Normal sinus rhythm.  Rate 70. Nonspecific T wave abnormality  TTE 11/10/2019:  1. Left ventricular ejection fraction, by estimation, is 60 to 65%. The  left ventricle has normal function. The left ventricle has no regional  wall motion abnormalities. There is mild left ventricular hypertrophy.  Left ventricular diastolic parameters  are indeterminate.   2. Right ventricular systolic function is normal. The right ventricular  size is normal.  Carotid duplex 04/29/2017: Final Interpretation:  Right Carotid: There is evidence in the right ICA of a 40-59% stenosis. The ECA appears >50% stenosed.  Left Carotid: There is evidence in the left ICA of a 1-39% stenosis.  Vertebrals:  Both vertebral arteries were patent with antegrade flow.  Subclavians: Normal flow hemodynamics were seen in bilateral subclavian arteries.   Nuclear stress 06/29/2013: Overall Impression:  Low risk stress nuclear study with no evidence of ischemia, TID. Marland KitchenHypertensive response to exercise.    LV Ejection Fraction: 59%.  LV Wall Motion:  NL LV Function; NL Wall Motion. Continue to closely monitor and treat HTN. If symptoms worsen or become more worrisome, further cardiac testing may be warranted.    Wynonia Musty [facility]/Anesthesiology Phone [phone] 02/13/2021 9:33 AM

## 2021-02-14 ENCOUNTER — Ambulatory Visit (HOSPITAL_COMMUNITY): Payer: Medicare HMO | Admitting: Physician Assistant

## 2021-02-14 ENCOUNTER — Encounter (HOSPITAL_COMMUNITY)
Admission: RE | Disposition: A | Discharge status: Home / Self Care | Payer: Self-pay | Source: Home / Self Care | Attending: Otolaryngology

## 2021-02-14 ENCOUNTER — Other Ambulatory Visit: Payer: Self-pay

## 2021-02-14 ENCOUNTER — Ambulatory Visit (HOSPITAL_COMMUNITY): Payer: Medicare HMO | Admitting: Anesthesiology

## 2021-02-14 ENCOUNTER — Observation Stay (HOSPITAL_COMMUNITY)
Admission: RE | Admit: 2021-02-14 | Discharge: 2021-02-14 | Disposition: A | Discharge status: Home / Self Care | Payer: Medicare HMO | Attending: Otolaryngology | Admitting: Otolaryngology

## 2021-02-14 ENCOUNTER — Encounter (HOSPITAL_COMMUNITY): Payer: Self-pay | Admitting: Otolaryngology

## 2021-02-14 DIAGNOSIS — Z7984 Long term (current) use of oral hypoglycemic drugs: Secondary | ICD-10-CM | POA: Insufficient documentation

## 2021-02-14 DIAGNOSIS — Z87891 Personal history of nicotine dependence: Secondary | ICD-10-CM | POA: Insufficient documentation

## 2021-02-14 DIAGNOSIS — Z79899 Other long term (current) drug therapy: Secondary | ICD-10-CM | POA: Diagnosis not present

## 2021-02-14 DIAGNOSIS — C069 Malignant neoplasm of mouth, unspecified: Secondary | ICD-10-CM | POA: Diagnosis not present

## 2021-02-14 DIAGNOSIS — K219 Gastro-esophageal reflux disease without esophagitis: Secondary | ICD-10-CM | POA: Diagnosis not present

## 2021-02-14 DIAGNOSIS — E785 Hyperlipidemia, unspecified: Secondary | ICD-10-CM | POA: Diagnosis not present

## 2021-02-14 DIAGNOSIS — I10 Essential (primary) hypertension: Secondary | ICD-10-CM | POA: Diagnosis not present

## 2021-02-14 DIAGNOSIS — C411 Malignant neoplasm of mandible: Principal | ICD-10-CM | POA: Insufficient documentation

## 2021-02-14 HISTORY — PX: ALVEOLOPLASTY: SHX5710

## 2021-02-14 LAB — GLUCOSE, CAPILLARY: Glucose-Capillary: 134 mg/dL — ABNORMAL HIGH (ref 70–99)

## 2021-02-14 SURGERY — ALVEOLOPLASTY
Anesthesia: General | Site: Mouth | Laterality: Left

## 2021-02-14 MED ORDER — CHLORHEXIDINE GLUCONATE 0.12 % MT SOLN
15.0000 mL | Freq: Once | OROMUCOSAL | Status: AC
  Administered 2021-02-14: 15 mL via OROMUCOSAL
  Filled 2021-02-14: qty 15

## 2021-02-14 MED ORDER — ONDANSETRON HCL 4 MG/2ML IJ SOLN
INTRAMUSCULAR | Status: DC | PRN
  Administered 2021-02-14: 4 mg via INTRAVENOUS

## 2021-02-14 MED ORDER — ORAL CARE MOUTH RINSE: 15.0000 mL | Freq: Once | OROMUCOSAL | Status: AC

## 2021-02-14 MED ORDER — HYDROCODONE-ACETAMINOPHEN 7.5-325 MG PO TABS: 1.0000 | ORAL_TABLET | Freq: Four times a day (QID) | ORAL | 0 refills | Status: DC | PRN

## 2021-02-14 MED ORDER — HYDROCODONE-ACETAMINOPHEN 5-325 MG PO TABS
ORAL_TABLET | ORAL | Status: AC
  Filled 2021-02-14: qty 1

## 2021-02-14 MED ORDER — LACTATED RINGERS IV SOLN: INTRAVENOUS | Status: DC

## 2021-02-14 MED ORDER — VITAMIN D 25 MCG (1000 UNIT) PO TABS: 1000.0000 [IU] | ORAL_TABLET | Freq: Every day | ORAL | Status: DC

## 2021-02-14 MED ORDER — ONDANSETRON HCL 4 MG/2ML IJ SOLN
INTRAMUSCULAR | Status: AC
  Filled 2021-02-14: qty 2

## 2021-02-14 MED ORDER — OXYCODONE HCL 5 MG PO TABS
5.0000 mg | ORAL_TABLET | Freq: Once | ORAL | Status: DC | PRN
Start: 2021-02-14 — End: 2021-02-14

## 2021-02-14 MED ORDER — ONDANSETRON HCL 4 MG PO TABS: 4.0000 mg | ORAL_TABLET | ORAL | Status: DC | PRN

## 2021-02-14 MED ORDER — SUGAMMADEX SODIUM 200 MG/2ML IV SOLN
INTRAVENOUS | Status: DC | PRN
  Administered 2021-02-14: 200 mg via INTRAVENOUS

## 2021-02-14 MED ORDER — PREGABALIN 50 MG PO CAPS: 50.0000 mg | ORAL_CAPSULE | Freq: Three times a day (TID) | ORAL | Status: DC

## 2021-02-14 MED ORDER — METFORMIN HCL 500 MG PO TABS: 500.0000 mg | ORAL_TABLET | Freq: Every day | ORAL | Status: DC

## 2021-02-14 MED ORDER — FENTANYL CITRATE (PF) 250 MCG/5ML IJ SOLN
INTRAMUSCULAR | Status: AC
  Filled 2021-02-14: qty 5

## 2021-02-14 MED ORDER — AMLODIPINE BESYLATE 2.5 MG PO TABS: 2.5000 mg | ORAL_TABLET | Freq: Every day | ORAL | Status: DC

## 2021-02-14 MED ORDER — EPHEDRINE 5 MG/ML INJ
INTRAVENOUS | Status: AC
  Filled 2021-02-14: qty 5

## 2021-02-14 MED ORDER — TAMSULOSIN HCL 0.4 MG PO CAPS: 0.4000 mg | ORAL_CAPSULE | Freq: Every day | ORAL | Status: DC

## 2021-02-14 MED ORDER — OXYMETAZOLINE HCL 0.05 % NA SOLN
2.0000 | NASAL | Status: DC
  Administered 2021-02-14: 2 via NASAL
  Filled 2021-02-14 (×2): qty 30

## 2021-02-14 MED ORDER — DEXAMETHASONE SODIUM PHOSPHATE 10 MG/ML IJ SOLN
INTRAMUSCULAR | Status: DC | PRN
  Administered 2021-02-14: 10 mg via INTRAVENOUS

## 2021-02-14 MED ORDER — FENTANYL CITRATE (PF) 100 MCG/2ML IJ SOLN
25.0000 ug | INTRAMUSCULAR | Status: DC | PRN
  Administered 2021-02-14 (×2): 25 ug via INTRAVENOUS

## 2021-02-14 MED ORDER — DEXAMETHASONE SODIUM PHOSPHATE 10 MG/ML IJ SOLN
INTRAMUSCULAR | Status: AC
  Filled 2021-02-14: qty 1

## 2021-02-14 MED ORDER — LORAZEPAM 0.5 MG PO TABS: 0.5000 mg | ORAL_TABLET | Freq: Two times a day (BID) | ORAL | Status: DC | PRN

## 2021-02-14 MED ORDER — ONDANSETRON HCL 4 MG/2ML IJ SOLN: 4.0000 mg | INTRAMUSCULAR | Status: DC | PRN

## 2021-02-14 MED ORDER — CLINDAMYCIN HCL 300 MG PO CAPS: 300.0000 mg | ORAL_CAPSULE | Freq: Three times a day (TID) | ORAL | 0 refills | Status: DC

## 2021-02-14 MED ORDER — HYDROCODONE-ACETAMINOPHEN 5-325 MG PO TABS: 1.0000 | ORAL_TABLET | ORAL | Status: DC | PRN

## 2021-02-14 MED ORDER — ROCURONIUM BROMIDE 100 MG/10ML IV SOLN
INTRAVENOUS | Status: DC | PRN
  Administered 2021-02-14: 50 mg via INTRAVENOUS

## 2021-02-14 MED ORDER — ACETAMINOPHEN 500 MG PO TABS
1000.0000 mg | ORAL_TABLET | Freq: Once | ORAL | Status: AC
  Administered 2021-02-14: 1000 mg via ORAL
  Filled 2021-02-14: qty 2

## 2021-02-14 MED ORDER — HYPROMELLOSE (GONIOSCOPIC) 2.5 % OP SOLN: 1.0000 [drp] | Freq: Three times a day (TID) | OPHTHALMIC | Status: DC | PRN

## 2021-02-14 MED ORDER — PANTOPRAZOLE SODIUM 40 MG PO TBEC: 40.0000 mg | DELAYED_RELEASE_TABLET | Freq: Every day | ORAL | Status: DC

## 2021-02-14 MED ORDER — CLINDAMYCIN PALMITATE HCL 75 MG/5ML PO SOLR: 300.0000 mg | Freq: Three times a day (TID) | ORAL | Status: DC

## 2021-02-14 MED ORDER — FENTANYL CITRATE (PF) 100 MCG/2ML IJ SOLN
INTRAMUSCULAR | Status: DC | PRN
  Administered 2021-02-14 (×2): 50 ug via INTRAVENOUS

## 2021-02-14 MED ORDER — VALACYCLOVIR HCL 500 MG PO TABS: 500.0000 mg | ORAL_TABLET | Freq: Two times a day (BID) | ORAL | Status: DC

## 2021-02-14 MED ORDER — CLINDAMYCIN PHOSPHATE 900 MG/50ML IV SOLN
900.0000 mg | INTRAVENOUS | Status: AC
  Administered 2021-02-14: 900 mg via INTRAVENOUS
  Filled 2021-02-14: qty 50

## 2021-02-14 MED ORDER — KCL-LACTATED RINGERS-D5W 20 MEQ/L IV SOLN: INTRAVENOUS | Status: DC

## 2021-02-14 MED ORDER — 0.9 % SODIUM CHLORIDE (POUR BTL) OPTIME
TOPICAL | Status: DC | PRN
  Administered 2021-02-14: 1000 mL

## 2021-02-14 MED ORDER — LIDOCAINE 2% (20 MG/ML) 5 ML SYRINGE
INTRAMUSCULAR | Status: DC | PRN
  Administered 2021-02-14: 100 mg via INTRAVENOUS

## 2021-02-14 MED ORDER — LOSARTAN POTASSIUM 50 MG PO TABS: 50.0000 mg | ORAL_TABLET | Freq: Every day | ORAL | Status: DC

## 2021-02-14 MED ORDER — PROPOFOL 10 MG/ML IV BOLUS
INTRAVENOUS | Status: AC
  Filled 2021-02-14: qty 20

## 2021-02-14 MED ORDER — FENTANYL CITRATE (PF) 100 MCG/2ML IJ SOLN
INTRAMUSCULAR | Status: AC
  Filled 2021-02-14: qty 2

## 2021-02-14 MED ORDER — OXYCODONE HCL 5 MG/5ML PO SOLN
5.0000 mg | Freq: Once | ORAL | Status: DC | PRN
Start: 2021-02-14 — End: 2021-02-14

## 2021-02-14 MED ORDER — ONDANSETRON 8 MG PO TBDP: 8.0000 mg | ORAL_TABLET | Freq: Three times a day (TID) | ORAL | 0 refills | Status: DC | PRN

## 2021-02-14 MED ORDER — MIDAZOLAM HCL 2 MG/2ML IJ SOLN
INTRAMUSCULAR | Status: AC
  Filled 2021-02-14: qty 2

## 2021-02-14 MED ORDER — HYDROCODONE-ACETAMINOPHEN 5-325 MG PO TABS: 1.0000 | ORAL_TABLET | Freq: Four times a day (QID) | ORAL | Status: DC | PRN

## 2021-02-14 MED ORDER — LIDOCAINE 2% (20 MG/ML) 5 ML SYRINGE
INTRAMUSCULAR | Status: AC
  Filled 2021-02-14: qty 5

## 2021-02-14 MED ORDER — INSULIN ASPART 100 UNIT/ML IJ SOLN: 0.0000 [IU] | Freq: Three times a day (TID) | INTRAMUSCULAR | Status: DC

## 2021-02-14 MED ORDER — ROCURONIUM BROMIDE 10 MG/ML (PF) SYRINGE
PREFILLED_SYRINGE | INTRAVENOUS | Status: AC
  Filled 2021-02-14: qty 10

## 2021-02-14 MED ORDER — HYDROCODONE-ACETAMINOPHEN 5-325 MG PO TABS
1.0000 | ORAL_TABLET | Freq: Four times a day (QID) | ORAL | Status: DC | PRN
  Administered 2021-02-14: 1 via ORAL

## 2021-02-14 MED ORDER — MIDAZOLAM HCL 5 MG/5ML IJ SOLN
INTRAMUSCULAR | Status: DC | PRN
  Administered 2021-02-14 (×2): 1 mg via INTRAVENOUS

## 2021-02-14 MED ORDER — EPHEDRINE SULFATE-NACL 50-0.9 MG/10ML-% IV SOSY
PREFILLED_SYRINGE | INTRAVENOUS | Status: DC | PRN
  Administered 2021-02-14 (×2): 5 mg via INTRAVENOUS

## 2021-02-14 MED ORDER — DIPHENHYDRAMINE HCL 50 MG/ML IJ SOLN
INTRAMUSCULAR | Status: DC | PRN
  Administered 2021-02-14: 6.25 mg via INTRAVENOUS

## 2021-02-14 MED ORDER — VITAMIN C 250 MG PO TABS: 250.0000 mg | ORAL_TABLET | Freq: Every day | ORAL | Status: DC

## 2021-02-14 MED ORDER — TRIAMTERENE-HCTZ 37.5-25 MG PO TABS: 0.5000 | ORAL_TABLET | Freq: Every day | ORAL | Status: DC

## 2021-02-14 MED ORDER — DOCUSATE SODIUM 100 MG PO CAPS: 100.0000 mg | ORAL_CAPSULE | Freq: Two times a day (BID) | ORAL | Status: DC

## 2021-02-14 MED ORDER — PHENYLEPHRINE HCL-NACL 20-0.9 MG/250ML-% IV SOLN
INTRAVENOUS | Status: DC | PRN
  Administered 2021-02-14: 40 ug/min via INTRAVENOUS

## 2021-02-14 MED ORDER — VARDENAFIL HCL 20 MG PO TABS: 20.0000 mg | ORAL_TABLET | Freq: Every day | ORAL | Status: DC | PRN

## 2021-02-14 MED ORDER — PROPOFOL 10 MG/ML IV BOLUS
INTRAVENOUS | Status: DC | PRN
  Administered 2021-02-14: 140 mg via INTRAVENOUS

## 2021-02-14 SURGICAL SUPPLY — 43 items
BAG COUNTER SPONGE SURGICOUNT (BAG) ×2 IMPLANT
BLADE DERMATOME SS (BLADE) IMPLANT
BNDG GAUZE ELAST 4 BULKY (GAUZE/BANDAGES/DRESSINGS) IMPLANT
BUR EGG ELITE 4.0 (BURR) ×2 IMPLANT
BUR ROUND PRECISION 4.0 (BURR) ×2 IMPLANT
BUR TAPERED ROUTER 3.0 (BURR) ×2 IMPLANT
CANISTER SUCT 3000ML PPV (MISCELLANEOUS) ×2 IMPLANT
CLEANER TIP ELECTROSURG 2X2 (MISCELLANEOUS) ×2 IMPLANT
CORD BIPOLAR FORCEPS 12FT (ELECTRODE) ×2 IMPLANT
DERMACARRIERS GRAFT 1 TO 1.5 (DISPOSABLE)
DRAPE HALF SHEET 40X57 (DRAPES) IMPLANT
DRAPE ORTHO SPLIT 77X108 STRL (DRAPES) ×4
DRAPE SURG ORHT 6 SPLT 77X108 (DRAPES) ×2 IMPLANT
DRESSING NASAL KENNEDY 3.5X.9 (MISCELLANEOUS) IMPLANT
DRSG NASAL KENNEDY 3.5X.9 (MISCELLANEOUS)
DRSG NASOPORE 8CM (GAUZE/BANDAGES/DRESSINGS) IMPLANT
ELECT COATED BLADE 2.86 ST (ELECTRODE) ×2 IMPLANT
ELECT REM PT RETURN 9FT ADLT (ELECTROSURGICAL)
ELECTRODE REM PT RTRN 9FT ADLT (ELECTROSURGICAL) IMPLANT
FORCEPS BIPOLAR SPETZLER 8 1.0 (NEUROSURGERY SUPPLIES) ×2 IMPLANT
GAUZE 4X4 16PLY ~~LOC~~+RFID DBL (SPONGE) ×2 IMPLANT
GAUZE PACKING IODOFORM 1X5 (PACKING) ×2 IMPLANT
GAUZE XEROFORM 5X9 LF (GAUZE/BANDAGES/DRESSINGS) IMPLANT
GLOVE SURG LTX SZ7.5 (GLOVE) ×2 IMPLANT
GOWN STRL REUS W/ TWL LRG LVL3 (GOWN DISPOSABLE) ×2 IMPLANT
GOWN STRL REUS W/TWL LRG LVL3 (GOWN DISPOSABLE) ×4
GRAFT DERMACARRIERS 1 TO 1.5 (DISPOSABLE) IMPLANT
HEMOSTAT SURGICEL 4X8 (HEMOSTASIS) ×2 IMPLANT
KIT BASIN OR (CUSTOM PROCEDURE TRAY) ×2 IMPLANT
KIT TURNOVER KIT B (KITS) ×2 IMPLANT
NEEDLE PRECISIONGLIDE 27X1.5 (NEEDLE) ×2 IMPLANT
NS IRRIG 1000ML POUR BTL (IV SOLUTION) ×2 IMPLANT
PAD ARMBOARD 7.5X6 YLW CONV (MISCELLANEOUS) ×4 IMPLANT
PATTIES SURGICAL .5 X3 (DISPOSABLE) ×2 IMPLANT
PENCIL FOOT CONTROL (ELECTRODE) ×2 IMPLANT
SPECIMEN JAR SMALL (MISCELLANEOUS) ×2 IMPLANT
SPONGE T-LAP 18X18 ~~LOC~~+RFID (SPONGE) ×2 IMPLANT
STAPLER VISISTAT 35W (STAPLE) ×2 IMPLANT
SUT CHROMIC 5 0 P 3 (SUTURE) IMPLANT
TOWEL GREEN STERILE FF (TOWEL DISPOSABLE) ×2 IMPLANT
TRAY ENT MC OR (CUSTOM PROCEDURE TRAY) ×2 IMPLANT
TUBE CONNECTING 12X1/4 (SUCTIONS) ×2 IMPLANT
WATER STERILE IRR 1000ML POUR (IV SOLUTION) ×2 IMPLANT

## 2021-02-14 NOTE — Discharge Instructions (Addendum)
Rinse mouth with salt water several times daily.  Brush teeth the way you normally do just be careful with the back teeth on the left lower jaw.  Resume Plavix on Tuesday.

## 2021-02-14 NOTE — Anesthesia Postprocedure Evaluation (Signed)
Anesthesia Post Note  Patient: Francis Sims.  Procedure(s) Performed: EXCISION OF LEFT MANDIBULAR ALVEOLAR CANCER; INCLUDING SECOND MOLAR (Left: Mouth)     Patient location during evaluation: PACU Anesthesia Type: General Level of consciousness: awake and alert and oriented Pain management: pain level controlled Vital Signs Assessment: post-procedure vital signs reviewed and stable Respiratory status: spontaneous breathing, nonlabored ventilation and respiratory function stable Cardiovascular status: blood pressure returned to baseline Postop Assessment: no apparent nausea or vomiting Anesthetic complications: no   No notable events documented.  Last Vitals:  Vitals:   02/14/21 1225 02/14/21 1240  BP: 127/67 127/67  Pulse: 76 71  Resp: 13 18  Temp:  (!) 36.2 C  SpO2: 95% 100%    Last Pain:  Vitals:   02/14/21 1240  TempSrc:   PainSc: [place]

## 2021-02-14 NOTE — Anesthesia Procedure Notes (Signed)
Procedure Name: Intubation Date/Time: 02/14/2021 10:47 AM Performed by: Terrence Dupont, CRNA Pre-anesthesia Checklist: Patient identified, Emergency Drugs available, Suction available and Patient being monitored Patient Re-evaluated:Patient Re-evaluated prior to induction Oxygen Delivery Method: Circle system utilized Preoxygenation: Pre-oxygenation with 100% oxygen Induction Type: IV induction Ventilation: Mask ventilation without difficulty Laryngoscope Size: Glidescope Grade View: Grade I Nasal Tubes: Nasal prep performed, Nasal Rae and Magill forceps - small, utilized Tube size: 7.0 mm Number of attempts: 1 Placement Confirmation: ETT inserted through vocal cords under direct vision, positive ETCO2 and breath sounds checked- equal and bilateral Tube secured with: Tape Dental Injury: Teeth and Oropharynx as per pre-operative assessment

## 2021-02-14 NOTE — Transfer of Care (Signed)
Immediate Anesthesia Transfer of Care Note  Patient: Francis Sims.  Procedure(s) Performed: EXCISION OF LEFT MANDIBULAR ALVEOLAR CANCER; INCLUDING SECOND MOLAR (Left: Mouth)  Patient Location: PACU  Anesthesia Type:General  Level of Consciousness: drowsy  Airway & Oxygen Therapy: Patient Spontanous Breathing  Post-op Assessment: Report given to RN and Post -op Vital signs reviewed and stable  Post vital signs: Reviewed and stable  Last Vitals:  Vitals Value Taken Time  BP 133/70 02/14/21 1209  Temp    Pulse 78 02/14/21 1210  Resp 20 02/14/21 1210  SpO2 95 % 02/14/21 1210  Vitals shown include unvalidated device data.  Last Pain:  Vitals:   02/14/21 0956  TempSrc:   PainSc: 0-No pain         Complications: No notable events documented.

## 2021-02-14 NOTE — Op Note (Signed)
OPERATIVE REPORT  DATE OF SURGERY: 02/14/2021  PATIENT:  Francis Ade.,  80 y.o. 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  Left posterior mandibular alveolar process, suture marks lateral mucosal margin.  Left mandibular second molar, separate.  COUNTS:  Correct  PROCEDURE DETAILS: The patient was taken to the operating room and placed on the operating table in the supine position. Following induction of general endotracheal anesthesia, using nasal tube, the mouth was draped in a standard fashion.  A right sided bite block was used and a lip and cheek retractor was used throughout the case.  The lesion was identified in the mucosa lateral and posterior to the left mandibular second molar.  The medial side was clear.  The third molar was missing.  Electrocautery was used to incise the mucosa allowing for 1 cm margins around the obvious cancer.  Dissection continued down to the mandibular bone.  The second molar was extracted using tooth extraction forceps in order to facilitate the exposure of the bone and the drill use.  A sidecutting bur was used with a guard to initiate the alveolectomy.  A small round bur was then used to continue and osteotomes were used to complete the bony resection.  The medial mucosa and soft tissue was then divided using electrocautery.  The specimen was sent for pathologic evaluation.  A football shaped cutting bur was then used to smooth out the bony cuts.  A 3 x 1 cm piece of Surgicel was placed into the posterior defect for hemostasis.  The buccal mucosa was reapproximated to the floor of mouth mucosa using interrupted 3-0 Vicryl  sutures.  The oral cavity was irrigated with saline and suction.  The patient was awakened extubated and transferred to recovery in stable condition.    PATIENT DISPOSITION:  To PACU, stable

## 2021-02-14 NOTE — Interval H&P Note (Signed)
History and Physical Interval Note:  02/14/2021 10:09 AM  Francis Sims.  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  The various methods of treatment have been discussed with the patient and family. After consideration of risks, benefits and other options for treatment, the patient has consented to  Procedure(s): EXCISION OF LEFT MANDIBULAR ALVEOLAR CANCER; INCLUDING SECOND MOLAR (Left) as a surgical intervention.  The patient's history has been reviewed, patient examined, no change in status, stable for surgery.  I have reviewed the patient's chart and labs.  Questions were answered to the patient's satisfaction.     Izora Gala

## 2021-02-15 ENCOUNTER — Encounter (HOSPITAL_COMMUNITY): Payer: Self-pay | Admitting: Otolaryngology

## 2021-02-18 ENCOUNTER — Encounter (HOSPITAL_COMMUNITY): Payer: Self-pay | Admitting: Otolaryngology

## 2021-02-18 ENCOUNTER — Other Ambulatory Visit: Payer: Self-pay

## 2021-02-18 ENCOUNTER — Inpatient Hospital Stay (HOSPITAL_COMMUNITY)
Admission: AD | Admit: 2021-02-18 | Discharge: 2021-02-27 | DRG: 641 | Disposition: A | Discharge status: Home Health | Payer: Medicare HMO | Source: Ambulatory Visit | Attending: Otolaryngology | Admitting: Otolaryngology

## 2021-02-18 ENCOUNTER — Observation Stay (HOSPITAL_COMMUNITY): Payer: Medicare HMO

## 2021-02-18 DIAGNOSIS — Z4659 Encounter for fitting and adjustment of other gastrointestinal appliance and device: Secondary | ICD-10-CM

## 2021-02-18 DIAGNOSIS — C411 Malignant neoplasm of mandible: Secondary | ICD-10-CM | POA: Diagnosis present

## 2021-02-18 DIAGNOSIS — Z20822 Contact with and (suspected) exposure to covid-19: Secondary | ICD-10-CM | POA: Diagnosis present

## 2021-02-18 DIAGNOSIS — R633 Feeding difficulties, unspecified: Secondary | ICD-10-CM | POA: Diagnosis present

## 2021-02-18 DIAGNOSIS — E440 Moderate protein-calorie malnutrition: Principal | ICD-10-CM | POA: Insufficient documentation

## 2021-02-18 DIAGNOSIS — Z7902 Long term (current) use of antithrombotics/antiplatelets: Secondary | ICD-10-CM

## 2021-02-18 DIAGNOSIS — R1030 Lower abdominal pain, unspecified: Secondary | ICD-10-CM | POA: Diagnosis not present

## 2021-02-18 DIAGNOSIS — R1310 Dysphagia, unspecified: Secondary | ICD-10-CM

## 2021-02-18 DIAGNOSIS — Z4682 Encounter for fitting and adjustment of non-vascular catheter: Secondary | ICD-10-CM | POA: Diagnosis not present

## 2021-02-18 DIAGNOSIS — R638 Other symptoms and signs concerning food and fluid intake: Secondary | ICD-10-CM | POA: Diagnosis present

## 2021-02-18 DIAGNOSIS — Z7984 Long term (current) use of oral hypoglycemic drugs: Secondary | ICD-10-CM

## 2021-02-18 DIAGNOSIS — Z9889 Other specified postprocedural states: Secondary | ICD-10-CM

## 2021-02-18 DIAGNOSIS — Z6823 Body mass index (BMI) 23.0-23.9, adult: Secondary | ICD-10-CM

## 2021-02-18 DIAGNOSIS — Z79899 Other long term (current) drug therapy: Secondary | ICD-10-CM

## 2021-02-18 LAB — CBC WITH DIFFERENTIAL/PLATELET
Abs Immature Granulocytes: 0.06 10*3/uL (ref 0.00–0.07)
Basophils Absolute: 0 10*3/uL (ref 0.0–0.1)
Basophils Relative: 0 %
Eosinophils Absolute: 0 10*3/uL (ref 0.0–0.5)
Eosinophils Relative: 0 %
HCT: 40.3 % (ref 39.0–52.0)
Hemoglobin: 13.5 g/dL (ref 13.0–17.0)
Immature Granulocytes: 1 %
Lymphocytes Relative: 12 %
Lymphs Abs: 1.3 10*3/uL (ref 0.7–4.0)
MCH: 28.1 pg (ref 26.0–34.0)
MCHC: 33.5 g/dL (ref 30.0–36.0)
MCV: 83.8 fL (ref 80.0–100.0)
Monocytes Absolute: 0.8 10*3/uL (ref 0.1–1.0)
Monocytes Relative: 7 %
Neutro Abs: 8.9 10*3/uL — ABNORMAL HIGH (ref 1.7–7.7)
Neutrophils Relative %: 80 %
Platelets: 298 10*3/uL (ref 150–400)
RBC: 4.81 MIL/uL (ref 4.22–5.81)
RDW: 13 % (ref 11.5–15.5)
WBC: 11.2 10*3/uL — ABNORMAL HIGH (ref 4.0–10.5)
nRBC: 0 % (ref 0.0–0.2)

## 2021-02-18 LAB — COMPREHENSIVE METABOLIC PANEL
ALT: 12 U/L (ref 0–44)
AST: 18 U/L (ref 15–41)
Albumin: 3.5 g/dL (ref 3.5–5.0)
Alkaline Phosphatase: 63 U/L (ref 38–126)
Anion gap: 11 (ref 5–15)
BUN: 24 mg/dL — ABNORMAL HIGH (ref 8–23)
CO2: 26 mmol/L (ref 22–32)
Calcium: 9.4 mg/dL (ref 8.9–10.3)
Chloride: 99 mmol/L (ref 98–111)
Creatinine, Ser: 1.23 mg/dL (ref 0.61–1.24)
GFR, Estimated: >60 mL/min (ref >60)
Glucose, Bld: 112 mg/dL — ABNORMAL HIGH (ref 70–99)
Potassium: 4.1 mmol/L (ref 3.5–5.1)
Sodium: 136 mmol/L (ref 135–145)
Total Bilirubin: 1.9 mg/dL — ABNORMAL HIGH (ref 0.3–1.2)
Total Protein: 7.2 g/dL (ref 6.5–8.1)

## 2021-02-18 LAB — GLUCOSE, CAPILLARY
Glucose-Capillary: 106 mg/dL — ABNORMAL HIGH (ref 70–99)
Glucose-Capillary: 169 mg/dL — ABNORMAL HIGH (ref 70–99)
Glucose-Capillary: 77 mg/dL (ref 70–99)
Glucose-Capillary: 86 mg/dL (ref 70–99)

## 2021-02-18 LAB — MAGNESIUM: Magnesium: 1.9 mg/dL (ref 1.7–2.4)

## 2021-02-18 LAB — PHOSPHORUS: Phosphorus: 3.9 mg/dL (ref 2.5–4.6)

## 2021-02-18 MED ORDER — OSMOLITE 1.2 CAL PO LIQD
1000.0000 mL | ORAL | Status: DC
  Filled 2021-02-18: qty 1000

## 2021-02-18 MED ORDER — OSMOLITE 1.2 CAL PO LIQD
1000.0000 mL | ORAL | Status: AC
  Administered 2021-02-18: 1000 mL
  Filled 2021-02-18 (×2): qty 1000

## 2021-02-18 MED ORDER — HYDROCODONE-ACETAMINOPHEN 7.5-325 MG/15ML PO SOLN
10.0000 mL | Freq: Four times a day (QID) | ORAL | Status: DC | PRN
  Administered 2021-02-18 – 2021-02-19 (×4): 10 mL
  Filled 2021-02-18 (×4): qty 15

## 2021-02-18 MED ORDER — MORPHINE SULFATE (PF) 2 MG/ML IV SOLN
2.0000 mg | INTRAVENOUS | Status: DC | PRN
Start: 2021-02-18 — End: 2021-02-22
  Administered 2021-02-18 – 2021-02-22 (×15): 2 mg via INTRAVENOUS
  Filled 2021-02-18 (×15): qty 1

## 2021-02-18 MED ORDER — HYDROCODONE-ACETAMINOPHEN 5-325 MG PO TABS
1.0000 | ORAL_TABLET | ORAL | Status: DC | PRN
  Administered 2021-02-18: 2 via ORAL
  Filled 2021-02-18: qty 2
  Filled 2021-02-18: qty 1

## 2021-02-18 MED ORDER — POTASSIUM CHLORIDE 2 MEQ/ML IV SOLN
INTRAVENOUS | Status: DC
  Filled 2021-02-18 (×3): qty 1000

## 2021-02-18 MED ORDER — IBUPROFEN 400 MG PO TABS
400.0000 mg | ORAL_TABLET | Freq: Four times a day (QID) | ORAL | Status: DC | PRN
  Administered 2021-02-19: 400 mg via ORAL
  Filled 2021-02-18: qty 1

## 2021-02-18 MED ORDER — ORAL CARE MOUTH RINSE
15.0000 mL | Freq: Two times a day (BID) | OROMUCOSAL | Status: DC
  Administered 2021-02-18 – 2021-02-27 (×15): 15 mL via OROMUCOSAL

## 2021-02-18 MED ORDER — ENSURE ENLIVE PO LIQD
237.0000 mL | Freq: Two times a day (BID) | ORAL | Status: DC
  Administered 2021-02-18: 237 mL via ORAL

## 2021-02-18 MED ORDER — INSULIN ASPART 100 UNIT/ML IJ SOLN
0.0000 [IU] | INTRAMUSCULAR | Status: DC
  Administered 2021-02-18 – 2021-02-19 (×2): 3 [IU] via SUBCUTANEOUS
  Administered 2021-02-19 (×2): 2 [IU] via SUBCUTANEOUS
  Administered 2021-02-19: 3 [IU] via SUBCUTANEOUS
  Administered 2021-02-19 – 2021-02-20 (×4): 2 [IU] via SUBCUTANEOUS
  Administered 2021-02-20: 3 [IU] via SUBCUTANEOUS
  Administered 2021-02-20: 5 [IU] via SUBCUTANEOUS
  Administered 2021-02-20 – 2021-02-21 (×2): 3 [IU] via SUBCUTANEOUS
  Administered 2021-02-21: 2 [IU] via SUBCUTANEOUS
  Administered 2021-02-21 (×2): 5 [IU] via SUBCUTANEOUS
  Administered 2021-02-21 – 2021-02-22 (×4): 3 [IU] via SUBCUTANEOUS
  Administered 2021-02-22: 8 [IU] via SUBCUTANEOUS
  Administered 2021-02-22: 5 [IU] via SUBCUTANEOUS
  Administered 2021-02-22 – 2021-02-23 (×3): 3 [IU] via SUBCUTANEOUS
  Administered 2021-02-23 (×2): 5 [IU] via SUBCUTANEOUS
  Administered 2021-02-23 – 2021-02-24 (×3): 8 [IU] via SUBCUTANEOUS
  Administered 2021-02-24: 3 [IU] via SUBCUTANEOUS
  Administered 2021-02-24: 8 [IU] via SUBCUTANEOUS
  Administered 2021-02-24: 3 [IU] via SUBCUTANEOUS
  Administered 2021-02-24 (×2): 8 [IU] via SUBCUTANEOUS
  Administered 2021-02-25: 3 [IU] via SUBCUTANEOUS
  Administered 2021-02-25 (×6): 5 [IU] via SUBCUTANEOUS
  Administered 2021-02-26 (×3): 3 [IU] via SUBCUTANEOUS
  Administered 2021-02-26: 11 [IU] via SUBCUTANEOUS
  Administered 2021-02-26: 8 [IU] via SUBCUTANEOUS
  Administered 2021-02-27: 3 [IU] via SUBCUTANEOUS
  Administered 2021-02-27 (×2): 5 [IU] via SUBCUTANEOUS
  Administered 2021-02-27: 8 [IU] via SUBCUTANEOUS

## 2021-02-18 MED ORDER — DIPHENHYDRAMINE HCL 25 MG PO CAPS
25.0000 mg | ORAL_CAPSULE | Freq: Four times a day (QID) | ORAL | Status: DC | PRN
  Administered 2021-02-18 – 2021-02-25 (×9): 25 mg via ORAL
  Filled 2021-02-18 (×9): qty 1

## 2021-02-18 NOTE — Progress Notes (Signed)
New patient admitted into 6N 9. Patient is AXOx4, RA. Daughter is at bedside, bed is in the lowest position and call light within reach.

## 2021-02-18 NOTE — H&P (Signed)
Francis Sims is a 80 y.o. male who presents as a return  Patient.    Referring Provider: Self, A Referral     Chief complaint: Inability to eat and drink.   HPI: Underwent left mandibular alveolar cancer resection on Friday.  He was doing well Friday and wanted to go home.  At home he has been having increasing difficulty with taking anything by mouth.  He was losing weight prior to the surgery and has not really eaten much of anything.  He has been drinking small amounts and has voided small amounts intermittently.  He is having itching from the pain medicine but no rash.   PMH/Meds/All/SocHx/FamHx/ROS:    Past Medical History  History reviewed. No pertinent past medical history.     Past Surgical History       Past Surgical History:  Procedure Laterality Date   BACK SURGERY       HERNIA REPAIR       NECK SURGERY       TONSILLECTOMY            No family history of bleeding disorders, wound healing problems or difficulty with anesthesia.    Social History  Social History         Socioeconomic History   Marital status: Married      Spouse name: Not on file   Number of children: Not on file   Years of education: Not on file   Highest education level: Not on file  Occupational History   Not on file  Tobacco Use   Smoking status: Former Smoker      Quit date: 1971      Years since quitting: 51.7   Smokeless tobacco: Never Used  Scientific laboratory technician Use: Never used  Substance and Sexual Activity   Alcohol use: No   Drug use: No   Sexual activity: Not on file  Other Topics Concern   Not on file  Social History Narrative   Not on file    Social Determinants of Health    Financial Resource Strain: Not on file  Food Insecurity: Not on file  Transportation Needs: Not on file  Physical Activity: Not on file  Stress: Not on file  Social Connections: Not on file  Housing Stability: Not on file          Current Outpatient Medications:    amLODIPine (NORVASC)  2.5 MG tablet, Take 2.5 mg by mouth daily., Disp: , Rfl:    clindamycin (CLEOCIN) 300 MG capsule, Take 300 mg by mouth 3 times daily., Disp: , Rfl:    clopidogreL (PLAVIX) 75 mg tablet  *ANTIPLATELET*, Take 75 mg by mouth daily., Disp: , Rfl:    HYDROcodone-acetaminophen (NORCO) 5-325 mg per tablet, TAKE 1 TABLET BY MOUTH EVERY 6 (SIX) HOURS AS NEEDED FOR SEVERE PAIN., Disp: , Rfl:    hydrOXYzine HCL (ATARAX) 25 MG tablet, TAKE 1 TABLET BY MOUTH AT BEDTIME AS NEEDED FOR ITCHING., Disp: , Rfl:    LORazepam (ATIVAN) 0.5 MG tablet, TAKE 1 TABLET BY MOUTH 2 TIMES DAILY AS NEEDED FOR ANXIETY., Disp: , Rfl:    losartan (COZAAR) 50 MG tablet, Take 50 mg by mouth daily., Disp: , Rfl:    metFORMIN (GLUCOPHAGE) 500 MG tablet, Take 500 mg by mouth daily with breakfast., Disp: , Rfl:    ondansetron (ZOFRAN-ODT) 8 MG disintegrating tablet, Dissolve 8 mg by mouth every 8 (eight) hours as needed., Disp: , Rfl:    predniSONE (DELTASONE)  10 MG tablet, PLEASE SEE ATTACHED FOR DETAILED DIRECTIONS, Disp: , Rfl:    tamsulosin (FLOMAX) 0.4 mg Cap capsule, Take 0.4 mg by mouth daily., Disp: , Rfl:    triamcinolone (KENALOG) 0.1 % paste, USE ON GUM ULCER 4 TIMES A DAY, Disp: , Rfl:    triamterene-hydrochlorothiazide (MAXZIDE-25) 37.5-25 mg per tablet, Take 0.5 tablets by mouth daily., Disp: , Rfl:    valACYclovir (VALTREX) 500 MG tablet, Take 500 mg by mouth 2 times daily., Disp: , Rfl:    A complete ROS was performed with pertinent positives/negatives noted in the HPI. The remainder of the ROS are negative.     Physical Exam:     BP 126/69   Pulse 96   Temp (!) 96.6 F (35.9 C)   Ht 1.88 m ('6\' 2"'$ )   Wt 81.6 kg (180 lb)   BMI 23.11 kg/m      General: Slightly lethargic appearing elderly gentleman, in no distress, breathing easily. Normal affect. In a pleasant mood. Head: Normocephalic, atraumatic. No masses, or scars. Eyes: Pupils are equal, and reactive to light. Vision is grossly intact. No spontaneous or  gaze nystagmus. Ears: Ear canals are clear. Tympanic membranes are intact, with normal landmarks and the middle ears are clear and healthy. Hearing: Grossly normal. Nose: Nasal cavities are clear with healthy mucosa, no polyps or exudate. Airways are patent. Face: No masses or scars, facial nerve function is symmetric. Oral Cavity: Surgical repair is intact.  There is no signs of infection.  Tongue mobility is limited due to the discomfort. Dentition appears healthy. Oropharynx:  There are no mucosal masses identified. Tongue base appears normal and healthy. Larynx/Hypopharynx: deferred Chest: Deferred Neck: No palpable masses, no cervical adenopathy, no thyroid nodules or enlargement. Neuro: Cranial nerves II-XII with normal function. Balance: Normal gate. Other findings: none.     Independent Review of Additional Tests or Records:  none   Procedures:  none     Impression & Plans:  Poor oral intake.  Options were discussed and we have agreed to admit him to the hospital for IV support and nasogastric feeding tube placement.  We are awaiting availability of a bed.

## 2021-02-18 NOTE — Procedures (Signed)
Cortrak  Person Inserting Tube:  Maylon Peppers C, RD Tube Type:  Cortrak - 43 inches Tube Size:  10 Tube Location:  Right nare Initial Placement:  Stomach Secured by: Bridle Technique Used to Measure Tube Placement:  Marking at nare/corner of mouth Cortrak Secured At:  71 cm   Cortrak Tube Team Note:  Consult received to place a Cortrak feeding tube.   X-ray is required, abdominal x-ray has been ordered by the Cortrak team. Please confirm tube placement before using the Cortrak tube.   If the tube becomes dislodged please keep the tube and contact the Cortrak team at [email or web address] ([email or web address]) for replacement.  If after hours and replacement cannot be delayed, place a NG tube and confirm placement with an abdominal x-ray.   Lockie Pares., RD, LDN, CNSC See [facility] for contact information

## 2021-02-18 NOTE — Progress Notes (Signed)
 Brief Nutrition Note  Consult received for enteral/tube feeding initiation and management.  Adult Enteral Nutrition Protocol initiated. Full assessment to follow.  Admitting Dx: Inadequate oral intake [R63.8]  Cortrak placed today for EN access.   Labs:  Recent Labs  Lab 02/18/21 1227  NA 136  K 4.1  CL 99  CO2 26  BUN 24*  CREATININE 1.23  CALCIUM 9.4  GLUCOSE 112*     Marion Rosenberry MS, RDN, LDN, CNSC Registered Dietitian III Clinical Nutrition RD Pager and On-Call Pager Number Located in [place]

## 2021-02-18 NOTE — Progress Notes (Signed)
Per Jinny Blossom (clinic RN with Dr Constance Holster) ok to do coretrak at bedside. Will place order and notify dietary.

## 2021-02-18 NOTE — Progress Notes (Signed)
Nasogastric feeding tube placed.  Positioning confirmed with x-ray.  We will start him on tube feeds.  We will make adjustments to pain medicine as well.

## 2021-02-19 ENCOUNTER — Other Ambulatory Visit (HOSPITAL_COMMUNITY): Payer: Medicare HMO

## 2021-02-19 ENCOUNTER — Encounter (HOSPITAL_COMMUNITY): Payer: Self-pay | Admitting: Otolaryngology

## 2021-02-19 DIAGNOSIS — Z79899 Other long term (current) drug therapy: Secondary | ICD-10-CM | POA: Diagnosis not present

## 2021-02-19 DIAGNOSIS — Z9889 Other specified postprocedural states: Secondary | ICD-10-CM | POA: Diagnosis not present

## 2021-02-19 DIAGNOSIS — Z7902 Long term (current) use of antithrombotics/antiplatelets: Secondary | ICD-10-CM | POA: Diagnosis not present

## 2021-02-19 DIAGNOSIS — Z20822 Contact with and (suspected) exposure to covid-19: Secondary | ICD-10-CM | POA: Diagnosis not present

## 2021-02-19 DIAGNOSIS — R633 Feeding difficulties, unspecified: Secondary | ICD-10-CM | POA: Diagnosis not present

## 2021-02-19 DIAGNOSIS — E440 Moderate protein-calorie malnutrition: Secondary | ICD-10-CM | POA: Diagnosis not present

## 2021-02-19 DIAGNOSIS — R1030 Lower abdominal pain, unspecified: Secondary | ICD-10-CM | POA: Diagnosis not present

## 2021-02-19 DIAGNOSIS — R638 Other symptoms and signs concerning food and fluid intake: Secondary | ICD-10-CM | POA: Diagnosis not present

## 2021-02-19 DIAGNOSIS — C411 Malignant neoplasm of mandible: Secondary | ICD-10-CM | POA: Diagnosis not present

## 2021-02-19 DIAGNOSIS — Z6823 Body mass index (BMI) 23.0-23.9, adult: Secondary | ICD-10-CM | POA: Diagnosis not present

## 2021-02-19 DIAGNOSIS — Z7984 Long term (current) use of oral hypoglycemic drugs: Secondary | ICD-10-CM | POA: Diagnosis not present

## 2021-02-19 LAB — GLUCOSE, CAPILLARY
Glucose-Capillary: 153 mg/dL — ABNORMAL HIGH (ref 70–99)
Glucose-Capillary: 136 mg/dL — ABNORMAL HIGH (ref 70–99)
Glucose-Capillary: 157 mg/dL — ABNORMAL HIGH (ref 70–99)
Glucose-Capillary: 137 mg/dL — ABNORMAL HIGH (ref 70–99)
Glucose-Capillary: 127 mg/dL — ABNORMAL HIGH (ref 70–99)

## 2021-02-19 LAB — MAGNESIUM
Magnesium: 1.9 mg/dL (ref 1.7–2.4)
Magnesium: 2.1 mg/dL (ref 1.7–2.4)

## 2021-02-19 LAB — PHOSPHORUS
Phosphorus: 3.1 mg/dL (ref 2.5–4.6)
Phosphorus: 3.1 mg/dL (ref 2.5–4.6)

## 2021-02-19 LAB — SURGICAL PATHOLOGY

## 2021-02-19 MED ORDER — FREE WATER
200.0000 mL | Status: DC
  Administered 2021-02-19 – 2021-02-27 (×33): 200 mL

## 2021-02-19 MED ORDER — PROSOURCE TF PO LIQD
45.0000 mL | Freq: Two times a day (BID) | ORAL | Status: DC
  Administered 2021-02-19 – 2021-02-27 (×16): 45 mL
  Filled 2021-02-19 (×18): qty 45

## 2021-02-19 MED ORDER — OSMOLITE 1.5 CAL PO LIQD
1000.0000 mL | ORAL | Status: DC
  Administered 2021-02-19 – 2021-02-27 (×8): 1000 mL
  Filled 2021-02-19 (×13): qty 1000

## 2021-02-19 NOTE — Progress Notes (Signed)
Initial Nutrition Assessment  DOCUMENTATION CODES:   Non-severe (moderate) malnutrition in context of chronic illness  INTERVENTION:   Advance diet as medically appropriate  Encourage good PO intake  TF via Cortrak: Osmolite 1.5 @ 75 mL/hr (1800 mL/day)  ProSource TF 45 mL - BID Provides 2780 kcal, 135 gm PRO, and 1368 mL free water  NUTRITION DIAGNOSIS:   Moderate Malnutrition related to chronic illness (Cancer) as evidenced by moderate fat depletion, moderate muscle depletion, energy intake < 75% for > or equal to 1 month.  GOAL:   Patient will meet greater than or equal to 90% of their needs   MONITOR:   TF tolerance, PO intake  REASON FOR ASSESSMENT:   Consult Enteral/tube feeding initiation and management  ASSESSMENT:   Pt had L mandibular alveolar cancer resection on 09/02; dc home and returned with decreased PO intake. PMH of HTN, GERD, and cancer.  09/06: Cortrak placed & TF started (Osmolite 1.2 @ 50 mL/hr)  Pt reports that he does not have an appetite and that his mouth hurts too much to eat at this time. He states that his stomach does feel better after starting the TF. Pt diet recall includes Breakfast: D[place] Clap breakfast bowl; Lunch: nothing; Dinner: frozen dinner; snack: ice cream sandwich. Pt currently unable to eat anything due to pain while swallowing post-op, even reports cold things hurt as well. Pt reports that he has lost 40# since last January and 6# since last Friday after his surgery. Per EMR since 07/17/2020, pt has had a 10.7% weight loss (considering weights to be accurate). Pt reports that he use to be very active playing tennis and pickleball until he had a stroke and a lot of tragedy last year that significantly impacted his weight loss. Pt now ambulates with a cane and is not as active.   Adjust TF to meet pt needs due to estimated energy needs.   Medications reviewed: SSI 0-15 units q4h  Labs reviewed.   NUTRITION - FOCUSED PHYSICAL  EXAM:  Flowsheet Row Most Recent Value  Orbital Region Moderate depletion  Upper Arm Region Moderate depletion  Thoracic and Lumbar Region Moderate depletion  Buccal Region Moderate depletion  Temple Region Moderate depletion  Clavicle Bone Region Mild depletion  Clavicle and Acromion Bone Region Mild depletion  Scapular Bone Region Mild depletion  Dorsal Hand Mild depletion  Patellar Region Moderate depletion  Anterior Thigh Region Moderate depletion  Posterior Calf Region Moderate depletion  Edema (RD Assessment) None  Hair Reviewed  Eyes Reviewed  Mouth Reviewed  Skin Reviewed  [dry,  flakey]  Nails Reviewed       Diet Order:   Diet Order             DIET SOFT Room service appropriate? Yes; Fluid consistency: Thin  Diet effective now                   EDUCATION NEEDS:   No education needs have been identified at this time  Skin:  Skin Assessment: Reviewed RN Assessment  Last BM:  Prior to Admission  Height:   Ht Readings from Last 1 Encounters:  02/18/21 '6\' 2"'$  (1.88 m)    Weight:   Wt Readings from Last 1 Encounters:  02/14/21 82.1 kg    Ideal Body Weight:  86.3 kg  BMI:  Body mass index is 23.24 kg/m.  Estimated Nutritional Needs:   Kcal:  [id number]  Protein:  125-145 gm  Fluid:  >/= 2 L  Hermina Barters BS, PLDN Clinical Dietitian See [facility] for contact information.

## 2021-02-19 NOTE — Progress Notes (Signed)
Subjective: Complaining of severe pain but it is relieved with the buspirone.  His stomach feels a lot better since surgery tube feeds.  Objective: Vital signs in last 24 hours: Temp:  [97.8 F (36.6 C)-98.8 F (37.1 C)] 98.8 F (37.1 C) (09/07 0836) Pulse Rate:  [71-88] 75 (09/07 0836) Resp:  [15-18] 18 (09/07 0836) BP: (126-180)/(69-95) 180/95 (09/07 0836) SpO2:  [94 %-97 %] 96 % (09/07 0836) Weight change:  Last BM Date: 02/13/21  Intake/Output from previous day: 09/06 0701 - 09/07 0700 In: 2388.1 [I.V.:1705.6; NG/GT:682.5] Out: 500 [Urine:500] Intake/Output this shift: Total I/O In: -  Out: 500 [Urine:500]  PHYSICAL EXAM: Is awake and alert.  Breathing easily.  No external swelling.  Is able to cough.  Lab Results: Recent Labs    02/18/21 1227  WBC 11.2*  HGB 13.5  HCT 40.3  PLT 298   BMET Recent Labs    02/18/21 1227  NA 136  K 4.1  CL 99  CO2 26  GLUCOSE 112*  BUN 24*  CREATININE 1.23  CALCIUM 9.4    Studies/Results: DG Abd Portable 1V  Result Date: 02/18/2021 CLINICAL DATA:  Feeding tube placement. EXAM: PORTABLE ABDOMEN - 1 VIEW COMPARISON: CT abdomen and pelvis 07/20/2017. FINDINGS: Feeding tube tip is at the level of the gastric antrum. No dilated bowel loops are seen in the upper abdomen. Degenerative changes affect the spine. IMPRESSION: 1. Feeding tube tip is at the level of the gastric antrum. Electronically Signed   By: Ronney Asters M.D.   On: 02/18/2021 16:24    Medications: I have reviewed the patient's current medications.  Assessment/Plan: Improving with tube feeds and intravenous morphine for analgesia.  Continue the course until he is able to take by mouth.  LOS: 1 day   Izora Gala 02/19/2021, 10:19 AM

## 2021-02-20 LAB — GLUCOSE, CAPILLARY
Glucose-Capillary: 130 mg/dL — ABNORMAL HIGH (ref 70–99)
Glucose-Capillary: 144 mg/dL — ABNORMAL HIGH (ref 70–99)
Glucose-Capillary: 150 mg/dL — ABNORMAL HIGH (ref 70–99)
Glucose-Capillary: 165 mg/dL — ABNORMAL HIGH (ref 70–99)
Glucose-Capillary: 166 mg/dL — ABNORMAL HIGH (ref 70–99)
Glucose-Capillary: 202 mg/dL — ABNORMAL HIGH (ref 70–99)

## 2021-02-20 LAB — PHOSPHORUS: Phosphorus: 3.6 mg/dL (ref 2.5–4.6)

## 2021-02-20 LAB — MAGNESIUM: Magnesium: 2 mg/dL (ref 1.7–2.4)

## 2021-02-20 MED ORDER — IBUPROFEN 100 MG/5ML PO SUSP
600.0000 mg | Freq: Four times a day (QID) | ORAL | Status: DC
  Administered 2021-02-20 – 2021-02-27 (×28): 600 mg
  Filled 2021-02-20 (×28): qty 30

## 2021-02-20 MED ORDER — BISACODYL 10 MG RE SUPP
10.0000 mg | Freq: Once | RECTAL | Status: AC
  Administered 2021-02-20: 10 mg via RECTAL
  Filled 2021-02-20: qty 1

## 2021-02-20 MED ORDER — OXYCODONE HCL 5 MG/5ML PO SOLN
10.0000 mg | Freq: Four times a day (QID) | ORAL | Status: DC | PRN
  Administered 2021-02-20 – 2021-02-27 (×18): 10 mg
  Filled 2021-02-20 (×19): qty 10

## 2021-02-20 MED ORDER — FREE WATER
200.0000 mL | Status: DC
  Administered 2021-02-20 – 2021-02-27 (×29): 200 mL

## 2021-02-20 NOTE — Evaluation (Signed)
Physical Therapy Evaluation Patient Details Name: Francis Sims. MRN: [id number] DOB: [birth year] Today's Date: 02/20/2021   History of Present Illness  80 yo admitted 9/6 after inability to eat or drink with Cortrak placed. 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 PMhx: CVA, PVD, HLD, HTN, neuropathy  Clinical Impression  Pt tolerated treatment well with pain in L jaw slightly affecting communication during session. Pt required min guard-minA with transfers and gait. Ambulated 300' with RW due to decreased balance with a shuffle gait. Pt's deficits include decreased mobility, balance, and strength. Pt would benefit from continued PT to improve upon deficits and function. Recommend HHPT with supervision/assistance for mobility given pt's support at home and current functional status.    Follow Up Recommendations Home health PT;Supervision/Assistance - 24 hour    Equipment Recommendations  Rolling walker with 5" wheels    Recommendations for Other Services       Precautions / Restrictions Precautions Precautions: Fall Precaution Comments: cortrak Restrictions Weight Bearing Restrictions: No      Mobility  Bed Mobility Overal bed mobility: Needs Assistance Bed Mobility: Supine to Sit     Supine to sit: HOB elevated;Min guard     General bed mobility comments: increased time and effort to scoot to EOB, HOB 30 degrees    Transfers Overall transfer level: Needs assistance   Transfers: Sit to/from Stand Sit to Stand: Min assist         General transfer comment: assist to rise from bed with increased effort  Ambulation/Gait Ambulation/Gait assistance: Min guard Gait Distance (Feet): 300 Feet Assistive device: Rolling walker (2 wheeled) Gait Pattern/deviations: Step-through pattern;Decreased stride length;Shuffle;Decreased dorsiflexion - right;Decreased dorsiflexion - left   Gait velocity interpretation: 1.31 - 2.62 ft/sec, indicative of  limited community ambulator General Gait Details: decreased foot clearance with flexed trunk and shuffling gait. verbal cues to maintain hips in RW  Stairs            Wheelchair Mobility    Modified Rankin (Stroke Patients Only)       Balance Overall balance assessment: Needs assistance Sitting-balance support: No upper extremity supported;Feet unsupported Sitting balance-Leahy Scale: Good     Standing balance support: Bilateral upper extremity supported;During functional activity Standing balance-Leahy Scale: Poor Standing balance comment: Reliant on RW during transfers and gait                             Pertinent Vitals/Pain Pain Assessment: 0-10 Pain Score: 10-Worst pain ever Pain Location: left jaw Pain Descriptors / Indicators: Guarding;Aching;Constant Pain Intervention(s): Limited activity within patient's tolerance;Monitored during session;Repositioned    Home Living Family/patient expects to be discharged to:: Private residence Living Arrangements: Alone Available Help at Discharge: Available 24 hours/day Type of Home: House Home Access: Stairs to enter Entrance Stairs-Rails: None Entrance Stairs-Number of Steps: 1 Home Layout: Able to live on main level with bedroom/bathroom;Two level Home Equipment: Walker - 4 wheels;Other (comment) (trekking pole and tri-point cane)      Prior Function Level of Independence: Independent with assistive device(s)         Comments: pt usually sleeps in recliner. uses cane when ambulating long distance and trekking pole for hiking on trail. Does not use any AD for home ambulation. Has rollator but does not use frequently.     Hand Dominance   Dominant Hand: Right    Extremity/Trunk Assessment   Upper Extremity Assessment Upper Extremity Assessment: Defer  to OT evaluation    Lower Extremity Assessment Lower Extremity Assessment: Generalized weakness    Cervical / Trunk Assessment Cervical /  Trunk Assessment: Normal  Communication   Communication: No difficulties;Other (comment) (pain in L cheek limiting communication during session)  Cognition Arousal/Alertness: Awake/alert Behavior During Therapy: Flat affect Overall Cognitive Status: Within Functional Limits for tasks assessed                                 General Comments: Followed one-step commands accurately and required verbal cueing for ambulation      General Comments General comments (skin integrity, edema, etc.): VSS on RA    Exercises     Assessment/Plan    PT Assessment Patient needs continued PT services  PT Problem List Decreased strength;Decreased balance;Decreased mobility;Decreased activity tolerance       PT Treatment Interventions DME instruction;Gait training;Stair training;Functional mobility training;Therapeutic activities;Therapeutic exercise;Balance training    PT Goals (Current goals can be found in the Care Plan section)  Acute Rehab PT Goals Patient Stated Goal: Return to hiking and going to gym PT Goal Formulation: With patient Time For Goal Achievement: 03/06/21 Potential to Achieve Goals: Good    Frequency Min 3X/week   Barriers to discharge        Co-evaluation               AM-PAC PT "6 Clicks" Mobility  Outcome Measure Help needed turning from your back to your side while in a flat bed without using bedrails?: A Little Help needed moving from lying on your back to sitting on the side of a flat bed without using bedrails?: A Little Help needed moving to and from a bed to a chair (including a wheelchair)?: A Little Help needed standing up from a chair using your arms (e.g., wheelchair or bedside chair)?: A Little Help needed to walk in hospital room?: A Little Help needed climbing 3-5 steps with a railing? : A Little 6 Click Score: 18    End of Session Equipment Utilized During Treatment: Gait belt Activity Tolerance: Patient tolerated treatment  well Patient left: in chair;with call bell/phone within reach;with chair alarm set Nurse Communication: Mobility status PT Visit Diagnosis: Other abnormalities of gait and mobility (R26.89);Unsteadiness on feet (R26.81);Muscle weakness (generalized) (M62.81)    Time: [id number] PT Time Calculation (min) (ACUTE ONLY): 23 min   Charges:   PT Evaluation $PT Eval Moderate Complexity: 1 Mod          Elazar Argabright F, SPT Acute Rehab: [phone] [id number]   Domingo Dimes 02/20/2021, 1:50 PM

## 2021-02-20 NOTE — Progress Notes (Signed)
Subjective: Still experiencing significant pain in the surgical site.  He is now having some lower abdominal pain.  He has not had a bowel movement.  He has been passing small amount of gas.  He has been taking very small amounts by mouth.  He is tolerating tube feeds reasonably well.  Objective: Vital signs in last 24 hours: Temp:  [97.6 F (36.4 C)-98.4 F (36.9 C)] 98.2 F (36.8 C) (09/08 0817) Pulse Rate:  [66-80] 80 (09/08 0817) Resp:  [17-18] 18 (09/08 0817) BP: (147-163)/(65-87) 158/77 (09/08 0817) SpO2:  [92 %-95 %] 93 % (09/08 0817) Weight:  [80.4 kg-82.7 kg] 80.4 kg (09/08 0438) Weight change:  Last BM Date: 02/13/21  Intake/Output from previous day: 09/07 0701 - 09/08 0700 In: 1149.2 [P.O.:30; NG/GT:1119.2] Out: 2100 [Urine:2100] Intake/Output this shift: No intake/output data recorded.  PHYSICAL EXAM: Awake and alert, breathing and talking clearly.  No swelling in the face or neck.  Surgical site is healing appropriately.  Abdomen nondistended.  Lab Results: Recent Labs    02/18/21 1227  WBC 11.2*  HGB 13.5  HCT 40.3  PLT 298   BMET Recent Labs    02/18/21 1227  NA 136  K 4.1  CL 99  CO2 26  GLUCOSE 112*  BUN 24*  CREATININE 1.23  CALCIUM 9.4    Studies/Results: DG Abd Portable 1V  Result Date: 02/18/2021 CLINICAL DATA:  Feeding tube placement. EXAM: PORTABLE ABDOMEN - 1 VIEW COMPARISON: CT abdomen and pelvis 07/20/2017. FINDINGS: Feeding tube tip is at the level of the gastric antrum. No dilated bowel loops are seen in the upper abdomen. Degenerative changes affect the spine. IMPRESSION: 1. Feeding tube tip is at the level of the gastric antrum. Electronically Signed   By: Ronney Asters M.D.   On: 02/18/2021 16:24    Medications: I have reviewed the patient's current medications.  Assessment/Plan: Continue monitoring pain and adjusting medications.  We will try laxatives today.  We will increase fluid intake.  We will get physical therapy to  assist with getting up moving.  LOS: 2 days   Izora Gala 02/20/2021, 8:54 AM

## 2021-02-21 LAB — BASIC METABOLIC PANEL
Anion gap: 12 (ref 5–15)
BUN: 20 mg/dL (ref 8–23)
CO2: 27 mmol/L (ref 22–32)
Calcium: 9.1 mg/dL (ref 8.9–10.3)
Chloride: 97 mmol/L — ABNORMAL LOW (ref 98–111)
Creatinine, Ser: 0.84 mg/dL (ref 0.61–1.24)
GFR, Estimated: >60 mL/min (ref >60)
Glucose, Bld: 212 mg/dL — ABNORMAL HIGH (ref 70–99)
Potassium: 4.2 mmol/L (ref 3.5–5.1)
Sodium: 136 mmol/L (ref 135–145)

## 2021-02-21 LAB — GLUCOSE, CAPILLARY
Glucose-Capillary: 199 mg/dL — ABNORMAL HIGH (ref 70–99)
Glucose-Capillary: 237 mg/dL — ABNORMAL HIGH (ref 70–99)
Glucose-Capillary: 201 mg/dL — ABNORMAL HIGH (ref 70–99)
Glucose-Capillary: 114 mg/dL — ABNORMAL HIGH (ref 70–99)
Glucose-Capillary: 192 mg/dL — ABNORMAL HIGH (ref 70–99)

## 2021-02-21 LAB — CBC
HCT: 39.7 % (ref 39.0–52.0)
Hemoglobin: 13.3 g/dL (ref 13.0–17.0)
MCH: 28 pg (ref 26.0–34.0)
MCHC: 33.5 g/dL (ref 30.0–36.0)
MCV: 83.6 fL (ref 80.0–100.0)
Platelets: 302 10*3/uL (ref 150–400)
RBC: 4.75 MIL/uL (ref 4.22–5.81)
RDW: 13 % (ref 11.5–15.5)
WBC: 15.1 10*3/uL — ABNORMAL HIGH (ref 4.0–10.5)
nRBC: 0 % (ref 0.0–0.2)

## 2021-02-21 MED ORDER — OXYCODONE HCL 5 MG/5ML PO SOLN: 10.0000 mg | Freq: Four times a day (QID) | ORAL | 0 refills | Status: DC | PRN

## 2021-02-21 NOTE — Progress Notes (Signed)
Pt attempted a few sps and bites today (strawberry ensure, applesauce). Pt needed continual encouragement to try to sip or take a bite of something. He stated he would after pain medication. Pt drank about 7 sips of Ensure and 4-5 bites of applesauce throughout the shift.  He had about a half a cup of water total on dayshift.  Pt needed reminded to use incentive spirometer, but pt does know how to use it properly without prompting.  Pt has a strong, productive cough.  Daughter, Juliann Pulse, visited today. She would like a phone call for an update on Saturday 09/10. She will not be visiting so that two of pt's friends can come see him,  but she is available at number listed on pt chart.  Pt cried this morning, taking about missing his wife. She passed of cancer in June 2022.  (Per daughter, pt's wife passed in the hospital under Hospice care, and was wondering how he was doing with being in the hospital, and if he could be assessed for depression.)

## 2021-02-21 NOTE — Progress Notes (Signed)
Subjective: Is doing a lot better today.  He had a bowel movement yesterday and the stomach is not bothering him any longer.  He was up walking yesterday.  Pain is much better controlled.  Objective: Vital signs in last 24 hours: Temp:  [97.6 F (36.4 C)-98.7 F (37.1 C)] 97.6 F (36.4 C) (09/09 0845) Pulse Rate:  [65-99] 82 (09/09 0845) Resp:  [16-18] 18 (09/09 0845) BP: (138-173)/(72-80) 157/72 (09/09 0845) SpO2:  [91 %-97 %] 93 % (09/09 0845) Weight:  [81.5 kg] 81.5 kg (09/09 0438) Weight change: -1.2 kg Last BM Date: 02/20/21  Intake/Output from previous day: 09/08 0701 - 09/09 0700 In: 1362 [P.O.:30; NG/GT:1332] Out: 1150 [Urine:1150] Intake/Output this shift: Total I/O In: -  Out: 325 [Urine:175; Other:150]  PHYSICAL EXAM: No change on exam.  He was able to eat applesauce. Lab Results: Recent Labs    02/18/21 1227 02/21/21 0333  WBC 11.2* 15.1*  HGB 13.5 13.3  HCT 40.3 39.7  PLT 298 302   BMET Recent Labs    02/18/21 1227 02/21/21 0333  NA 136 136  K 4.1 4.2  CL 99 97*  CO2 26 27  GLUCOSE 112* 212*  BUN 24* 20  CREATININE 1.23 0.84  CALCIUM 9.4 9.1    Studies/Results: No results found.  Medications: I have reviewed the patient's current medications.  Assessment/Plan: Continues to improve.  If he is able to eat and drink adequately as the day goes on then we can remove the feeding tube and send him home tomorrow.  LOS: 3 days   Izora Gala 02/21/2021, 12:16 PM
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
## 2021-02-21 NOTE — Progress Notes (Signed)
Nutrition Follow-up  DOCUMENTATION CODES:   Non-severe (moderate) malnutrition in context of chronic illness  INTERVENTION:   -Advance diet as medically appropriate  -Encourage high calorie, high protein foods  -Ensure Plus (strawberry) - TID  -TF via Cortrak: Osmolite 1.5 @ 75 mL/hr (1800 mL/day)  ProSource TF 45 mL - BID Provides 2780 kcal, 135 gm PRO, and 1368 mL free water  NUTRITION DIAGNOSIS:   Moderate Malnutrition related to chronic illness (Cancer) as evidenced by moderate fat depletion, moderate muscle depletion, energy intake < 75% for > or equal to 1 month. - Ongoing  GOAL:   Patient will meet greater than or equal to 90% of their needs - Progressing  MONITOR:   TF tolerance, PO intake  REASON FOR ASSESSMENT:   Consult Enteral/tube feeding initiation and management  ASSESSMENT:   Pt had L mandibular alveolar cancer resection on 09/02; dc home and returned with decreased PO intake. PMH of HTN, GERD, and cancer.  09/06: Cortrak placed & TF started (Osmolite 1.2 @ 50 mL/hr) 09/07: TF Osmolite 1.5 '@75'$  mL & 90 mL ProSource TF  Per MD note, pt possible discharge tomorrow 09/10 if eating and drinking ok.  Pt with documented 0% PO intake. Pt reports that he is still unable to eat many foods, especially anything cold. He has tried applesauce and is willing to try and Ensure Plus. Tolerating TF well. Discussed nutrition care after discharge, pt will need additional calories and protein to help heal and reduce risk of loosing any more weight.   Pt is not meeting estimated needs via PO intake, relying on TF to meet needs.   Pt abdominal pain subsided after large BM on 09/08.   Medications reviewed: NovoLog SSI 0-15 units q4h  Labs reviewed: 24 hr BG trends 144-237 mg/dL  Diet Order:   Diet Order             DIET SOFT Room service appropriate? Yes; Fluid consistency: Thin  Diet effective now                   EDUCATION NEEDS:   No education needs  have been identified at this time  Skin:  Skin Assessment: Reviewed RN Assessment  Last BM:  09/08  Height:   Ht Readings from Last 1 Encounters:  02/18/21 '6\' 2"'$  (1.88 m)    Weight:   Wt Readings from Last 1 Encounters:  02/21/21 81.5 kg    Ideal Body Weight:  86.3 kg  BMI:  Body mass index is 23.07 kg/m.  Estimated Nutritional Needs:   Kcal:  [id number]  Protein:  125-145 gm  Fluid:  >/= 2 L    Rally Ouch BS, PLDN Clinical Dietitian See AMiON for contact information.

## 2021-02-21 NOTE — Discharge Instructions (Addendum)
Eat as much soft food as you are able to including pudding, ice cream, mashed potatoes, whole milk with Carnation instant breakfast, Ensure Plus, etc.  Nutrition [facility] Stay Proper nutrition can help your body recover from illness and injury.   Foods and beverages high in protein, vitamins, and minerals help rebuild muscle loss, promote healing, & reduce fall risk.   In addition to eating healthy foods, a nutrition shake is an easy, delicious way to get the nutrition you need during and after your hospital stay  It is recommended that you continue to drink 2 bottles per day of: Ensure Plus for at least 1 month (30 days) after your hospital stay   Tips for adding a nutrition shake into your routine: As allowed, drink one with vitamins or medications instead of water or juice Enjoy one as a tasty mid-morning or afternoon snack Drink cold or make a milkshake out of it Drink one instead of milk with cereal or snacks Use as a coffee creamer   Available at the following grocery stores and pharmacies:           * [facility] [phone]            For COUPONS visit: www.ensure.com/join or [email or web address]   Suggested Substitutions Ensure Plus = Boost Plus = Carnation Breakfast Essentials = Boost Compact

## 2021-02-21 NOTE — Progress Notes (Signed)
Spoke with daughter

## 2021-02-21 NOTE — Care Management Important Message (Signed)
Important Message  Patient Details  Name: Francis Sims. MRN: [id number] Date of Birth: May 02, 1941   Medicare Important Message Given:  Yes     Rie Mcneil 02/21/2021, 4:24 PM

## 2021-02-22 LAB — GLUCOSE, CAPILLARY
Glucose-Capillary: 165 mg/dL — ABNORMAL HIGH (ref 70–99)
Glucose-Capillary: 167 mg/dL — ABNORMAL HIGH (ref 70–99)
Glucose-Capillary: 178 mg/dL — ABNORMAL HIGH (ref 70–99)
Glucose-Capillary: 199 mg/dL — ABNORMAL HIGH (ref 70–99)
Glucose-Capillary: 205 mg/dL — ABNORMAL HIGH (ref 70–99)
Glucose-Capillary: 226 mg/dL — ABNORMAL HIGH (ref 70–99)
Glucose-Capillary: 268 mg/dL — ABNORMAL HIGH (ref 70–99)

## 2021-02-22 MED ORDER — MORPHINE SULFATE (PF) 2 MG/ML IV SOLN
2.0000 mg | INTRAVENOUS | Status: DC | PRN
  Administered 2021-02-23 – 2021-02-24 (×2): 2 mg via INTRAVENOUS
  Filled 2021-02-22 (×4): qty 1

## 2021-02-22 MED ORDER — DOCUSATE SODIUM 50 MG/5ML PO LIQD: 100.0000 mg | Freq: Two times a day (BID) | ORAL | Status: DC | PRN

## 2021-02-22 MED ORDER — ACETAMINOPHEN 160 MG/5ML PO SOLN
650.0000 mg | Freq: Four times a day (QID) | ORAL | Status: DC
  Administered 2021-02-22 – 2021-02-27 (×21): 650 mg
  Filled 2021-02-22 (×21): qty 20.3

## 2021-02-22 NOTE — Progress Notes (Signed)
ENT PROGRESS NOTE   Subjective: Patient seen and examined at bedside. No issues overnight. Reports pain in oral cavity is still quite severe, improved with pain meds but then returns shortly afterwards. States he has been able to eat very little by mouth secondary to pain. Denies abdominal pain. States he has been up out of bed to chair and ambulating without issue.   Objective: Vital signs in last 24 hours: Temp:  [97.5 F (36.4 C)-98.7 F (37.1 C)] 98.5 F (36.9 C) (09/10 0838) Pulse Rate:  [72-78] 77 (09/10 0838) Resp:  [17-18] 18 (09/10 0838) BP: (138-165)/(70-75) 165/75 (09/10 0838) SpO2:  [95 %-97 %] 97 % (09/10 0838) Weight:  [82.6 kg] 82.6 kg (09/10 0431)  CONSTITUTIONAL: No acute distress and alert and oriented x 3 PULMONARY/CHEST WALL: effort normal and no stridor, no stertor, no dysphonia HENT: Head : normocephalic and atraumatic Mouth/Throat:  Mouth: intraoral incision intact with sutures in place, no evidence of dehiscence, no bleeding EYES: conjunctiva normal, EOM normal and PERRL NECK: supple, no edema  Recent Labs    02/21/21 0333  NA 136  K 4.2  CL 97*  CO2 27  GLUCOSE 212*  BUN 20  CREATININE 0.84  CALCIUM 9.1    Medications: I have reviewed the patient's current medications.  New Imaging: None  Assessment/Plan: Francis Sims is a 80 y/o  M s/p resection of left mandibular alveolar cancer on 09/02, admitted for poor oral intake -Continue inpatient monitoring, oral intake not adequate for discharge -Encourage PO, ambulation with assist to tolerance  -Continue tube feeds via NG -Nutrition following, appreciate assistance    LOS: 4 days    Jason Coop, [facility] 02/22/2021, 10:52 AM

## 2021-02-23 LAB — GLUCOSE, CAPILLARY
Glucose-Capillary: 190 mg/dL — ABNORMAL HIGH (ref 70–99)
Glucose-Capillary: 198 mg/dL — ABNORMAL HIGH (ref 70–99)
Glucose-Capillary: 213 mg/dL — ABNORMAL HIGH (ref 70–99)
Glucose-Capillary: 223 mg/dL — ABNORMAL HIGH (ref 70–99)
Glucose-Capillary: 281 mg/dL — ABNORMAL HIGH (ref 70–99)
Glucose-Capillary: 283 mg/dL — ABNORMAL HIGH (ref 70–99)

## 2021-02-23 MED ORDER — LIDOCAINE VISCOUS HCL 2 % MT SOLN
15.0000 mL | Freq: Four times a day (QID) | OROMUCOSAL | Status: DC | PRN
  Administered 2021-02-23: 15 mL via OROMUCOSAL
  Filled 2021-02-23 (×2): qty 15

## 2021-02-23 NOTE — Progress Notes (Signed)
ENT PROGRESS NOTE   Subjective: Patient seen and examined at bedside. No issues overnight. States pain during day yesterday was improved with addition of tylenol, but reports pain is worse this morning. He had 393m oral intake yesterday. Does not feel comfortable being discharged home yet secondary to pain and difficulty eating. Denies abdominal pain, constipation. States he was up out of bed to chair yesterday, but did not ambulate.   Objective: Vital signs in last 24 hours: Temp:  [98 F (36.7 C)-99.1 F (37.3 C)] 98.5 F (36.9 C) (09/11 0750) Pulse Rate:  [78-94] 82 (09/11 0750) Resp:  [16-19] 16 (09/11 0750) BP: (143-168)/(71-74) 159/73 (09/11 0750) SpO2:  [94 %-96 %] 95 % (09/11 0750) Weight:  [83.2 kg] 83.2 kg (09/11 0436)  CONSTITUTIONAL: No acute distress and alert and oriented x 3 PULMONARY/CHEST WALL: effort normal and no stridor, no stertor, no dysphonia HENT: Head : normocephalic and atraumatic Mouth/Throat:  Mouth: intraoral incision intact with sutures in place, no bleeding. Mild left lateral tongue edema noted. EYES: conjunctiva normal, EOM normal and PERRL NECK: supple, no edema  Recent Labs    02/21/21 0333  NA 136  K 4.2  CL 97*  CO2 27  GLUCOSE 212*  BUN 20  CREATININE 0.84  CALCIUM 9.1     Medications: I have reviewed the patient's current medications.  New Imaging: None  Assessment/Plan: Francis Sims a 80y/o  M s/p resection of left mandibular alveolar cancer on 09/02, admitted for poor oral intake -Continue inpatient monitoring, oral intake not adequate for discharge -Encourage PO, ambulation with assist to tolerance  -Encourage incentive spirometer use  -Continue tube feeds via NG  -Nutrition following, appreciate assistance  -Patient requests suction canister at home, consult for home health care placed.     LOS: 5 days    MSurrencyENT 02/23/2021, 12:15 PM

## 2021-02-24 LAB — GLUCOSE, CAPILLARY
Glucose-Capillary: 182 mg/dL — ABNORMAL HIGH (ref 70–99)
Glucose-Capillary: 291 mg/dL — ABNORMAL HIGH (ref 70–99)
Glucose-Capillary: 256 mg/dL — ABNORMAL HIGH (ref 70–99)
Glucose-Capillary: 257 mg/dL — ABNORMAL HIGH (ref 70–99)
Glucose-Capillary: 174 mg/dL — ABNORMAL HIGH (ref 70–99)
Glucose-Capillary: 254 mg/dL — ABNORMAL HIGH (ref 70–99)

## 2021-02-24 MED ORDER — WHITE PETROLATUM EX OINT
TOPICAL_OINTMENT | CUTANEOUS | Status: AC
  Filled 2021-02-24: qty 28.35

## 2021-02-24 NOTE — Discharge Summary (Signed)
Physician Discharge Summary  Patient ID: Francis Sims. MRN: [id number] DOB/AGE: March 20, 1941 80 y.o.  Admit date: 02/14/2021 Discharge date: 02/24/2021  Admission Diagnoses: Oral cancer  Discharge Diagnoses:  Active Problems:   Oral cancer [facility])   Discharged Condition: good  Hospital Course: No complications  Consults: none  Significant Diagnostic Studies: none  Treatments: surgery: Partial mandibular alveolectomy  Discharge Exam: Blood pressure 118/61, pulse 73, temperature (!) 97.2 F (36.2 C), resp. rate 16, height '6\' 2"'$  (1.88 m), weight 82.1 kg, SpO2 98 %. PHYSICAL EXAM: Awake and alert.  Complaining of some pain but feels well enough to go home and would like to go home.  Disposition:    Allergies as of 02/14/2021       Reactions   Penicillins Hives, Swelling   Sulfonamide Derivatives Hives   Almost stopped heart in the service..Also, causes hives   Metoprolol    fatigue   Hydroxyzine    Too strong   Ibuprofen Itching   Pt states he can take ibuprofen   Statins    aches        Medication List     TAKE these medications    amLODipine 2.5 MG tablet Commonly known as: NORVASC Take 1 tablet (2.5 mg total) by mouth daily.   cetirizine 10 MG tablet Commonly known as: ZyrTEC Allergy Take 1 tablet (10 mg total) by mouth daily. What changed:  when to take this reasons to take this   cholecalciferol 1000 units tablet Commonly known as: VITAMIN D Take 1,000 Units by mouth daily.   clindamycin 300 MG capsule Commonly known as: Cleocin Take 1 capsule (300 mg total) by mouth 3 (three) times daily.   clopidogrel 75 MG tablet Commonly known as: PLAVIX TAKE 1 TABLET BY MOUTH EVERY DAY   docusate sodium 100 MG capsule Commonly known as: COLACE Take 100 mg by mouth 2 (two) times daily.   Fish Oil 1000 MG Caps Take 1,000 mg by mouth 2 (two) times daily.   HYDROcodone-acetaminophen 7.5-325 MG tablet Commonly known as: Norco Take 1 tablet by mouth  every 6 (six) hours as needed for moderate pain.   hydroxypropyl methylcellulose / hypromellose 2.5 % ophthalmic solution Commonly known as: ISOPTO TEARS / GONIOVISC Place 1 drop into both eyes 3 (three) times daily as needed for dry eyes.   hydrOXYzine 25 MG tablet Commonly known as: ATARAX/VISTARIL TAKE 1 TABLET BY MOUTH AT BEDTIME AS NEEDED FOR ITCHING. What changed:  how much to take how to take this when to take this additional instructions   LORazepam 0.5 MG tablet Commonly known as: ATIVAN Take 1 tablet (0.5 mg total) by mouth 2 (two) times daily as needed for anxiety.   losartan 50 MG tablet Commonly known as: COZAAR Take 1 tablet (50 mg total) by mouth daily.   metFORMIN 500 MG tablet Commonly known as: GLUCOPHAGE Take 1 tablet (500 mg total) by mouth daily with breakfast.   omeprazole 40 MG capsule Commonly known as: PRILOSEC TAKE ONE CAPSULE BY MOUTH EVERY DAY AS NEEDED What changed: reasons to take this   ondansetron 8 MG disintegrating tablet Commonly known as: Zofran ODT Take 1 tablet (8 mg total) by mouth every 8 (eight) hours as needed for nausea or vomiting.   pregabalin 50 MG capsule Commonly known as: Lyrica Take 1 capsule (50 mg total) by mouth 3 (three) times daily.   tamsulosin 0.4 MG Caps capsule Commonly known as: FLOMAX TAKE 1 CAPSULE BY MOUTH EVERY DAY  triamterene-hydrochlorothiazide 37.5-25 MG tablet Commonly known as: Maxzide-25 Take 0.5 tablets by mouth daily.   valACYclovir 500 MG tablet Commonly known as: Valtrex Take 1 tablet (500 mg total) by mouth 2 (two) times daily.   vitamin C 250 MG tablet Commonly known as: ASCORBIC ACID Take 250 mg by mouth daily.        Follow-up Information     Izora Gala, MD Follow up in 1 week(s).   Specialty: Otolaryngology Contact information: [address] [place] [address] [phone]                 Signed: Izora Gala 02/24/2021, 11:02 AM

## 2021-02-24 NOTE — Care Management (Addendum)
Received a consult for suction cannister at home. Will need MD order and clarification , secure chatted DR Constance Holster and Dr Fredric Dine   Patient had requested it to manage secretions , advised by Dr Constance Holster to wait to see if needed at discharge. If needed will need MD order.

## 2021-02-24 NOTE — Progress Notes (Signed)
Subjective: He continues to complain of significant pain.  He is able to eat and drink small amounts.  He has been up walking.  Objective: Vital signs in last 24 hours: Temp:  [97.5 F (36.4 C)-98.2 F (36.8 C)] 97.5 F (36.4 C) (09/12 0835) Pulse Rate:  [78-100] 78 (09/12 0835) Resp:  [16-18] 16 (09/12 0835) BP: (133-167)/(65-78) 138/69 (09/12 0835) SpO2:  [94 %-96 %] 95 % (09/12 0835) Weight change:  Last BM Date: 02/23/21  Intake/Output from previous day: 09/11 0701 - 09/12 0700 In: 2180 [P.O.:20; NG/GT:2160] Out: 1050 [Urine:1050] Intake/Output this shift: No intake/output data recorded.  PHYSICAL EXAM: Awake and alert.  The oral closure looks good but there is a superficial ulceration of the posterior lateral tongue, possibly from rubbing against his tooth.  Lab Results: No results for input(s): WBC, HGB, HCT, PLT in the last 72 hours. BMET No results for input(s): NA, K, CL, CO2, GLUCOSE, BUN, CREATININE, CALCIUM in the last 72 hours.  Studies/Results: No results found.  Medications: I have reviewed the patient's current medications.  Assessment/Plan: Continue NG tube feeds, continue to monitor oral input and will plan on discharge when he is able to sustain himself.  LOS: 5 days   Izora Gala 02/24/2021, 11:00 AM

## 2021-02-24 NOTE — Progress Notes (Signed)
Physical Therapy Treatment Patient Details Name: Francis Sims. MRN: [id number] DOB: August 11, 1940 Today's Date: 02/24/2021   History of Present Illness 80 yo admitted 9/6 after inability to eat or drink with Cortrak placed. 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 PMhx: CVA, PVD, HLD, HTN, neuropathy    PT Comments    Patient received in bed, pleasant and cooperative, but more fatigued today from being up in recliner earlier this morning. Agreeable to gait training but too fatigued to progress distance. Needed Mod cues for safety with RW (tends to push it far too far ahead of him) and gait mechanics. Does have L residual hemiparesis from a prior CVA that worsens with fatigue and really throws him off balance- energy conservation education will be important moving forward. Left in bed per his request with all needs met, bed alarm active. Will continue efforts.     Recommendations for follow up therapy are one component of a multi-disciplinary discharge planning process, led by the attending physician.  Recommendations may be updated based on patient status, additional functional criteria and insurance authorization.  Follow Up Recommendations  Home health PT;Supervision/Assistance - 24 hour     Equipment Recommendations  Rolling walker with 5" wheels    Recommendations for Other Services       Precautions / Restrictions Precautions Precautions: Fall Precaution Comments: cortrak Restrictions Weight Bearing Restrictions: No     Mobility  Bed Mobility Overal bed mobility: Needs Assistance Bed Mobility: Supine to Sit;Sit to Supine     Supine to sit: HOB elevated;Min guard Sit to supine: HOB elevated;Min guard   General bed mobility comments: increased time and effort to scoot to EOB, HOB 30 degrees    Transfers Overall transfer level: Needs assistance   Transfers: Sit to/from Stand Sit to Stand: Min assist;From elevated surface          General transfer comment: MinA to boost to standing from elevated bed, uses  L UE to counter balance and get more anterior weight shift; generally tremulous  Ambulation/Gait Ambulation/Gait assistance: Min guard Gait Distance (Feet): 250 Feet Assistive device: Rolling walker (2 wheeled) Gait Pattern/deviations: Step-through pattern;Decreased stride length;Shuffle;Decreased dorsiflexion - right;Decreased dorsiflexion - left Gait velocity: decreased   General Gait Details: more fatigued and unable to tolerate progression of gait distance today- does have L sided hemiparesis from prior CVA and mechanics tend to worsen/tends to drag left foot behind him as he fatigues. Needed Mod cues for safe proximity to RW as fatigue increased as well.   Stairs             Wheelchair Mobility    Modified Rankin (Stroke Patients Only)       Balance Overall balance assessment: Needs assistance Sitting-balance support: No upper extremity supported;Feet unsupported Sitting balance-Leahy Scale: Good     Standing balance support: Bilateral upper extremity supported;During functional activity Standing balance-Leahy Scale: Poor Standing balance comment: Reliant on RW during transfers and gait                            Cognition Arousal/Alertness: Awake/alert Behavior During Therapy: Flat affect Overall Cognitive Status: Within Functional Limits for tasks assessed                                 General Comments: able to follow cues well, a bit difficult to understand given mandibular hx  Exercises      General Comments        Pertinent Vitals/Pain Pain Assessment: Faces Faces Pain Scale: No hurt Pain Intervention(s): Limited activity within patient's tolerance;Monitored during session    Home Living                      Prior Function            PT Goals (current goals can now be found in the care plan section) Acute Rehab PT  Goals Patient Stated Goal: Return to hiking and going to gym PT Goal Formulation: With patient Time For Goal Achievement: 03/06/21 Potential to Achieve Goals: Good Progress towards PT goals: Progressing toward goals    Frequency    Min 3X/week      PT Plan Current plan remains appropriate    Co-evaluation              AM-PAC PT "6 Clicks" Mobility   Outcome Measure  Help needed turning from your back to your side while in a flat bed without using bedrails?: A Little Help needed moving from lying on your back to sitting on the side of a flat bed without using bedrails?: A Little Help needed moving to and from a bed to a chair (including a wheelchair)?: A Little Help needed standing up from a chair using your arms (e.g., wheelchair or bedside chair)?: A Little Help needed to walk in hospital room?: A Little Help needed climbing 3-5 steps with a railing? : A Little 6 Click Score: 18    End of Session   Activity Tolerance: Patient tolerated treatment well Patient left: in bed;with call bell/phone within reach;with bed alarm set Nurse Communication: Mobility status PT Visit Diagnosis: Other abnormalities of gait and mobility (R26.89);Unsteadiness on feet (R26.81);Muscle weakness (generalized) (M62.81)     Time: 0258-5277 PT Time Calculation (min) (ACUTE ONLY): 23 min  Charges:  $Gait Training: 8-22 mins $Therapeutic Activity: 8-22 mins                    Windell Norfolk, DPT, PN2   Supplemental Physical Therapist [place]    Pager [phone] Acute Rehab Office [phone]

## 2021-02-25 DIAGNOSIS — E440 Moderate protein-calorie malnutrition: Secondary | ICD-10-CM | POA: Insufficient documentation

## 2021-02-25 LAB — GLUCOSE, CAPILLARY
Glucose-Capillary: 190 mg/dL — ABNORMAL HIGH (ref 70–99)
Glucose-Capillary: 217 mg/dL — ABNORMAL HIGH (ref 70–99)
Glucose-Capillary: 225 mg/dL — ABNORMAL HIGH (ref 70–99)
Glucose-Capillary: 228 mg/dL — ABNORMAL HIGH (ref 70–99)
Glucose-Capillary: 242 mg/dL — ABNORMAL HIGH (ref 70–99)
Glucose-Capillary: 243 mg/dL — ABNORMAL HIGH (ref 70–99)
Glucose-Capillary: 250 mg/dL — ABNORMAL HIGH (ref 70–99)

## 2021-02-25 MED ORDER — LIDOCAINE VISCOUS HCL 2 % MT SOLN
15.0000 mL | OROMUCOSAL | Status: DC
  Administered 2021-02-25: 15 mL via OROMUCOSAL
  Filled 2021-02-25 (×18): qty 15

## 2021-02-25 NOTE — Progress Notes (Signed)
Subjective: Continues to experience significant pain.  He is eating and drinking a little bit better than yesterday.  He is having bowel movements.  Objective: Vital signs in last 24 hours: Temp:  [97.4 F (36.3 C)-98.4 F (36.9 C)] 97.6 F (36.4 C) (09/13 0836) Pulse Rate:  [70-87] 82 (09/13 0836) Resp:  [15-18] 16 (09/13 0836) BP: (138-169)/(76-100) 169/85 (09/13 0836) SpO2:  [95 %-98 %] 96 % (09/13 0836) Weight change:  Last BM Date: 02/23/21  Intake/Output from previous day: 09/12 0701 - 09/13 0700 In: 2342.5 [P.O.:200; NG/GT:2142.5] Out: 550 [Urine:200; Emesis/NG output:350] Intake/Output this shift: No intake/output data recorded.  PHYSICAL EXAM: Awake and alert.  Breathing and voice are clear.  No external swelling.  Lab Results: No results for input(s): WBC, HGB, HCT, PLT in the last 72 hours. BMET No results for input(s): NA, K, CL, CO2, GLUCOSE, BUN, CREATININE, CALCIUM in the last 72 hours.  Studies/Results: No results found.  Medications: I have reviewed the patient's current medications.  Assessment/Plan: Slow improvement.  We will try some topical Xylocaine viscus and continue pushing oral liquids and soft foods.  Anticipate discharge soon.  LOS: 6 days   Izora Gala 02/25/2021, 8:57 AM  Continues to experience severe pain.  He has been eating and drinking a little bit better since yesterday.  He is having bowel movements.

## 2021-02-26 LAB — GLUCOSE, CAPILLARY
Glucose-Capillary: 262 mg/dL — ABNORMAL HIGH (ref 70–99)
Glucose-Capillary: 184 mg/dL — ABNORMAL HIGH (ref 70–99)
Glucose-Capillary: 179 mg/dL — ABNORMAL HIGH (ref 70–99)
Glucose-Capillary: 332 mg/dL — ABNORMAL HIGH (ref 70–99)
Glucose-Capillary: 194 mg/dL — ABNORMAL HIGH (ref 70–99)

## 2021-02-26 NOTE — Progress Notes (Signed)
Physical Therapy Treatment Patient Details Name: Francis Sims. MRN: [id number] DOB: Jan 10, 1941 Today's Date: 02/26/2021   History of Present Illness 80 yo admitted 9/6 after inability to eat or drink with Cortrak placed. 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 PMhx: CVA, PVD, HLD, HTN, neuropathy    PT Comments    Patient received in bed, pleasant and cooperative but fatigued- reports that he's not been sleeping well recently. Tolerated progression of gait training, continues to require repeated cues for safe proximity from RW especially when distracted- continues to push it too far ahead when moving and has trouble staying centered in it when turning. Left up in recliner with all needs met, chair alarm active. Will continue efforts.     Recommendations for follow up therapy are one component of a multi-disciplinary discharge planning process, led by the attending physician.  Recommendations may be updated based on patient status, additional functional criteria and insurance authorization.  Follow Up Recommendations  Home health PT;Supervision/Assistance - 24 hour     Equipment Recommendations  Rolling walker with 5" wheels    Recommendations for Other Services       Precautions / Restrictions Precautions Precautions: Fall Precaution Comments: cortrak Restrictions Weight Bearing Restrictions: No     Mobility  Bed Mobility Overal bed mobility: Needs Assistance Bed Mobility: Supine to Sit     Supine to sit: HOB elevated;Min guard     General bed mobility comments: increased time and effort to scoot to EOB, HOB 30 degrees    Transfers Overall transfer level: Needs assistance Equipment used: Rolling walker (2 wheeled) Transfers: Sit to/from Stand Sit to Stand: Min guard;From elevated surface         General transfer comment: min guard to boost to standing from elevated surface, poor recall of hand placement and sequencing and needed Mod  cues to correct  Ambulation/Gait Ambulation/Gait assistance: Min guard Gait Distance (Feet): 300 Feet Assistive device: Rolling walker (2 wheeled) Gait Pattern/deviations: Step-through pattern;Decreased stride length;Shuffle;Decreased dorsiflexion - right;Decreased dorsiflexion - left Gait velocity: decreased   General Gait Details: tolerated gait well today, less L foot/leg dragging noted but did need cues to maintain safe distance from RW especially when turning   Stairs             Wheelchair Mobility    Modified Rankin (Stroke Patients Only)       Balance Overall balance assessment: Needs assistance Sitting-balance support: No upper extremity supported;Feet unsupported Sitting balance-Leahy Scale: Good     Standing balance support: Bilateral upper extremity supported;During functional activity Standing balance-Leahy Scale: Poor Standing balance comment: Reliant on RW during transfers and gait                            Cognition Arousal/Alertness: Awake/alert Behavior During Therapy: Flat affect Overall Cognitive Status: Within Functional Limits for tasks assessed                                 General Comments: able to follow cues well, a bit difficult to understand given mandibular hx      Exercises      General Comments        Pertinent Vitals/Pain Pain Assessment: Faces Faces Pain Scale: No hurt Pain Intervention(s): Limited activity within patient's tolerance;Monitored during session    Home Living  Prior Function            PT Goals (current goals can now be found in the care plan section) Acute Rehab PT Goals Patient Stated Goal: Return to hiking and going to gym PT Goal Formulation: With patient Time For Goal Achievement: 03/06/21 Potential to Achieve Goals: Good Progress towards PT goals: Progressing toward goals    Frequency    Min 3X/week      PT Plan Current plan  remains appropriate    Co-evaluation              AM-PAC PT "6 Clicks" Mobility   Outcome Measure  Help needed turning from your back to your side while in a flat bed without using bedrails?: A Little Help needed moving from lying on your back to sitting on the side of a flat bed without using bedrails?: A Little Help needed moving to and from a bed to a chair (including a wheelchair)?: A Little Help needed standing up from a chair using your arms (e.g., wheelchair or bedside chair)?: A Little Help needed to walk in hospital room?: A Little Help needed climbing 3-5 steps with a railing? : A Little 6 Click Score: 18    End of Session Equipment Utilized During Treatment: Gait belt Activity Tolerance: Patient tolerated treatment well Patient left: in chair;with call bell/phone within reach;with chair alarm set Nurse Communication: Mobility status PT Visit Diagnosis: Other abnormalities of gait and mobility (R26.89);Unsteadiness on feet (R26.81);Muscle weakness (generalized) (M62.81)     Time: 9842-1031 PT Time Calculation (min) (ACUTE ONLY): 21 min  Charges:  $Gait Training: 8-22 mins                    Windell Norfolk, DPT, PN2   Supplemental Physical Therapist [place]    Pager [phone] Acute Rehab Office [phone]

## 2021-02-26 NOTE — Progress Notes (Signed)
Subjective: His pain level continues to improve.  He is not able to tolerate the lidocaine viscous.  The To eat and drink more but is not getting it quickly and.  Objective: Vital signs in last 24 hours: Temp:  [97.6 F (36.4 C)-98.4 F (36.9 C)] 97.8 F (36.6 C) (09/14 0830) Pulse Rate:  [76-85] 78 (09/14 0830) Resp:  [14-20] 20 (09/14 0830) BP: (152-185)/(76-90) 185/90 (09/14 0830) SpO2:  [95 %-97 %] 95 % (09/14 0830) Weight change:  Last BM Date: 02/25/21  Intake/Output from previous day: 09/13 0701 - 09/14 0700 In: 75 [P.O.:75] Out: 1200 [Urine:1025; Emesis/NG output:175] Intake/Output this shift: No intake/output data recorded.  PHYSICAL EXAM: No change on exam.  Breathing and voice are clear.  No external swelling.  Lab Results: No results for input(s): WBC, HGB, HCT, PLT in the last 72 hours. BMET No results for input(s): NA, K, CL, CO2, GLUCOSE, BUN, CREATININE, CALCIUM in the last 72 hours.  Studies/Results: No results found.  Medications: I have reviewed the patient's current medications.  Assessment/Plan: Continues to improve.  Anticipate possible removal of feeding tube and discharge tomorrow.  LOS: 7 days   Izora Gala 02/26/2021, 11:01 AM

## 2021-02-27 DIAGNOSIS — R531 Weakness: Secondary | ICD-10-CM | POA: Diagnosis not present

## 2021-02-27 LAB — GLUCOSE, CAPILLARY
Glucose-Capillary: 172 mg/dL — ABNORMAL HIGH (ref 70–99)
Glucose-Capillary: 207 mg/dL — ABNORMAL HIGH (ref 70–99)
Glucose-Capillary: 211 mg/dL — ABNORMAL HIGH (ref 70–99)
Glucose-Capillary: 257 mg/dL — ABNORMAL HIGH (ref 70–99)

## 2021-02-27 NOTE — Progress Notes (Signed)
Francis Sims. to be D/C'd  per MD order.  Discussed with the patient and all questions fully answered.  VSS, Skin clean, dry and intact without evidence of skin break down, no evidence of skin tears noted.  IV catheter discontinued intact. Site without signs and symptoms of complications. Dressing and pressure applied.  An After Visit Summary was printed and given to the patient. Patient received prescription.  D/c education completed with patient/family including follow up instructions, medication list, d/c activities limitations if indicated, with other d/c instructions as indicated by MD - patient able to verbalize understanding, all questions fully answered.   Patient instructed to return to ED, call 911, or call MD for any changes in condition.   Patient to be escorted via [place], and D/C home via private auto.

## 2021-02-27 NOTE — Progress Notes (Signed)
Pt attempted scrambled eggs, but they kept getting caught in the surgical spot in his mouth.  Switched back to pudding and jello.

## 2021-02-27 NOTE — Care Management (Signed)
Ordered 3 in 1 with Freda Munro with [employer]

## 2021-02-27 NOTE — Progress Notes (Signed)
Continues to improve.  Eating and drinking better.  Will discharge home today.

## 2021-02-27 NOTE — Progress Notes (Signed)
Inpatient Diabetes Program Recommendations  AACE/ADA: New Consensus Statement on Inpatient Glycemic Control (2015)  Target Ranges:  Prepandial:   less than 140 mg/dL      Peak postprandial:   less than 180 mg/dL (1-2 hours)      Critically ill patients:  140 - 180 mg/dL  Results for [facility], Layken M JR. "ED" (MRN [id number]) as of 02/27/2021 09:48  Ref. Range 02/25/2021 23:54 02/26/2021 04:48 02/26/2021 08:27 02/26/2021 12:36 02/26/2021 17:01 02/26/2021 20:07  Glucose-Capillary Latest Ref Range: 70 - 99 mg/dL 190 (H)  3 units Novolog  262 (H)  8 units Novolog  184 (H)  3 units Novolog  179 (H)  3 units Novolog  332 (H)  11 units Novolog  194 (H)  3 units Novolog   Results for [place], Teddy M JR. "ED" (MRN [id number]) as of 02/27/2021 09:48  Ref. Range 02/27/2021 00:12 02/27/2021 04:21 02/27/2021 09:18  Glucose-Capillary Latest Ref Range: 70 - 99 mg/dL 207 (H)  5 units Novolog  257 (H)  8 units Novolog  211 (H)    Admit with: Inability to eat and drink s/p mandibular alveolar cancer resection on 09/02  History: DM2  Home DM Meds: Metformin 500 mg Daily  Current Orders: Novolog Moderate Correction Scale/ SSI (0-15 units) Q4 hours    MD- Note patient getting Osmolite tube feeds at 75cc/hr  If pt to remain on tube feeds, please consider starting Novolog tube feed coverage (give with Novolog SSI dose)  Novolog 4 units Q4 hours  HOLD if tube feed HELD for any reason     --Will follow patient during hospitalization--  Wyn Quaker RN, MSN, CDE Diabetes Coordinator Inpatient Glycemic Control Team Team Pager: [phone] (8a-5p)

## 2021-02-27 NOTE — Discharge Summary (Signed)
Physician Discharge Summary  Patient ID: Francis Sims. MRN: [id number] DOB/AGE: 78-15-1942 78 y.o.  Admit date: 02/18/2021 Discharge date: 02/27/2021  Admission Diagnoses: Oral cancer, poor oral intake  Discharge Diagnoses:  Active Problems:   Inadequate oral intake   Malnutrition of moderate degree   Discharged Condition: good  Hospital Course: No complications.  Nasogastric feeding tube placed.  Nutritional status improved.  Pain controlled with medication.  Consults: none  Significant Diagnostic Studies: none  Treatments: IV hydration  Discharge Exam: Blood pressure (!) 143/95, pulse 89, temperature 97.9 F (36.6 C), temperature source Oral, resp. rate 18, height '6\' 2"'$  (1.88 m), weight 83.2 kg, SpO2 96 %. PHYSICAL EXAM: Awake and alert.  Breathing and talking clearly.  Surgical site is healing nicely.  No external swelling.  Disposition:      Follow-up Information     Izora Gala, MD. Schedule an appointment as soon as possible for a visit in 1 week(s).   Specialty: Otolaryngology Contact information: [address] [place] [address] [phone]         [facility] [place] Follow up.   Specialty: Home Health Services Contact information: [place] [address] [phone]         Izora Gala, MD. Schedule an appointment as soon as possible for a visit in 3 week(s).   Specialty: Otolaryngology Contact information: [address] [place] [address] [phone]                 Signed: Izora Gala 02/27/2021, 11:58 AM

## 2021-03-05 ENCOUNTER — Other Ambulatory Visit: Payer: Self-pay | Admitting: Internal Medicine

## 2021-03-05 DIAGNOSIS — R634 Abnormal weight loss: Secondary | ICD-10-CM | POA: Diagnosis not present

## 2021-03-05 DIAGNOSIS — F4323 Adjustment disorder with mixed anxiety and depressed mood: Secondary | ICD-10-CM | POA: Diagnosis not present

## 2021-03-05 DIAGNOSIS — H6980 Other specified disorders of Eustachian tube, unspecified ear: Secondary | ICD-10-CM | POA: Diagnosis not present

## 2021-03-05 DIAGNOSIS — K760 Fatty (change of) liver, not elsewhere classified: Secondary | ICD-10-CM | POA: Diagnosis not present

## 2021-03-05 DIAGNOSIS — I69354 Hemiplegia and hemiparesis following cerebral infarction affecting left non-dominant side: Secondary | ICD-10-CM | POA: Diagnosis not present

## 2021-03-05 DIAGNOSIS — I10 Essential (primary) hypertension: Secondary | ICD-10-CM | POA: Diagnosis not present

## 2021-03-05 DIAGNOSIS — Z7982 Long term (current) use of aspirin: Secondary | ICD-10-CM | POA: Diagnosis not present

## 2021-03-05 DIAGNOSIS — H43819 Vitreous degeneration, unspecified eye: Secondary | ICD-10-CM | POA: Diagnosis not present

## 2021-03-05 DIAGNOSIS — J310 Chronic rhinitis: Secondary | ICD-10-CM | POA: Diagnosis not present

## 2021-03-05 DIAGNOSIS — M19079 Primary osteoarthritis, unspecified ankle and foot: Secondary | ICD-10-CM | POA: Diagnosis not present

## 2021-03-05 DIAGNOSIS — E1140 Type 2 diabetes mellitus with diabetic neuropathy, unspecified: Secondary | ICD-10-CM | POA: Diagnosis not present

## 2021-03-05 DIAGNOSIS — G4714 Hypersomnia due to medical condition: Secondary | ICD-10-CM | POA: Diagnosis not present

## 2021-03-05 DIAGNOSIS — Z7902 Long term (current) use of antithrombotics/antiplatelets: Secondary | ICD-10-CM | POA: Diagnosis not present

## 2021-03-05 DIAGNOSIS — C031 Malignant neoplasm of lower gum: Secondary | ICD-10-CM | POA: Diagnosis not present

## 2021-03-05 DIAGNOSIS — N529 Male erectile dysfunction, unspecified: Secondary | ICD-10-CM | POA: Diagnosis not present

## 2021-03-05 DIAGNOSIS — Z483 Aftercare following surgery for neoplasm: Secondary | ICD-10-CM | POA: Diagnosis not present

## 2021-03-05 DIAGNOSIS — R202 Paresthesia of skin: Secondary | ICD-10-CM | POA: Diagnosis not present

## 2021-03-05 DIAGNOSIS — M5416 Radiculopathy, lumbar region: Secondary | ICD-10-CM | POA: Diagnosis not present

## 2021-03-05 DIAGNOSIS — N320 Bladder-neck obstruction: Secondary | ICD-10-CM | POA: Diagnosis not present

## 2021-03-05 DIAGNOSIS — E785 Hyperlipidemia, unspecified: Secondary | ICD-10-CM | POA: Diagnosis not present

## 2021-03-05 DIAGNOSIS — I2510 Atherosclerotic heart disease of native coronary artery without angina pectoris: Secondary | ICD-10-CM | POA: Diagnosis not present

## 2021-03-05 DIAGNOSIS — E1151 Type 2 diabetes mellitus with diabetic peripheral angiopathy without gangrene: Secondary | ICD-10-CM | POA: Diagnosis not present

## 2021-03-05 DIAGNOSIS — C0680 Malignant neoplasm of overlapping sites of unspecified parts of mouth: Secondary | ICD-10-CM | POA: Diagnosis not present

## 2021-03-05 DIAGNOSIS — M722 Plantar fascial fibromatosis: Secondary | ICD-10-CM | POA: Diagnosis not present

## 2021-03-05 DIAGNOSIS — R1310 Dysphagia, unspecified: Secondary | ICD-10-CM | POA: Diagnosis not present

## 2021-03-06 ENCOUNTER — Telehealth: Payer: Self-pay

## 2021-03-06 NOTE — Telephone Encounter (Signed)
Okay.  Thanks.

## 2021-03-06 NOTE — Telephone Encounter (Signed)
Called Francis Sims gave verbal. Pt is in good standing w/ appts w/ MD../lmb

## 2021-03-06 NOTE — Telephone Encounter (Signed)
Verbal order request for the frequency 1 time a week for 8 weeks  Kelly with [facility] [phone]

## 2021-03-10 MED ORDER — PANTOPRAZOLE SODIUM 40 MG PO TBEC: 40.0000 mg | DELAYED_RELEASE_TABLET | Freq: Every day | ORAL | 3 refills | Status: DC

## 2021-03-10 NOTE — Addendum Note (Signed)
Addended by: Earnstine Regal on: 03/10/2021 09:00 AM   Modules accepted: Orders

## 2021-03-11 DIAGNOSIS — N320 Bladder-neck obstruction: Secondary | ICD-10-CM | POA: Diagnosis not present

## 2021-03-11 DIAGNOSIS — F4323 Adjustment disorder with mixed anxiety and depressed mood: Secondary | ICD-10-CM | POA: Diagnosis not present

## 2021-03-11 DIAGNOSIS — H6980 Other specified disorders of Eustachian tube, unspecified ear: Secondary | ICD-10-CM | POA: Diagnosis not present

## 2021-03-11 DIAGNOSIS — E1140 Type 2 diabetes mellitus with diabetic neuropathy, unspecified: Secondary | ICD-10-CM | POA: Diagnosis not present

## 2021-03-11 DIAGNOSIS — I69354 Hemiplegia and hemiparesis following cerebral infarction affecting left non-dominant side: Secondary | ICD-10-CM | POA: Diagnosis not present

## 2021-03-11 DIAGNOSIS — G4714 Hypersomnia due to medical condition: Secondary | ICD-10-CM | POA: Diagnosis not present

## 2021-03-11 DIAGNOSIS — E1151 Type 2 diabetes mellitus with diabetic peripheral angiopathy without gangrene: Secondary | ICD-10-CM | POA: Diagnosis not present

## 2021-03-11 DIAGNOSIS — Z483 Aftercare following surgery for neoplasm: Secondary | ICD-10-CM | POA: Diagnosis not present

## 2021-03-11 DIAGNOSIS — I10 Essential (primary) hypertension: Secondary | ICD-10-CM | POA: Diagnosis not present

## 2021-03-11 DIAGNOSIS — I2510 Atherosclerotic heart disease of native coronary artery without angina pectoris: Secondary | ICD-10-CM | POA: Diagnosis not present

## 2021-03-11 DIAGNOSIS — C031 Malignant neoplasm of lower gum: Secondary | ICD-10-CM | POA: Diagnosis not present

## 2021-03-11 DIAGNOSIS — M722 Plantar fascial fibromatosis: Secondary | ICD-10-CM | POA: Diagnosis not present

## 2021-03-11 DIAGNOSIS — N529 Male erectile dysfunction, unspecified: Secondary | ICD-10-CM | POA: Diagnosis not present

## 2021-03-11 DIAGNOSIS — Z7902 Long term (current) use of antithrombotics/antiplatelets: Secondary | ICD-10-CM | POA: Diagnosis not present

## 2021-03-11 DIAGNOSIS — Z7982 Long term (current) use of aspirin: Secondary | ICD-10-CM | POA: Diagnosis not present

## 2021-03-11 DIAGNOSIS — C0680 Malignant neoplasm of overlapping sites of unspecified parts of mouth: Secondary | ICD-10-CM | POA: Diagnosis not present

## 2021-03-11 DIAGNOSIS — M5416 Radiculopathy, lumbar region: Secondary | ICD-10-CM | POA: Diagnosis not present

## 2021-03-11 DIAGNOSIS — M19079 Primary osteoarthritis, unspecified ankle and foot: Secondary | ICD-10-CM | POA: Diagnosis not present

## 2021-03-11 DIAGNOSIS — K760 Fatty (change of) liver, not elsewhere classified: Secondary | ICD-10-CM | POA: Diagnosis not present

## 2021-03-11 DIAGNOSIS — R202 Paresthesia of skin: Secondary | ICD-10-CM | POA: Diagnosis not present

## 2021-03-11 DIAGNOSIS — R634 Abnormal weight loss: Secondary | ICD-10-CM | POA: Diagnosis not present

## 2021-03-11 DIAGNOSIS — E785 Hyperlipidemia, unspecified: Secondary | ICD-10-CM | POA: Diagnosis not present

## 2021-03-11 DIAGNOSIS — J310 Chronic rhinitis: Secondary | ICD-10-CM | POA: Diagnosis not present

## 2021-03-11 DIAGNOSIS — H43819 Vitreous degeneration, unspecified eye: Secondary | ICD-10-CM | POA: Diagnosis not present

## 2021-03-13 DIAGNOSIS — C0680 Malignant neoplasm of overlapping sites of unspecified parts of mouth: Secondary | ICD-10-CM | POA: Diagnosis not present

## 2021-03-13 DIAGNOSIS — G4714 Hypersomnia due to medical condition: Secondary | ICD-10-CM | POA: Diagnosis not present

## 2021-03-13 DIAGNOSIS — F4323 Adjustment disorder with mixed anxiety and depressed mood: Secondary | ICD-10-CM | POA: Diagnosis not present

## 2021-03-13 DIAGNOSIS — I69354 Hemiplegia and hemiparesis following cerebral infarction affecting left non-dominant side: Secondary | ICD-10-CM | POA: Diagnosis not present

## 2021-03-13 DIAGNOSIS — Z7982 Long term (current) use of aspirin: Secondary | ICD-10-CM | POA: Diagnosis not present

## 2021-03-13 DIAGNOSIS — N320 Bladder-neck obstruction: Secondary | ICD-10-CM | POA: Diagnosis not present

## 2021-03-13 DIAGNOSIS — Z7902 Long term (current) use of antithrombotics/antiplatelets: Secondary | ICD-10-CM | POA: Diagnosis not present

## 2021-03-13 DIAGNOSIS — E785 Hyperlipidemia, unspecified: Secondary | ICD-10-CM | POA: Diagnosis not present

## 2021-03-13 DIAGNOSIS — E1151 Type 2 diabetes mellitus with diabetic peripheral angiopathy without gangrene: Secondary | ICD-10-CM | POA: Diagnosis not present

## 2021-03-13 DIAGNOSIS — I2510 Atherosclerotic heart disease of native coronary artery without angina pectoris: Secondary | ICD-10-CM | POA: Diagnosis not present

## 2021-03-13 DIAGNOSIS — C031 Malignant neoplasm of lower gum: Secondary | ICD-10-CM | POA: Diagnosis not present

## 2021-03-13 DIAGNOSIS — I10 Essential (primary) hypertension: Secondary | ICD-10-CM | POA: Diagnosis not present

## 2021-03-13 DIAGNOSIS — J310 Chronic rhinitis: Secondary | ICD-10-CM | POA: Diagnosis not present

## 2021-03-13 DIAGNOSIS — M5416 Radiculopathy, lumbar region: Secondary | ICD-10-CM | POA: Diagnosis not present

## 2021-03-13 DIAGNOSIS — R202 Paresthesia of skin: Secondary | ICD-10-CM | POA: Diagnosis not present

## 2021-03-13 DIAGNOSIS — M722 Plantar fascial fibromatosis: Secondary | ICD-10-CM | POA: Diagnosis not present

## 2021-03-13 DIAGNOSIS — E1140 Type 2 diabetes mellitus with diabetic neuropathy, unspecified: Secondary | ICD-10-CM | POA: Diagnosis not present

## 2021-03-13 DIAGNOSIS — N529 Male erectile dysfunction, unspecified: Secondary | ICD-10-CM | POA: Diagnosis not present

## 2021-03-13 DIAGNOSIS — Z483 Aftercare following surgery for neoplasm: Secondary | ICD-10-CM | POA: Diagnosis not present

## 2021-03-13 DIAGNOSIS — H6980 Other specified disorders of Eustachian tube, unspecified ear: Secondary | ICD-10-CM | POA: Diagnosis not present

## 2021-03-13 DIAGNOSIS — K760 Fatty (change of) liver, not elsewhere classified: Secondary | ICD-10-CM | POA: Diagnosis not present

## 2021-03-13 DIAGNOSIS — M19079 Primary osteoarthritis, unspecified ankle and foot: Secondary | ICD-10-CM | POA: Diagnosis not present

## 2021-03-13 DIAGNOSIS — H43819 Vitreous degeneration, unspecified eye: Secondary | ICD-10-CM | POA: Diagnosis not present

## 2021-03-13 DIAGNOSIS — R634 Abnormal weight loss: Secondary | ICD-10-CM | POA: Diagnosis not present

## 2021-03-18 DIAGNOSIS — Z7902 Long term (current) use of antithrombotics/antiplatelets: Secondary | ICD-10-CM | POA: Diagnosis not present

## 2021-03-18 DIAGNOSIS — E1151 Type 2 diabetes mellitus with diabetic peripheral angiopathy without gangrene: Secondary | ICD-10-CM | POA: Diagnosis not present

## 2021-03-18 DIAGNOSIS — M19079 Primary osteoarthritis, unspecified ankle and foot: Secondary | ICD-10-CM | POA: Diagnosis not present

## 2021-03-18 DIAGNOSIS — I2510 Atherosclerotic heart disease of native coronary artery without angina pectoris: Secondary | ICD-10-CM | POA: Diagnosis not present

## 2021-03-18 DIAGNOSIS — E785 Hyperlipidemia, unspecified: Secondary | ICD-10-CM | POA: Diagnosis not present

## 2021-03-18 DIAGNOSIS — N320 Bladder-neck obstruction: Secondary | ICD-10-CM | POA: Diagnosis not present

## 2021-03-18 DIAGNOSIS — N529 Male erectile dysfunction, unspecified: Secondary | ICD-10-CM | POA: Diagnosis not present

## 2021-03-18 DIAGNOSIS — Z7982 Long term (current) use of aspirin: Secondary | ICD-10-CM | POA: Diagnosis not present

## 2021-03-18 DIAGNOSIS — J310 Chronic rhinitis: Secondary | ICD-10-CM | POA: Diagnosis not present

## 2021-03-18 DIAGNOSIS — R634 Abnormal weight loss: Secondary | ICD-10-CM | POA: Diagnosis not present

## 2021-03-18 DIAGNOSIS — M722 Plantar fascial fibromatosis: Secondary | ICD-10-CM | POA: Diagnosis not present

## 2021-03-18 DIAGNOSIS — E1140 Type 2 diabetes mellitus with diabetic neuropathy, unspecified: Secondary | ICD-10-CM | POA: Diagnosis not present

## 2021-03-18 DIAGNOSIS — R202 Paresthesia of skin: Secondary | ICD-10-CM | POA: Diagnosis not present

## 2021-03-18 DIAGNOSIS — C031 Malignant neoplasm of lower gum: Secondary | ICD-10-CM | POA: Diagnosis not present

## 2021-03-18 DIAGNOSIS — M5416 Radiculopathy, lumbar region: Secondary | ICD-10-CM | POA: Diagnosis not present

## 2021-03-18 DIAGNOSIS — Z483 Aftercare following surgery for neoplasm: Secondary | ICD-10-CM | POA: Diagnosis not present

## 2021-03-18 DIAGNOSIS — F4323 Adjustment disorder with mixed anxiety and depressed mood: Secondary | ICD-10-CM | POA: Diagnosis not present

## 2021-03-18 DIAGNOSIS — I10 Essential (primary) hypertension: Secondary | ICD-10-CM | POA: Diagnosis not present

## 2021-03-18 DIAGNOSIS — I69354 Hemiplegia and hemiparesis following cerebral infarction affecting left non-dominant side: Secondary | ICD-10-CM | POA: Diagnosis not present

## 2021-03-18 DIAGNOSIS — C0680 Malignant neoplasm of overlapping sites of unspecified parts of mouth: Secondary | ICD-10-CM | POA: Diagnosis not present

## 2021-03-18 DIAGNOSIS — G4714 Hypersomnia due to medical condition: Secondary | ICD-10-CM | POA: Diagnosis not present

## 2021-03-18 DIAGNOSIS — H43819 Vitreous degeneration, unspecified eye: Secondary | ICD-10-CM | POA: Diagnosis not present

## 2021-03-18 DIAGNOSIS — K760 Fatty (change of) liver, not elsewhere classified: Secondary | ICD-10-CM | POA: Diagnosis not present

## 2021-03-18 DIAGNOSIS — H6980 Other specified disorders of Eustachian tube, unspecified ear: Secondary | ICD-10-CM | POA: Diagnosis not present

## 2021-03-19 DIAGNOSIS — I10 Essential (primary) hypertension: Secondary | ICD-10-CM | POA: Diagnosis not present

## 2021-03-19 DIAGNOSIS — Z7982 Long term (current) use of aspirin: Secondary | ICD-10-CM | POA: Diagnosis not present

## 2021-03-19 DIAGNOSIS — E1140 Type 2 diabetes mellitus with diabetic neuropathy, unspecified: Secondary | ICD-10-CM | POA: Diagnosis not present

## 2021-03-19 DIAGNOSIS — I69354 Hemiplegia and hemiparesis following cerebral infarction affecting left non-dominant side: Secondary | ICD-10-CM | POA: Diagnosis not present

## 2021-03-19 DIAGNOSIS — Z7902 Long term (current) use of antithrombotics/antiplatelets: Secondary | ICD-10-CM | POA: Diagnosis not present

## 2021-03-19 DIAGNOSIS — M722 Plantar fascial fibromatosis: Secondary | ICD-10-CM | POA: Diagnosis not present

## 2021-03-19 DIAGNOSIS — K760 Fatty (change of) liver, not elsewhere classified: Secondary | ICD-10-CM | POA: Diagnosis not present

## 2021-03-19 DIAGNOSIS — E1151 Type 2 diabetes mellitus with diabetic peripheral angiopathy without gangrene: Secondary | ICD-10-CM | POA: Diagnosis not present

## 2021-03-19 DIAGNOSIS — E785 Hyperlipidemia, unspecified: Secondary | ICD-10-CM | POA: Diagnosis not present

## 2021-03-19 DIAGNOSIS — C031 Malignant neoplasm of lower gum: Secondary | ICD-10-CM | POA: Diagnosis not present

## 2021-03-19 DIAGNOSIS — H6980 Other specified disorders of Eustachian tube, unspecified ear: Secondary | ICD-10-CM | POA: Diagnosis not present

## 2021-03-19 DIAGNOSIS — F4323 Adjustment disorder with mixed anxiety and depressed mood: Secondary | ICD-10-CM | POA: Diagnosis not present

## 2021-03-19 DIAGNOSIS — N320 Bladder-neck obstruction: Secondary | ICD-10-CM | POA: Diagnosis not present

## 2021-03-19 DIAGNOSIS — H43819 Vitreous degeneration, unspecified eye: Secondary | ICD-10-CM | POA: Diagnosis not present

## 2021-03-19 DIAGNOSIS — R202 Paresthesia of skin: Secondary | ICD-10-CM | POA: Diagnosis not present

## 2021-03-19 DIAGNOSIS — I2510 Atherosclerotic heart disease of native coronary artery without angina pectoris: Secondary | ICD-10-CM | POA: Diagnosis not present

## 2021-03-19 DIAGNOSIS — M19079 Primary osteoarthritis, unspecified ankle and foot: Secondary | ICD-10-CM | POA: Diagnosis not present

## 2021-03-19 DIAGNOSIS — G4714 Hypersomnia due to medical condition: Secondary | ICD-10-CM | POA: Diagnosis not present

## 2021-03-19 DIAGNOSIS — M5416 Radiculopathy, lumbar region: Secondary | ICD-10-CM | POA: Diagnosis not present

## 2021-03-19 DIAGNOSIS — Z483 Aftercare following surgery for neoplasm: Secondary | ICD-10-CM | POA: Diagnosis not present

## 2021-03-19 DIAGNOSIS — R634 Abnormal weight loss: Secondary | ICD-10-CM | POA: Diagnosis not present

## 2021-03-19 DIAGNOSIS — C0680 Malignant neoplasm of overlapping sites of unspecified parts of mouth: Secondary | ICD-10-CM | POA: Diagnosis not present

## 2021-03-19 DIAGNOSIS — J310 Chronic rhinitis: Secondary | ICD-10-CM | POA: Diagnosis not present

## 2021-03-19 DIAGNOSIS — N529 Male erectile dysfunction, unspecified: Secondary | ICD-10-CM | POA: Diagnosis not present

## 2021-03-25 DIAGNOSIS — Z7982 Long term (current) use of aspirin: Secondary | ICD-10-CM | POA: Diagnosis not present

## 2021-03-25 DIAGNOSIS — F4323 Adjustment disorder with mixed anxiety and depressed mood: Secondary | ICD-10-CM | POA: Diagnosis not present

## 2021-03-25 DIAGNOSIS — M722 Plantar fascial fibromatosis: Secondary | ICD-10-CM | POA: Diagnosis not present

## 2021-03-25 DIAGNOSIS — I2510 Atherosclerotic heart disease of native coronary artery without angina pectoris: Secondary | ICD-10-CM | POA: Diagnosis not present

## 2021-03-25 DIAGNOSIS — Z7902 Long term (current) use of antithrombotics/antiplatelets: Secondary | ICD-10-CM | POA: Diagnosis not present

## 2021-03-25 DIAGNOSIS — C0680 Malignant neoplasm of overlapping sites of unspecified parts of mouth: Secondary | ICD-10-CM | POA: Diagnosis not present

## 2021-03-25 DIAGNOSIS — E785 Hyperlipidemia, unspecified: Secondary | ICD-10-CM | POA: Diagnosis not present

## 2021-03-25 DIAGNOSIS — K760 Fatty (change of) liver, not elsewhere classified: Secondary | ICD-10-CM | POA: Diagnosis not present

## 2021-03-25 DIAGNOSIS — E1151 Type 2 diabetes mellitus with diabetic peripheral angiopathy without gangrene: Secondary | ICD-10-CM | POA: Diagnosis not present

## 2021-03-25 DIAGNOSIS — H6980 Other specified disorders of Eustachian tube, unspecified ear: Secondary | ICD-10-CM | POA: Diagnosis not present

## 2021-03-25 DIAGNOSIS — J310 Chronic rhinitis: Secondary | ICD-10-CM | POA: Diagnosis not present

## 2021-03-25 DIAGNOSIS — G4714 Hypersomnia due to medical condition: Secondary | ICD-10-CM | POA: Diagnosis not present

## 2021-03-25 DIAGNOSIS — E1140 Type 2 diabetes mellitus with diabetic neuropathy, unspecified: Secondary | ICD-10-CM | POA: Diagnosis not present

## 2021-03-25 DIAGNOSIS — R634 Abnormal weight loss: Secondary | ICD-10-CM | POA: Diagnosis not present

## 2021-03-25 DIAGNOSIS — M5416 Radiculopathy, lumbar region: Secondary | ICD-10-CM | POA: Diagnosis not present

## 2021-03-25 DIAGNOSIS — H43819 Vitreous degeneration, unspecified eye: Secondary | ICD-10-CM | POA: Diagnosis not present

## 2021-03-25 DIAGNOSIS — M19079 Primary osteoarthritis, unspecified ankle and foot: Secondary | ICD-10-CM | POA: Diagnosis not present

## 2021-03-25 DIAGNOSIS — I10 Essential (primary) hypertension: Secondary | ICD-10-CM | POA: Diagnosis not present

## 2021-03-25 DIAGNOSIS — Z483 Aftercare following surgery for neoplasm: Secondary | ICD-10-CM | POA: Diagnosis not present

## 2021-03-25 DIAGNOSIS — I69354 Hemiplegia and hemiparesis following cerebral infarction affecting left non-dominant side: Secondary | ICD-10-CM | POA: Diagnosis not present

## 2021-03-25 DIAGNOSIS — C031 Malignant neoplasm of lower gum: Secondary | ICD-10-CM | POA: Diagnosis not present

## 2021-03-25 DIAGNOSIS — R202 Paresthesia of skin: Secondary | ICD-10-CM | POA: Diagnosis not present

## 2021-03-25 DIAGNOSIS — N320 Bladder-neck obstruction: Secondary | ICD-10-CM | POA: Diagnosis not present

## 2021-03-25 DIAGNOSIS — N529 Male erectile dysfunction, unspecified: Secondary | ICD-10-CM | POA: Diagnosis not present

## 2021-03-27 DIAGNOSIS — R202 Paresthesia of skin: Secondary | ICD-10-CM | POA: Diagnosis not present

## 2021-03-27 DIAGNOSIS — H6980 Other specified disorders of Eustachian tube, unspecified ear: Secondary | ICD-10-CM | POA: Diagnosis not present

## 2021-03-27 DIAGNOSIS — M19079 Primary osteoarthritis, unspecified ankle and foot: Secondary | ICD-10-CM | POA: Diagnosis not present

## 2021-03-27 DIAGNOSIS — M722 Plantar fascial fibromatosis: Secondary | ICD-10-CM | POA: Diagnosis not present

## 2021-03-27 DIAGNOSIS — G4714 Hypersomnia due to medical condition: Secondary | ICD-10-CM | POA: Diagnosis not present

## 2021-03-27 DIAGNOSIS — Z7982 Long term (current) use of aspirin: Secondary | ICD-10-CM | POA: Diagnosis not present

## 2021-03-27 DIAGNOSIS — C0680 Malignant neoplasm of overlapping sites of unspecified parts of mouth: Secondary | ICD-10-CM | POA: Diagnosis not present

## 2021-03-27 DIAGNOSIS — I2510 Atherosclerotic heart disease of native coronary artery without angina pectoris: Secondary | ICD-10-CM | POA: Diagnosis not present

## 2021-03-27 DIAGNOSIS — I69354 Hemiplegia and hemiparesis following cerebral infarction affecting left non-dominant side: Secondary | ICD-10-CM | POA: Diagnosis not present

## 2021-03-27 DIAGNOSIS — H43819 Vitreous degeneration, unspecified eye: Secondary | ICD-10-CM | POA: Diagnosis not present

## 2021-03-27 DIAGNOSIS — N320 Bladder-neck obstruction: Secondary | ICD-10-CM | POA: Diagnosis not present

## 2021-03-27 DIAGNOSIS — E1140 Type 2 diabetes mellitus with diabetic neuropathy, unspecified: Secondary | ICD-10-CM | POA: Diagnosis not present

## 2021-03-27 DIAGNOSIS — N529 Male erectile dysfunction, unspecified: Secondary | ICD-10-CM | POA: Diagnosis not present

## 2021-03-27 DIAGNOSIS — E785 Hyperlipidemia, unspecified: Secondary | ICD-10-CM | POA: Diagnosis not present

## 2021-03-27 DIAGNOSIS — Z483 Aftercare following surgery for neoplasm: Secondary | ICD-10-CM | POA: Diagnosis not present

## 2021-03-27 DIAGNOSIS — Z7902 Long term (current) use of antithrombotics/antiplatelets: Secondary | ICD-10-CM | POA: Diagnosis not present

## 2021-03-27 DIAGNOSIS — E1151 Type 2 diabetes mellitus with diabetic peripheral angiopathy without gangrene: Secondary | ICD-10-CM | POA: Diagnosis not present

## 2021-03-27 DIAGNOSIS — F4323 Adjustment disorder with mixed anxiety and depressed mood: Secondary | ICD-10-CM | POA: Diagnosis not present

## 2021-03-27 DIAGNOSIS — I10 Essential (primary) hypertension: Secondary | ICD-10-CM | POA: Diagnosis not present

## 2021-03-27 DIAGNOSIS — J310 Chronic rhinitis: Secondary | ICD-10-CM | POA: Diagnosis not present

## 2021-03-27 DIAGNOSIS — M5416 Radiculopathy, lumbar region: Secondary | ICD-10-CM | POA: Diagnosis not present

## 2021-03-27 DIAGNOSIS — R634 Abnormal weight loss: Secondary | ICD-10-CM | POA: Diagnosis not present

## 2021-03-27 DIAGNOSIS — C031 Malignant neoplasm of lower gum: Secondary | ICD-10-CM | POA: Diagnosis not present

## 2021-03-27 DIAGNOSIS — K760 Fatty (change of) liver, not elsewhere classified: Secondary | ICD-10-CM | POA: Diagnosis not present

## 2021-03-31 ENCOUNTER — Other Ambulatory Visit: Payer: Self-pay | Admitting: Internal Medicine

## 2021-04-01 DIAGNOSIS — N320 Bladder-neck obstruction: Secondary | ICD-10-CM | POA: Diagnosis not present

## 2021-04-01 DIAGNOSIS — M19079 Primary osteoarthritis, unspecified ankle and foot: Secondary | ICD-10-CM | POA: Diagnosis not present

## 2021-04-01 DIAGNOSIS — I10 Essential (primary) hypertension: Secondary | ICD-10-CM | POA: Diagnosis not present

## 2021-04-01 DIAGNOSIS — H6980 Other specified disorders of Eustachian tube, unspecified ear: Secondary | ICD-10-CM | POA: Diagnosis not present

## 2021-04-01 DIAGNOSIS — R202 Paresthesia of skin: Secondary | ICD-10-CM | POA: Diagnosis not present

## 2021-04-01 DIAGNOSIS — I69354 Hemiplegia and hemiparesis following cerebral infarction affecting left non-dominant side: Secondary | ICD-10-CM | POA: Diagnosis not present

## 2021-04-01 DIAGNOSIS — N529 Male erectile dysfunction, unspecified: Secondary | ICD-10-CM | POA: Diagnosis not present

## 2021-04-01 DIAGNOSIS — H43819 Vitreous degeneration, unspecified eye: Secondary | ICD-10-CM | POA: Diagnosis not present

## 2021-04-01 DIAGNOSIS — Z7982 Long term (current) use of aspirin: Secondary | ICD-10-CM | POA: Diagnosis not present

## 2021-04-01 DIAGNOSIS — C031 Malignant neoplasm of lower gum: Secondary | ICD-10-CM | POA: Diagnosis not present

## 2021-04-01 DIAGNOSIS — E785 Hyperlipidemia, unspecified: Secondary | ICD-10-CM | POA: Diagnosis not present

## 2021-04-01 DIAGNOSIS — E1140 Type 2 diabetes mellitus with diabetic neuropathy, unspecified: Secondary | ICD-10-CM | POA: Diagnosis not present

## 2021-04-01 DIAGNOSIS — M722 Plantar fascial fibromatosis: Secondary | ICD-10-CM | POA: Diagnosis not present

## 2021-04-01 DIAGNOSIS — E1151 Type 2 diabetes mellitus with diabetic peripheral angiopathy without gangrene: Secondary | ICD-10-CM | POA: Diagnosis not present

## 2021-04-01 DIAGNOSIS — G4714 Hypersomnia due to medical condition: Secondary | ICD-10-CM | POA: Diagnosis not present

## 2021-04-01 DIAGNOSIS — Z7902 Long term (current) use of antithrombotics/antiplatelets: Secondary | ICD-10-CM | POA: Diagnosis not present

## 2021-04-01 DIAGNOSIS — K760 Fatty (change of) liver, not elsewhere classified: Secondary | ICD-10-CM | POA: Diagnosis not present

## 2021-04-01 DIAGNOSIS — C0680 Malignant neoplasm of overlapping sites of unspecified parts of mouth: Secondary | ICD-10-CM | POA: Diagnosis not present

## 2021-04-01 DIAGNOSIS — Z483 Aftercare following surgery for neoplasm: Secondary | ICD-10-CM | POA: Diagnosis not present

## 2021-04-01 DIAGNOSIS — F4323 Adjustment disorder with mixed anxiety and depressed mood: Secondary | ICD-10-CM | POA: Diagnosis not present

## 2021-04-01 DIAGNOSIS — J310 Chronic rhinitis: Secondary | ICD-10-CM | POA: Diagnosis not present

## 2021-04-01 DIAGNOSIS — R634 Abnormal weight loss: Secondary | ICD-10-CM | POA: Diagnosis not present

## 2021-04-01 DIAGNOSIS — M5416 Radiculopathy, lumbar region: Secondary | ICD-10-CM | POA: Diagnosis not present

## 2021-04-01 DIAGNOSIS — I2510 Atherosclerotic heart disease of native coronary artery without angina pectoris: Secondary | ICD-10-CM | POA: Diagnosis not present

## 2021-04-02 DIAGNOSIS — E1140 Type 2 diabetes mellitus with diabetic neuropathy, unspecified: Secondary | ICD-10-CM | POA: Diagnosis not present

## 2021-04-02 DIAGNOSIS — Z7902 Long term (current) use of antithrombotics/antiplatelets: Secondary | ICD-10-CM | POA: Diagnosis not present

## 2021-04-02 DIAGNOSIS — G4714 Hypersomnia due to medical condition: Secondary | ICD-10-CM | POA: Diagnosis not present

## 2021-04-02 DIAGNOSIS — R634 Abnormal weight loss: Secondary | ICD-10-CM | POA: Diagnosis not present

## 2021-04-02 DIAGNOSIS — K760 Fatty (change of) liver, not elsewhere classified: Secondary | ICD-10-CM | POA: Diagnosis not present

## 2021-04-02 DIAGNOSIS — I2510 Atherosclerotic heart disease of native coronary artery without angina pectoris: Secondary | ICD-10-CM | POA: Diagnosis not present

## 2021-04-02 DIAGNOSIS — H6980 Other specified disorders of Eustachian tube, unspecified ear: Secondary | ICD-10-CM | POA: Diagnosis not present

## 2021-04-02 DIAGNOSIS — J310 Chronic rhinitis: Secondary | ICD-10-CM | POA: Diagnosis not present

## 2021-04-02 DIAGNOSIS — E1151 Type 2 diabetes mellitus with diabetic peripheral angiopathy without gangrene: Secondary | ICD-10-CM | POA: Diagnosis not present

## 2021-04-02 DIAGNOSIS — C031 Malignant neoplasm of lower gum: Secondary | ICD-10-CM | POA: Diagnosis not present

## 2021-04-02 DIAGNOSIS — C0680 Malignant neoplasm of overlapping sites of unspecified parts of mouth: Secondary | ICD-10-CM | POA: Diagnosis not present

## 2021-04-02 DIAGNOSIS — F4323 Adjustment disorder with mixed anxiety and depressed mood: Secondary | ICD-10-CM | POA: Diagnosis not present

## 2021-04-02 DIAGNOSIS — Z7982 Long term (current) use of aspirin: Secondary | ICD-10-CM | POA: Diagnosis not present

## 2021-04-02 DIAGNOSIS — M5416 Radiculopathy, lumbar region: Secondary | ICD-10-CM | POA: Diagnosis not present

## 2021-04-02 DIAGNOSIS — M19079 Primary osteoarthritis, unspecified ankle and foot: Secondary | ICD-10-CM | POA: Diagnosis not present

## 2021-04-02 DIAGNOSIS — E785 Hyperlipidemia, unspecified: Secondary | ICD-10-CM | POA: Diagnosis not present

## 2021-04-02 DIAGNOSIS — R202 Paresthesia of skin: Secondary | ICD-10-CM | POA: Diagnosis not present

## 2021-04-02 DIAGNOSIS — I69354 Hemiplegia and hemiparesis following cerebral infarction affecting left non-dominant side: Secondary | ICD-10-CM | POA: Diagnosis not present

## 2021-04-02 DIAGNOSIS — M722 Plantar fascial fibromatosis: Secondary | ICD-10-CM | POA: Diagnosis not present

## 2021-04-02 DIAGNOSIS — H43819 Vitreous degeneration, unspecified eye: Secondary | ICD-10-CM | POA: Diagnosis not present

## 2021-04-02 DIAGNOSIS — Z483 Aftercare following surgery for neoplasm: Secondary | ICD-10-CM | POA: Diagnosis not present

## 2021-04-02 DIAGNOSIS — N529 Male erectile dysfunction, unspecified: Secondary | ICD-10-CM | POA: Diagnosis not present

## 2021-04-02 DIAGNOSIS — I10 Essential (primary) hypertension: Secondary | ICD-10-CM | POA: Diagnosis not present

## 2021-04-02 DIAGNOSIS — N320 Bladder-neck obstruction: Secondary | ICD-10-CM | POA: Diagnosis not present

## 2021-04-08 ENCOUNTER — Other Ambulatory Visit: Payer: Self-pay | Admitting: Internal Medicine

## 2021-04-08 DIAGNOSIS — C0680 Malignant neoplasm of overlapping sites of unspecified parts of mouth: Secondary | ICD-10-CM | POA: Diagnosis not present

## 2021-04-08 DIAGNOSIS — E1151 Type 2 diabetes mellitus with diabetic peripheral angiopathy without gangrene: Secondary | ICD-10-CM | POA: Diagnosis not present

## 2021-04-08 DIAGNOSIS — M722 Plantar fascial fibromatosis: Secondary | ICD-10-CM | POA: Diagnosis not present

## 2021-04-08 DIAGNOSIS — I2510 Atherosclerotic heart disease of native coronary artery without angina pectoris: Secondary | ICD-10-CM | POA: Diagnosis not present

## 2021-04-08 DIAGNOSIS — C031 Malignant neoplasm of lower gum: Secondary | ICD-10-CM | POA: Diagnosis not present

## 2021-04-08 DIAGNOSIS — Z483 Aftercare following surgery for neoplasm: Secondary | ICD-10-CM | POA: Diagnosis not present

## 2021-04-08 DIAGNOSIS — I10 Essential (primary) hypertension: Secondary | ICD-10-CM | POA: Diagnosis not present

## 2021-04-08 DIAGNOSIS — I69354 Hemiplegia and hemiparesis following cerebral infarction affecting left non-dominant side: Secondary | ICD-10-CM | POA: Diagnosis not present

## 2021-04-08 DIAGNOSIS — E1140 Type 2 diabetes mellitus with diabetic neuropathy, unspecified: Secondary | ICD-10-CM | POA: Diagnosis not present

## 2021-04-08 DIAGNOSIS — M5416 Radiculopathy, lumbar region: Secondary | ICD-10-CM | POA: Diagnosis not present

## 2021-04-15 DIAGNOSIS — E1151 Type 2 diabetes mellitus with diabetic peripheral angiopathy without gangrene: Secondary | ICD-10-CM | POA: Diagnosis not present

## 2021-04-15 DIAGNOSIS — C031 Malignant neoplasm of lower gum: Secondary | ICD-10-CM | POA: Diagnosis not present

## 2021-04-15 DIAGNOSIS — Z483 Aftercare following surgery for neoplasm: Secondary | ICD-10-CM | POA: Diagnosis not present

## 2021-04-15 DIAGNOSIS — C0680 Malignant neoplasm of overlapping sites of unspecified parts of mouth: Secondary | ICD-10-CM | POA: Diagnosis not present

## 2021-04-15 DIAGNOSIS — I69354 Hemiplegia and hemiparesis following cerebral infarction affecting left non-dominant side: Secondary | ICD-10-CM | POA: Diagnosis not present

## 2021-04-15 DIAGNOSIS — I10 Essential (primary) hypertension: Secondary | ICD-10-CM | POA: Diagnosis not present

## 2021-04-15 DIAGNOSIS — E1140 Type 2 diabetes mellitus with diabetic neuropathy, unspecified: Secondary | ICD-10-CM | POA: Diagnosis not present

## 2021-04-15 DIAGNOSIS — I2510 Atherosclerotic heart disease of native coronary artery without angina pectoris: Secondary | ICD-10-CM | POA: Diagnosis not present

## 2021-04-15 DIAGNOSIS — M722 Plantar fascial fibromatosis: Secondary | ICD-10-CM | POA: Diagnosis not present

## 2021-04-15 DIAGNOSIS — M5416 Radiculopathy, lumbar region: Secondary | ICD-10-CM | POA: Diagnosis not present

## 2021-04-24 ENCOUNTER — Other Ambulatory Visit: Payer: Self-pay

## 2021-04-24 ENCOUNTER — Ambulatory Visit (INDEPENDENT_AMBULATORY_CARE_PROVIDER_SITE_OTHER): Payer: Medicare HMO | Admitting: Internal Medicine

## 2021-04-24 ENCOUNTER — Encounter: Payer: Self-pay | Admitting: Internal Medicine

## 2021-04-24 DIAGNOSIS — I69354 Hemiplegia and hemiparesis following cerebral infarction affecting left non-dominant side: Secondary | ICD-10-CM | POA: Diagnosis not present

## 2021-04-24 DIAGNOSIS — C0680 Malignant neoplasm of overlapping sites of unspecified parts of mouth: Secondary | ICD-10-CM | POA: Diagnosis not present

## 2021-04-24 DIAGNOSIS — R296 Repeated falls: Secondary | ICD-10-CM

## 2021-04-24 DIAGNOSIS — C069 Malignant neoplasm of mouth, unspecified: Secondary | ICD-10-CM

## 2021-04-24 DIAGNOSIS — F4321 Adjustment disorder with depressed mood: Secondary | ICD-10-CM | POA: Diagnosis not present

## 2021-04-24 DIAGNOSIS — E440 Moderate protein-calorie malnutrition: Secondary | ICD-10-CM

## 2021-04-24 DIAGNOSIS — I10 Essential (primary) hypertension: Secondary | ICD-10-CM | POA: Diagnosis not present

## 2021-04-24 DIAGNOSIS — M5416 Radiculopathy, lumbar region: Secondary | ICD-10-CM | POA: Diagnosis not present

## 2021-04-24 DIAGNOSIS — E1151 Type 2 diabetes mellitus with diabetic peripheral angiopathy without gangrene: Secondary | ICD-10-CM | POA: Diagnosis not present

## 2021-04-24 DIAGNOSIS — Z483 Aftercare following surgery for neoplasm: Secondary | ICD-10-CM | POA: Diagnosis not present

## 2021-04-24 DIAGNOSIS — I2510 Atherosclerotic heart disease of native coronary artery without angina pectoris: Secondary | ICD-10-CM | POA: Diagnosis not present

## 2021-04-24 DIAGNOSIS — M722 Plantar fascial fibromatosis: Secondary | ICD-10-CM | POA: Diagnosis not present

## 2021-04-24 DIAGNOSIS — C031 Malignant neoplasm of lower gum: Secondary | ICD-10-CM | POA: Diagnosis not present

## 2021-04-24 DIAGNOSIS — R69 Illness, unspecified: Secondary | ICD-10-CM | POA: Diagnosis not present

## 2021-04-24 DIAGNOSIS — E1140 Type 2 diabetes mellitus with diabetic neuropathy, unspecified: Secondary | ICD-10-CM | POA: Diagnosis not present

## 2021-04-24 MED ORDER — MEGESTROL ACETATE 40 MG PO TABS: 40.0000 mg | ORAL_TABLET | Freq: Every day | ORAL | 3 refills | Status: DC

## 2021-04-24 NOTE — Assessment & Plan Note (Addendum)
S/p surgery: Partial mandibular alveolectomy.  The patient is complaining of bad taste in the mouth.  We advised to gargle w/Baking soda/salt. Use [employer] Peroxicare tooth paste Do not use a mouthwash

## 2021-04-24 NOTE — Assessment & Plan Note (Addendum)
In PT now -we will continue Using a cane D/c Triamt/HCTZ

## 2021-04-24 NOTE — Assessment & Plan Note (Addendum)
Discussed.  Had is not doing too well. Ed declined antidepressants Seeing a counselor  -we will continue

## 2021-04-24 NOTE — Assessment & Plan Note (Addendum)
Multifactorial.  Worse.  Obtain CMET Start Megace

## 2021-04-24 NOTE — Patient Instructions (Addendum)
Gargle w/Baking soda/salt Use [employer] Peroxicare tooth paste Do not use a mouthwash  Just mix 1/4 teaspoon of baking soda and 1/8 teaspoon of salt in 1 cup of warm water. Stir it up. Then swish it around in your mouth and spit it out. Do this every 1 to 2 hours during the day.  Stop MAXZIDE and possibly Losartan

## 2021-04-24 NOTE — Progress Notes (Signed)
Subjective:  Patient ID: Francis Ade., male    DOB: June 19, 1940  Age: 80 y.o. MRN: [id number]  CC: Follow-up (3 month f/u)   HPI Francis M Strub Jr. presents for bad taste in the mouth post-op, h/o CVA, falls -he is in PT, ataxia S/p partial mandibular alveolectomy by Dr. Arville Care C/o wt loss, low BP Ed's Juliann Pulse moved in w/Ed 2022  Outpatient Medications Prior to Visit  Medication Sig Dispense Refill   amLODipine (NORVASC) 2.5 MG tablet TAKE 1 TABLET BY MOUTH EVERY DAY 90 tablet 2   cholecalciferol (VITAMIN D) 1000 UNITS tablet Take 1,000 Units by mouth daily.     clopidogrel (PLAVIX) 75 MG tablet TAKE 1 TABLET BY MOUTH EVERY DAY (Patient taking differently: Take 75 mg by mouth daily.) 90 tablet 3   docusate sodium (COLACE) 100 MG capsule Take 100 mg by mouth 2 (two) times daily.     hydroxypropyl methylcellulose / hypromellose (ISOPTO TEARS / GONIOVISC) 2.5 % ophthalmic solution Place 1 drop into both eyes 3 (three) times daily as needed for dry eyes.     hydrOXYzine (ATARAX/VISTARIL) 25 MG tablet TAKE 1 TABLET BY MOUTH AT BEDTIME AS NEEDED FOR ITCHING. (Patient taking differently: Take 25 mg by mouth in the morning.) 90 tablet 1   LORazepam (ATIVAN) 0.5 MG tablet Take 1 tablet (0.5 mg total) by mouth 2 (two) times daily as needed for anxiety. 60 tablet 2   losartan (COZAAR) 50 MG tablet TAKE 1 TABLET BY MOUTH DAILY 90 tablet 2   metFORMIN (GLUCOPHAGE) 500 MG tablet Take 1 tablet (500 mg total) by mouth daily with breakfast. 90 tablet 3   Omega-3 Fatty Acids (FISH OIL) 1000 MG CAPS Take 1,000 mg by mouth 2 (two) times daily.     pantoprazole (PROTONIX) 40 MG tablet Take 1 tablet (40 mg total) by mouth daily. 90 tablet 3   tamsulosin (FLOMAX) 0.4 MG CAPS capsule TAKE 1 CAPSULE BY MOUTH EVERY DAY (Patient taking differently: Take 0.4 mg by mouth daily.) 90 capsule 3   vitamin C (ASCORBIC ACID) 250 MG tablet Take 250 mg by mouth daily.     triamterene-hydrochlorothiazide  (MAXZIDE-25) 37.5-25 MG tablet Take 0.5 tablets by mouth daily. 45 tablet 3   cetirizine (ZYRTEC ALLERGY) 10 MG tablet Take 1 tablet (10 mg total) by mouth daily. (Patient taking differently: Take 10 mg by mouth daily as needed for allergies.) 30 tablet 11   clindamycin (CLEOCIN) 300 MG capsule Take 1 capsule (300 mg total) by mouth 3 (three) times daily. (Patient not taking: Reported on 04/24/2021) 15 capsule 0   ondansetron (ZOFRAN ODT) 8 MG disintegrating tablet Take 1 tablet (8 mg total) by mouth every 8 (eight) hours as needed for nausea or vomiting. (Patient not taking: Reported on 04/24/2021) 20 tablet 0   oxyCODONE (ROXICODONE) 5 MG/5ML solution Take 10 mLs (10 mg total) by mouth every 6 (six) hours as needed for severe pain. (Patient not taking: Reported on 04/24/2021) 280 mL 0   No facility-administered medications prior to visit.    ROS: Review of Systems  Constitutional:  Positive for fatigue and unexpected weight change. Negative for appetite change.  HENT:  Negative for congestion, nosebleeds, sneezing, sore throat and trouble swallowing.   Eyes:  Negative for itching and visual disturbance.  Respiratory:  Negative for cough.   Cardiovascular:  Negative for chest pain, palpitations and leg swelling.  Gastrointestinal:  Negative for abdominal distention, blood in stool, diarrhea and nausea.  Genitourinary:  Negative for frequency and hematuria.  Musculoskeletal:  Positive for gait problem. Negative for back pain, joint swelling and neck pain.  Skin:  Negative for rash.  Neurological:  Negative for dizziness, tremors, speech difficulty and weakness.  Psychiatric/Behavioral:  Positive for decreased concentration, dysphoric mood and sleep disturbance. Negative for agitation and suicidal ideas. The patient is nervous/anxious.    Objective:  BP 112/62 (BP Location: Left Arm)   Pulse 71   Temp 98.3 F (36.8 C) (Oral)   Ht 6\' 2"  (1.88 m)   Wt 178 lb 9.6 oz (81 kg)   SpO2 97%    BMI 22.93 kg/m   BP Readings from Last 3 Encounters:  04/24/21 112/62  02/27/21 (!) 143/95  02/14/21 118/61    Wt Readings from Last 3 Encounters:  04/24/21 178 lb 9.6 oz (81 kg)  02/23/21 183 lb 6.8 oz (83.2 kg)  02/14/21 181 lb (82.1 kg)    Physical Exam Constitutional:      General: He is not in acute distress.    Appearance: He is well-developed. He is not ill-appearing or toxic-appearing.     Comments: NAD  Eyes:     Conjunctiva/sclera: Conjunctivae normal.     Pupils: Pupils are equal, round, and reactive to light.  Neck:     Thyroid: No thyromegaly.     Vascular: No JVD.  Cardiovascular:     Rate and Rhythm: Normal rate and regular rhythm.     Heart sounds: Normal heart sounds. No murmur heard.   No friction rub. No gallop.  Pulmonary:     Effort: Pulmonary effort is normal. No respiratory distress.     Breath sounds: Normal breath sounds. No wheezing or rales.  Chest:     Chest wall: No tenderness.  Abdominal:     General: Bowel sounds are normal. There is no distension.     Palpations: Abdomen is soft. There is no mass.     Tenderness: There is no abdominal tenderness. There is no guarding or rebound.  Musculoskeletal:        General: No tenderness. Normal range of motion.     Cervical back: Normal range of motion.  Lymphadenopathy:     Cervical: No cervical adenopathy.  Skin:    General: Skin is warm and dry.     Findings: No rash.  Neurological:     Mental Status: He is alert and oriented to person, place, and time.     Cranial Nerves: No cranial nerve deficit.     Motor: No abnormal muscle tone.     Coordination: Coordination normal.     Gait: Gait normal.     Deep Tendon Reflexes: Reflexes are normal and symmetric.  Psychiatric:        Behavior: Behavior normal.        Thought Content: Thought content normal.        Judgment: Judgment normal.   Post-op area is clean Thin Using a cane Ataxic Tearful    A total time of 45 minutes was spent  preparing to see the patient, reviewing tests, x-rays, operative reports and hospital records.  Also, obtaining history and performing comprehensive physical exam.  Additionally, counseling the patient regarding the above listed issues - grief, falls, low BP, diet.   Finally, documenting clinical information in the health records, coordination of care, educating the patient. It is a complex case.   Lab Results  Component Value Date   WBC 15.1 (H) 02/21/2021   HGB 13.3 02/21/2021  HCT 39.7 02/21/2021   PLT 302 02/21/2021   GLUCOSE 212 (H) 02/21/2021   CHOL 185 11/10/2019   TRIG 78 11/10/2019   HDL 34 (L) 11/10/2019   LDLDIRECT 105.0 05/31/2019   LDLCALC 135 (H) 11/10/2019   ALT 12 02/18/2021   AST 18 02/18/2021   NA 136 02/21/2021   K 4.2 02/21/2021   CL 97 (L) 02/21/2021   CREATININE 0.84 02/21/2021   BUN 20 02/21/2021   CO2 27 02/21/2021   TSH 1.14 01/16/2021   PSA 0.30 05/31/2019   INR 1.0 11/09/2019   HGBA1C 6.8 (H) 01/16/2021    DG Abd Portable 1V  Result Date: 02/18/2021 CLINICAL DATA:  Feeding tube placement. EXAM: PORTABLE ABDOMEN - 1 VIEW COMPARISON: CT abdomen and pelvis 07/20/2017. FINDINGS: Feeding tube tip is at the level of the gastric antrum. No dilated bowel loops are seen in the upper abdomen. Degenerative changes affect the spine. IMPRESSION: 1. Feeding tube tip is at the level of the gastric antrum. Electronically Signed   By: Ronney Asters M.D.   On: 02/18/2021 16:24    Assessment & Plan:   Problem List Items Addressed This Visit     Falls frequently    In PT now -we will continue Using a cane D/c Triamt/HCTZ      Grief    Discussed.  Had is not doing too well. Ed declined antidepressants Seeing a counselor  -we will continue      Hypertension    We will discontinue diuretic due to falls      Malnutrition of moderate degree    Multifactorial.  Worse.  Obtain CMET Start Megace      Oral cancer [facility])    S/p surgery: Partial mandibular  alveolectomy.  The patient is complaining of bad taste in the mouth.  We advised to gargle w/Baking soda/salt. Use [employer] Peroxicare tooth paste Do not use a mouthwash      Relevant Medications   megestrol (MEGACE) 40 MG tablet      Meds ordered this encounter  Medications   megestrol (MEGACE) 40 MG tablet    Sig: Take 1 tablet (40 mg total) by mouth daily.    Dispense:  30 tablet    Refill:  3      Follow-up: Return in about 6 weeks (around 06/05/2021) for a follow-up visit.  [name], MD

## 2021-04-27 NOTE — Assessment & Plan Note (Signed)
We will discontinue diuretic due to falls

## 2021-04-28 IMAGING — CR DG CHEST 2V
2 series · 2 of 2 positions shown · non-contrast
Comparison: 11/29/2012

CLINICAL DATA: Left-sided numbness and weakness for 1 day, stroke

EXAM:
CHEST - 2 VIEW

[chest lat]
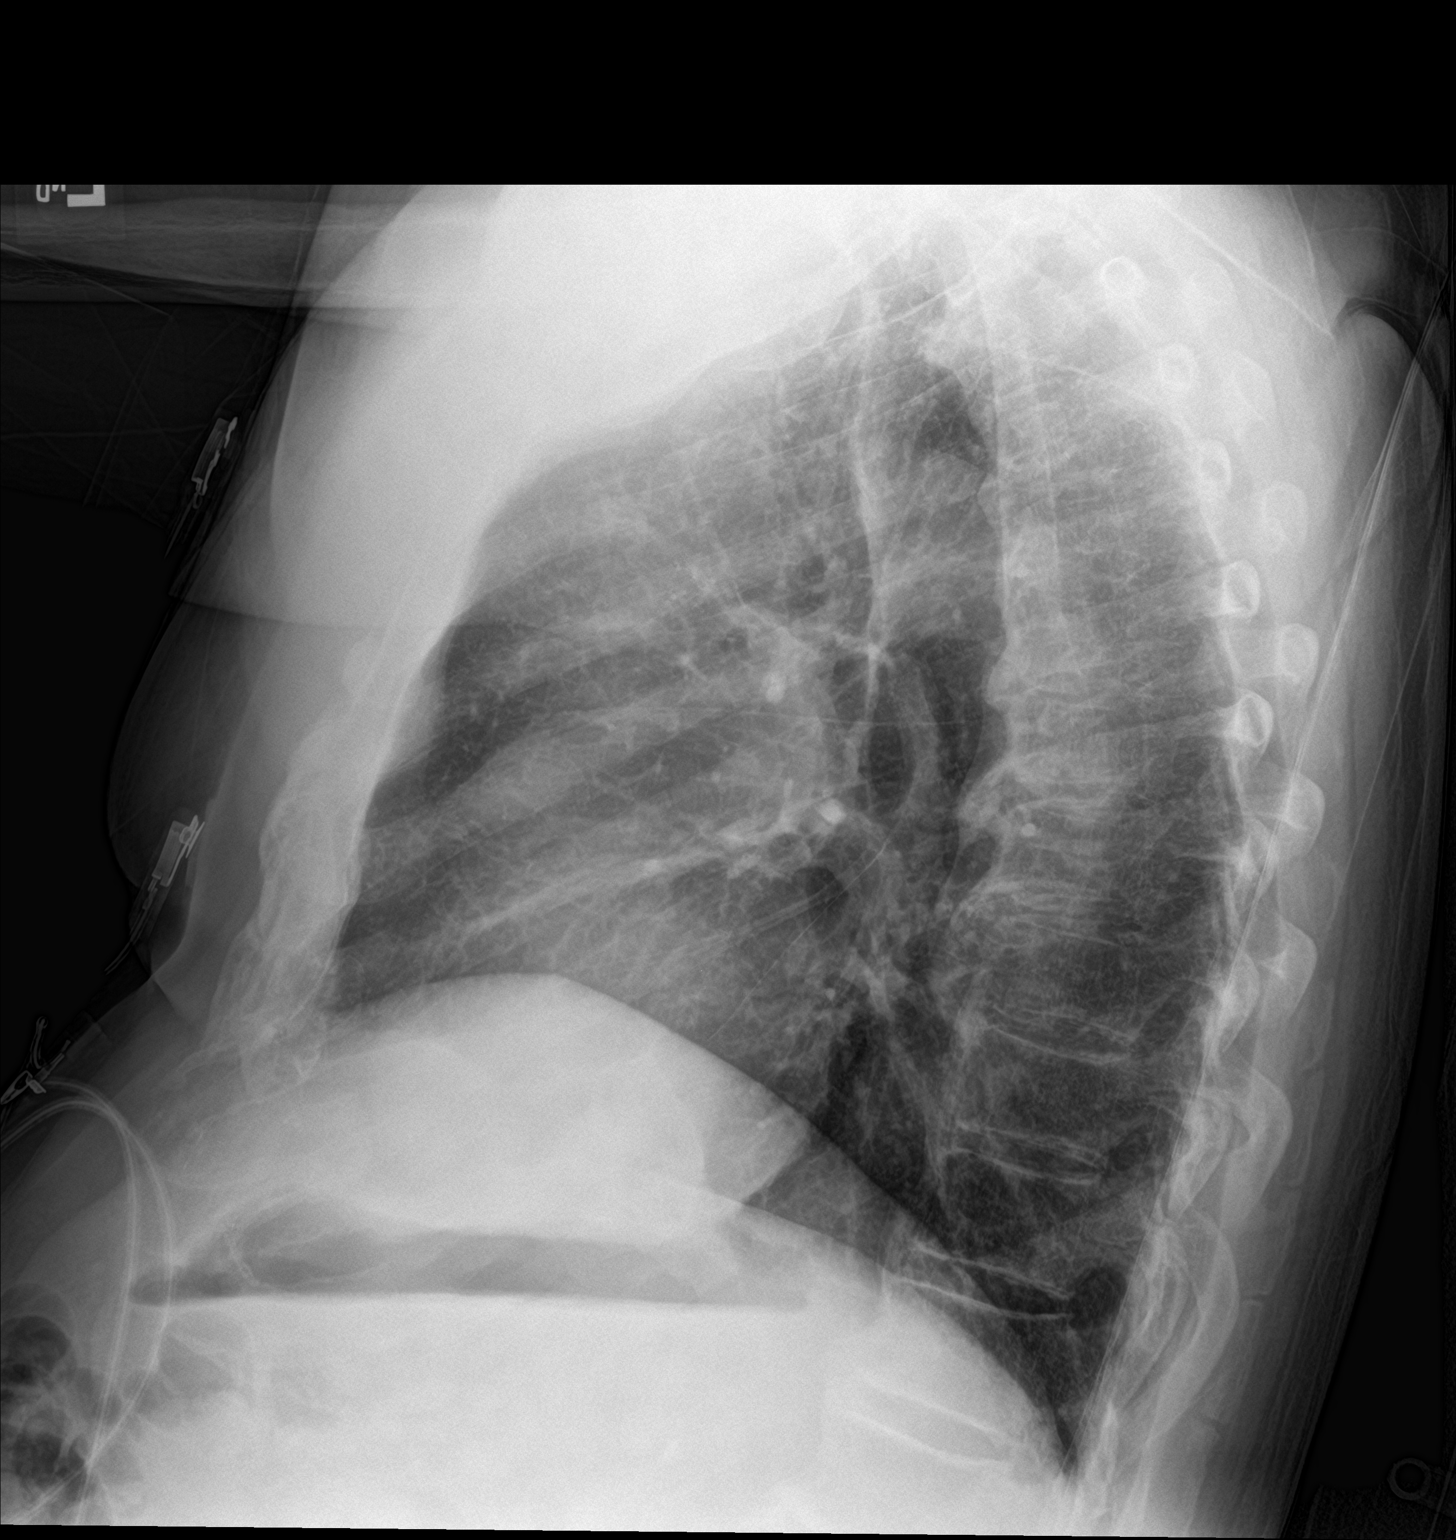

[chest ap]
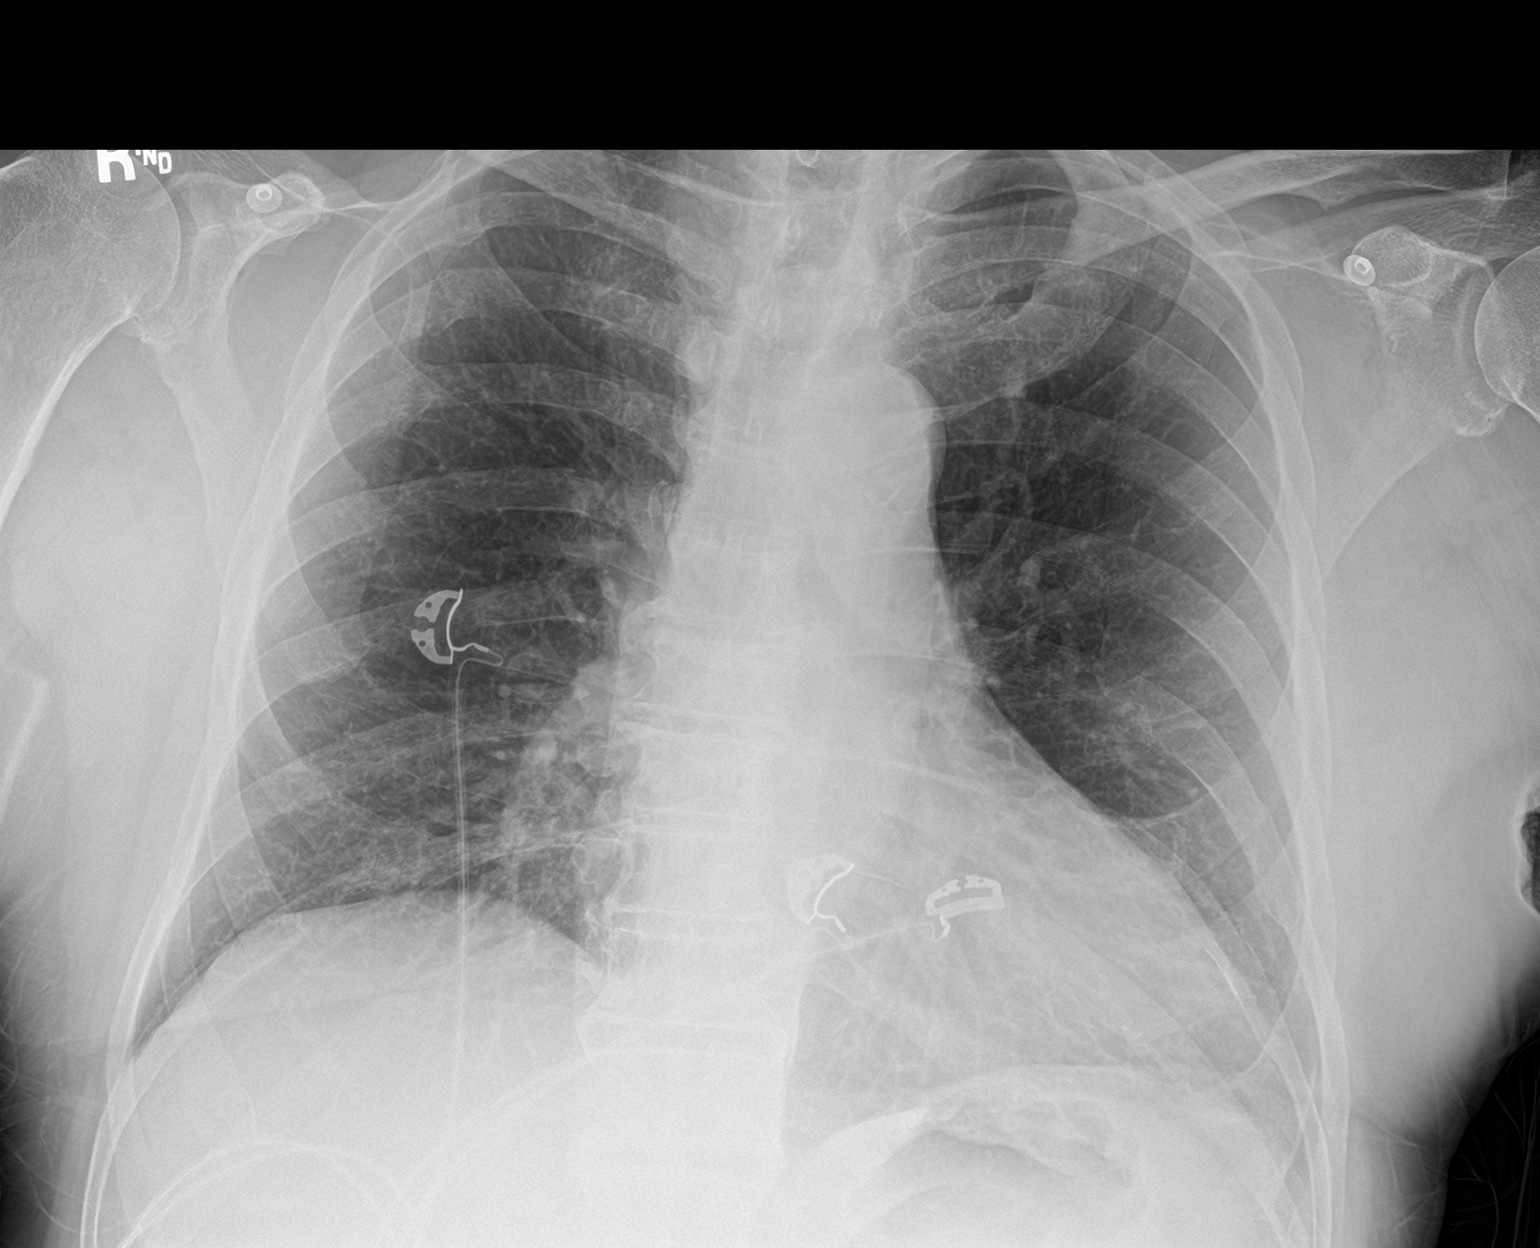

[2 of 2 positions shown; findings below may reference images not displayed]

FINDINGS: The heart size and mediastinal contours are within normal limits.
Both lungs are clear. The visualized skeletal structures are
unremarkable.
IMPRESSION: No active cardiopulmonary disease.

## 2021-04-28 IMAGING — DX DG ELBOW COMPLETE 3+V*L*
4 series · 4 of 4 positions shown · non-contrast
Comparison: None.

CLINICAL DATA: Fell last evening, numbness and pain at left elbow

EXAM:
LEFT ELBOW - COMPLETE 3+ VIEW

[elbow ap]
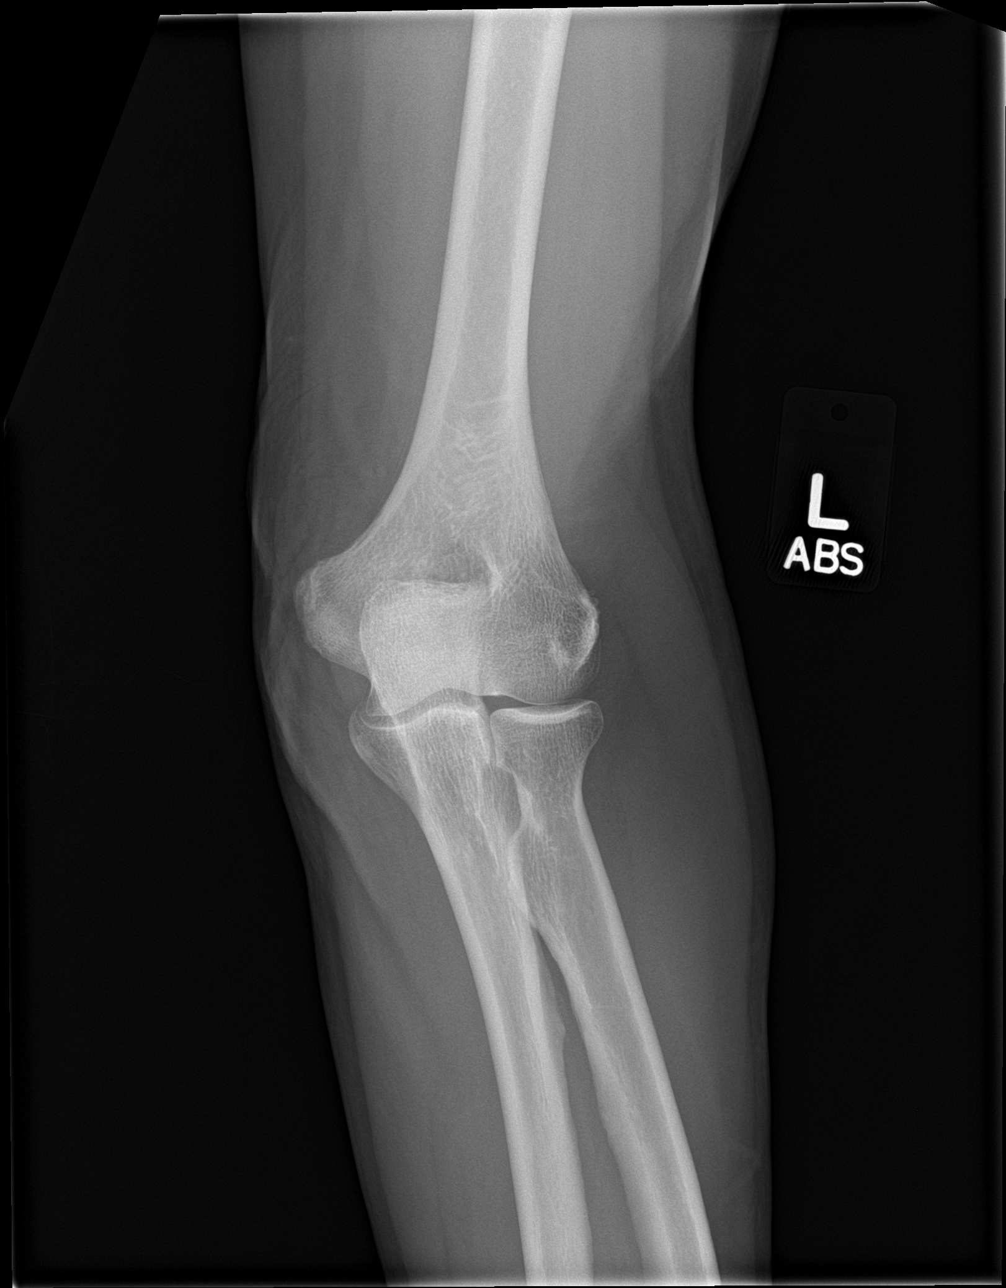

[elbow obl (1 of 2)]
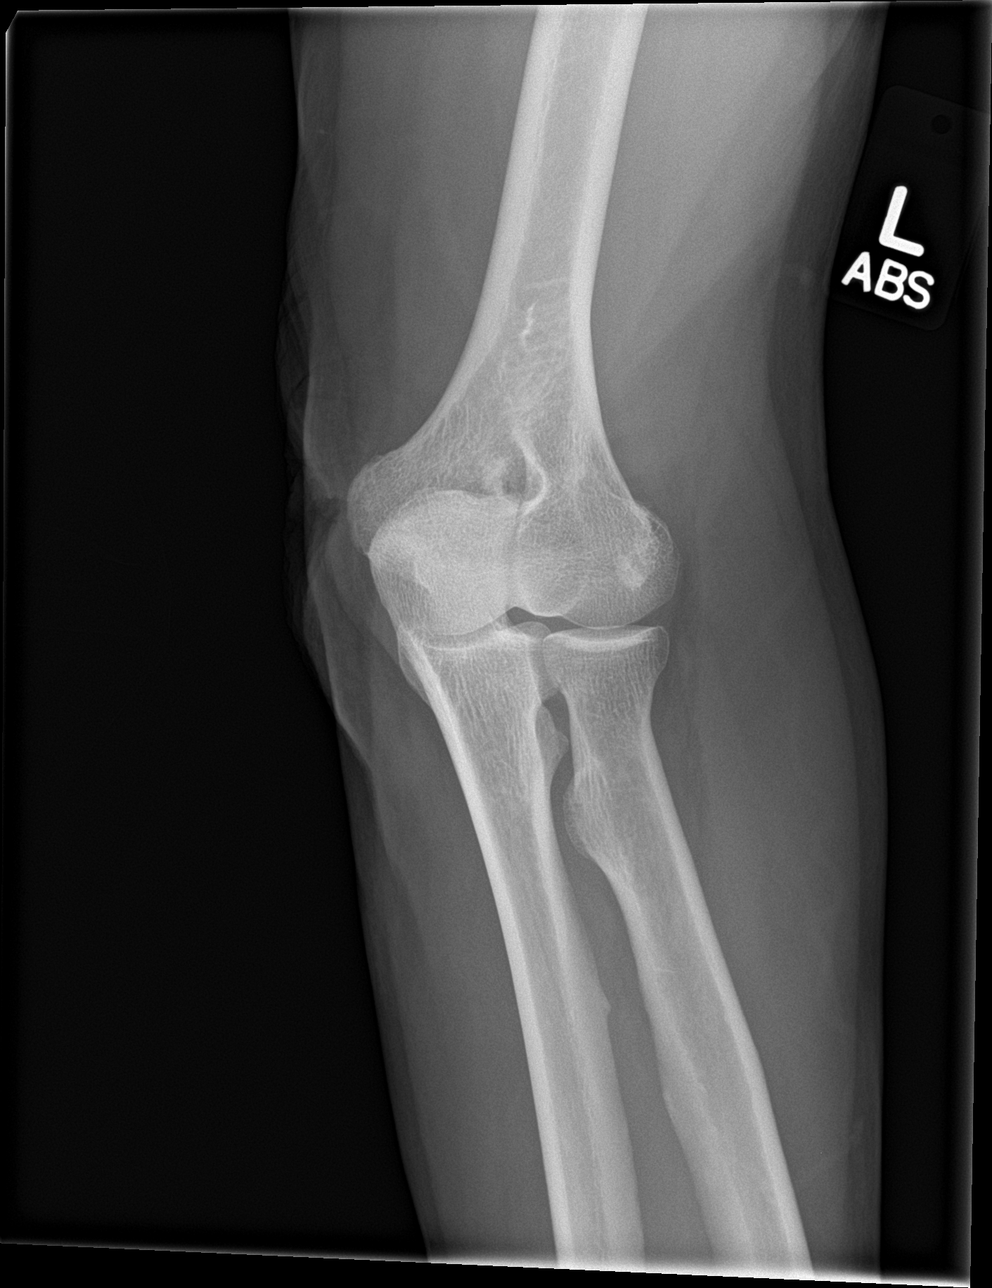

[elbow obl (2 of 2)]
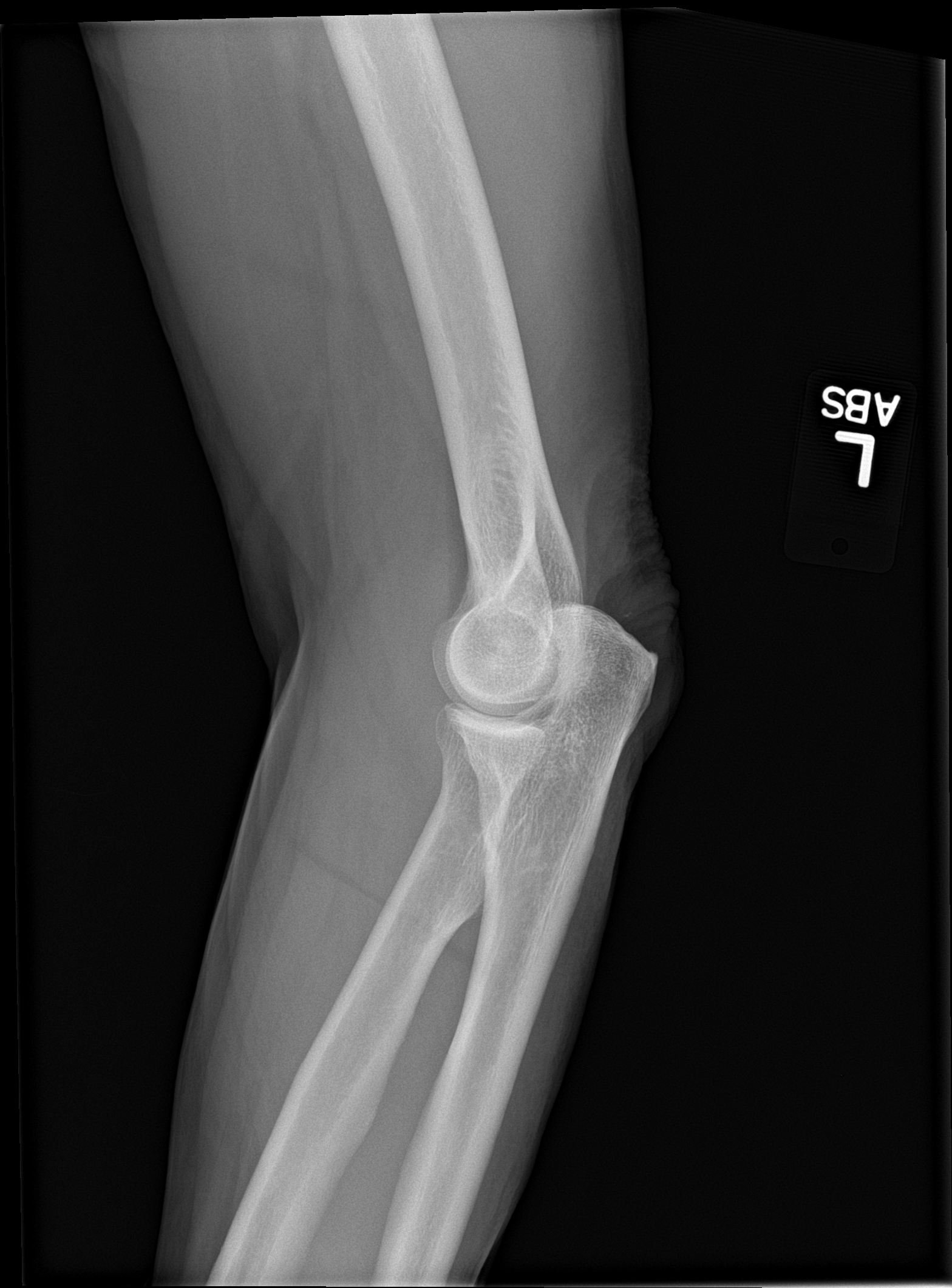

[elbow lat]
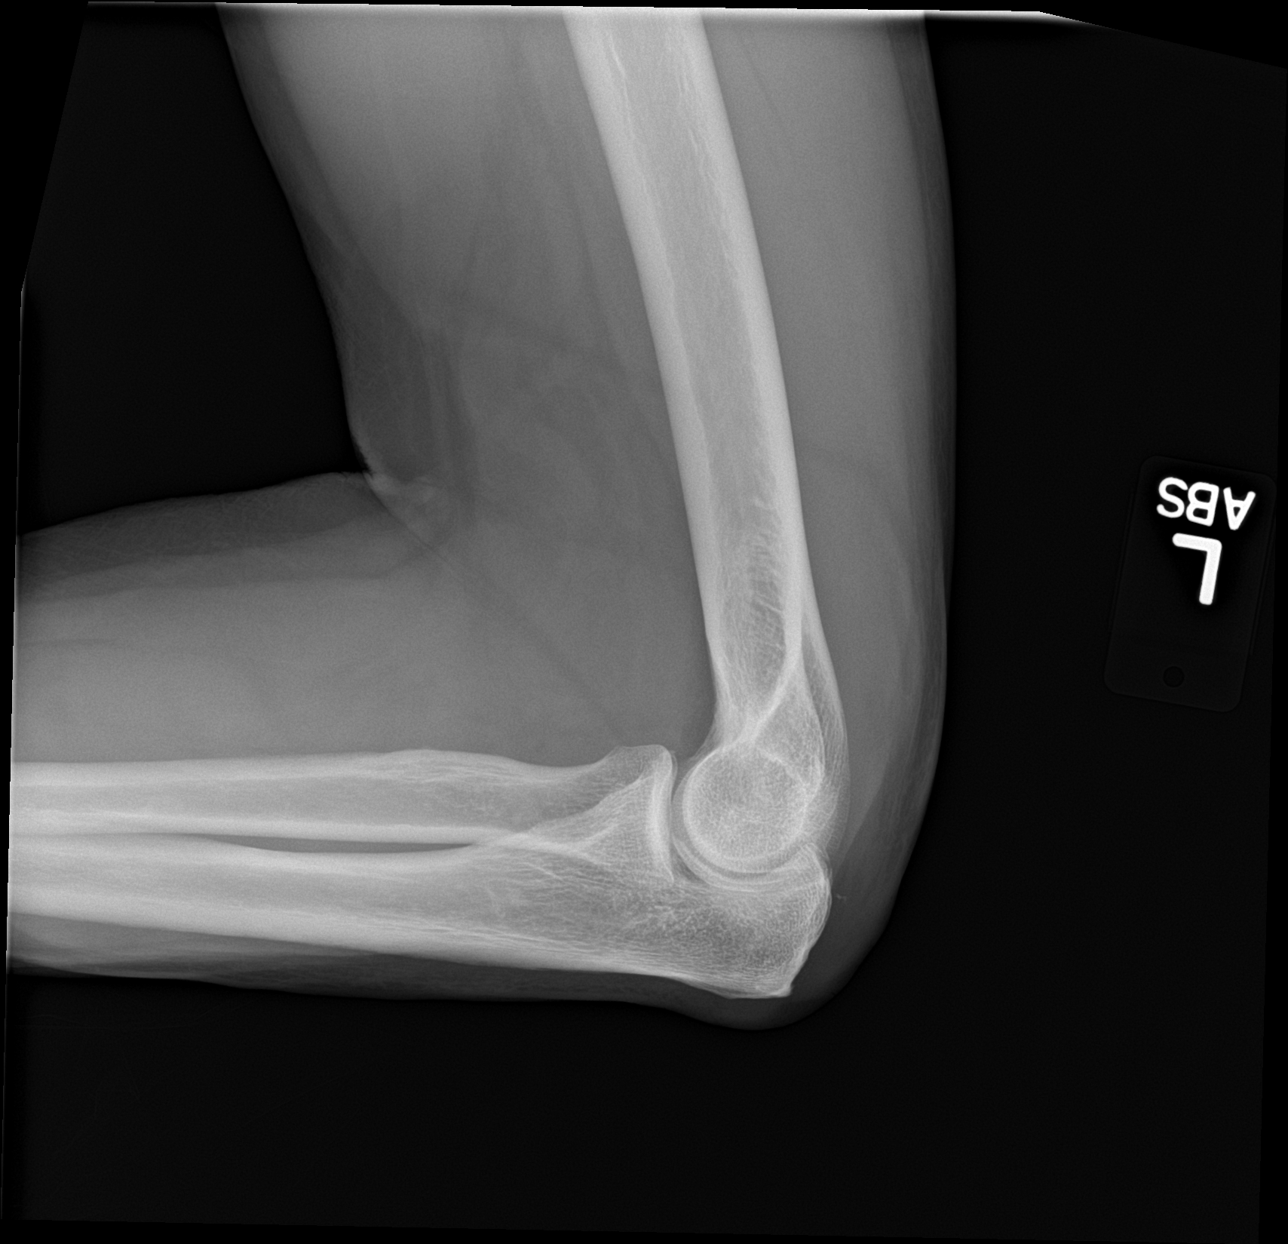

[4 of 4 positions shown; findings below may reference images not displayed]

FINDINGS: Frontal, bilateral oblique, and lateral views of the left elbow are
obtained. No fracture, subluxation, or dislocation. Joint spaces are
well preserved. No joint effusion.
IMPRESSION: 1. Unremarkable left elbow.

## 2021-04-30 DIAGNOSIS — Z483 Aftercare following surgery for neoplasm: Secondary | ICD-10-CM | POA: Diagnosis not present

## 2021-04-30 DIAGNOSIS — E1151 Type 2 diabetes mellitus with diabetic peripheral angiopathy without gangrene: Secondary | ICD-10-CM | POA: Diagnosis not present

## 2021-04-30 DIAGNOSIS — I10 Essential (primary) hypertension: Secondary | ICD-10-CM | POA: Diagnosis not present

## 2021-04-30 DIAGNOSIS — C031 Malignant neoplasm of lower gum: Secondary | ICD-10-CM | POA: Diagnosis not present

## 2021-04-30 DIAGNOSIS — I2510 Atherosclerotic heart disease of native coronary artery without angina pectoris: Secondary | ICD-10-CM | POA: Diagnosis not present

## 2021-04-30 DIAGNOSIS — M5416 Radiculopathy, lumbar region: Secondary | ICD-10-CM | POA: Diagnosis not present

## 2021-04-30 DIAGNOSIS — M722 Plantar fascial fibromatosis: Secondary | ICD-10-CM | POA: Diagnosis not present

## 2021-04-30 DIAGNOSIS — E1140 Type 2 diabetes mellitus with diabetic neuropathy, unspecified: Secondary | ICD-10-CM | POA: Diagnosis not present

## 2021-04-30 DIAGNOSIS — C0680 Malignant neoplasm of overlapping sites of unspecified parts of mouth: Secondary | ICD-10-CM | POA: Diagnosis not present

## 2021-04-30 DIAGNOSIS — I69354 Hemiplegia and hemiparesis following cerebral infarction affecting left non-dominant side: Secondary | ICD-10-CM | POA: Diagnosis not present

## 2021-05-07 DIAGNOSIS — I69354 Hemiplegia and hemiparesis following cerebral infarction affecting left non-dominant side: Secondary | ICD-10-CM | POA: Diagnosis not present

## 2021-05-07 DIAGNOSIS — I2510 Atherosclerotic heart disease of native coronary artery without angina pectoris: Secondary | ICD-10-CM | POA: Diagnosis not present

## 2021-05-07 DIAGNOSIS — E1151 Type 2 diabetes mellitus with diabetic peripheral angiopathy without gangrene: Secondary | ICD-10-CM | POA: Diagnosis not present

## 2021-05-07 DIAGNOSIS — M5416 Radiculopathy, lumbar region: Secondary | ICD-10-CM | POA: Diagnosis not present

## 2021-05-07 DIAGNOSIS — I10 Essential (primary) hypertension: Secondary | ICD-10-CM | POA: Diagnosis not present

## 2021-05-07 DIAGNOSIS — C031 Malignant neoplasm of lower gum: Secondary | ICD-10-CM | POA: Diagnosis not present

## 2021-05-07 DIAGNOSIS — E1140 Type 2 diabetes mellitus with diabetic neuropathy, unspecified: Secondary | ICD-10-CM | POA: Diagnosis not present

## 2021-05-07 DIAGNOSIS — C0680 Malignant neoplasm of overlapping sites of unspecified parts of mouth: Secondary | ICD-10-CM | POA: Diagnosis not present

## 2021-05-07 DIAGNOSIS — M722 Plantar fascial fibromatosis: Secondary | ICD-10-CM | POA: Diagnosis not present

## 2021-05-07 DIAGNOSIS — Z483 Aftercare following surgery for neoplasm: Secondary | ICD-10-CM | POA: Diagnosis not present

## 2021-05-14 DIAGNOSIS — C0680 Malignant neoplasm of overlapping sites of unspecified parts of mouth: Secondary | ICD-10-CM | POA: Diagnosis not present

## 2021-05-14 DIAGNOSIS — E1151 Type 2 diabetes mellitus with diabetic peripheral angiopathy without gangrene: Secondary | ICD-10-CM | POA: Diagnosis not present

## 2021-05-14 DIAGNOSIS — I2510 Atherosclerotic heart disease of native coronary artery without angina pectoris: Secondary | ICD-10-CM | POA: Diagnosis not present

## 2021-05-14 DIAGNOSIS — M722 Plantar fascial fibromatosis: Secondary | ICD-10-CM | POA: Diagnosis not present

## 2021-05-14 DIAGNOSIS — I69354 Hemiplegia and hemiparesis following cerebral infarction affecting left non-dominant side: Secondary | ICD-10-CM | POA: Diagnosis not present

## 2021-05-14 DIAGNOSIS — I10 Essential (primary) hypertension: Secondary | ICD-10-CM | POA: Diagnosis not present

## 2021-05-14 DIAGNOSIS — Z483 Aftercare following surgery for neoplasm: Secondary | ICD-10-CM | POA: Diagnosis not present

## 2021-05-14 DIAGNOSIS — C031 Malignant neoplasm of lower gum: Secondary | ICD-10-CM | POA: Diagnosis not present

## 2021-05-14 DIAGNOSIS — M5416 Radiculopathy, lumbar region: Secondary | ICD-10-CM | POA: Diagnosis not present

## 2021-05-14 DIAGNOSIS — E1140 Type 2 diabetes mellitus with diabetic neuropathy, unspecified: Secondary | ICD-10-CM | POA: Diagnosis not present

## 2021-05-28 DIAGNOSIS — M5416 Radiculopathy, lumbar region: Secondary | ICD-10-CM | POA: Diagnosis not present

## 2021-05-28 DIAGNOSIS — I69354 Hemiplegia and hemiparesis following cerebral infarction affecting left non-dominant side: Secondary | ICD-10-CM | POA: Diagnosis not present

## 2021-05-28 DIAGNOSIS — Z483 Aftercare following surgery for neoplasm: Secondary | ICD-10-CM | POA: Diagnosis not present

## 2021-05-28 DIAGNOSIS — E1140 Type 2 diabetes mellitus with diabetic neuropathy, unspecified: Secondary | ICD-10-CM | POA: Diagnosis not present

## 2021-05-28 DIAGNOSIS — E1151 Type 2 diabetes mellitus with diabetic peripheral angiopathy without gangrene: Secondary | ICD-10-CM | POA: Diagnosis not present

## 2021-05-28 DIAGNOSIS — C0680 Malignant neoplasm of overlapping sites of unspecified parts of mouth: Secondary | ICD-10-CM | POA: Diagnosis not present

## 2021-05-28 DIAGNOSIS — C031 Malignant neoplasm of lower gum: Secondary | ICD-10-CM | POA: Diagnosis not present

## 2021-05-28 DIAGNOSIS — I2510 Atherosclerotic heart disease of native coronary artery without angina pectoris: Secondary | ICD-10-CM | POA: Diagnosis not present

## 2021-05-28 DIAGNOSIS — M722 Plantar fascial fibromatosis: Secondary | ICD-10-CM | POA: Diagnosis not present

## 2021-05-28 DIAGNOSIS — I10 Essential (primary) hypertension: Secondary | ICD-10-CM | POA: Diagnosis not present

## 2021-06-04 DIAGNOSIS — I2510 Atherosclerotic heart disease of native coronary artery without angina pectoris: Secondary | ICD-10-CM | POA: Diagnosis not present

## 2021-06-04 DIAGNOSIS — C031 Malignant neoplasm of lower gum: Secondary | ICD-10-CM | POA: Diagnosis not present

## 2021-06-04 DIAGNOSIS — E1151 Type 2 diabetes mellitus with diabetic peripheral angiopathy without gangrene: Secondary | ICD-10-CM | POA: Diagnosis not present

## 2021-06-04 DIAGNOSIS — C0680 Malignant neoplasm of overlapping sites of unspecified parts of mouth: Secondary | ICD-10-CM | POA: Diagnosis not present

## 2021-06-04 DIAGNOSIS — E1140 Type 2 diabetes mellitus with diabetic neuropathy, unspecified: Secondary | ICD-10-CM | POA: Diagnosis not present

## 2021-06-04 DIAGNOSIS — Z483 Aftercare following surgery for neoplasm: Secondary | ICD-10-CM | POA: Diagnosis not present

## 2021-06-04 DIAGNOSIS — I69354 Hemiplegia and hemiparesis following cerebral infarction affecting left non-dominant side: Secondary | ICD-10-CM | POA: Diagnosis not present

## 2021-06-04 DIAGNOSIS — I10 Essential (primary) hypertension: Secondary | ICD-10-CM | POA: Diagnosis not present

## 2021-06-04 DIAGNOSIS — M5416 Radiculopathy, lumbar region: Secondary | ICD-10-CM | POA: Diagnosis not present

## 2021-06-04 DIAGNOSIS — M722 Plantar fascial fibromatosis: Secondary | ICD-10-CM | POA: Diagnosis not present

## 2021-06-05 ENCOUNTER — Encounter: Payer: Self-pay | Admitting: Internal Medicine

## 2021-06-05 ENCOUNTER — Other Ambulatory Visit: Payer: Self-pay

## 2021-06-05 ENCOUNTER — Ambulatory Visit (INDEPENDENT_AMBULATORY_CARE_PROVIDER_SITE_OTHER): Payer: Medicare HMO | Admitting: Internal Medicine

## 2021-06-05 VITALS — BP 132/70 | HR 77 | Temp 98.2°F | Ht 74.0 in | Wt 178.0 lb

## 2021-06-05 DIAGNOSIS — R739 Hyperglycemia, unspecified: Secondary | ICD-10-CM

## 2021-06-05 DIAGNOSIS — I69354 Hemiplegia and hemiparesis following cerebral infarction affecting left non-dominant side: Secondary | ICD-10-CM

## 2021-06-05 DIAGNOSIS — F4321 Adjustment disorder with depressed mood: Secondary | ICD-10-CM

## 2021-06-05 DIAGNOSIS — R634 Abnormal weight loss: Secondary | ICD-10-CM | POA: Diagnosis not present

## 2021-06-05 DIAGNOSIS — K120 Recurrent oral aphthae: Secondary | ICD-10-CM

## 2021-06-05 DIAGNOSIS — R296 Repeated falls: Secondary | ICD-10-CM

## 2021-06-05 DIAGNOSIS — R69 Illness, unspecified: Secondary | ICD-10-CM | POA: Diagnosis not present

## 2021-06-05 LAB — COMPREHENSIVE METABOLIC PANEL
ALT: 12 U/L (ref 0–53)
AST: 15 U/L (ref 0–37)
Albumin: 4.4 g/dL (ref 3.5–5.2)
Alkaline Phosphatase: 78 U/L (ref 39–117)
BUN: 19 mg/dL (ref 6–23)
CO2: 26 mEq/L (ref 19–32)
Calcium: 9.9 mg/dL (ref 8.4–10.5)
Chloride: 104 mEq/L (ref 96–112)
Creatinine, Ser: 0.97 mg/dL (ref 0.40–1.50)
GFR: 74.82 mL/min (ref >60.00)
Glucose, Bld: 88 mg/dL (ref 70–99)
Potassium: 4.7 mEq/L (ref 3.5–5.1)
Sodium: 137 mEq/L (ref 135–145)
Total Bilirubin: 0.8 mg/dL (ref 0.2–1.2)
Total Protein: 7.6 g/dL (ref 6.0–8.3)

## 2021-06-05 LAB — URINALYSIS
Bilirubin Urine: NEGATIVE
Hgb urine dipstick: NEGATIVE
Ketones, ur: NEGATIVE
Leukocytes,Ua: NEGATIVE
Nitrite: NEGATIVE
Specific Gravity, Urine: >=1.03 — AB (ref 1.000–1.030)
Total Protein, Urine: NEGATIVE
Urine Glucose: NEGATIVE
Urobilinogen, UA: 0.2 (ref 0.0–1.0)
pH: 5.5 (ref 5.0–8.0)

## 2021-06-05 LAB — HEMOGLOBIN A1C: Hgb A1c MFr Bld: 6.2 % (ref 4.6–6.5)

## 2021-06-05 MED ORDER — VALACYCLOVIR HCL 500 MG PO TABS: ORAL_TABLET | ORAL | 1 refills | Status: DC

## 2021-06-05 MED ORDER — AZITHROMYCIN 250 MG PO TABS: ORAL_TABLET | ORAL | 0 refills | Status: DC

## 2021-06-05 NOTE — Assessment & Plan Note (Signed)
Triamc - paste prn  Prescribed Valtrex po prn

## 2021-06-05 NOTE — Assessment & Plan Note (Signed)
On Metformin.  Check A1c 

## 2021-06-05 NOTE — Assessment & Plan Note (Signed)
Better Fell 3 wks ago last Better

## 2021-06-05 NOTE — Assessment & Plan Note (Signed)
Pt's wt was up to 183 lbs - has a cold x 2 wks - lost wt again On Megace

## 2021-06-05 NOTE — Assessment & Plan Note (Signed)
A little better. Discussed Seeing a counselor Lorazepam - rare use

## 2021-06-05 NOTE — Assessment & Plan Note (Signed)
Continue with Plavix and blood pressure control

## 2021-06-05 NOTE — Progress Notes (Signed)
Subjective:  Patient ID: Francis Ade., male    DOB: 01/15/1941  Age: 80 y.o. MRN: [id number]  CC: Follow-up (6 week f/u)   HPI Francis M Beddow Jr. presents for wt loss Pt's wt was up to 183 lbs - has a cold x 2 wks - lost wt again C/o productive cough x 2 wks  Outpatient Medications Prior to Visit  Medication Sig Dispense Refill   amLODipine (NORVASC) 2.5 MG tablet TAKE 1 TABLET BY MOUTH EVERY DAY 90 tablet 2   cholecalciferol (VITAMIN D) 1000 UNITS tablet Take 1,000 Units by mouth daily.     clopidogrel (PLAVIX) 75 MG tablet TAKE 1 TABLET BY MOUTH EVERY DAY (Patient taking differently: Take 75 mg by mouth daily.) 90 tablet 3   docusate sodium (COLACE) 100 MG capsule Take 100 mg by mouth 2 (two) times daily.     hydroxypropyl methylcellulose / hypromellose (ISOPTO TEARS / GONIOVISC) 2.5 % ophthalmic solution Place 1 drop into both eyes 3 (three) times daily as needed for dry eyes.     hydrOXYzine (ATARAX/VISTARIL) 25 MG tablet TAKE 1 TABLET BY MOUTH AT BEDTIME AS NEEDED FOR ITCHING. (Patient taking differently: Take 25 mg by mouth in the morning.) 90 tablet 1   LORazepam (ATIVAN) 0.5 MG tablet Take 1 tablet (0.5 mg total) by mouth 2 (two) times daily as needed for anxiety. 60 tablet 2   losartan (COZAAR) 50 MG tablet TAKE 1 TABLET BY MOUTH DAILY 90 tablet 2   megestrol (MEGACE) 40 MG tablet Take 1 tablet (40 mg total) by mouth daily. 30 tablet 3   metFORMIN (GLUCOPHAGE) 500 MG tablet Take 1 tablet (500 mg total) by mouth daily with breakfast. 90 tablet 3   Omega-3 Fatty Acids (FISH OIL) 1000 MG CAPS Take 1,000 mg by mouth 2 (two) times daily.     pantoprazole (PROTONIX) 40 MG tablet Take 1 tablet (40 mg total) by mouth daily. 90 tablet 3   tamsulosin (FLOMAX) 0.4 MG CAPS capsule TAKE 1 CAPSULE BY MOUTH EVERY DAY (Patient taking differently: Take 0.4 mg by mouth daily.) 90 capsule 3   vitamin C (ASCORBIC ACID) 250 MG tablet Take 250 mg by mouth daily.     No facility-administered  medications prior to visit.    ROS: Review of Systems  Constitutional:  Negative for appetite change, fatigue and unexpected weight change.  HENT:  Negative for congestion, nosebleeds, sneezing, sore throat and trouble swallowing.   Eyes:  Negative for itching and visual disturbance.  Respiratory:  Negative for cough.   Cardiovascular:  Negative for chest pain, palpitations and leg swelling.  Gastrointestinal:  Negative for abdominal distention, blood in stool, diarrhea and nausea.  Genitourinary:  Negative for frequency and hematuria.  Musculoskeletal:  Positive for gait problem. Negative for back pain, joint swelling and neck pain.  Skin:  Negative for rash.  Neurological:  Negative for dizziness, tremors, speech difficulty and weakness.  Psychiatric/Behavioral:  Positive for decreased concentration and dysphoric mood. Negative for agitation, sleep disturbance and suicidal ideas. The patient is not nervous/anxious.    Objective:  BP 132/70 (BP Location: Left Arm)    Pulse 77    Temp 98.2 F (36.8 C) (Oral)    Ht 6\' 2"  (1.88 m)    Wt 178 lb (80.7 kg)    SpO2 96%    BMI 22.85 kg/m   BP Readings from Last 3 Encounters:  06/05/21 132/70  04/24/21 112/62  02/27/21 (!) 143/95    [facility]  Readings from Last 3 Encounters:  06/05/21 178 lb (80.7 kg)  04/24/21 178 lb 9.6 oz (81 kg)  02/23/21 183 lb 6.8 oz (83.2 kg)    Physical Exam Constitutional:      General: He is not in acute distress.    Appearance: He is well-developed.     Comments: NAD  Eyes:     Conjunctiva/sclera: Conjunctivae normal.     Pupils: Pupils are equal, round, and reactive to light.  Neck:     Thyroid: No thyromegaly.     Vascular: No JVD.  Cardiovascular:     Rate and Rhythm: Normal rate and regular rhythm.     Heart sounds: Normal heart sounds. No murmur heard.   No friction rub. No gallop.  Pulmonary:     Effort: Pulmonary effort is normal. No respiratory distress.     Breath sounds: Normal breath sounds.  No wheezing or rales.  Chest:     Chest wall: No tenderness.  Abdominal:     General: Bowel sounds are normal. There is no distension.     Palpations: Abdomen is soft. There is no mass.     Tenderness: There is no abdominal tenderness. There is no guarding or rebound.  Musculoskeletal:        General: Tenderness present. Normal range of motion.     Cervical back: Normal range of motion.  Lymphadenopathy:     Cervical: No cervical adenopathy.  Skin:    General: Skin is warm and dry.     Findings: No rash.  Neurological:     Mental Status: He is alert and oriented to person, place, and time.     Cranial Nerves: No cranial nerve deficit.     Motor: Weakness present. No abnormal muscle tone.     Coordination: Coordination normal.     Gait: Gait abnormal.     Deep Tendon Reflexes: Reflexes are normal and symmetric.  Psychiatric:        Behavior: Behavior normal.        Thought Content: Thought content normal.        Judgment: Judgment normal.    Lab Results  Component Value Date   WBC 15.1 (H) 02/21/2021   HGB 13.3 02/21/2021   HCT 39.7 02/21/2021   PLT 302 02/21/2021   GLUCOSE 212 (H) 02/21/2021   CHOL 185 11/10/2019   TRIG 78 11/10/2019   HDL 34 (L) 11/10/2019   LDLDIRECT 105.0 05/31/2019   LDLCALC 135 (H) 11/10/2019   ALT 12 02/18/2021   AST 18 02/18/2021   NA 136 02/21/2021   K 4.2 02/21/2021   CL 97 (L) 02/21/2021   CREATININE 0.84 02/21/2021   BUN 20 02/21/2021   CO2 27 02/21/2021   TSH 1.14 01/16/2021   PSA 0.30 05/31/2019   INR 1.0 11/09/2019   HGBA1C 6.8 (H) 01/16/2021    DG Abd Portable 1V  Result Date: 02/18/2021 CLINICAL DATA:  Feeding tube placement. EXAM: PORTABLE ABDOMEN - 1 VIEW COMPARISON: CT abdomen and pelvis 07/20/2017. FINDINGS: Feeding tube tip is at the level of the gastric antrum. No dilated bowel loops are seen in the upper abdomen. Degenerative changes affect the spine. IMPRESSION: 1. Feeding tube tip is at the level of the gastric antrum.  Electronically Signed   By: Ronney Asters M.D.   On: 02/18/2021 16:24    Assessment & Plan:   Problem List Items Addressed This Visit     Canker sores oral    Triamc - paste prn  Prescribed Valtrex po prn      Falls frequently    Better Fell 3 wks ago last Better      Grief    A little better. Discussed Seeing a counselor Lorazepam - rare use      Hemiparesis affecting left side as late effect of cerebrovascular accident (CVA) ([place])    Continue with Plavix and blood pressure control      Hyperglycemia    On Metformin Check A1c       Weight loss    Pt's wt was up to 183 lbs - has a cold x 2 wks - lost wt again On Megace         No orders of the defined types were placed in this encounter.     Follow-up: Return in about 3 months (around 09/03/2021) for a follow-up visit.  [name], MD

## 2021-06-11 DIAGNOSIS — I2510 Atherosclerotic heart disease of native coronary artery without angina pectoris: Secondary | ICD-10-CM | POA: Diagnosis not present

## 2021-06-11 DIAGNOSIS — I10 Essential (primary) hypertension: Secondary | ICD-10-CM | POA: Diagnosis not present

## 2021-06-11 DIAGNOSIS — I69354 Hemiplegia and hemiparesis following cerebral infarction affecting left non-dominant side: Secondary | ICD-10-CM | POA: Diagnosis not present

## 2021-06-11 DIAGNOSIS — C0680 Malignant neoplasm of overlapping sites of unspecified parts of mouth: Secondary | ICD-10-CM | POA: Diagnosis not present

## 2021-06-11 DIAGNOSIS — M5416 Radiculopathy, lumbar region: Secondary | ICD-10-CM | POA: Diagnosis not present

## 2021-06-11 DIAGNOSIS — M722 Plantar fascial fibromatosis: Secondary | ICD-10-CM | POA: Diagnosis not present

## 2021-06-11 DIAGNOSIS — E1151 Type 2 diabetes mellitus with diabetic peripheral angiopathy without gangrene: Secondary | ICD-10-CM | POA: Diagnosis not present

## 2021-06-11 DIAGNOSIS — Z483 Aftercare following surgery for neoplasm: Secondary | ICD-10-CM | POA: Diagnosis not present

## 2021-06-11 DIAGNOSIS — C031 Malignant neoplasm of lower gum: Secondary | ICD-10-CM | POA: Diagnosis not present

## 2021-06-11 DIAGNOSIS — E1140 Type 2 diabetes mellitus with diabetic neuropathy, unspecified: Secondary | ICD-10-CM | POA: Diagnosis not present

## 2021-06-18 DIAGNOSIS — M722 Plantar fascial fibromatosis: Secondary | ICD-10-CM | POA: Diagnosis not present

## 2021-06-18 DIAGNOSIS — Z483 Aftercare following surgery for neoplasm: Secondary | ICD-10-CM | POA: Diagnosis not present

## 2021-06-18 DIAGNOSIS — I69354 Hemiplegia and hemiparesis following cerebral infarction affecting left non-dominant side: Secondary | ICD-10-CM | POA: Diagnosis not present

## 2021-06-18 DIAGNOSIS — M5416 Radiculopathy, lumbar region: Secondary | ICD-10-CM | POA: Diagnosis not present

## 2021-06-18 DIAGNOSIS — E1140 Type 2 diabetes mellitus with diabetic neuropathy, unspecified: Secondary | ICD-10-CM | POA: Diagnosis not present

## 2021-06-18 DIAGNOSIS — C031 Malignant neoplasm of lower gum: Secondary | ICD-10-CM | POA: Diagnosis not present

## 2021-06-18 DIAGNOSIS — E1151 Type 2 diabetes mellitus with diabetic peripheral angiopathy without gangrene: Secondary | ICD-10-CM | POA: Diagnosis not present

## 2021-06-18 DIAGNOSIS — I2510 Atherosclerotic heart disease of native coronary artery without angina pectoris: Secondary | ICD-10-CM | POA: Diagnosis not present

## 2021-06-18 DIAGNOSIS — I10 Essential (primary) hypertension: Secondary | ICD-10-CM | POA: Diagnosis not present

## 2021-06-18 DIAGNOSIS — C0680 Malignant neoplasm of overlapping sites of unspecified parts of mouth: Secondary | ICD-10-CM | POA: Diagnosis not present

## 2021-06-20 ENCOUNTER — Other Ambulatory Visit: Payer: Self-pay | Admitting: Internal Medicine

## 2021-06-25 DIAGNOSIS — I2510 Atherosclerotic heart disease of native coronary artery without angina pectoris: Secondary | ICD-10-CM | POA: Diagnosis not present

## 2021-06-25 DIAGNOSIS — Z483 Aftercare following surgery for neoplasm: Secondary | ICD-10-CM | POA: Diagnosis not present

## 2021-06-25 DIAGNOSIS — E1151 Type 2 diabetes mellitus with diabetic peripheral angiopathy without gangrene: Secondary | ICD-10-CM | POA: Diagnosis not present

## 2021-06-25 DIAGNOSIS — C031 Malignant neoplasm of lower gum: Secondary | ICD-10-CM | POA: Diagnosis not present

## 2021-06-25 DIAGNOSIS — M5416 Radiculopathy, lumbar region: Secondary | ICD-10-CM | POA: Diagnosis not present

## 2021-06-25 DIAGNOSIS — M722 Plantar fascial fibromatosis: Secondary | ICD-10-CM | POA: Diagnosis not present

## 2021-06-25 DIAGNOSIS — C0680 Malignant neoplasm of overlapping sites of unspecified parts of mouth: Secondary | ICD-10-CM | POA: Diagnosis not present

## 2021-06-25 DIAGNOSIS — E1140 Type 2 diabetes mellitus with diabetic neuropathy, unspecified: Secondary | ICD-10-CM | POA: Diagnosis not present

## 2021-06-25 DIAGNOSIS — I10 Essential (primary) hypertension: Secondary | ICD-10-CM | POA: Diagnosis not present

## 2021-06-25 DIAGNOSIS — I69354 Hemiplegia and hemiparesis following cerebral infarction affecting left non-dominant side: Secondary | ICD-10-CM | POA: Diagnosis not present

## 2021-06-30 ENCOUNTER — Other Ambulatory Visit: Payer: Self-pay

## 2021-06-30 ENCOUNTER — Telehealth: Payer: Self-pay | Admitting: Internal Medicine

## 2021-06-30 ENCOUNTER — Ambulatory Visit (INDEPENDENT_AMBULATORY_CARE_PROVIDER_SITE_OTHER): Payer: Medicare HMO | Admitting: Internal Medicine

## 2021-06-30 ENCOUNTER — Encounter: Payer: Self-pay | Admitting: Internal Medicine

## 2021-06-30 VITALS — BP 118/58 | HR 77 | Temp 98.5°F | Ht 74.0 in | Wt 182.2 lb

## 2021-06-30 DIAGNOSIS — S060X0A Concussion without loss of consciousness, initial encounter: Secondary | ICD-10-CM

## 2021-06-30 DIAGNOSIS — W1800XA Striking against unspecified object with subsequent fall, initial encounter: Secondary | ICD-10-CM | POA: Insufficient documentation

## 2021-06-30 DIAGNOSIS — S060XAA Concussion with loss of consciousness status unknown, initial encounter: Secondary | ICD-10-CM | POA: Insufficient documentation

## 2021-06-30 NOTE — Assessment & Plan Note (Signed)
S/p fall - last Thursday L orbit hematoma Mild confusion

## 2021-06-30 NOTE — Telephone Encounter (Signed)
Patient states he fell and hit is head on 1-12  Advised patient to go to the ed, patient stated he was not going  Offered to transfer pt to team health, patient declined stating he wanted an appt  Patient scheduled for an ov today @ 10:00

## 2021-06-30 NOTE — Patient Instructions (Signed)
Concussion, Adult A concussion is a brain injury from a hard, direct hit (trauma) to the head or body. This direct hit causes the brain to shake quickly back and forth inside the skull. This can damage brain cells and cause chemical changes in the brain. A concussion may also be known as a mild traumatic brain injury (TBI). Concussions are usually not life-threatening, but the effects of a concussion can be serious. If you have a concussion, you should be very careful to avoid having a second concussion. What are the causes? This condition is caused by: A direct hit to your head, such as: Running into another player during a game. Being hit in a fight. Hitting your head on a hard surface. Sudden movement of your body that causes your brain to move back and forth inside the skull, such as in a car crash. What are the signs or symptoms? The signs of a concussion can be hard to notice. Early on, they may be missed by you, family members, and health care providers. You may look fine on the outside but may act or feel differently. Every head injury is different. Symptoms are usually temporary but may last for days, weeks, or even months. Some symptoms appear right away, but other symptoms may not show up for hours or days. If your symptoms last longer than normal, you may have post-concussion syndrome. Physical symptoms Headaches. Dizziness and problems with coordination or balance. Sensitivity to light or noise. Nausea or vomiting. Tiredness (fatigue). Vision or hearing problems. Changes in eating or sleeping patterns. Seizure. Mental and emotional symptoms Irritability or mood changes. Memory problems. Trouble concentrating, organizing, or making decisions. Slowness in thinking, acting or reacting, speaking, or reading. Anxiety or depression. How is this diagnosed? This condition is diagnosed based on: Your symptoms. A description of your injury. You may also have tests,  including: Imaging tests, such as a CT scan or an MRI. Neuropsychological tests. These measure your thinking, understanding, learning, and remembering abilities. How is this treated? Treatment for this condition includes: Stopping sports or activity if you are injured. If you hit your head or show signs of concussion: Do not return to sports or activities the same day. Get checked by a health care provider before you return to your activities. Physical and mental rest and careful observation, usually at home. Gradually return to your normal activities. Medicines to help with symptoms such as headaches, nausea, or difficulty sleeping. Avoid taking opioid pain medicine while recovering from a concussion. Avoiding alcohol and drugs. These may slow your recovery and can put you at risk of further injury. Referral to a concussion clinic or rehabilitation center. Recovery from a concussion can take time. How fast you recover depends on many factors. Return to activities only when: Your symptoms are completely gone. Your health care provider says that it is safe. Follow these instructions at home: Activity Limit activities that require a lot of thought or concentration, such as: Doing homework or job-related work. Watching TV. Working on the computer or phone. Playing memory games and puzzles. Rest. Rest helps your brain heal. Make sure you: Get plenty of sleep. Most adults should get 7-9 hours of sleep each night. Rest during the day. Take naps or rest breaks when you feel tired. Avoid physical activity like exercise until your health care provider says it is safe. Stop any activity that worsens symptoms. Do not do high-risk activities that could cause a second concussion, such as riding a bike or playing sports.  Ask your health care provider when you can return to your normal activities, such as school, work, athletics, and driving. Your ability to react may be slower after a brain injury.  Never do these activities if you are dizzy. Your health care provider will likely give you a plan for gradually returning to activities. General instructions  Take over-the-counter and prescription medicines only as told by your health care provider. Some medicines, such as blood thinners (anticoagulants) and aspirin, may increase the risk for complications, such as bleeding. Do not drink alcohol until your health care provider says you can. Watch your symptoms and tell others around you to do the same. Complications sometimes occur after a concussion. Older adults with a brain injury may have a higher risk of serious complications. Tell your work Freight forwarder, teachers, Government social research officer, school counselor, coach, or Product/process development scientist about your injury, symptoms, and restrictions. Keep all follow-up visits as told by your health care provider. This is important. How is this prevented? Avoiding another brain injury is very important. In rare cases, another injury can lead to permanent brain damage, brain swelling, or death. The risk of this is greatest during the first 7-10 days after a head injury. Avoid injuries by: Stopping activities that could lead to a second concussion, such as contact or recreational sports, until your health care provider says it is okay. Taking these actions once you have returned to sports or activities: Avoiding plays or moves that can cause you to crash into another person. This is how most concussions occur. Following the rules and being respectful of other players. Do not engage in violent or illegal plays. Getting regular exercise that includes strength and balance training. Wearing a properly fitting helmet during sports, biking, or other activities. Helmets can help protect you from serious skull and brain injuries, but they may not protect you from a concussion. Even when wearing a helmet, you should avoid being hit in the head. Contact a health care provider if: Your  symptoms do not improve. You have new symptoms. You have another injury. Get help right away if: You have new or worsening physical symptoms, such as: A severe or worsening headache. Weakness or numbness in any part of your body, slurred speech, vision changes, or confusion. Your coordination gets worse. Vomiting repeatedly. You have a seizure. You have unusual behavior changes. You lose consciousness, are sleepier than normal, or are difficult to wake up. These symptoms may represent a serious problem that is an emergency. Do not wait to see if the symptoms will go away. Get medical help right away. Call your local emergency services (911 in the [place] Do not drive yourself to the hospital. Summary A concussion is a brain injury that results from a hard, direct hit (trauma) to your head or body. You may have imaging tests and neuropsychological tests to diagnose a concussion. Treatment for this condition includes physical and mental rest and careful observation. Ask your health care provider when you can return to your normal activities, such as school, work, athletics, and driving. Get help right away if you have a severe headache, weakness in any part of the body, seizures, behavior changes, changes in vision, or if you are confused or sleepier than normal. This information is not intended to replace advice given to you by your health care provider. Make sure you discuss any questions you have with your health care provider. Document Revised: 08/15/2020 Document Reviewed: 08/15/2020 Else[employer] Patient Education  [employer].

## 2021-06-30 NOTE — Telephone Encounter (Signed)
Noted../lmb 

## 2021-07-01 DIAGNOSIS — C031 Malignant neoplasm of lower gum: Secondary | ICD-10-CM | POA: Diagnosis not present

## 2021-07-01 DIAGNOSIS — E1151 Type 2 diabetes mellitus with diabetic peripheral angiopathy without gangrene: Secondary | ICD-10-CM | POA: Diagnosis not present

## 2021-07-01 DIAGNOSIS — M722 Plantar fascial fibromatosis: Secondary | ICD-10-CM | POA: Diagnosis not present

## 2021-07-01 DIAGNOSIS — I2510 Atherosclerotic heart disease of native coronary artery without angina pectoris: Secondary | ICD-10-CM | POA: Diagnosis not present

## 2021-07-01 DIAGNOSIS — I69354 Hemiplegia and hemiparesis following cerebral infarction affecting left non-dominant side: Secondary | ICD-10-CM | POA: Diagnosis not present

## 2021-07-01 DIAGNOSIS — C0680 Malignant neoplasm of overlapping sites of unspecified parts of mouth: Secondary | ICD-10-CM | POA: Diagnosis not present

## 2021-07-01 DIAGNOSIS — M5416 Radiculopathy, lumbar region: Secondary | ICD-10-CM | POA: Diagnosis not present

## 2021-07-01 DIAGNOSIS — E1140 Type 2 diabetes mellitus with diabetic neuropathy, unspecified: Secondary | ICD-10-CM | POA: Diagnosis not present

## 2021-07-01 DIAGNOSIS — I10 Essential (primary) hypertension: Secondary | ICD-10-CM | POA: Diagnosis not present

## 2021-07-01 DIAGNOSIS — Z483 Aftercare following surgery for neoplasm: Secondary | ICD-10-CM | POA: Diagnosis not present

## 2021-07-01 NOTE — Progress Notes (Signed)
Subjective:  Patient ID: Francis Sims., male    DOB: Sep 29, 1940  Age: 81 y.o. MRN: [id number]  CC: Fall (Pt states he fell on Thursday.. hit his head (L) eye bruise. Got worse on Saturday)   HPI Cortlan Dolin. presents for follow-up on Thursday last week.  He was playing bridge at the friend's house.  His friend's son was washing the driveway. Ed slipped on the driveway and fell face forward.  He denies loss of consciousness.  The son assisted him to he is car and let him drive home. Ed does not have a any recollection of driving home.  He denies double vision, neck pain, headache, nausea and vomiting.  He developed severe bruise and swelling of the left eye and cheek.  She did not take Lasix since Thursday.    Outpatient Medications Prior to Visit  Medication Sig Dispense Refill   amLODipine (NORVASC) 2.5 MG tablet TAKE 1 TABLET BY MOUTH EVERY DAY 90 tablet 2   cholecalciferol (VITAMIN D) 1000 UNITS tablet Take 1,000 Units by mouth daily.     clopidogrel (PLAVIX) 75 MG tablet TAKE 1 TABLET BY MOUTH EVERY DAY (Patient taking differently: Take 75 mg by mouth daily.) 90 tablet 3   docusate sodium (COLACE) 100 MG capsule Take 100 mg by mouth 2 (two) times daily.     hydroxypropyl methylcellulose / hypromellose (ISOPTO TEARS / GONIOVISC) 2.5 % ophthalmic solution Place 1 drop into both eyes 3 (three) times daily as needed for dry eyes.     hydrOXYzine (ATARAX/VISTARIL) 25 MG tablet TAKE 1 TABLET BY MOUTH AT BEDTIME AS NEEDED FOR ITCHING. (Patient taking differently: Take 25 mg by mouth in the morning.) 90 tablet 1   LORazepam (ATIVAN) 0.5 MG tablet Take 1 tablet (0.5 mg total) by mouth 2 (two) times daily as needed for anxiety. 60 tablet 2   losartan (COZAAR) 50 MG tablet TAKE 1 TABLET BY MOUTH DAILY 90 tablet 2   megestrol (MEGACE) 40 MG tablet TAKE 1 TABLET BY MOUTH EVERY DAY 90 tablet 0   metFORMIN (GLUCOPHAGE) 500 MG tablet TAKE 1 TABLET BY MOUTH EVERY DAY WITH BREAKFAST 90 tablet  0   Omega-3 Fatty Acids (FISH OIL) 1000 MG CAPS Take 1,000 mg by mouth 2 (two) times daily.     pantoprazole (PROTONIX) 40 MG tablet Take 1 tablet (40 mg total) by mouth daily. 90 tablet 3   tamsulosin (FLOMAX) 0.4 MG CAPS capsule TAKE 1 CAPSULE BY MOUTH EVERY DAY (Patient taking differently: Take 0.4 mg by mouth daily.) 90 capsule 3   valACYclovir (VALTREX) 500 MG tablet 1 po bid x 5 days prn cold sores, cancer sores 40 tablet 1   vitamin C (ASCORBIC ACID) 250 MG tablet Take 250 mg by mouth daily.     azithromycin (ZITHROMAX Z-PAK) 250 MG tablet As directed (Patient not taking: Reported on 06/30/2021) 6 tablet 0   No facility-administered medications prior to visit.    ROS: Review of Systems  Constitutional:  Negative for appetite change, fatigue and unexpected weight change.  HENT:  Negative for congestion, nosebleeds, sneezing, sore throat and trouble swallowing.   Eyes:  Negative for itching and visual disturbance.  Respiratory:  Negative for cough.   Cardiovascular:  Negative for chest pain, palpitations and leg swelling.  Gastrointestinal:  Negative for abdominal distention, blood in stool, diarrhea and nausea.  Genitourinary:  Negative for frequency and hematuria.  Musculoskeletal:  Positive for arthralgias. Negative for back pain, gait problem,  joint swelling and neck pain.  Skin:  Negative for rash.  Neurological:  Positive for weakness. Negative for dizziness, tremors, facial asymmetry, speech difficulty, light-headedness and headaches.  Psychiatric/Behavioral:  Positive for dysphoric mood. Negative for agitation, confusion, sleep disturbance and suicidal ideas. The patient is nervous/anxious.    Objective:  BP (!) 118/58 (BP Location: Left Arm)    Pulse 77    Temp 98.5 F (36.9 C) (Oral)    Ht 6\' 2"  (1.88 m)    Wt 182 lb 3.2 oz (82.6 kg)    SpO2 98%    BMI 23.39 kg/m   BP Readings from Last 3 Encounters:  06/30/21 (!) 118/58  06/05/21 132/70  04/24/21 112/62    Wt  Readings from Last 3 Encounters:  06/30/21 182 lb 3.2 oz (82.6 kg)  06/05/21 178 lb (80.7 kg)  04/24/21 178 lb 9.6 oz (81 kg)    Physical Exam Constitutional:      General: He is not in acute distress.    Appearance: He is well-developed.     Comments: NAD  Eyes:     Conjunctiva/sclera: Conjunctivae normal.     Pupils: Pupils are equal, round, and reactive to light.  Neck:     Thyroid: No thyromegaly.     Vascular: No JVD.  Cardiovascular:     Rate and Rhythm: Normal rate and regular rhythm.     Heart sounds: Normal heart sounds. No murmur heard.   No friction rub. No gallop.  Pulmonary:     Effort: Pulmonary effort is normal. No respiratory distress.     Breath sounds: Normal breath sounds. No wheezing or rales.  Chest:     Chest wall: No tenderness.  Abdominal:     General: Bowel sounds are normal. There is no distension.     Palpations: Abdomen is soft. There is no mass.     Tenderness: There is no abdominal tenderness. There is no guarding or rebound.  Musculoskeletal:        General: Tenderness and signs of injury present. Normal range of motion.     Cervical back: Normal range of motion.  Lymphadenopathy:     Cervical: No cervical adenopathy.  Skin:    General: Skin is warm and dry.     Findings: Bruising present. No rash.  Neurological:     Mental Status: He is alert and oriented to person, place, and time.     Cranial Nerves: No cranial nerve deficit.     Motor: Weakness present. No abnormal muscle tone.     Gait: Gait abnormal.     Deep Tendon Reflexes: Reflexes are normal and symmetric.  Psychiatric:        Behavior: Behavior normal.        Thought Content: Thought content normal.        Judgment: Judgment normal.  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 Neck is supple Bruise left eye socket and cheek  Lab Results  Component Value Date   WBC 15.1 (H) 02/21/2021   HGB 13.3 02/21/2021   HCT 39.7 02/21/2021   PLT 302 02/21/2021   GLUCOSE 88 06/05/2021   CHOL  185 11/10/2019   TRIG 78 11/10/2019   HDL 34 (L) 11/10/2019   LDLDIRECT 105.0 05/31/2019   LDLCALC 135 (H) 11/10/2019   ALT 12 06/05/2021   AST 15 06/05/2021   NA 137 06/05/2021   K 4.7 06/05/2021   CL 104 06/05/2021   CREATININE 0.97 06/05/2021   BUN 19 06/05/2021  CO2 26 06/05/2021   TSH 1.14 01/16/2021   PSA 0.30 05/31/2019   INR 1.0 11/09/2019   HGBA1C 6.2 06/05/2021    DG Abd Portable 1V  Result Date: 02/18/2021 CLINICAL DATA:  Feeding tube placement. EXAM: PORTABLE ABDOMEN - 1 VIEW COMPARISON: CT abdomen and pelvis 07/20/2017. FINDINGS: Feeding tube tip is at the level of the gastric antrum. No dilated bowel loops are seen in the upper abdomen. Degenerative changes affect the spine. IMPRESSION: 1. Feeding tube tip is at the level of the gastric antrum. Electronically Signed   By: Ronney Asters M.D.   On: 02/18/2021 16:24    Assessment & Plan:   Problem List Items Addressed This Visit     Concussion    S/p fall - last Thursday L orbit hematoma Mild confusion      Relevant Orders   CT CERVICAL SPINE WO CONTRAST   CT HEAD WO CONTRAST (5MM)   Fall against object - Primary    S/p fall - last Thursday L orbit hematoma Mild confusion      Relevant Orders   CT CERVICAL SPINE WO CONTRAST   CT HEAD WO CONTRAST (5MM)      No orders of the defined types were placed in this encounter.     Follow-up: Return in about 4 weeks (around 07/28/2021) for a follow-up visit.  [name], MD

## 2021-07-01 NOTE — Telephone Encounter (Signed)
The patient was seen today 

## 2021-07-03 ENCOUNTER — Ambulatory Visit (INDEPENDENT_AMBULATORY_CARE_PROVIDER_SITE_OTHER)
Admission: RE | Admit: 2021-07-03 | Discharge: 2021-07-03 | Disposition: A | Discharge status: Home / Self Care | Payer: Medicare HMO | Source: Ambulatory Visit | Attending: Internal Medicine | Admitting: Internal Medicine

## 2021-07-03 ENCOUNTER — Other Ambulatory Visit: Payer: Self-pay

## 2021-07-03 ENCOUNTER — Ambulatory Visit
Admission: RE | Admit: 2021-07-03 | Discharge: 2021-07-03 | Disposition: A | Discharge status: Home / Self Care | Payer: Medicare HMO | Source: Ambulatory Visit | Attending: Internal Medicine | Admitting: Internal Medicine

## 2021-07-03 DIAGNOSIS — M47812 Spondylosis without myelopathy or radiculopathy, cervical region: Secondary | ICD-10-CM | POA: Diagnosis not present

## 2021-07-03 DIAGNOSIS — W1800XA Striking against unspecified object with subsequent fall, initial encounter: Secondary | ICD-10-CM

## 2021-07-03 DIAGNOSIS — S060X0A Concussion without loss of consciousness, initial encounter: Secondary | ICD-10-CM

## 2021-07-03 DIAGNOSIS — M2578 Osteophyte, vertebrae: Secondary | ICD-10-CM | POA: Diagnosis not present

## 2021-07-03 DIAGNOSIS — S0990XA Unspecified injury of head, initial encounter: Secondary | ICD-10-CM | POA: Diagnosis not present

## 2021-07-07 ENCOUNTER — Other Ambulatory Visit: Payer: Self-pay | Admitting: Internal Medicine

## 2021-07-10 DIAGNOSIS — E1140 Type 2 diabetes mellitus with diabetic neuropathy, unspecified: Secondary | ICD-10-CM | POA: Diagnosis not present

## 2021-07-10 DIAGNOSIS — N529 Male erectile dysfunction, unspecified: Secondary | ICD-10-CM | POA: Diagnosis not present

## 2021-07-10 DIAGNOSIS — I10 Essential (primary) hypertension: Secondary | ICD-10-CM | POA: Diagnosis not present

## 2021-07-10 DIAGNOSIS — M5416 Radiculopathy, lumbar region: Secondary | ICD-10-CM | POA: Diagnosis not present

## 2021-07-10 DIAGNOSIS — C031 Malignant neoplasm of lower gum: Secondary | ICD-10-CM | POA: Diagnosis not present

## 2021-07-10 DIAGNOSIS — J310 Chronic rhinitis: Secondary | ICD-10-CM | POA: Diagnosis not present

## 2021-07-10 DIAGNOSIS — H43819 Vitreous degeneration, unspecified eye: Secondary | ICD-10-CM | POA: Diagnosis not present

## 2021-07-10 DIAGNOSIS — E785 Hyperlipidemia, unspecified: Secondary | ICD-10-CM | POA: Diagnosis not present

## 2021-07-10 DIAGNOSIS — K760 Fatty (change of) liver, not elsewhere classified: Secondary | ICD-10-CM | POA: Diagnosis not present

## 2021-07-10 DIAGNOSIS — I2510 Atherosclerotic heart disease of native coronary artery without angina pectoris: Secondary | ICD-10-CM | POA: Diagnosis not present

## 2021-07-10 DIAGNOSIS — H6980 Other specified disorders of Eustachian tube, unspecified ear: Secondary | ICD-10-CM | POA: Diagnosis not present

## 2021-07-10 DIAGNOSIS — N320 Bladder-neck obstruction: Secondary | ICD-10-CM | POA: Diagnosis not present

## 2021-07-10 DIAGNOSIS — R634 Abnormal weight loss: Secondary | ICD-10-CM | POA: Diagnosis not present

## 2021-07-10 DIAGNOSIS — M722 Plantar fascial fibromatosis: Secondary | ICD-10-CM | POA: Diagnosis not present

## 2021-07-10 DIAGNOSIS — R202 Paresthesia of skin: Secondary | ICD-10-CM | POA: Diagnosis not present

## 2021-07-10 DIAGNOSIS — C0680 Malignant neoplasm of overlapping sites of unspecified parts of mouth: Secondary | ICD-10-CM | POA: Diagnosis not present

## 2021-07-10 DIAGNOSIS — Z7982 Long term (current) use of aspirin: Secondary | ICD-10-CM | POA: Diagnosis not present

## 2021-07-10 DIAGNOSIS — M19079 Primary osteoarthritis, unspecified ankle and foot: Secondary | ICD-10-CM | POA: Diagnosis not present

## 2021-07-10 DIAGNOSIS — G4714 Hypersomnia due to medical condition: Secondary | ICD-10-CM | POA: Diagnosis not present

## 2021-07-10 DIAGNOSIS — I69354 Hemiplegia and hemiparesis following cerebral infarction affecting left non-dominant side: Secondary | ICD-10-CM | POA: Diagnosis not present

## 2021-07-10 DIAGNOSIS — E1151 Type 2 diabetes mellitus with diabetic peripheral angiopathy without gangrene: Secondary | ICD-10-CM | POA: Diagnosis not present

## 2021-07-10 DIAGNOSIS — F4323 Adjustment disorder with mixed anxiety and depressed mood: Secondary | ICD-10-CM | POA: Diagnosis not present

## 2021-07-10 DIAGNOSIS — Z483 Aftercare following surgery for neoplasm: Secondary | ICD-10-CM | POA: Diagnosis not present

## 2021-07-10 DIAGNOSIS — Z7902 Long term (current) use of antithrombotics/antiplatelets: Secondary | ICD-10-CM | POA: Diagnosis not present

## 2021-07-14 DIAGNOSIS — Z7982 Long term (current) use of aspirin: Secondary | ICD-10-CM

## 2021-07-14 DIAGNOSIS — Z483 Aftercare following surgery for neoplasm: Secondary | ICD-10-CM | POA: Diagnosis not present

## 2021-07-14 DIAGNOSIS — E1151 Type 2 diabetes mellitus with diabetic peripheral angiopathy without gangrene: Secondary | ICD-10-CM | POA: Diagnosis not present

## 2021-07-14 DIAGNOSIS — H43819 Vitreous degeneration, unspecified eye: Secondary | ICD-10-CM

## 2021-07-14 DIAGNOSIS — G4714 Hypersomnia due to medical condition: Secondary | ICD-10-CM

## 2021-07-14 DIAGNOSIS — I10 Essential (primary) hypertension: Secondary | ICD-10-CM | POA: Diagnosis not present

## 2021-07-14 DIAGNOSIS — R202 Paresthesia of skin: Secondary | ICD-10-CM

## 2021-07-14 DIAGNOSIS — N320 Bladder-neck obstruction: Secondary | ICD-10-CM

## 2021-07-14 DIAGNOSIS — I69354 Hemiplegia and hemiparesis following cerebral infarction affecting left non-dominant side: Secondary | ICD-10-CM | POA: Diagnosis not present

## 2021-07-14 DIAGNOSIS — R634 Abnormal weight loss: Secondary | ICD-10-CM

## 2021-07-14 DIAGNOSIS — C0680 Malignant neoplasm of overlapping sites of unspecified parts of mouth: Secondary | ICD-10-CM | POA: Diagnosis not present

## 2021-07-14 DIAGNOSIS — E785 Hyperlipidemia, unspecified: Secondary | ICD-10-CM

## 2021-07-14 DIAGNOSIS — K760 Fatty (change of) liver, not elsewhere classified: Secondary | ICD-10-CM

## 2021-07-14 DIAGNOSIS — H6980 Other specified disorders of Eustachian tube, unspecified ear: Secondary | ICD-10-CM

## 2021-07-14 DIAGNOSIS — M19079 Primary osteoarthritis, unspecified ankle and foot: Secondary | ICD-10-CM

## 2021-07-14 DIAGNOSIS — I2510 Atherosclerotic heart disease of native coronary artery without angina pectoris: Secondary | ICD-10-CM | POA: Diagnosis not present

## 2021-07-14 DIAGNOSIS — N529 Male erectile dysfunction, unspecified: Secondary | ICD-10-CM

## 2021-07-14 DIAGNOSIS — M722 Plantar fascial fibromatosis: Secondary | ICD-10-CM | POA: Diagnosis not present

## 2021-07-14 DIAGNOSIS — Z7902 Long term (current) use of antithrombotics/antiplatelets: Secondary | ICD-10-CM

## 2021-07-14 DIAGNOSIS — Z87891 Personal history of nicotine dependence: Secondary | ICD-10-CM

## 2021-07-14 DIAGNOSIS — Z87820 Personal history of traumatic brain injury: Secondary | ICD-10-CM

## 2021-07-14 DIAGNOSIS — Z87440 Personal history of urinary (tract) infections: Secondary | ICD-10-CM

## 2021-07-14 DIAGNOSIS — F4323 Adjustment disorder with mixed anxiety and depressed mood: Secondary | ICD-10-CM

## 2021-07-14 DIAGNOSIS — C031 Malignant neoplasm of lower gum: Secondary | ICD-10-CM | POA: Diagnosis not present

## 2021-07-14 DIAGNOSIS — E1140 Type 2 diabetes mellitus with diabetic neuropathy, unspecified: Secondary | ICD-10-CM | POA: Diagnosis not present

## 2021-07-14 DIAGNOSIS — M5415 Radiculopathy, thoracolumbar region: Secondary | ICD-10-CM | POA: Diagnosis not present

## 2021-07-14 DIAGNOSIS — Z6822 Body mass index (BMI) 22.0-22.9, adult: Secondary | ICD-10-CM

## 2021-07-14 DIAGNOSIS — J310 Chronic rhinitis: Secondary | ICD-10-CM

## 2021-07-16 DIAGNOSIS — J310 Chronic rhinitis: Secondary | ICD-10-CM | POA: Diagnosis not present

## 2021-07-16 DIAGNOSIS — Z483 Aftercare following surgery for neoplasm: Secondary | ICD-10-CM | POA: Diagnosis not present

## 2021-07-16 DIAGNOSIS — E1140 Type 2 diabetes mellitus with diabetic neuropathy, unspecified: Secondary | ICD-10-CM | POA: Diagnosis not present

## 2021-07-16 DIAGNOSIS — C0680 Malignant neoplasm of overlapping sites of unspecified parts of mouth: Secondary | ICD-10-CM | POA: Diagnosis not present

## 2021-07-16 DIAGNOSIS — F4323 Adjustment disorder with mixed anxiety and depressed mood: Secondary | ICD-10-CM | POA: Diagnosis not present

## 2021-07-16 DIAGNOSIS — I69354 Hemiplegia and hemiparesis following cerebral infarction affecting left non-dominant side: Secondary | ICD-10-CM | POA: Diagnosis not present

## 2021-07-16 DIAGNOSIS — Z7902 Long term (current) use of antithrombotics/antiplatelets: Secondary | ICD-10-CM | POA: Diagnosis not present

## 2021-07-16 DIAGNOSIS — M722 Plantar fascial fibromatosis: Secondary | ICD-10-CM | POA: Diagnosis not present

## 2021-07-16 DIAGNOSIS — G4714 Hypersomnia due to medical condition: Secondary | ICD-10-CM | POA: Diagnosis not present

## 2021-07-16 DIAGNOSIS — H6980 Other specified disorders of Eustachian tube, unspecified ear: Secondary | ICD-10-CM | POA: Diagnosis not present

## 2021-07-16 DIAGNOSIS — I10 Essential (primary) hypertension: Secondary | ICD-10-CM | POA: Diagnosis not present

## 2021-07-16 DIAGNOSIS — E785 Hyperlipidemia, unspecified: Secondary | ICD-10-CM | POA: Diagnosis not present

## 2021-07-16 DIAGNOSIS — R634 Abnormal weight loss: Secondary | ICD-10-CM | POA: Diagnosis not present

## 2021-07-16 DIAGNOSIS — C031 Malignant neoplasm of lower gum: Secondary | ICD-10-CM | POA: Diagnosis not present

## 2021-07-16 DIAGNOSIS — I2510 Atherosclerotic heart disease of native coronary artery without angina pectoris: Secondary | ICD-10-CM | POA: Diagnosis not present

## 2021-07-16 DIAGNOSIS — M5416 Radiculopathy, lumbar region: Secondary | ICD-10-CM | POA: Diagnosis not present

## 2021-07-16 DIAGNOSIS — E1151 Type 2 diabetes mellitus with diabetic peripheral angiopathy without gangrene: Secondary | ICD-10-CM | POA: Diagnosis not present

## 2021-07-16 DIAGNOSIS — M19079 Primary osteoarthritis, unspecified ankle and foot: Secondary | ICD-10-CM | POA: Diagnosis not present

## 2021-07-16 DIAGNOSIS — K760 Fatty (change of) liver, not elsewhere classified: Secondary | ICD-10-CM | POA: Diagnosis not present

## 2021-07-16 DIAGNOSIS — N320 Bladder-neck obstruction: Secondary | ICD-10-CM | POA: Diagnosis not present

## 2021-07-16 DIAGNOSIS — R202 Paresthesia of skin: Secondary | ICD-10-CM | POA: Diagnosis not present

## 2021-07-16 DIAGNOSIS — N529 Male erectile dysfunction, unspecified: Secondary | ICD-10-CM | POA: Diagnosis not present

## 2021-07-16 DIAGNOSIS — Z7982 Long term (current) use of aspirin: Secondary | ICD-10-CM | POA: Diagnosis not present

## 2021-07-16 DIAGNOSIS — H43819 Vitreous degeneration, unspecified eye: Secondary | ICD-10-CM | POA: Diagnosis not present

## 2021-07-23 DIAGNOSIS — Z483 Aftercare following surgery for neoplasm: Secondary | ICD-10-CM | POA: Diagnosis not present

## 2021-07-23 DIAGNOSIS — R202 Paresthesia of skin: Secondary | ICD-10-CM | POA: Diagnosis not present

## 2021-07-23 DIAGNOSIS — C0680 Malignant neoplasm of overlapping sites of unspecified parts of mouth: Secondary | ICD-10-CM | POA: Diagnosis not present

## 2021-07-23 DIAGNOSIS — R634 Abnormal weight loss: Secondary | ICD-10-CM | POA: Diagnosis not present

## 2021-07-23 DIAGNOSIS — H6980 Other specified disorders of Eustachian tube, unspecified ear: Secondary | ICD-10-CM | POA: Diagnosis not present

## 2021-07-23 DIAGNOSIS — N529 Male erectile dysfunction, unspecified: Secondary | ICD-10-CM | POA: Diagnosis not present

## 2021-07-23 DIAGNOSIS — I69354 Hemiplegia and hemiparesis following cerebral infarction affecting left non-dominant side: Secondary | ICD-10-CM | POA: Diagnosis not present

## 2021-07-23 DIAGNOSIS — I2510 Atherosclerotic heart disease of native coronary artery without angina pectoris: Secondary | ICD-10-CM | POA: Diagnosis not present

## 2021-07-23 DIAGNOSIS — E1140 Type 2 diabetes mellitus with diabetic neuropathy, unspecified: Secondary | ICD-10-CM | POA: Diagnosis not present

## 2021-07-23 DIAGNOSIS — Z7982 Long term (current) use of aspirin: Secondary | ICD-10-CM | POA: Diagnosis not present

## 2021-07-23 DIAGNOSIS — H43819 Vitreous degeneration, unspecified eye: Secondary | ICD-10-CM | POA: Diagnosis not present

## 2021-07-23 DIAGNOSIS — N320 Bladder-neck obstruction: Secondary | ICD-10-CM | POA: Diagnosis not present

## 2021-07-23 DIAGNOSIS — F4323 Adjustment disorder with mixed anxiety and depressed mood: Secondary | ICD-10-CM | POA: Diagnosis not present

## 2021-07-23 DIAGNOSIS — I10 Essential (primary) hypertension: Secondary | ICD-10-CM | POA: Diagnosis not present

## 2021-07-23 DIAGNOSIS — Z7902 Long term (current) use of antithrombotics/antiplatelets: Secondary | ICD-10-CM | POA: Diagnosis not present

## 2021-07-23 DIAGNOSIS — M722 Plantar fascial fibromatosis: Secondary | ICD-10-CM | POA: Diagnosis not present

## 2021-07-23 DIAGNOSIS — K760 Fatty (change of) liver, not elsewhere classified: Secondary | ICD-10-CM | POA: Diagnosis not present

## 2021-07-23 DIAGNOSIS — J310 Chronic rhinitis: Secondary | ICD-10-CM | POA: Diagnosis not present

## 2021-07-23 DIAGNOSIS — M5416 Radiculopathy, lumbar region: Secondary | ICD-10-CM | POA: Diagnosis not present

## 2021-07-23 DIAGNOSIS — E1151 Type 2 diabetes mellitus with diabetic peripheral angiopathy without gangrene: Secondary | ICD-10-CM | POA: Diagnosis not present

## 2021-07-23 DIAGNOSIS — E785 Hyperlipidemia, unspecified: Secondary | ICD-10-CM | POA: Diagnosis not present

## 2021-07-23 DIAGNOSIS — M19079 Primary osteoarthritis, unspecified ankle and foot: Secondary | ICD-10-CM | POA: Diagnosis not present

## 2021-07-23 DIAGNOSIS — C031 Malignant neoplasm of lower gum: Secondary | ICD-10-CM | POA: Diagnosis not present

## 2021-07-23 DIAGNOSIS — G4714 Hypersomnia due to medical condition: Secondary | ICD-10-CM | POA: Diagnosis not present

## 2021-07-29 ENCOUNTER — Encounter: Payer: Self-pay | Admitting: Internal Medicine

## 2021-07-29 ENCOUNTER — Ambulatory Visit (INDEPENDENT_AMBULATORY_CARE_PROVIDER_SITE_OTHER): Payer: Medicare HMO | Admitting: Internal Medicine

## 2021-07-29 ENCOUNTER — Other Ambulatory Visit: Payer: Self-pay

## 2021-07-29 DIAGNOSIS — J019 Acute sinusitis, unspecified: Secondary | ICD-10-CM | POA: Insufficient documentation

## 2021-07-29 DIAGNOSIS — S060X0A Concussion without loss of consciousness, initial encounter: Secondary | ICD-10-CM | POA: Diagnosis not present

## 2021-07-29 DIAGNOSIS — K120 Recurrent oral aphthae: Secondary | ICD-10-CM | POA: Diagnosis not present

## 2021-07-29 DIAGNOSIS — I69354 Hemiplegia and hemiparesis following cerebral infarction affecting left non-dominant side: Secondary | ICD-10-CM | POA: Diagnosis not present

## 2021-07-29 DIAGNOSIS — J0191 Acute recurrent sinusitis, unspecified: Secondary | ICD-10-CM | POA: Diagnosis not present

## 2021-07-29 DIAGNOSIS — R69 Illness, unspecified: Secondary | ICD-10-CM | POA: Diagnosis not present

## 2021-07-29 DIAGNOSIS — R739 Hyperglycemia, unspecified: Secondary | ICD-10-CM | POA: Diagnosis not present

## 2021-07-29 DIAGNOSIS — R296 Repeated falls: Secondary | ICD-10-CM

## 2021-07-29 DIAGNOSIS — F4321 Adjustment disorder with depressed mood: Secondary | ICD-10-CM

## 2021-07-29 MED ORDER — VALACYCLOVIR HCL 500 MG PO TABS: ORAL_TABLET | ORAL | 5 refills | Status: DC

## 2021-07-29 MED ORDER — METFORMIN HCL 500 MG PO TABS: ORAL_TABLET | ORAL | 3 refills | Status: DC

## 2021-07-29 MED ORDER — CEFUROXIME AXETIL 250 MG PO TABS: 250.0000 mg | ORAL_TABLET | Freq: Two times a day (BID) | ORAL | 0 refills | Status: AC

## 2021-07-29 NOTE — Progress Notes (Signed)
Subjective:  Patient ID: Francis Ade., male    DOB: 04/29/1941  Age: 81 y.o. MRN: [id number]  CC: No chief complaint on file.   HPI Francis Ade. presents for canker sores - relapsed off Valtrex F/u on HTN, DM, CVA  C/o sinusitis sx's  Outpatient Medications Prior to Visit  Medication Sig Dispense Refill   amLODipine (NORVASC) 2.5 MG tablet TAKE 1 TABLET BY MOUTH EVERY DAY 90 tablet 2   cholecalciferol (VITAMIN D) 1000 UNITS tablet Take 1,000 Units by mouth daily.     clopidogrel (PLAVIX) 75 MG tablet TAKE 1 TABLET BY MOUTH EVERY DAY (Patient taking differently: Take 75 mg by mouth daily.) 90 tablet 3   docusate sodium (COLACE) 100 MG capsule Take 100 mg by mouth 2 (two) times daily.     hydroxypropyl methylcellulose / hypromellose (ISOPTO TEARS / GONIOVISC) 2.5 % ophthalmic solution Place 1 drop into both eyes 3 (three) times daily as needed for dry eyes.     hydrOXYzine (ATARAX/VISTARIL) 25 MG tablet TAKE 1 TABLET BY MOUTH AT BEDTIME AS NEEDED FOR ITCHING. (Patient taking differently: Take 25 mg by mouth in the morning.) 90 tablet 1   LORazepam (ATIVAN) 0.5 MG tablet Take 1 tablet (0.5 mg total) by mouth 2 (two) times daily as needed for anxiety. 60 tablet 2   losartan (COZAAR) 50 MG tablet TAKE 1 TABLET BY MOUTH DAILY 90 tablet 2   megestrol (MEGACE) 40 MG tablet TAKE 1 TABLET BY MOUTH EVERY DAY 90 tablet 1   Omega-3 Fatty Acids (FISH OIL) 1000 MG CAPS Take 1,000 mg by mouth 2 (two) times daily.     pantoprazole (PROTONIX) 40 MG tablet Take 1 tablet (40 mg total) by mouth daily. 90 tablet 3   tamsulosin (FLOMAX) 0.4 MG CAPS capsule TAKE 1 CAPSULE BY MOUTH EVERY DAY (Patient taking differently: Take 0.4 mg by mouth daily.) 90 capsule 3   vitamin C (ASCORBIC ACID) 250 MG tablet Take 250 mg by mouth daily.     metFORMIN (GLUCOPHAGE) 500 MG tablet TAKE 1 TABLET BY MOUTH EVERY DAY WITH BREAKFAST 90 tablet 0   valACYclovir (VALTREX) 500 MG tablet 1 po bid x 5 days prn cold  sores, cancer sores 40 tablet 1   No facility-administered medications prior to visit.    ROS: Review of Systems  Constitutional:  Positive for fatigue. Negative for appetite change and unexpected weight change.  HENT:  Positive for congestion, postnasal drip, rhinorrhea, sinus pressure and sinus pain. Negative for nosebleeds, sneezing, sore throat and trouble swallowing.   Eyes:  Negative for itching and visual disturbance.  Respiratory:  Negative for cough.   Cardiovascular:  Negative for chest pain, palpitations and leg swelling.  Gastrointestinal:  Negative for abdominal distention, blood in stool, diarrhea and nausea.  Genitourinary:  Negative for frequency and hematuria.  Musculoskeletal:  Positive for gait problem. Negative for back pain, joint swelling and neck pain.  Skin:  Negative for rash.  Neurological:  Positive for weakness. Negative for dizziness, tremors, speech difficulty and headaches.  Psychiatric/Behavioral:  Negative for agitation, dysphoric mood, sleep disturbance and suicidal ideas. The patient is not nervous/anxious.    Objective:  BP 130/72 (BP Location: Left Arm, Patient Position: Sitting, Cuff Size: Large)    Pulse 78    Temp 97.9 F (36.6 C) (Oral)    Ht 6\' 2"  (1.88 m)    Wt 182 lb (82.6 kg)    SpO2 96%    BMI 23.37  kg/m   BP Readings from Last 3 Encounters:  07/29/21 130/72  06/30/21 (!) 118/58  06/05/21 132/70    Wt Readings from Last 3 Encounters:  07/29/21 182 lb (82.6 kg)  06/30/21 182 lb 3.2 oz (82.6 kg)  06/05/21 178 lb (80.7 kg)    Physical Exam Constitutional:      General: He is not in acute distress.    Appearance: Normal appearance. He is well-developed.     Comments: NAD  Eyes:     Conjunctiva/sclera: Conjunctivae normal.     Pupils: Pupils are equal, round, and reactive to light.  Neck:     Thyroid: No thyromegaly.     Vascular: No JVD.  Cardiovascular:     Rate and Rhythm: Normal rate and regular rhythm.     Heart sounds:  Normal heart sounds. No murmur heard.   No friction rub. No gallop.  Pulmonary:     Effort: Pulmonary effort is normal. No respiratory distress.     Breath sounds: Normal breath sounds. No wheezing or rales.  Chest:     Chest wall: No tenderness.  Abdominal:     General: Bowel sounds are normal. There is no distension.     Palpations: Abdomen is soft. There is no mass.     Tenderness: There is no abdominal tenderness. There is no guarding or rebound.  Musculoskeletal:        General: Tenderness present. Normal range of motion.     Cervical back: Normal range of motion.  Lymphadenopathy:     Cervical: No cervical adenopathy.  Skin:    General: Skin is warm and dry.     Findings: No rash.  Neurological:     Mental Status: He is alert and oriented to person, place, and time.     Cranial Nerves: No cranial nerve deficit.     Motor: Weakness present. No abnormal muscle tone.     Coordination: Coordination abnormal.     Gait: Gait abnormal.     Deep Tendon Reflexes: Reflexes abnormal.  Psychiatric:        Behavior: Behavior normal.        Thought Content: Thought content normal.        Judgment: Judgment normal.    Lab Results  Component Value Date   WBC 15.1 (H) 02/21/2021   HGB 13.3 02/21/2021   HCT 39.7 02/21/2021   PLT 302 02/21/2021   GLUCOSE 88 06/05/2021   CHOL 185 11/10/2019   TRIG 78 11/10/2019   HDL 34 (L) 11/10/2019   LDLDIRECT 105.0 05/31/2019   LDLCALC 135 (H) 11/10/2019   ALT 12 06/05/2021   AST 15 06/05/2021   NA 137 06/05/2021   K 4.7 06/05/2021   CL 104 06/05/2021   CREATININE 0.97 06/05/2021   BUN 19 06/05/2021   CO2 26 06/05/2021   TSH 1.14 01/16/2021   PSA 0.30 05/31/2019   INR 1.0 11/09/2019   HGBA1C 6.2 06/05/2021    CT HEAD WO CONTRAST (5MM)  Result Date: 07/03/2021 CLINICAL DATA:  Provided history: Fall against object. Concussion without loss of consciousness, initial encounter. Head trauma, moderate/severe. Neck trauma. Additional history  provided: Patient reports slipping and falling in driveway last Thursday (hitting head). Bruising to left eye and cheek bone. Patient on Eliquis. EXAM: CT HEAD WITHOUT CONTRAST CT CERVICAL SPINE WITHOUT CONTRAST TECHNIQUE: Multidetector CT imaging of the head and cervical spine was performed following the standard protocol without intravenous contrast. Multiplanar CT image reconstructions of the cervical spine were  also generated. RADIATION DOSE REDUCTION: This exam was performed according to the departmental dose-optimization program which includes automated exposure control, adjustment of the mA and/or kV according to patient size and/or use of iterative reconstruction technique. COMPARISON:  Brain MRI 03/25/2020. Head CT 03/25/2020. CT cervical spine 04/10/2013. FINDINGS: CT HEAD FINDING: Brain: Mild generalized cerebral and cerebellar atrophy. Redemonstrated prominent perivascular spaces within the basal ganglia and left subinsular white matter. Mild patchy hypoattenuation within the cerebral white matter, nonspecific but compatible with chronic small vessel ischemic disease. There is no acute intracranial hemorrhage. No demarcated cortical infarct. No extra-axial fluid collection. No evidence of an intracranial mass. No midline shift. Vascular: No hyperdense vessel.  Atherosclerotic calcifications. Skull: Normal. Negative for fracture or focal lesion. Sinuses/Orbits: Visualized orbits show no acute finding. Mild mucosal thickening within the bilateral ethmoid sinuses. Small mucous retention cyst within the left sphenoid sinus. Minimal mucosal thickening within the partially imaged sorry sinuses. CT CERVICAL SPINE FINDINGS Alignment: 2 mm C4-C5 grade 1 anterolisthesis. Skull base and vertebrae: The basion-dental and atlanto-dental intervals are maintained.No evidence of acute fracture to the cervical spine. Soft tissues and spinal canal: No prevertebral fluid or swelling. No visible canal hematoma. Disc levels:  Redemonstrated sequela of prior C5-C7 ACDF. No evidence of acute hardware compromise. Cervical spondylosis with multilevel disc space narrowing, disc bulges/central disc protrusions, posterior disc osteophytes, endplate spurring, uncovertebral hypertrophy and facet arthrosis. No appreciable high-grade spinal canal stenosis. Multilevel bony neural foraminal narrowing. At C2-C3, disc degeneration is advanced and there is early osseous fusion across the disc space. Upper chest: No consolidation within the imaged lung apices. No visible pneumothorax. Other: Chronic, healed fracture deformity of the posterior right third rib. IMPRESSION: CT head: 1. No evidence of acute intracranial abnormality. 2. Mild chronic small vessel ischemic changes within the cerebral white matter. 3. Mild generalized parenchymal atrophy. 4. Mild paranasal sinus disease at the imaged levels, as described. CT cervical spine: 1. No evidence of acute fracture to the cervical spine. 2. 2 mm C4-C5 grade 1 anterolisthesis, unchanged from the prior CT of 04/10/2013. 3. Prior C5-C7 ACDF. No evidence of acute hardware compromise. 4. Cervical spondylosis, as described. Early degenerative fusion across the C2-C3 disc space. Electronically Signed   By: Kellie Simmering D.O.   On: 07/03/2021 10:44   CT CERVICAL SPINE WO CONTRAST  Result Date: 07/03/2021 CLINICAL DATA:  Provided history: Fall against object. Concussion without loss of consciousness, initial encounter. Head trauma, moderate/severe. Neck trauma. Additional history provided: Patient reports slipping and falling in driveway last Thursday (hitting head). Bruising to left eye and cheek bone. Patient on Eliquis. EXAM: CT HEAD WITHOUT CONTRAST CT CERVICAL SPINE WITHOUT CONTRAST TECHNIQUE: Multidetector CT imaging of the head and cervical spine was performed following the standard protocol without intravenous contrast. Multiplanar CT image reconstructions of the cervical spine were also generated.  RADIATION DOSE REDUCTION: This exam was performed according to the departmental dose-optimization program which includes automated exposure control, adjustment of the mA and/or kV according to patient size and/or use of iterative reconstruction technique. COMPARISON:  Brain MRI 03/25/2020. Head CT 03/25/2020. CT cervical spine 04/10/2013. FINDINGS: CT HEAD FINDING: Brain: Mild generalized cerebral and cerebellar atrophy. Redemonstrated prominent perivascular spaces within the basal ganglia and left subinsular white matter. Mild patchy hypoattenuation within the cerebral white matter, nonspecific but compatible with chronic small vessel ischemic disease. There is no acute intracranial hemorrhage. No demarcated cortical infarct. No extra-axial fluid collection. No evidence of an intracranial mass. No midline shift. Vascular: No  hyperdense vessel.  Atherosclerotic calcifications. Skull: Normal. Negative for fracture or focal lesion. Sinuses/Orbits: Visualized orbits show no acute finding. Mild mucosal thickening within the bilateral ethmoid sinuses. Small mucous retention cyst within the left sphenoid sinus. Minimal mucosal thickening within the partially imaged sorry sinuses. CT CERVICAL SPINE FINDINGS Alignment: 2 mm C4-C5 grade 1 anterolisthesis. Skull base and vertebrae: The basion-dental and atlanto-dental intervals are maintained.No evidence of acute fracture to the cervical spine. Soft tissues and spinal canal: No prevertebral fluid or swelling. No visible canal hematoma. Disc levels: Redemonstrated sequela of prior C5-C7 ACDF. No evidence of acute hardware compromise. Cervical spondylosis with multilevel disc space narrowing, disc bulges/central disc protrusions, posterior disc osteophytes, endplate spurring, uncovertebral hypertrophy and facet arthrosis. No appreciable high-grade spinal canal stenosis. Multilevel bony neural foraminal narrowing. At C2-C3, disc degeneration is advanced and there is early  osseous fusion across the disc space. Upper chest: No consolidation within the imaged lung apices. No visible pneumothorax. Other: Chronic, healed fracture deformity of the posterior right third rib. IMPRESSION: CT head: 1. No evidence of acute intracranial abnormality. 2. Mild chronic small vessel ischemic changes within the cerebral white matter. 3. Mild generalized parenchymal atrophy. 4. Mild paranasal sinus disease at the imaged levels, as described. CT cervical spine: 1. No evidence of acute fracture to the cervical spine. 2. 2 mm C4-C5 grade 1 anterolisthesis, unchanged from the prior CT of 04/10/2013. 3. Prior C5-C7 ACDF. No evidence of acute hardware compromise. 4. Cervical spondylosis, as described. Early degenerative fusion across the C2-C3 disc space. Electronically Signed   By: Kellie Simmering D.O.   On: 07/03/2021 10:44    Assessment & Plan:   Problem List Items Addressed This Visit     Acute sinusitis     Abn head  CT. C/o sx's Trial of Ceftin      Relevant Medications   valACYclovir (VALTREX) 500 MG tablet   cefUROXime (CEFTIN) 250 MG tablet   Canker sores oral     canker sores - relapsed off Valtrex - re-start      Concussion    Doing fair      Falls frequently    No relapse      Grief    Coping a little better      Hemiparesis affecting left side as late effect of cerebrovascular accident (CVA) ([place])    Continue with Plavix and blood pressure control      Hyperglycemia    On metformin         Meds ordered this encounter  Medications   valACYclovir (VALTREX) 500 MG tablet    Sig: 1 po bid for  cancer sores    Dispense:  60 tablet    Refill:  5   metFORMIN (GLUCOPHAGE) 500 MG tablet    Sig: TAKE 1 TABLET BY MOUTH EVERY DAY WITH BREAKFAST    Dispense:  90 tablet    Refill:  3   cefUROXime (CEFTIN) 250 MG tablet    Sig: Take 1 tablet (250 mg total) by mouth 2 (two) times daily with a meal for 10 days.    Dispense:  20 tablet    Refill:  0       Follow-up: Return in about 3 months (around 10/26/2021) for a follow-up visit.  [name], MD  Medical screening examination/treatment/procedure(s) were performed by non-physician practitioner and as supervising physician I was immediately available for consultation/collaboration.  I agree with above. Lew Dawes, MD

## 2021-07-29 NOTE — Assessment & Plan Note (Signed)
Coping a little better

## 2021-07-29 NOTE — Assessment & Plan Note (Signed)
No relapse 

## 2021-07-29 NOTE — Assessment & Plan Note (Signed)
canker sores - relapsed off Valtrex - re-start

## 2021-07-29 NOTE — Patient Instructions (Addendum)
Check out [employer] mini

## 2021-07-29 NOTE — Assessment & Plan Note (Signed)
On metformin

## 2021-07-29 NOTE — Assessment & Plan Note (Signed)
Abn head  CT. C/o sx's Trial of Ceftin

## 2021-07-29 NOTE — Assessment & Plan Note (Signed)
Doing fair 

## 2021-07-29 NOTE — Assessment & Plan Note (Signed)
Continue with Plavix and blood pressure control

## 2021-07-30 DIAGNOSIS — H6980 Other specified disorders of Eustachian tube, unspecified ear: Secondary | ICD-10-CM | POA: Diagnosis not present

## 2021-07-30 DIAGNOSIS — R202 Paresthesia of skin: Secondary | ICD-10-CM | POA: Diagnosis not present

## 2021-07-30 DIAGNOSIS — C031 Malignant neoplasm of lower gum: Secondary | ICD-10-CM | POA: Diagnosis not present

## 2021-07-30 DIAGNOSIS — E1151 Type 2 diabetes mellitus with diabetic peripheral angiopathy without gangrene: Secondary | ICD-10-CM | POA: Diagnosis not present

## 2021-07-30 DIAGNOSIS — I2510 Atherosclerotic heart disease of native coronary artery without angina pectoris: Secondary | ICD-10-CM | POA: Diagnosis not present

## 2021-07-30 DIAGNOSIS — Z7982 Long term (current) use of aspirin: Secondary | ICD-10-CM | POA: Diagnosis not present

## 2021-07-30 DIAGNOSIS — M19079 Primary osteoarthritis, unspecified ankle and foot: Secondary | ICD-10-CM | POA: Diagnosis not present

## 2021-07-30 DIAGNOSIS — I10 Essential (primary) hypertension: Secondary | ICD-10-CM | POA: Diagnosis not present

## 2021-07-30 DIAGNOSIS — J310 Chronic rhinitis: Secondary | ICD-10-CM | POA: Diagnosis not present

## 2021-07-30 DIAGNOSIS — N529 Male erectile dysfunction, unspecified: Secondary | ICD-10-CM | POA: Diagnosis not present

## 2021-07-30 DIAGNOSIS — I69354 Hemiplegia and hemiparesis following cerebral infarction affecting left non-dominant side: Secondary | ICD-10-CM | POA: Diagnosis not present

## 2021-07-30 DIAGNOSIS — E785 Hyperlipidemia, unspecified: Secondary | ICD-10-CM | POA: Diagnosis not present

## 2021-07-30 DIAGNOSIS — K760 Fatty (change of) liver, not elsewhere classified: Secondary | ICD-10-CM | POA: Diagnosis not present

## 2021-07-30 DIAGNOSIS — F4323 Adjustment disorder with mixed anxiety and depressed mood: Secondary | ICD-10-CM | POA: Diagnosis not present

## 2021-07-30 DIAGNOSIS — M5416 Radiculopathy, lumbar region: Secondary | ICD-10-CM | POA: Diagnosis not present

## 2021-07-30 DIAGNOSIS — H43819 Vitreous degeneration, unspecified eye: Secondary | ICD-10-CM | POA: Diagnosis not present

## 2021-07-30 DIAGNOSIS — C0680 Malignant neoplasm of overlapping sites of unspecified parts of mouth: Secondary | ICD-10-CM | POA: Diagnosis not present

## 2021-07-30 DIAGNOSIS — E1140 Type 2 diabetes mellitus with diabetic neuropathy, unspecified: Secondary | ICD-10-CM | POA: Diagnosis not present

## 2021-07-30 DIAGNOSIS — R634 Abnormal weight loss: Secondary | ICD-10-CM | POA: Diagnosis not present

## 2021-07-30 DIAGNOSIS — G4714 Hypersomnia due to medical condition: Secondary | ICD-10-CM | POA: Diagnosis not present

## 2021-07-30 DIAGNOSIS — N320 Bladder-neck obstruction: Secondary | ICD-10-CM | POA: Diagnosis not present

## 2021-07-30 DIAGNOSIS — Z483 Aftercare following surgery for neoplasm: Secondary | ICD-10-CM | POA: Diagnosis not present

## 2021-07-30 DIAGNOSIS — M722 Plantar fascial fibromatosis: Secondary | ICD-10-CM | POA: Diagnosis not present

## 2021-07-30 DIAGNOSIS — Z7902 Long term (current) use of antithrombotics/antiplatelets: Secondary | ICD-10-CM | POA: Diagnosis not present

## 2021-07-31 ENCOUNTER — Ambulatory Visit: Payer: Medicare HMO

## 2021-08-01 ENCOUNTER — Ambulatory Visit (HOSPITAL_COMMUNITY)
Admission: RE | Admit: 2021-08-01 | Discharge: 2021-08-01 | Disposition: A | Discharge status: Home / Self Care | Payer: Medicare HMO | Source: Ambulatory Visit | Attending: Otolaryngology | Admitting: Otolaryngology

## 2021-08-01 ENCOUNTER — Other Ambulatory Visit: Payer: Self-pay

## 2021-08-01 ENCOUNTER — Other Ambulatory Visit (HOSPITAL_COMMUNITY): Payer: Self-pay | Admitting: Otolaryngology

## 2021-08-01 DIAGNOSIS — C031 Malignant neoplasm of lower gum: Secondary | ICD-10-CM | POA: Insufficient documentation

## 2021-08-01 DIAGNOSIS — C411 Malignant neoplasm of mandible: Secondary | ICD-10-CM | POA: Diagnosis not present

## 2021-08-01 DIAGNOSIS — K029 Dental caries, unspecified: Secondary | ICD-10-CM | POA: Diagnosis not present

## 2021-08-05 DIAGNOSIS — H524 Presbyopia: Secondary | ICD-10-CM | POA: Diagnosis not present

## 2021-08-06 DIAGNOSIS — C031 Malignant neoplasm of lower gum: Secondary | ICD-10-CM | POA: Diagnosis not present

## 2021-08-06 DIAGNOSIS — C0680 Malignant neoplasm of overlapping sites of unspecified parts of mouth: Secondary | ICD-10-CM | POA: Diagnosis not present

## 2021-08-06 DIAGNOSIS — Z483 Aftercare following surgery for neoplasm: Secondary | ICD-10-CM | POA: Diagnosis not present

## 2021-08-06 DIAGNOSIS — M5416 Radiculopathy, lumbar region: Secondary | ICD-10-CM | POA: Diagnosis not present

## 2021-08-06 DIAGNOSIS — E1151 Type 2 diabetes mellitus with diabetic peripheral angiopathy without gangrene: Secondary | ICD-10-CM | POA: Diagnosis not present

## 2021-08-06 DIAGNOSIS — I10 Essential (primary) hypertension: Secondary | ICD-10-CM | POA: Diagnosis not present

## 2021-08-06 DIAGNOSIS — E1140 Type 2 diabetes mellitus with diabetic neuropathy, unspecified: Secondary | ICD-10-CM | POA: Diagnosis not present

## 2021-08-06 DIAGNOSIS — M722 Plantar fascial fibromatosis: Secondary | ICD-10-CM | POA: Diagnosis not present

## 2021-08-06 DIAGNOSIS — I2510 Atherosclerotic heart disease of native coronary artery without angina pectoris: Secondary | ICD-10-CM | POA: Diagnosis not present

## 2021-08-06 DIAGNOSIS — I69354 Hemiplegia and hemiparesis following cerebral infarction affecting left non-dominant side: Secondary | ICD-10-CM | POA: Diagnosis not present

## 2021-08-11 ENCOUNTER — Ambulatory Visit: Payer: Medicare HMO

## 2021-08-13 DIAGNOSIS — M722 Plantar fascial fibromatosis: Secondary | ICD-10-CM | POA: Diagnosis not present

## 2021-08-13 DIAGNOSIS — Z483 Aftercare following surgery for neoplasm: Secondary | ICD-10-CM | POA: Diagnosis not present

## 2021-08-13 DIAGNOSIS — I10 Essential (primary) hypertension: Secondary | ICD-10-CM | POA: Diagnosis not present

## 2021-08-13 DIAGNOSIS — I69354 Hemiplegia and hemiparesis following cerebral infarction affecting left non-dominant side: Secondary | ICD-10-CM | POA: Diagnosis not present

## 2021-08-13 DIAGNOSIS — E1140 Type 2 diabetes mellitus with diabetic neuropathy, unspecified: Secondary | ICD-10-CM | POA: Diagnosis not present

## 2021-08-13 DIAGNOSIS — I2510 Atherosclerotic heart disease of native coronary artery without angina pectoris: Secondary | ICD-10-CM | POA: Diagnosis not present

## 2021-08-13 DIAGNOSIS — C031 Malignant neoplasm of lower gum: Secondary | ICD-10-CM | POA: Diagnosis not present

## 2021-08-13 DIAGNOSIS — M5416 Radiculopathy, lumbar region: Secondary | ICD-10-CM | POA: Diagnosis not present

## 2021-08-13 DIAGNOSIS — C0680 Malignant neoplasm of overlapping sites of unspecified parts of mouth: Secondary | ICD-10-CM | POA: Diagnosis not present

## 2021-08-13 DIAGNOSIS — E1151 Type 2 diabetes mellitus with diabetic peripheral angiopathy without gangrene: Secondary | ICD-10-CM | POA: Diagnosis not present

## 2021-08-18 ENCOUNTER — Ambulatory Visit (INDEPENDENT_AMBULATORY_CARE_PROVIDER_SITE_OTHER): Payer: Medicare HMO

## 2021-08-18 DIAGNOSIS — Z0000 Encounter for general adult medical examination without abnormal findings: Secondary | ICD-10-CM

## 2021-08-18 NOTE — Progress Notes (Addendum)
I connected with Francis Sims today by telephone and verified that I am speaking with the correct person using two identifiers. Location patient: home Location provider: work Persons participating in the virtual visit: patient, provider.   I discussed the limitations, risks, security and privacy concerns of performing an evaluation and management service by telephone and the availability of in person appointments. I also discussed with the patient that there may be a patient responsible charge related to this service. The patient expressed understanding and verbally consented to this telephonic visit.    Interactive audio and video telecommunications were attempted between this provider and patient, however failed, due to patient having technical difficulties OR patient did not have access to video capability.  We continued and completed visit with audio only.  Some vital signs may be absent or patient reported.   Time Spent with patient on telephone encounter: 40 minutes  Subjective:   Francis Sims. is a 81 y.o. male who presents for Medicare Annual/Subsequent preventive examination.  Review of Systems     Cardiac Risk Factors include: advanced age (>36mn, >>70women);dyslipidemia;hypertension;male gender     Objective:    There were no vitals filed for this visit. There is no height or weight on file to calculate BMI.  Advanced Directives 08/18/2021 02/18/2021 02/12/2021 06/18/2020 03/25/2020 11/09/2019 09/03/2016  Does Patient Have a Medical Advance Directive? Yes Yes No Yes No No Yes  Type of Advance Directive Living will;Healthcare Power of [place] - - [place]  Does patient want to make changes to medical advance directive? No - Patient declined No - Patient declined - No - Patient declined - - -  Copy of [place] Chart? Yes - validated most recent copy  scanned in chart (See row information) Yes - validated most recent copy scanned in chart (See row information) - No - copy requested - - -  Would patient like information on creating a medical advance directive? - No - Patient declined Yes (MAU/Ambulatory/Procedural Areas - Information given) - No - Patient declined No - Patient declined -  Pre-existing out of facility DNR order (yellow form or pink MOST form) - - - - - - -    Current Medications (verified) Outpatient Encounter Medications as of 08/18/2021  Medication Sig   amLODipine (NORVASC) 2.5 MG tablet TAKE 1 TABLET BY MOUTH EVERY DAY   cholecalciferol (VITAMIN D) 1000 UNITS tablet Take 1,000 Units by mouth daily.   clopidogrel (PLAVIX) 75 MG tablet TAKE 1 TABLET BY MOUTH EVERY DAY (Patient taking differently: Take 75 mg by mouth daily.)   docusate sodium (COLACE) 100 MG capsule Take 100 mg by mouth 2 (two) times daily.   hydroxypropyl methylcellulose / hypromellose (ISOPTO TEARS / GONIOVISC) 2.5 % ophthalmic solution Place 1 drop into both eyes 3 (three) times daily as needed for dry eyes.   hydrOXYzine (ATARAX/VISTARIL) 25 MG tablet TAKE 1 TABLET BY MOUTH AT BEDTIME AS NEEDED FOR ITCHING. (Patient taking differently: Take 25 mg by mouth in the morning.)   LORazepam (ATIVAN) 0.5 MG tablet Take 1 tablet (0.5 mg total) by mouth 2 (two) times daily as needed for anxiety.   losartan (COZAAR) 50 MG tablet TAKE 1 TABLET BY MOUTH DAILY   megestrol (MEGACE) 40 MG tablet TAKE 1 TABLET BY MOUTH EVERY DAY   metFORMIN (GLUCOPHAGE) 500 MG tablet TAKE 1 TABLET BY MOUTH EVERY DAY WITH BREAKFAST  Omega-3 Fatty Acids (FISH OIL) 1000 MG CAPS Take 1,000 mg by mouth 2 (two) times daily.   pantoprazole (PROTONIX) 40 MG tablet Take 1 tablet (40 mg total) by mouth daily.   tamsulosin (FLOMAX) 0.4 MG CAPS capsule TAKE 1 CAPSULE BY MOUTH EVERY DAY (Patient taking differently: Take 0.4 mg by mouth daily.)   valACYclovir (VALTREX) 500 MG tablet 1 po bid for  cancer  sores   vitamin C (ASCORBIC ACID) 250 MG tablet Take 250 mg by mouth daily.   No facility-administered encounter medications on file as of 08/18/2021.    Allergies (verified) Penicillins, Sulfonamide derivatives, Metoprolol, Hydroxyzine, Ibuprofen, and Statins   History: Past Medical History:  Diagnosis Date   Burping    per pt lots of burping   Chronic back pain    pinched nerve;buldging disc   Dry skin    on elbows;uses vasaline and it goes away   GERD (gastroesophageal reflux disease)    OTC   Hemorrhoids    Hx of colonic polyps    23yr ago   Hypertension    Nocturia    PONV (postoperative nausea and vomiting)    [place] [place]   Past Surgical History:  Procedure Laterality Date   ALVEOLOPLASTY Left 02/14/2021   Procedure: EXCISION OF LEFT MANDIBULAR ALVEOLAR CANCER; INCLUDING SECOND MOLAR;  Surgeon: RIzora Gala MD;  [place] [facility]  Service: ENT;  Laterality: Left;   BACK SURGERY     BROW LIFT Right 07/12/2013   Procedure: CONTRACTURE  RELEASE ZPLASTY OF RIGHT EYE, PERIORBITAL AREA WITH REPAIR OF PTOSIS OF RIGHT EYE BROW;  Surgeon: CTheodoro Kos DO;  [place] [facility]  Service: Plastics;  Laterality: Right;   CATARACT EXTRACTION  2008   bilateral   CERVICAL FUSION  2000   C5,6,7   COLONOSCOPY     EYE SURGERY     HERNIA REPAIR  1949   LUMBAR LAMINECTOMY/DECOMPRESSION MICRODISCECTOMY  05/26/2011   Procedure: LUMBAR LAMINECTOMY/DECOMPRESSION MICRODISCECTOMY;  Surgeon: ROlga CoasterKritzer;  [facility];  Service: Neurosurgery;  Laterality: Right;  Right Lumbar three-four Microdiskectomy   TONSILLECTOMY     Family History  Problem Relation Age of Onset   COPD Other    Anesthesia problems Neg Hx    Hypotension Neg Hx    Malignant hyperthermia Neg Hx    Pseudochol deficiency Neg Hx    Social History   Socioeconomic History   Marital status: Widowed    Spouse name: Not on file   Number of children: Not on file   Years of  education: Not on file   Highest education level: Not on file  Occupational History   Occupation: retired CHydrologist Tobacco Use   Smoking status: Former   Smokeless tobacco: Never   Tobacco comments:    in the 60's quit  Substance and Sexual Activity   Alcohol use: No    Alcohol/week: 0.0 standard drinks   Drug use: No   Sexual activity: Yes  Other Topics Concern   Not on file  Social History Narrative   Regular  Exercise-yes: tennis, work outs   Social Determinants of HRadio broadcast assistantStrain: Low Risk    Difficulty of Paying Living Expenses: Not hard at all  Food Insecurity: No Food Insecurity   Worried About RCharity fundraiserin the Last Year: Never true   RArboriculturistin the Last Year: Never true  Transportation Needs: No Transportation Needs   Lack  of Transportation (Medical): No   Lack of Transportation (Non-Medical): No  Physical Activity: Sufficiently Active   Days of Exercise per Week: 5 days   Minutes of Exercise per Session: 30 min  Stress: No Stress Concern Present   Feeling of Stress : Not at all  Social Connections: Moderately Integrated   Frequency of Communication with Friends and Family: More than three times a week   Frequency of Social Gatherings with Friends and Family: Once a week   Attends Religious Services: More than 4 times per year   Active Member of [employer] or Organizations: Yes   Attends Archivist Meetings: More than 4 times per year   Marital Status: Widowed    Tobacco Counseling Counseling given: Not Answered Tobacco comments: in the 60's quit   Clinical Intake:  Pre-visit preparation completed: Yes  Pain : No/denies pain     Nutritional Risks: None Diabetes: No  How often do you need to have someone help you when you read instructions, pamphlets, or other written materials from your doctor or pharmacy?: 1 - Never What is the last grade level you completed in school?: Graduated from [employer]  Diabetic? no  Interpreter Needed?: No  Information entered by :: Lisette Abu, LPN   Activities of Daily Living In your present state of health, do you have any difficulty performing the following activities: 08/18/2021 02/18/2021  Hearing? Y Y  Comment - hearing aid  Vision? N N  Difficulty concentrating or making decisions? - N  Walking or climbing stairs? Y Y  Comment - -  Dressing or bathing? N N  Doing errands, shopping? N N  Preparing Food and eating ? N -  Using the Toilet? N -  In the past six months, have you accidently leaked urine? N -  Do you have problems with loss of bowel control? N -  Managing your Medications? N -  Managing your Finances? N -  Housekeeping or managing your Housekeeping? N -  Some recent data might be hidden    Patient Care Team: Plotnikov, Evie Lacks, MD as PCP - General Wynetta Emery Ursula Alert, MD (Gastroenterology) Karie Chimera, MD as Attending Physician (Neurosurgery) [place], [place] as Consulting Physician (Optometry) Izora Gala, MD as Consulting Physician (Otolaryngology)  Indicate any recent Medical Services you may have received from other than [facility] providers in the past year (date may be approximate).     Assessment:   This is a routine wellness examination for Joeziah.  Hearing/Vision screen Hearing Screening - Comments:: Patient has hearing difficulty and wears hearing aids. Vision Screening - Comments:: Patient does wear corrective lenses/contacts.   Eye exam done by: Eula Flax, OD.  Dietary issues and exercise activities discussed: Current Exercise Habits: Home exercise routine, Type of exercise: Other - see comments (recombent bike, arm exercises & physical therapy every week), Time (Minutes): 30, Frequency (Times/Week): 5, Weekly Exercise (Minutes/Week): 150, Intensity: Mild, Exercise limited by: orthopedic condition(s);Other - see comments (history of CVA)   Goals Addressed             This  Visit's Progress    Patient Stated       After having my stroke, I have gotten my left arm to work again and I would love to get the strength back in my left leg.      Depression Screen PHQ 2/9 Scores 08/18/2021 07/29/2021 07/17/2020 06/18/2020 01/16/2020 01/16/2020 01/16/2020  PHQ - 2 Score 0 0 0 0 0 0 0  PHQ- 9 Score - - 3 - 0 0 -    Fall Risk Fall Risk  08/18/2021 06/30/2021 06/18/2020 01/22/2020 01/16/2020  Falls in the past year? '1 1 1 1 1  '$ Number falls in past yr: '1 1 1 1 1  '$ Injury with Fall? 1 1 0 0 1  Comment - fell on thursday left eye is bruised & left side of face - - -  Risk for fall due to : History of fall(s);Impaired balance/gait;Impaired mobility History of fall(s);Impaired balance/gait Impaired balance/gait;History of fall(s) - -  Follow up Falls evaluation completed - Falls evaluation completed - -    FALL RISK PREVENTION PERTAINING TO THE HOME:  Any stairs in or around the home? Yes  If so, are there any without handrails? No  Home free of loose throw rugs in walkways, pet beds, electrical cords, etc? Yes  Adequate lighting in your home to reduce risk of falls? Yes   ASSISTIVE DEVICES UTILIZED TO PREVENT FALLS:  Life alert? No  Use of a cane, walker or w/c? Yes  Grab bars in the bathroom? Yes  Shower chair or bench in shower? Yes  Elevated toilet seat or a handicapped toilet? Yes   TIMED UP AND GO:  Was the test performed? No .  Length of time to ambulate 10 feet: n/a sec.   Gait slow and steady with assistive device  Cognitive Function: Normal cognitive status assessed by direct observation by this Nurse Health Advisor. No abnormalities found.          Immunizations Immunization History  Administered Date(s) Administered   Influenza, High Dose Seasonal PF 03/12/2018, 02/14/2020   PFIZER(Purple Top)SARS-COV-2 Vaccination 07/07/2019, 07/28/2019   Pneumococcal Conjugate-13 05/05/2013   Pneumococcal Polysaccharide-23 04/24/2008   Tdap 11/29/2012   Zoster, Live  11/28/2010    TDAP status: Up to date  Flu Vaccine status: Declined, Education has been provided regarding the importance of this vaccine but patient still declined. Advised may receive this vaccine at local pharmacy or Health Dept. Aware to provide a copy of the vaccination record if obtained from local pharmacy or Health Dept. Verbalized acceptance and understanding.  Pneumococcal vaccine status: Up to date  Covid-19 vaccine status: Completed vaccines  Qualifies for Shingles Vaccine? Yes   Zostavax completed Yes   Shingrix Completed?: No.    Education has been provided regarding the importance of this vaccine. Patient has been advised to call insurance company to determine out of pocket expense if they have not yet received this vaccine. Advised may also receive vaccine at local pharmacy or Health Dept. Verbalized acceptance and understanding.  Screening Tests Health Maintenance  Topic Date Due   Hepatitis C Screening  Never done   Zoster Vaccines- Shingrix (1 of 2) Never done   COVID-19 Vaccine (3 - Pfizer risk series) 08/25/2019   TETANUS/TDAP  11/30/2022   Pneumonia Vaccine 42+ Years old  Completed   HPV VACCINES  Aged Out   INFLUENZA VACCINE  Discontinued   COLONOSCOPY (Pts 45-46yr Insurance coverage will need to be confirmed)  Discontinued    Health Maintenance  Health Maintenance Due  Topic Date Due   Hepatitis C Screening  Never done   Zoster Vaccines- Shingrix (1 of 2) Never done   COVID-19 Vaccine (3 - Pfizer risk series) 08/25/2019    Colorectal cancer screening: No longer required.   Lung Cancer Screening: (Low Dose CT Chest recommended if Age 79-80years, 30 pack-year currently smoking OR have quit w/in 15years.) does not qualify.  Lung Cancer Screening Referral: no  Additional Screening:  Hepatitis C Screening: does qualify; Completed no  Vision Screening: Recommended annual ophthalmology exams for early detection of glaucoma and other disorders of the  eye. Is the patient up to date with their annual eye exam?  Yes  Who is the provider or what is the name of the office in which the patient attends annual eye exams? Eula Flax, OD. If pt is not established with a provider, would they like to be referred to a provider to establish care? No .   Dental Screening: Recommended annual dental exams for proper oral hygiene  Community Resource Referral / Chronic Care Management: CRR required this visit?  No   CCM required this visit?  No      Plan:     I have personally reviewed and noted the following in the patients chart:   Medical and social history Use of alcohol, tobacco or illicit drugs  Current medications and supplements including opioid prescriptions. Patient is not currently taking opioid prescriptions. Functional ability and status Nutritional status Physical activity Advanced directives List of other physicians Hospitalizations, surgeries, and ER visits in previous 12 months Vitals Screenings to include cognitive, depression, and falls Referrals and appointments  In addition, I have reviewed and discussed with patient certain preventive protocols, quality metrics, and best practice recommendations. A written personalized care plan for preventive services as well as general preventive health recommendations were provided to patient.     Sheral Flow, LPN   01/14/9561   Nurse Notes:  Patient is cogitatively intact. There were no vitals filed for this visit. There is no height or weight on file to calculate BMI.  Medical screening examination/treatment/procedure(s) were performed by non-physician practitioner and as supervising physician I was immediately available for consultation/collaboration.  I agree with above. Lew Dawes, MD

## 2021-08-18 NOTE — Patient Instructions (Signed)
Francis Sims , Thank you for taking time to come for your Medicare Wellness Visit. I appreciate your ongoing commitment to your health goals. Please review the following plan we discussed and let me know if I can assist you in the future.   Screening recommendations/referrals: Colonoscopy: discontinued Recommended yearly ophthalmology/optometry visit for glaucoma screening and checkup Recommended yearly dental visit for hygiene and checkup  Vaccinations: Influenza vaccine: declined Pneumococcal vaccine: 04/24/2008, 05/05/2013 Tdap vaccine: 11/29/2012; due every 10 years Shingles vaccine: declined   Covid-19: 07/07/2019, 07/28/2019; refused boosters  Advanced directives: Yes; Documents on file.  Conditions/risks identified: Yes; Per patient: After having my stroke, I have gotten my left arm to work again and I would love to get the strength back in my left leg.  Next appointment: Please schedule your next Medicare Wellness Visit with your Nurse Health Advisor in 1 year by calling [phone].  Preventive Care 79 Years and Older, Male Preventive care refers to lifestyle choices and visits with your health care provider that can promote health and wellness. What does preventive care include? A yearly physical exam. This is also called an annual well check. Dental exams once or twice a year. Routine eye exams. Ask your health care provider how often you should have your eyes checked. Personal lifestyle choices, including: Daily care of your teeth and gums. Regular physical activity. Eating a healthy diet. Avoiding tobacco and drug use. Limiting alcohol use. Practicing safe sex. Taking low doses of aspirin every day. Taking vitamin and mineral supplements as recommended by your health care provider. What happens during an annual well check? The services and screenings done by your health care provider during your annual well check will depend on your age, overall health, lifestyle risk  factors, and family history of disease. Counseling  Your health care provider may ask you questions about your: Alcohol use. Tobacco use. Drug use. Emotional well-being. Home and relationship well-being. Sexual activity. Eating habits. History of falls. Memory and ability to understand (cognition). Work and work Statistician. Screening  You may have the following tests or measurements: Height, weight, and BMI. Blood pressure. Lipid and cholesterol levels. These may be checked every 5 years, or more frequently if you are over 41 years old. Skin check. Lung cancer screening. You may have this screening every year starting at age 23 if you have a 30-pack-year history of smoking and currently smoke or have quit within the past 15 years. Fecal occult blood test (FOBT) of the stool. You may have this test every year starting at age 1. Flexible sigmoidoscopy or colonoscopy. You may have a sigmoidoscopy every 5 years or a colonoscopy every 10 years starting at age 93. Prostate cancer screening. Recommendations will vary depending on your family history and other risks. Hepatitis C blood test. Hepatitis B blood test. Sexually transmitted disease (STD) testing. Diabetes screening. This is done by checking your blood sugar (glucose) after you have not eaten for a while (fasting). You may have this done every 1-3 years. Abdominal aortic aneurysm (AAA) screening. You may need this if you are a current or former smoker. Osteoporosis. You may be screened starting at age 84 if you are at high risk. Talk with your health care provider about your test results, treatment options, and if necessary, the need for more tests. Vaccines  Your health care provider may recommend certain vaccines, such as: Influenza vaccine. This is recommended every year. Tetanus, diphtheria, and acellular pertussis (Tdap, Td) vaccine. You may need a Td booster every  10 years. Zoster vaccine. You may need this after age  54. Pneumococcal 13-valent conjugate (PCV13) vaccine. One dose is recommended after age 93. Pneumococcal polysaccharide (PPSV23) vaccine. One dose is recommended after age 38. Talk to your health care provider about which screenings and vaccines you need and how often you need them. This information is not intended to replace advice given to you by your health care provider. Make sure you discuss any questions you have with your health care provider. Document Released: 06/28/2015 Document Revised: 02/19/2016 Document Reviewed: 04/02/2015 Elsevier Interactive Patient Education  2017 [employer] Prevention in the Home Falls can cause injuries. They can happen to people of all ages. There are many things you can do to make your home safe and to help prevent falls. What can I do on the outside of my home? Regularly fix the edges of walkways and driveways and fix any cracks. Remove anything that might make you trip as you walk through a door, such as a raised step or threshold. Trim any bushes or trees on the path to your home. Use bright outdoor lighting. Clear any walking paths of anything that might make someone trip, such as rocks or tools. Regularly check to see if handrails are loose or broken. Make sure that both sides of any steps have handrails. Any raised decks and porches should have guardrails on the edges. Have any leaves, snow, or ice cleared regularly. Use sand or salt on walking paths during winter. Clean up any spills in your garage right away. This includes oil or grease spills. What can I do in the bathroom? Use night lights. Install grab bars by the toilet and in the tub and shower. Do not use towel bars as grab bars. Use non-skid mats or decals in the tub or shower. If you need to sit down in the shower, use a plastic, non-slip stool. Keep the floor dry. Clean up any water that spills on the floor as soon as it happens. Remove soap buildup in the tub or shower  regularly. Attach bath mats securely with double-sided non-slip rug tape. Do not have throw rugs and other things on the floor that can make you trip. What can I do in the bedroom? Use night lights. Make sure that you have a light by your bed that is easy to reach. Do not use any sheets or blankets that are too big for your bed. They should not hang down onto the floor. Have a firm chair that has side arms. You can use this for support while you get dressed. Do not have throw rugs and other things on the floor that can make you trip. What can I do in the kitchen? Clean up any spills right away. Avoid walking on wet floors. Keep items that you use a lot in easy-to-reach places. If you need to reach something above you, use a strong step stool that has a grab bar. Keep electrical cords out of the way. Do not use floor polish or wax that makes floors slippery. If you must use wax, use non-skid floor wax. Do not have throw rugs and other things on the floor that can make you trip. What can I do with my stairs? Do not leave any items on the stairs. Make sure that there are handrails on both sides of the stairs and use them. Fix handrails that are broken or loose. Make sure that handrails are as long as the stairways. Check any carpeting to make  sure that it is firmly attached to the stairs. Fix any carpet that is loose or worn. Avoid having throw rugs at the top or bottom of the stairs. If you do have throw rugs, attach them to the floor with carpet tape. Make sure that you have a light switch at the top of the stairs and the bottom of the stairs. If you do not have them, ask someone to add them for you. What else can I do to help prevent falls? Wear shoes that: Do not have high heels. Have rubber bottoms. Are comfortable and fit you well. Are closed at the toe. Do not wear sandals. If you use a stepladder: Make sure that it is fully opened. Do not climb a closed stepladder. Make sure that  both sides of the stepladder are locked into place. Ask someone to hold it for you, if possible. Clearly mark and make sure that you can see: Any grab bars or handrails. First and last steps. Where the edge of each step is. Use tools that help you move around (mobility aids) if they are needed. These include: Canes. Walkers. Scooters. Crutches. Turn on the lights when you go into a dark area. Replace any light bulbs as soon as they burn out. Set up your furniture so you have a clear path. Avoid moving your furniture around. If any of your floors are uneven, fix them. If there are any pets around you, be aware of where they are. Review your medicines with your doctor. Some medicines can make you feel dizzy. This can increase your chance of falling. Ask your doctor what other things that you can do to help prevent falls. This information is not intended to replace advice given to you by your health care provider. Make sure you discuss any questions you have with your health care provider. Document Released: 03/28/2009 Document Revised: 11/07/2015 Document Reviewed: 07/06/2014 Elsevier Interactive Patient Education  2017 [employer].

## 2021-08-19 DIAGNOSIS — K1379 Other lesions of oral mucosa: Secondary | ICD-10-CM | POA: Diagnosis not present

## 2021-08-20 DIAGNOSIS — C0680 Malignant neoplasm of overlapping sites of unspecified parts of mouth: Secondary | ICD-10-CM | POA: Diagnosis not present

## 2021-08-20 DIAGNOSIS — E1151 Type 2 diabetes mellitus with diabetic peripheral angiopathy without gangrene: Secondary | ICD-10-CM | POA: Diagnosis not present

## 2021-08-20 DIAGNOSIS — C031 Malignant neoplasm of lower gum: Secondary | ICD-10-CM | POA: Diagnosis not present

## 2021-08-20 DIAGNOSIS — E1140 Type 2 diabetes mellitus with diabetic neuropathy, unspecified: Secondary | ICD-10-CM | POA: Diagnosis not present

## 2021-08-20 DIAGNOSIS — I2510 Atherosclerotic heart disease of native coronary artery without angina pectoris: Secondary | ICD-10-CM | POA: Diagnosis not present

## 2021-08-20 DIAGNOSIS — M722 Plantar fascial fibromatosis: Secondary | ICD-10-CM | POA: Diagnosis not present

## 2021-08-20 DIAGNOSIS — I69354 Hemiplegia and hemiparesis following cerebral infarction affecting left non-dominant side: Secondary | ICD-10-CM | POA: Diagnosis not present

## 2021-08-20 DIAGNOSIS — Z483 Aftercare following surgery for neoplasm: Secondary | ICD-10-CM | POA: Diagnosis not present

## 2021-08-20 DIAGNOSIS — M5416 Radiculopathy, lumbar region: Secondary | ICD-10-CM | POA: Diagnosis not present

## 2021-08-20 DIAGNOSIS — I10 Essential (primary) hypertension: Secondary | ICD-10-CM | POA: Diagnosis not present

## 2021-08-25 DIAGNOSIS — I10 Essential (primary) hypertension: Secondary | ICD-10-CM | POA: Diagnosis not present

## 2021-08-25 DIAGNOSIS — I2510 Atherosclerotic heart disease of native coronary artery without angina pectoris: Secondary | ICD-10-CM | POA: Diagnosis not present

## 2021-08-25 DIAGNOSIS — I69354 Hemiplegia and hemiparesis following cerebral infarction affecting left non-dominant side: Secondary | ICD-10-CM | POA: Diagnosis not present

## 2021-08-25 DIAGNOSIS — C031 Malignant neoplasm of lower gum: Secondary | ICD-10-CM | POA: Diagnosis not present

## 2021-08-25 DIAGNOSIS — E1151 Type 2 diabetes mellitus with diabetic peripheral angiopathy without gangrene: Secondary | ICD-10-CM | POA: Diagnosis not present

## 2021-08-25 DIAGNOSIS — M722 Plantar fascial fibromatosis: Secondary | ICD-10-CM | POA: Diagnosis not present

## 2021-08-25 DIAGNOSIS — M5416 Radiculopathy, lumbar region: Secondary | ICD-10-CM | POA: Diagnosis not present

## 2021-08-25 DIAGNOSIS — C0680 Malignant neoplasm of overlapping sites of unspecified parts of mouth: Secondary | ICD-10-CM | POA: Diagnosis not present

## 2021-08-25 DIAGNOSIS — E1140 Type 2 diabetes mellitus with diabetic neuropathy, unspecified: Secondary | ICD-10-CM | POA: Diagnosis not present

## 2021-08-25 DIAGNOSIS — Z483 Aftercare following surgery for neoplasm: Secondary | ICD-10-CM | POA: Diagnosis not present

## 2021-08-26 DIAGNOSIS — K0510 Chronic gingivitis, plaque induced: Secondary | ICD-10-CM | POA: Diagnosis not present

## 2021-08-26 DIAGNOSIS — K1379 Other lesions of oral mucosa: Secondary | ICD-10-CM | POA: Diagnosis not present

## 2021-08-31 ENCOUNTER — Other Ambulatory Visit: Payer: Self-pay | Admitting: Internal Medicine

## 2021-09-02 ENCOUNTER — Other Ambulatory Visit: Payer: Self-pay | Admitting: Internal Medicine

## 2021-09-04 ENCOUNTER — Other Ambulatory Visit: Payer: Self-pay

## 2021-09-04 ENCOUNTER — Other Ambulatory Visit: Payer: Self-pay | Admitting: Internal Medicine

## 2021-09-04 ENCOUNTER — Ambulatory Visit (INDEPENDENT_AMBULATORY_CARE_PROVIDER_SITE_OTHER): Payer: Medicare HMO | Admitting: Internal Medicine

## 2021-09-04 ENCOUNTER — Encounter: Payer: Self-pay | Admitting: Internal Medicine

## 2021-09-04 VITALS — BP 137/70 | HR 73 | Temp 98.0°F | Ht 74.0 in | Wt 186.0 lb

## 2021-09-04 DIAGNOSIS — I10 Essential (primary) hypertension: Secondary | ICD-10-CM

## 2021-09-04 DIAGNOSIS — R55 Syncope and collapse: Secondary | ICD-10-CM

## 2021-09-04 DIAGNOSIS — S060X0A Concussion without loss of consciousness, initial encounter: Secondary | ICD-10-CM

## 2021-09-04 DIAGNOSIS — N320 Bladder-neck obstruction: Secondary | ICD-10-CM

## 2021-09-04 DIAGNOSIS — R739 Hyperglycemia, unspecified: Secondary | ICD-10-CM

## 2021-09-04 DIAGNOSIS — Z8673 Personal history of transient ischemic attack (TIA), and cerebral infarction without residual deficits: Secondary | ICD-10-CM | POA: Diagnosis not present

## 2021-09-04 DIAGNOSIS — R69 Illness, unspecified: Secondary | ICD-10-CM | POA: Diagnosis not present

## 2021-09-04 DIAGNOSIS — F4321 Adjustment disorder with depressed mood: Secondary | ICD-10-CM | POA: Diagnosis not present

## 2021-09-04 LAB — COMPREHENSIVE METABOLIC PANEL
ALT: 14 U/L (ref 0–53)
AST: 17 U/L (ref 0–37)
Albumin: 4.5 g/dL (ref 3.5–5.2)
Alkaline Phosphatase: 74 U/L (ref 39–117)
BUN: 21 mg/dL (ref 6–23)
CO2: 26 mEq/L (ref 19–32)
Calcium: 9.6 mg/dL (ref 8.4–10.5)
Chloride: 104 mEq/L (ref 96–112)
Creatinine, Ser: 1.03 mg/dL (ref 0.40–1.50)
GFR: 69.5 mL/min (ref >60.00)
Glucose, Bld: 107 mg/dL — ABNORMAL HIGH (ref 70–99)
Potassium: 4.4 mEq/L (ref 3.5–5.1)
Sodium: 137 mEq/L (ref 135–145)
Total Bilirubin: 0.7 mg/dL (ref 0.2–1.2)
Total Protein: 7.4 g/dL (ref 6.0–8.3)

## 2021-09-04 LAB — URINALYSIS
Bilirubin Urine: NEGATIVE
Hgb urine dipstick: NEGATIVE
Ketones, ur: NEGATIVE
Leukocytes,Ua: NEGATIVE
Nitrite: NEGATIVE
Specific Gravity, Urine: 1.025 (ref 1.000–1.030)
Total Protein, Urine: NEGATIVE
Urine Glucose: NEGATIVE
Urobilinogen, UA: 0.2 (ref 0.0–1.0)
pH: 5.5 (ref 5.0–8.0)

## 2021-09-04 LAB — HEMOGLOBIN A1C: Hgb A1c MFr Bld: 6.3 % (ref 4.6–6.5)

## 2021-09-04 MED ORDER — TOLTERODINE TARTRATE ER 4 MG PO CP24: 4.0000 mg | ORAL_CAPSULE | Freq: Every day | ORAL | 5 refills | Status: DC

## 2021-09-04 NOTE — Assessment & Plan Note (Signed)
We reduced Amlodipine dose due to swelling ?

## 2021-09-04 NOTE — Assessment & Plan Note (Signed)
Planning to move to [facility] apartments ?

## 2021-09-04 NOTE — Patient Instructions (Signed)
Stop Flomax ? ?Start Detrol LA if covered in 2-3 days ? ?AZO when you go out ?

## 2021-09-04 NOTE — Assessment & Plan Note (Signed)
Recovered  

## 2021-09-04 NOTE — Assessment & Plan Note (Signed)
Worse w/urge incontinence x 2 months ?Will stop Flomax ?Start Detrol LA if covered ?AZO when you go out ?

## 2021-09-04 NOTE — Progress Notes (Signed)
? ?Subjective:  ?Patient ID: Francis Sims., male    DOB: 06/07/1941  Age: 81 y.o. MRN: [id number] ? ?CC: No chief complaint on file. ? ? ?HPI ?Francis Sims. presents for grief, s/p CVA, mouth cancer, HTN, wt loss ?C/o urine incontinence ?Planning to move to [facility] apartments ? ?Outpatient Medications Prior to Visit  ?Medication Sig Dispense Refill  ? amLODipine (NORVASC) 2.5 MG tablet TAKE 1 TABLET BY MOUTH EVERY DAY 90 tablet 2  ? cholecalciferol (VITAMIN D) 1000 UNITS tablet Take 1,000 Units by mouth daily.    ? clopidogrel (PLAVIX) 75 MG tablet TAKE 1 TABLET BY MOUTH EVERY DAY 90 tablet 3  ? docusate sodium (COLACE) 100 MG capsule Take 100 mg by mouth 2 (two) times daily.    ? hydroxypropyl methylcellulose / hypromellose (ISOPTO TEARS / GONIOVISC) 2.5 % ophthalmic solution Place 1 drop into both eyes 3 (three) times daily as needed for dry eyes.    ? hydrOXYzine (ATARAX/VISTARIL) 25 MG tablet TAKE 1 TABLET BY MOUTH AT BEDTIME AS NEEDED FOR ITCHING. (Patient taking differently: Take 25 mg by mouth in the morning.) 90 tablet 1  ? LORazepam (ATIVAN) 0.5 MG tablet Take 1 tablet (0.5 mg total) by mouth 2 (two) times daily as needed for anxiety. 60 tablet 2  ? losartan (COZAAR) 50 MG tablet TAKE 1 TABLET BY MOUTH DAILY 90 tablet 2  ? megestrol (MEGACE) 40 MG tablet TAKE 1 TABLET BY MOUTH EVERY DAY 90 tablet 1  ? metFORMIN (GLUCOPHAGE) 500 MG tablet TAKE 1 TABLET BY MOUTH EVERY DAY WITH BREAKFAST 90 tablet 3  ? Omega-3 Fatty Acids (FISH OIL) 1000 MG CAPS Take 1,000 mg by mouth 2 (two) times daily.    ? pantoprazole (PROTONIX) 40 MG tablet Take 1 tablet (40 mg total) by mouth daily. 90 tablet 3  ? tamsulosin (FLOMAX) 0.4 MG CAPS capsule TAKE 1 CAPSULE BY MOUTH EVERY DAY (Patient taking differently: Take 0.4 mg by mouth daily.) 90 capsule 3  ? valACYclovir (VALTREX) 500 MG tablet 1 po bid for  cancer sores 60 tablet 5  ? vitamin C (ASCORBIC ACID) 250 MG tablet Take 250 mg by mouth daily.    ? ?No  facility-administered medications prior to visit.  ? ? ?ROS: ?Review of Systems  ?Constitutional:  Negative for appetite change, fatigue and unexpected weight change.  ?HENT:  Positive for dental problem. Negative for congestion, drooling, nosebleeds, sneezing, sore throat and trouble swallowing.   ?Eyes:  Negative for itching and visual disturbance.  ?Respiratory:  Negative for cough.   ?Cardiovascular:  Negative for chest pain, palpitations and leg swelling.  ?Gastrointestinal:  Negative for abdominal distention, blood in stool, diarrhea and nausea.  ?Genitourinary:  Positive for frequency and urgency. Negative for difficulty urinating, dysuria and hematuria.  ?Musculoskeletal:  Positive for arthralgias and gait problem. Negative for back pain, joint swelling and neck pain.  ?Skin:  Negative for rash.  ?Neurological:  Negative for dizziness, tremors, speech difficulty and weakness.  ?Psychiatric/Behavioral:  Negative for agitation, dysphoric mood and sleep disturbance. The patient is not nervous/anxious.   ? ?Objective:  ?BP 137/70 (BP Location: Left Arm, Patient Position: Sitting, Cuff Size: Large)   Pulse 73   Temp 98 ?F (36.7 ?C) (Oral)   Ht '6\' 2"'$  (1.88 m)   Wt 186 lb (84.4 kg)   SpO2 97%   BMI 23.88 kg/m?  ? ?BP Readings from Last 3 Encounters:  ?09/04/21 137/70  ?07/29/21 130/72  ?06/30/21 (!) 118/58  ? ? ?  Wt Readings from Last 3 Encounters:  ?09/04/21 186 lb (84.4 kg)  ?07/29/21 182 lb (82.6 kg)  ?06/30/21 182 lb 3.2 oz (82.6 kg)  ? ? ?Physical Exam ?Constitutional:   ?   General: He is not in acute distress. ?   Appearance: Normal appearance. He is well-developed.  ?   Comments: NAD  ?Eyes:  ?   Conjunctiva/sclera: Conjunctivae normal.  ?   Pupils: Pupils are equal, round, and reactive to light.  ?Neck:  ?   Thyroid: No thyromegaly.  ?   Vascular: No JVD.  ?Cardiovascular:  ?   Rate and Rhythm: Normal rate and regular rhythm.  ?   Heart sounds: Normal heart sounds. No murmur heard. ?  No friction rub.  No gallop.  ?Pulmonary:  ?   Effort: Pulmonary effort is normal. No respiratory distress.  ?   Breath sounds: Normal breath sounds. No wheezing or rales.  ?Chest:  ?   Chest wall: No tenderness.  ?Abdominal:  ?   General: Bowel sounds are normal. There is no distension.  ?   Palpations: Abdomen is soft. There is no mass.  ?   Tenderness: There is no abdominal tenderness. There is no guarding or rebound.  ?Musculoskeletal:     ?   General: No tenderness. Normal range of motion.  ?   Cervical back: Normal range of motion.  ?Lymphadenopathy:  ?   Cervical: No cervical adenopathy.  ?Skin: ?   General: Skin is warm and dry.  ?   Findings: No rash.  ?Neurological:  ?   Mental Status: He is alert and oriented to person, place, and time.  ?   Cranial Nerves: No cranial nerve deficit.  ?   Motor: Weakness present. No abnormal muscle tone.  ?   Coordination: Coordination abnormal.  ?   Gait: Gait abnormal.  ?   Deep Tendon Reflexes: Reflexes are normal and symmetric.  ?Psychiatric:     ?   Behavior: Behavior normal.     ?   Thought Content: Thought content normal.     ?   Judgment: Judgment normal.  ? ? ?Lab Results  ?Component Value Date  ? WBC 15.1 (H) 02/21/2021  ? HGB 13.3 02/21/2021  ? HCT 39.7 02/21/2021  ? PLT 302 02/21/2021  ? GLUCOSE 88 06/05/2021  ? CHOL 185 11/10/2019  ? TRIG 78 11/10/2019  ? HDL 34 (L) 11/10/2019  ? LDLDIRECT 105.0 05/31/2019  ? LDLCALC 135 (H) 11/10/2019  ? ALT 12 06/05/2021  ? AST 15 06/05/2021  ? NA 137 06/05/2021  ? K 4.7 06/05/2021  ? CL 104 06/05/2021  ? CREATININE 0.97 06/05/2021  ? BUN 19 06/05/2021  ? CO2 26 06/05/2021  ? TSH 1.14 01/16/2021  ? PSA 0.30 05/31/2019  ? INR 1.0 11/09/2019  ? HGBA1C 6.2 06/05/2021  ? ? ?DG Orthopantogram ? ?Result Date: 08/04/2021 ?CLINICAL DATA:  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 EXAM: ORTHOPANTOGRAM/PANORAMIC COMPARISON:  None. FINDINGS: Prior extraction of tooth number 1, 16, 17, 18, 32. Caries in tooth destruction of tooth 5. No lytic lesions.  Visualized portions of the paranasal sinuses are unremarkable. IMPRESSION: Caries of tooth 5.  Prior extractions. Electronically Signed   By: Nolon Nations M.D.   On: 08/04/2021 08:59  ? ? ?Assessment & Plan:  ? ?Problem List Items Addressed This Visit   ? ? Hypertension  ?  We reduced Amlodipine dose due to swelling ?  ?  ? Relevant Orders  ?  Comprehensive metabolic panel  ? Syncope  ?  No relapse ?  ?  ? Bladder neck obstruction  ?  Worse w/urge incontinence x 2 months ?Will stop Flomax ?Start Detrol LA if covered ?AZO when you go out ?  ?  ? Relevant Orders  ? Urinalysis  ? Grief  ?  Planning to move to [facility] apartments ?  ?  ? Hyperglycemia - Primary  ? Relevant Orders  ? Hemoglobin A1c  ? History of cerebrovascular accident (CVA) due to ischemia  ?  BP control ?Exercising now at the [place] ?  ?  ? Concussion  ?  Recovered ?  ?  ?  ? ? ?No orders of the defined types were placed in this encounter. ?  ? ? ?Follow-up: No follow-ups on file. ? ?[name], MD ?

## 2021-09-04 NOTE — Assessment & Plan Note (Signed)
No relapse 

## 2021-09-04 NOTE — Assessment & Plan Note (Signed)
BP control ?Exercising now at the [place] ?

## 2021-09-23 DIAGNOSIS — L570 Actinic keratosis: Secondary | ICD-10-CM | POA: Diagnosis not present

## 2021-09-23 DIAGNOSIS — D485 Neoplasm of uncertain behavior of skin: Secondary | ICD-10-CM | POA: Diagnosis not present

## 2021-09-23 DIAGNOSIS — D044 Carcinoma in situ of skin of scalp and neck: Secondary | ICD-10-CM | POA: Diagnosis not present

## 2021-10-02 ENCOUNTER — Ambulatory Visit (INDEPENDENT_AMBULATORY_CARE_PROVIDER_SITE_OTHER): Payer: Medicare HMO | Admitting: Internal Medicine

## 2021-10-02 ENCOUNTER — Encounter: Payer: Self-pay | Admitting: Internal Medicine

## 2021-10-02 DIAGNOSIS — C031 Malignant neoplasm of lower gum: Secondary | ICD-10-CM | POA: Diagnosis not present

## 2021-10-02 DIAGNOSIS — R432 Parageusia: Secondary | ICD-10-CM | POA: Diagnosis not present

## 2021-10-02 DIAGNOSIS — K120 Recurrent oral aphthae: Secondary | ICD-10-CM

## 2021-10-02 DIAGNOSIS — N320 Bladder-neck obstruction: Secondary | ICD-10-CM | POA: Diagnosis not present

## 2021-10-02 DIAGNOSIS — I63329 Cerebral infarction due to thrombosis of unspecified anterior cerebral artery: Secondary | ICD-10-CM

## 2021-10-02 DIAGNOSIS — I69354 Hemiplegia and hemiparesis following cerebral infarction affecting left non-dominant side: Secondary | ICD-10-CM

## 2021-10-02 MED ORDER — GABAPENTIN 100 MG PO CAPS: 100.0000 mg | ORAL_CAPSULE | Freq: Three times a day (TID) | ORAL | 3 refills | Status: DC

## 2021-10-02 MED ORDER — TAMSULOSIN HCL 0.4 MG PO CAPS: 0.4000 mg | ORAL_CAPSULE | Freq: Every day | ORAL | 3 refills | Status: DC

## 2021-10-02 MED ORDER — OXYBUTYNIN CHLORIDE 5 MG PO TABS
5.0000 mg | ORAL_TABLET | Freq: Three times a day (TID) | ORAL | 3 refills | Status: DC | PRN
Start: 2021-10-02 — End: 2022-08-10

## 2021-10-02 NOTE — Assessment & Plan Note (Signed)
Worse ?Re-start Flomax ?Ditropan prn ?

## 2021-10-02 NOTE — Patient Instructions (Signed)
Use [employer] Peroxicare tooth paste - brush 3-4 times a day ? ?Read about TENS units with NMES function ?

## 2021-10-02 NOTE — Progress Notes (Signed)
? ?Subjective:  ?Patient ID: Francis Sims., male    DOB: Oct 20, 1940  Age: 81 y.o. MRN: [id number] ? ?CC: No chief complaint on file. ? ? ?HPI ?Francis Sims. presents for OAB - worse after we d/c'd Flomax. He never  ?C/o worsened incontinence ?Gait is better ? ?C/o bad taste in the mouth - L side after gum cancer surgery 02/2021 ? ? ?Outpatient Medications Prior to Visit  ?Medication Sig Dispense Refill  ? amLODipine (NORVASC) 2.5 MG tablet TAKE 1 TABLET BY MOUTH EVERY DAY 90 tablet 2  ? cholecalciferol (VITAMIN D) 1000 UNITS tablet Take 1,000 Units by mouth daily.    ? clopidogrel (PLAVIX) 75 MG tablet TAKE 1 TABLET BY MOUTH EVERY DAY 90 tablet 3  ? docusate sodium (COLACE) 100 MG capsule Take 100 mg by mouth 2 (two) times daily.    ? hydroxypropyl methylcellulose / hypromellose (ISOPTO TEARS / GONIOVISC) 2.5 % ophthalmic solution Place 1 drop into both eyes 3 (three) times daily as needed for dry eyes.    ? hydrOXYzine (ATARAX) 25 MG tablet TAKE 1 TABLET BY MOUTH AT BEDTIME AS NEEDED FOR ITCHING. 90 tablet 1  ? LORazepam (ATIVAN) 0.5 MG tablet Take 1 tablet (0.5 mg total) by mouth 2 (two) times daily as needed for anxiety. 60 tablet 2  ? losartan (COZAAR) 50 MG tablet TAKE 1 TABLET BY MOUTH DAILY 90 tablet 2  ? megestrol (MEGACE) 40 MG tablet TAKE 1 TABLET BY MOUTH EVERY DAY 90 tablet 1  ? metFORMIN (GLUCOPHAGE) 500 MG tablet TAKE 1 TABLET BY MOUTH EVERY DAY WITH BREAKFAST 90 tablet 3  ? Omega-3 Fatty Acids (FISH OIL) 1000 MG CAPS Take 1,000 mg by mouth 2 (two) times daily.    ? pantoprazole (PROTONIX) 40 MG tablet Take 1 tablet (40 mg total) by mouth daily. 90 tablet 3  ? tolterodine (DETROL LA) 4 MG 24 hr capsule TAKE 1 CAPSULE BY MOUTH EVERY DAY 30 capsule 5  ? valACYclovir (VALTREX) 500 MG tablet 1 po bid for  cancer sores 60 tablet 5  ? vitamin C (ASCORBIC ACID) 250 MG tablet Take 250 mg by mouth daily.    ? tamsulosin (FLOMAX) 0.4 MG CAPS capsule TAKE 1 CAPSULE BY MOUTH EVERY DAY (Patient taking  differently: Take 0.4 mg by mouth daily.) 90 capsule 3  ? ?No facility-administered medications prior to visit.  ? ? ?ROS: ?Review of Systems  ?Constitutional:  Negative for appetite change, fatigue and unexpected weight change.  ?HENT:  Negative for congestion, nosebleeds, sneezing, sore throat and trouble swallowing.   ?Eyes:  Negative for itching and visual disturbance.  ?Respiratory:  Negative for cough.   ?Cardiovascular:  Negative for chest pain, palpitations and leg swelling.  ?Gastrointestinal:  Negative for abdominal distention, blood in stool, diarrhea and nausea.  ?Genitourinary:  Positive for enuresis, frequency and urgency. Negative for hematuria and testicular pain.  ?Musculoskeletal:  Positive for gait problem. Negative for arthralgias, back pain, joint swelling and neck pain.  ?Skin:  Negative for rash.  ?Neurological:  Positive for weakness. Negative for dizziness, tremors and speech difficulty.  ?Psychiatric/Behavioral:  Negative for agitation, dysphoric mood and sleep disturbance. The patient is not nervous/anxious.   ? ?Objective:  ?BP 138/72 (BP Location: Left Arm, Patient Position: Sitting, Cuff Size: Large)   Pulse 72   Temp 98 ?F (36.7 ?C) (Oral)   Ht '6\' 2"'$  (1.88 m)   Wt 187 lb (84.8 kg)   SpO2 95%   BMI 24.01  kg/m?  ? ?BP Readings from Last 3 Encounters:  ?10/02/21 138/72  ?09/04/21 137/70  ?07/29/21 130/72  ? ? ?Wt Readings from Last 3 Encounters:  ?10/02/21 187 lb (84.8 kg)  ?09/04/21 186 lb (84.4 kg)  ?07/29/21 182 lb (82.6 kg)  ? ? ?Physical Exam ?Constitutional:   ?   General: He is not in acute distress. ?   Appearance: Normal appearance. He is well-developed.  ?   Comments: NAD  ?Eyes:  ?   Conjunctiva/sclera: Conjunctivae normal.  ?   Pupils: Pupils are equal, round, and reactive to light.  ?Neck:  ?   Thyroid: No thyromegaly.  ?   Vascular: No JVD.  ?Cardiovascular:  ?   Rate and Rhythm: Normal rate and regular rhythm.  ?   Heart sounds: Normal heart sounds. No murmur heard. ?   No friction rub. No gallop.  ?Pulmonary:  ?   Effort: Pulmonary effort is normal. No respiratory distress.  ?   Breath sounds: Normal breath sounds. No wheezing or rales.  ?Chest:  ?   Chest wall: No tenderness.  ?Abdominal:  ?   General: Bowel sounds are normal. There is no distension.  ?   Palpations: Abdomen is soft. There is no mass.  ?   Tenderness: There is no abdominal tenderness. There is no guarding or rebound.  ?Musculoskeletal:     ?   General: No tenderness. Normal range of motion.  ?   Cervical back: Normal range of motion.  ?Lymphadenopathy:  ?   Cervical: No cervical adenopathy.  ?Skin: ?   General: Skin is warm and dry.  ?   Findings: No rash.  ?Neurological:  ?   Mental Status: He is alert and oriented to person, place, and time.  ?   Cranial Nerves: No cranial nerve deficit.  ?   Motor: Weakness present. No abnormal muscle tone.  ?   Coordination: Coordination normal.  ?   Gait: Gait abnormal.  ?   Deep Tendon Reflexes: Reflexes are normal and symmetric.  ?Psychiatric:     ?   Behavior: Behavior normal.     ?   Thought Content: Thought content normal.     ?   Judgment: Judgment normal.  ?Using a cane ?ataxic ? ?Lab Results  ?Component Value Date  ? WBC 15.1 (H) 02/21/2021  ? HGB 13.3 02/21/2021  ? HCT 39.7 02/21/2021  ? PLT 302 02/21/2021  ? GLUCOSE 107 (H) 09/04/2021  ? CHOL 185 11/10/2019  ? TRIG 78 11/10/2019  ? HDL 34 (L) 11/10/2019  ? LDLDIRECT 105.0 05/31/2019  ? LDLCALC 135 (H) 11/10/2019  ? ALT 14 09/04/2021  ? AST 17 09/04/2021  ? NA 137 09/04/2021  ? K 4.4 09/04/2021  ? CL 104 09/04/2021  ? CREATININE 1.03 09/04/2021  ? BUN 21 09/04/2021  ? CO2 26 09/04/2021  ? TSH 1.14 01/16/2021  ? PSA 0.30 05/31/2019  ? INR 1.0 11/09/2019  ? HGBA1C 6.3 09/04/2021  ? ? ?DG Orthopantogram ? ?Result Date: 08/04/2021 ?CLINICAL DATA:  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 EXAM: ORTHOPANTOGRAM/PANORAMIC COMPARISON:  None. FINDINGS: Prior extraction of tooth number 1, 16, 17, 18, 32. Caries in tooth  destruction of tooth 5. No lytic lesions. Visualized portions of the paranasal sinuses are unremarkable. IMPRESSION: Caries of tooth 5.  Prior extractions. Electronically Signed   By: Nolon Nations M.D.   On: 08/04/2021 08:59  ? ? ?Assessment & Plan:  ? ?Problem List Items Addressed This Visit   ? ?  Bladder neck obstruction  ?  Worse ?Re-start Flomax ?Ditropan prn ? ?  ?  ? Hemiparesis affecting left side as late effect of cerebrovascular accident (CVA) ([place])  ?  Better ?Read about TENS units with NMES function ?  ?  ? Cerebral infarction due to thrombosis of anterior cerebral artery ([place])  ?  Better ?Read about TENS units with NMES function ?  ?  ? Canker sores oral  ?  On Valtrex ? ?  ?  ? 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  Use [employer] Peroxicare tooth paste - brush 3-4 times a day ? ?  ?  ? Taste impairment  ?  C/o bad taste in the mouth - L side after gum cancer surgery 02/2021 ?Use [employer] Peroxicare tooth paste - brush 3-4 times a day ?Consider Gabapentin - ??possible zoster or surgery triggered ? ?  ?  ?  ? ? ?Meds ordered this encounter  ?Medications  ? tamsulosin (FLOMAX) 0.4 MG CAPS capsule  ?  Sig: Take 1 capsule (0.4 mg total) by mouth daily.  ?  Dispense:  90 capsule  ?  Refill:  3  ? oxybutynin (DITROPAN) 5 MG tablet  ?  Sig: Take 1 tablet (5 mg total) by mouth every 8 (eight) hours as needed for bladder spasms (to prevent urinary symptoms when you go out).  ?  Dispense:  90 tablet  ?  Refill:  3  ? gabapentin (NEURONTIN) 100 MG capsule  ?  Sig: Take 1 capsule (100 mg total) by mouth 3 (three) times daily.  ?  Dispense:  90 capsule  ?  Refill:  3  ?  ? ? ?Follow-up: Return in about 2 months (around 12/02/2021) for a follow-up visit. ? ?[name], MD ?

## 2021-10-02 NOTE — Assessment & Plan Note (Addendum)
C/o bad taste in the mouth - L side after gum cancer surgery 02/2021 ?Use Arm&Hammer Peroxicare tooth paste - brush 3-4 times a day ?Consider Gabapentin - ??possible zoster or surgery triggered ? ?

## 2021-10-02 NOTE — Assessment & Plan Note (Signed)
Use [employer]&Hammer Peroxicare tooth paste - brush 3-4 times a day ? ?

## 2021-10-02 NOTE — Assessment & Plan Note (Signed)
Better ?Read about TENS units with NMES function ?

## 2021-10-02 NOTE — Assessment & Plan Note (Signed)
On Valtrex 

## 2021-10-06 ENCOUNTER — Other Ambulatory Visit: Payer: Self-pay | Admitting: Internal Medicine

## 2021-10-30 DIAGNOSIS — Z0489 Encounter for examination and observation for other specified reasons: Secondary | ICD-10-CM | POA: Diagnosis not present

## 2021-12-09 ENCOUNTER — Ambulatory Visit (INDEPENDENT_AMBULATORY_CARE_PROVIDER_SITE_OTHER): Payer: Medicare HMO | Admitting: Internal Medicine

## 2021-12-09 ENCOUNTER — Encounter: Payer: Self-pay | Admitting: Internal Medicine

## 2021-12-09 VITALS — BP 110/62 | HR 71 | Temp 98.2°F | Ht 74.0 in | Wt 185.0 lb

## 2021-12-09 DIAGNOSIS — E785 Hyperlipidemia, unspecified: Secondary | ICD-10-CM | POA: Diagnosis not present

## 2021-12-09 DIAGNOSIS — K5904 Chronic idiopathic constipation: Secondary | ICD-10-CM | POA: Diagnosis not present

## 2021-12-09 DIAGNOSIS — R634 Abnormal weight loss: Secondary | ICD-10-CM

## 2021-12-09 DIAGNOSIS — N320 Bladder-neck obstruction: Secondary | ICD-10-CM

## 2021-12-09 DIAGNOSIS — M25551 Pain in right hip: Secondary | ICD-10-CM

## 2021-12-09 DIAGNOSIS — R739 Hyperglycemia, unspecified: Secondary | ICD-10-CM

## 2021-12-09 DIAGNOSIS — K5900 Constipation, unspecified: Secondary | ICD-10-CM | POA: Insufficient documentation

## 2021-12-09 LAB — COMPREHENSIVE METABOLIC PANEL
ALT: 17 U/L (ref 0–53)
AST: 17 U/L (ref 0–37)
Albumin: 4.4 g/dL (ref 3.5–5.2)
Alkaline Phosphatase: 74 U/L (ref 39–117)
BUN: 25 mg/dL — ABNORMAL HIGH (ref 6–23)
CO2: 28 mEq/L (ref 19–32)
Calcium: 9.9 mg/dL (ref 8.4–10.5)
Chloride: 103 mEq/L (ref 96–112)
Creatinine, Ser: 1.13 mg/dL (ref 0.40–1.50)
GFR: 62.07 mL/min (ref >60.00)
Glucose, Bld: 123 mg/dL — ABNORMAL HIGH (ref 70–99)
Potassium: 4.4 mEq/L (ref 3.5–5.1)
Sodium: 138 mEq/L (ref 135–145)
Total Bilirubin: 0.7 mg/dL (ref 0.2–1.2)
Total Protein: 7.6 g/dL (ref 6.0–8.3)

## 2021-12-09 LAB — HEMOGLOBIN A1C: Hgb A1c MFr Bld: 6.4 % (ref 4.6–6.5)

## 2021-12-09 MED ORDER — SENNA-DOCUSATE SODIUM 8.6-50 MG PO TABS: 1.0000 | ORAL_TABLET | Freq: Every day | ORAL | 3 refills | Status: DC

## 2021-12-09 NOTE — Assessment & Plan Note (Signed)
Start Senokot S 1-2 a day

## 2021-12-09 NOTE — Assessment & Plan Note (Signed)
AZO when you go out

## 2021-12-09 NOTE — Progress Notes (Signed)
Subjective:  Patient ID: Francis Sims., male    DOB: 1941-05-05  Age: 81 y.o. MRN: [id number]  CC: No chief complaint on file.   HPI Francis Sims. presents for CVA, HTN, anxiety C/o constipation - worse  Moved to [facility] apartment in 11/2021 C/o R hip pain  Outpatient Medications Prior to Visit  Medication Sig Dispense Refill   amLODipine (NORVASC) 2.5 MG tablet TAKE 1 TABLET BY MOUTH EVERY DAY 90 tablet 2   cholecalciferol (VITAMIN D) 1000 UNITS tablet Take 1,000 Units by mouth daily.     clopidogrel (PLAVIX) 75 MG tablet TAKE 1 TABLET BY MOUTH EVERY DAY 90 tablet 3   gabapentin (NEURONTIN) 100 MG capsule Take 1 capsule (100 mg total) by mouth 3 (three) times daily. 90 capsule 3   hydroxypropyl methylcellulose / hypromellose (ISOPTO TEARS / GONIOVISC) 2.5 % ophthalmic solution Place 1 drop into both eyes 3 (three) times daily as needed for dry eyes.     hydrOXYzine (ATARAX) 25 MG tablet TAKE 1 TABLET BY MOUTH AT BEDTIME AS NEEDED FOR ITCHING. 90 tablet 1   LORazepam (ATIVAN) 0.5 MG tablet Take 1 tablet (0.5 mg total) by mouth 2 (two) times daily as needed for anxiety. 60 tablet 2   losartan (COZAAR) 50 MG tablet TAKE 1 TABLET BY MOUTH DAILY 90 tablet 2   megestrol (MEGACE) 40 MG tablet TAKE 1 TABLET BY MOUTH EVERY DAY 90 tablet 1   metFORMIN (GLUCOPHAGE) 500 MG tablet TAKE 1 TABLET BY MOUTH EVERY DAY WITH BREAKFAST 90 tablet 3   Omega-3 Fatty Acids (FISH OIL) 1000 MG CAPS Take 1,000 mg by mouth 2 (two) times daily.     oxybutynin (DITROPAN) 5 MG tablet Take 1 tablet (5 mg total) by mouth every 8 (eight) hours as needed for bladder spasms (to prevent urinary symptoms when you go out). 90 tablet 3   pantoprazole (PROTONIX) 40 MG tablet Take 1 tablet (40 mg total) by mouth daily. 90 tablet 3   tamsulosin (FLOMAX) 0.4 MG CAPS capsule Take 1 capsule (0.4 mg total) by mouth daily. 90 capsule 3   tolterodine (DETROL LA) 4 MG 24 hr capsule TAKE 1 CAPSULE BY MOUTH EVERY DAY 30  capsule 5   valACYclovir (VALTREX) 500 MG tablet TAKE 1 TABLET BY MOUTH TWICE A DAY FOR CANCER SORES 180 tablet 1   vitamin C (ASCORBIC ACID) 250 MG tablet Take 250 mg by mouth daily.     docusate sodium (COLACE) 100 MG capsule Take 100 mg by mouth 2 (two) times daily.     No facility-administered medications prior to visit.    ROS: Review of Systems  Constitutional:  Negative for appetite change, fatigue and unexpected weight change.  HENT:  Negative for congestion, nosebleeds, sneezing, sore throat and trouble swallowing.   Eyes:  Negative for itching and visual disturbance.  Respiratory:  Negative for cough.   Cardiovascular:  Negative for chest pain, palpitations and leg swelling.  Gastrointestinal:  Positive for constipation. Negative for abdominal distention, blood in stool, diarrhea and nausea.  Genitourinary:  Negative for frequency and hematuria.  Musculoskeletal:  Positive for gait problem. Negative for back pain, joint swelling and neck pain.  Skin:  Negative for rash.  Neurological:  Negative for dizziness, tremors, speech difficulty and weakness.  Psychiatric/Behavioral:  Positive for dysphoric mood. Negative for agitation, sleep disturbance and suicidal ideas. The patient is not nervous/anxious.     Objective:  BP 110/62 (BP Location: Left Arm, Patient  Position: Sitting, Cuff Size: Normal)   Pulse 71   Temp 98.2 F (36.8 C) (Oral)   Ht 6\' 2"  (1.88 m)   Wt 185 lb (83.9 kg)   SpO2 96%   BMI 23.75 kg/m   BP Readings from Last 3 Encounters:  12/09/21 110/62  10/02/21 138/72  09/04/21 137/70    Wt Readings from Last 3 Encounters:  12/09/21 185 lb (83.9 kg)  10/02/21 187 lb (84.8 kg)  09/04/21 186 lb (84.4 kg)    Physical Exam Constitutional:      General: He is not in acute distress.    Appearance: He is well-developed.     Comments: NAD  Eyes:     Conjunctiva/sclera: Conjunctivae normal.     Pupils: Pupils are equal, round, and reactive to light.  Neck:      Thyroid: No thyromegaly.     Vascular: No JVD.  Cardiovascular:     Rate and Rhythm: Normal rate and regular rhythm.     Heart sounds: Normal heart sounds. No murmur heard.    No friction rub. No gallop.  Pulmonary:     Effort: Pulmonary effort is normal. No respiratory distress.     Breath sounds: Normal breath sounds. No wheezing or rales.  Chest:     Chest wall: No tenderness.  Abdominal:     General: Bowel sounds are normal. There is no distension.     Palpations: Abdomen is soft. There is no mass.     Tenderness: There is no abdominal tenderness. There is no guarding or rebound.  Musculoskeletal:        General: No tenderness. Normal range of motion.     Cervical back: Normal range of motion.  Lymphadenopathy:     Cervical: No cervical adenopathy.  Skin:    General: Skin is warm and dry.     Findings: No rash.  Neurological:     Mental Status: He is alert and oriented to person, place, and time.     Cranial Nerves: No cranial nerve deficit.     Motor: No abnormal muscle tone.     Coordination: Coordination normal.     Gait: Gait normal.     Deep Tendon Reflexes: Reflexes are normal and symmetric.  Psychiatric:        Behavior: Behavior normal.        Thought Content: Thought content normal.        Judgment: Judgment normal.     Lab Results  Component Value Date   WBC 15.1 (H) 02/21/2021   HGB 13.3 02/21/2021   HCT 39.7 02/21/2021   PLT 302 02/21/2021   GLUCOSE 107 (H) 09/04/2021   CHOL 185 11/10/2019   TRIG 78 11/10/2019   HDL 34 (L) 11/10/2019   LDLDIRECT 105.0 05/31/2019   LDLCALC 135 (H) 11/10/2019   ALT 14 09/04/2021   AST 17 09/04/2021   NA 137 09/04/2021   K 4.4 09/04/2021   CL 104 09/04/2021   CREATININE 1.03 09/04/2021   BUN 21 09/04/2021   CO2 26 09/04/2021   TSH 1.14 01/16/2021   PSA 0.30 05/31/2019   INR 1.0 11/09/2019   HGBA1C 6.3 09/04/2021    DG Orthopantogram  Result Date: 08/04/2021 CLINICAL DATA:  Cancer of the alveolus of  the mandible. EXAM: ORTHOPANTOGRAM/PANORAMIC COMPARISON:  None. FINDINGS: Prior extraction of tooth number 1, 16, 17, 18, 32. Caries in tooth destruction of tooth 5. No lytic lesions. Visualized portions of the paranasal sinuses are unremarkable. IMPRESSION: Caries of  tooth 5.  Prior extractions. Electronically Signed   By: Norva Pavlov M.D.   On: 08/04/2021 08:59    Assessment & Plan:   Problem List Items Addressed This Visit     Bladder neck obstruction    AZO when you go out      Constipation    Start Senokot S 1-2 a day      Dyslipidemia    Declined statins      Hip pain, right    Blue-Emu cream was recommended to use 2-3 times a day       Hyperglycemia - Primary   Relevant Orders   Comprehensive metabolic panel   Hemoglobin A1c   Weight loss    Wt Readings from Last 3 Encounters:  12/09/21 185 lb (83.9 kg)  10/02/21 187 lb (84.8 kg)  09/04/21 186 lb (84.4 kg)   Moved to [place] apartment in 11/2021         Meds ordered this encounter  Medications   sennosides-docusate sodium (SENOKOT-S) 8.6-50 MG tablet    Sig: Take 1-2 tablets by mouth daily.    Dispense:  100 tablet    Refill:  3      Follow-up: Return in about 3 months (around 03/11/2022) for a follow-up visit.  Sonda Primes, MD

## 2021-12-09 NOTE — Assessment & Plan Note (Signed)
Wt Readings from Last 3 Encounters:  12/09/21 185 lb (83.9 kg)  10/02/21 187 lb (84.8 kg)  09/04/21 186 lb (84.4 kg)    Moved to [facility] apartment in 11/2021

## 2021-12-22 DIAGNOSIS — D225 Melanocytic nevi of trunk: Secondary | ICD-10-CM | POA: Diagnosis not present

## 2021-12-22 DIAGNOSIS — Z85828 Personal history of other malignant neoplasm of skin: Secondary | ICD-10-CM | POA: Diagnosis not present

## 2021-12-22 DIAGNOSIS — L820 Inflamed seborrheic keratosis: Secondary | ICD-10-CM | POA: Diagnosis not present

## 2021-12-22 DIAGNOSIS — L570 Actinic keratosis: Secondary | ICD-10-CM | POA: Diagnosis not present

## 2022-02-02 ENCOUNTER — Telehealth: Payer: Self-pay | Admitting: *Deleted

## 2022-02-02 NOTE — Patient Outreach (Signed)
  Care Coordination   02/02/2022 Name: Francis Sims. MRN: [id number] DOB: [birth year]   Care Coordination Outreach Attempts:  An unsuccessful telephone outreach was attempted today to offer the patient information about available care coordination services as a benefit of their health plan.   Follow Up Plan:  Additional outreach attempts will be made to offer the patient care coordination information and services.   Encounter Outcome:  Pt. Request to Call Back  Care Coordination Interventions Activated:  No   Care Coordination Interventions:  No, not indicated    Emelia Loron RN, BSN [place] [phone] [email or web address]

## 2022-02-09 ENCOUNTER — Ambulatory Visit: Payer: Self-pay

## 2022-02-09 NOTE — Patient Outreach (Signed)
  Care Coordination   Initial Visit Note   02/09/2022 Name: Francis Sims. MRN: [id number] DOB: 04-13-1941  Francis Sims. is a 81 y.o. year old male who sees Plotnikov, Evie Lacks, MD for primary care. I spoke with  Francis Sims. by phone today.  What matters to the patients health and wellness today?  Patient declines and care management, care coordination, disease management or resource needs at this time.    Goals Addressed             This Visit's Progress    COMPLETED: Care Coordination Activities-no further follow up required       Care Coordination Interventions: Advised patient to contact primary care provider and/or Case Manager if care coordination needs change Reviewed Care Gaps Confirmed Annual Wellness visit completed         SDOH assessments and interventions completed:  Yes  SDOH Interventions Today    Flowsheet Row Most Recent Value  SDOH Interventions   Food Insecurity Interventions Intervention Not Indicated  Housing Interventions Intervention Not Indicated  Transportation Interventions Intervention Not Indicated        Care Coordination Interventions Activated:  Yes  Care Coordination Interventions:  Yes, provided   Follow up plan: No further intervention required.   Encounter Outcome:  Pt. Visit Completed   Thea Silversmith, RN, MSN, BSN, CCM Care Coordinator [phone]

## 2022-02-16 ENCOUNTER — Other Ambulatory Visit: Payer: Self-pay | Admitting: Internal Medicine

## 2022-03-10 ENCOUNTER — Ambulatory Visit (INDEPENDENT_AMBULATORY_CARE_PROVIDER_SITE_OTHER): Payer: Medicare HMO

## 2022-03-10 ENCOUNTER — Encounter: Payer: Self-pay | Admitting: Internal Medicine

## 2022-03-10 ENCOUNTER — Ambulatory Visit (INDEPENDENT_AMBULATORY_CARE_PROVIDER_SITE_OTHER): Payer: Medicare HMO | Admitting: Internal Medicine

## 2022-03-10 VITALS — BP 138/70 | HR 63 | Temp 99.0°F | Ht 74.0 in | Wt 187.8 lb

## 2022-03-10 DIAGNOSIS — R296 Repeated falls: Secondary | ICD-10-CM | POA: Diagnosis not present

## 2022-03-10 DIAGNOSIS — R69 Illness, unspecified: Secondary | ICD-10-CM | POA: Diagnosis not present

## 2022-03-10 DIAGNOSIS — R739 Hyperglycemia, unspecified: Secondary | ICD-10-CM | POA: Diagnosis not present

## 2022-03-10 DIAGNOSIS — R10A0 Flank pain, unspecified side: Secondary | ICD-10-CM

## 2022-03-10 DIAGNOSIS — R109 Unspecified abdominal pain: Secondary | ICD-10-CM | POA: Diagnosis not present

## 2022-03-10 DIAGNOSIS — R634 Abnormal weight loss: Secondary | ICD-10-CM

## 2022-03-10 DIAGNOSIS — I69354 Hemiplegia and hemiparesis following cerebral infarction affecting left non-dominant side: Secondary | ICD-10-CM | POA: Diagnosis not present

## 2022-03-10 DIAGNOSIS — F4321 Adjustment disorder with depressed mood: Secondary | ICD-10-CM

## 2022-03-10 DIAGNOSIS — R0781 Pleurodynia: Secondary | ICD-10-CM | POA: Diagnosis not present

## 2022-03-10 LAB — COMPREHENSIVE METABOLIC PANEL
ALT: 14 U/L (ref 0–53)
AST: 19 U/L (ref 0–37)
Albumin: 4.4 g/dL (ref 3.5–5.2)
Alkaline Phosphatase: 85 U/L (ref 39–117)
BUN: 18 mg/dL (ref 6–23)
CO2: 29 mEq/L (ref 19–32)
Calcium: 9.8 mg/dL (ref 8.4–10.5)
Chloride: 102 mEq/L (ref 96–112)
Creatinine, Ser: 1.17 mg/dL (ref 0.40–1.50)
GFR: 59.43 mL/min — ABNORMAL LOW (ref >60.00)
Glucose, Bld: 111 mg/dL — ABNORMAL HIGH (ref 70–99)
Potassium: 4.5 mEq/L (ref 3.5–5.1)
Sodium: 138 mEq/L (ref 135–145)
Total Bilirubin: 0.9 mg/dL (ref 0.2–1.2)
Total Protein: 7.9 g/dL (ref 6.0–8.3)

## 2022-03-10 LAB — CBC WITH DIFFERENTIAL/PLATELET
Basophils Absolute: 0.1 10*3/uL (ref 0.0–0.1)
Basophils Relative: 0.7 % (ref 0.0–3.0)
Eosinophils Absolute: 0.1 10*3/uL (ref 0.0–0.7)
Eosinophils Relative: 1.2 % (ref 0.0–5.0)
HCT: 41.9 % (ref 39.0–52.0)
Hemoglobin: 14 g/dL (ref 13.0–17.0)
Lymphocytes Relative: 24.8 % (ref 12.0–46.0)
Lymphs Abs: 2.2 10*3/uL (ref 0.7–4.0)
MCHC: 33.5 g/dL (ref 30.0–36.0)
MCV: 83.7 fl (ref 78.0–100.0)
Monocytes Absolute: 0.7 10*3/uL (ref 0.1–1.0)
Monocytes Relative: 8.4 % (ref 3.0–12.0)
Neutro Abs: 5.7 10*3/uL (ref 1.4–7.7)
Neutrophils Relative %: 64.9 % (ref 43.0–77.0)
Platelets: 259 10*3/uL (ref 150.0–400.0)
RBC: 5 Mil/uL (ref 4.22–5.81)
RDW: 13.9 % (ref 11.5–15.5)
WBC: 8.7 10*3/uL (ref 4.0–10.5)

## 2022-03-10 LAB — HEMOGLOBIN A1C: Hgb A1c MFr Bld: 6.2 % (ref 4.6–6.5)

## 2022-03-10 NOTE — Assessment & Plan Note (Signed)
More active now

## 2022-03-10 NOTE — Assessment & Plan Note (Signed)
Better  

## 2022-03-10 NOTE — Assessment & Plan Note (Signed)
Resolved

## 2022-03-10 NOTE — Progress Notes (Signed)
Subjective:  Patient ID: Francis Ade., male    DOB: [birth year]  Age: 81 y.o. MRN: [id number]  CC: Follow-up (3 month f/u)   HPI Francis M Gonder Jr. presents for CVA, HTN, DM Moved to [facility] in 10/2021 C/o L flank pain on the left x 6 weeks - worse when going over speed bumps  Outpatient Medications Prior to Visit  Medication Sig Dispense Refill   amLODipine (NORVASC) 2.5 MG tablet TAKE 1 TABLET BY MOUTH EVERY DAY 90 tablet 2   cholecalciferol (VITAMIN D) 1000 UNITS tablet Take 1,000 Units by mouth daily.     clopidogrel (PLAVIX) 75 MG tablet TAKE 1 TABLET BY MOUTH EVERY DAY 90 tablet 3   gabapentin (NEURONTIN) 100 MG capsule Take 1 capsule (100 mg total) by mouth 3 (three) times daily. 90 capsule 3   hydroxypropyl methylcellulose / hypromellose (ISOPTO TEARS / GONIOVISC) 2.5 % ophthalmic solution Place 1 drop into both eyes 3 (three) times daily as needed for dry eyes.     hydrOXYzine (ATARAX) 25 MG tablet TAKE 1 TABLET BY MOUTH AT BEDTIME AS NEEDED FOR ITCHING. 90 tablet 1   LORazepam (ATIVAN) 0.5 MG tablet Take 1 tablet (0.5 mg total) by mouth 2 (two) times daily as needed for anxiety. 60 tablet 2   losartan (COZAAR) 50 MG tablet TAKE 1 TABLET BY MOUTH DAILY 90 tablet 2   megestrol (MEGACE) 40 MG tablet TAKE 1 TABLET BY MOUTH EVERY DAY 90 tablet 1   metFORMIN (GLUCOPHAGE) 500 MG tablet TAKE 1 TABLET BY MOUTH EVERY DAY WITH BREAKFAST 90 tablet 3   Omega-3 Fatty Acids (FISH OIL) 1000 MG CAPS Take 1,000 mg by mouth 2 (two) times daily.     oxybutynin (DITROPAN) 5 MG tablet Take 1 tablet (5 mg total) by mouth every 8 (eight) hours as needed for bladder spasms (to prevent urinary symptoms when you go out). 90 tablet 3   pantoprazole (PROTONIX) 40 MG tablet Take 1 tablet (40 mg total) by mouth daily. 90 tablet 3   sennosides-docusate sodium (SENOKOT-S) 8.6-50 MG tablet Take 1-2 tablets by mouth daily. 100 tablet 3   tamsulosin (FLOMAX) 0.4 MG CAPS capsule Take 1 capsule (0.4 mg  total) by mouth daily. 90 capsule 3   tolterodine (DETROL LA) 4 MG 24 hr capsule TAKE 1 CAPSULE BY MOUTH EVERY DAY 30 capsule 5   valACYclovir (VALTREX) 500 MG tablet TAKE 1 TABLET BY MOUTH TWICE A DAY FOR CANCER SORES 180 tablet 1   vitamin C (ASCORBIC ACID) 250 MG tablet Take 250 mg by mouth daily.     No facility-administered medications prior to visit.    ROS: Review of Systems  Constitutional:  Negative for appetite change, fatigue and unexpected weight change.  HENT:  Negative for congestion, nosebleeds, sneezing, sore throat and trouble swallowing.   Eyes:  Negative for itching and visual disturbance.  Respiratory:  Negative for cough.   Cardiovascular:  Negative for chest pain, palpitations and leg swelling.  Gastrointestinal:  Negative for abdominal distention, blood in stool, diarrhea and nausea.  Genitourinary:  Negative for frequency and hematuria.  Musculoskeletal:  Positive for back pain and gait problem. Negative for joint swelling and neck pain.  Skin:  Negative for rash.  Neurological:  Negative for dizziness, tremors, speech difficulty and weakness.  Psychiatric/Behavioral:  Negative for agitation, dysphoric mood and sleep disturbance. The patient is not nervous/anxious.     Objective:  BP 138/70 (BP Location: Left Arm)   Pulse  63   Temp 99 F (37.2 C) (Oral)   Ht '6\' 2"'$  (1.88 m)   Wt 187 lb 12.8 oz (85.2 kg)   SpO2 96%   BMI 24.11 kg/m   BP Readings from Last 3 Encounters:  03/10/22 138/70  12/09/21 110/62  10/02/21 138/72    Wt Readings from Last 3 Encounters:  03/10/22 187 lb 12.8 oz (85.2 kg)  12/09/21 185 lb (83.9 kg)  10/02/21 187 lb (84.8 kg)    Physical Exam Constitutional:      General: He is not in acute distress.    Appearance: He is well-developed.     Comments: NAD  Eyes:     Conjunctiva/sclera: Conjunctivae normal.     Pupils: Pupils are equal, round, and reactive to light.  Neck:     Thyroid: No thyromegaly.     Vascular: No JVD.   Cardiovascular:     Rate and Rhythm: Normal rate and regular rhythm.     Heart sounds: Normal heart sounds. No murmur heard.    No friction rub. No gallop.  Pulmonary:     Effort: Pulmonary effort is normal. No respiratory distress.     Breath sounds: Normal breath sounds. No wheezing or rales.  Chest:     Chest wall: No tenderness.  Abdominal:     General: Bowel sounds are normal. There is no distension.     Palpations: Abdomen is soft. There is no mass.     Tenderness: There is no abdominal tenderness. There is no guarding or rebound.  Musculoskeletal:        General: No tenderness. Normal range of motion.     Cervical back: Normal range of motion.  Lymphadenopathy:     Cervical: No cervical adenopathy.  Skin:    General: Skin is warm and dry.     Findings: No rash.  Neurological:     Mental Status: He is alert and oriented to person, place, and time.     Cranial Nerves: No cranial nerve deficit.     Motor: No abnormal muscle tone.     Coordination: Coordination normal.     Gait: Gait abnormal.     Deep Tendon Reflexes: Reflexes are normal and symmetric.  Psychiatric:        Behavior: Behavior normal.        Thought Content: Thought content normal.        Judgment: Judgment normal.   L lower ribs w/pain  Lab Results  Component Value Date   WBC 15.1 (H) 02/21/2021   HGB 13.3 02/21/2021   HCT 39.7 02/21/2021   PLT 302 02/21/2021   GLUCOSE 123 (H) 12/09/2021   CHOL 185 11/10/2019   TRIG 78 11/10/2019   HDL 34 (L) 11/10/2019   LDLDIRECT 105.0 05/31/2019   LDLCALC 135 (H) 11/10/2019   ALT 17 12/09/2021   AST 17 12/09/2021   NA 138 12/09/2021   K 4.4 12/09/2021   CL 103 12/09/2021   CREATININE 1.13 12/09/2021   BUN 25 (H) 12/09/2021   CO2 28 12/09/2021   TSH 1.14 01/16/2021   PSA 0.30 05/31/2019   INR 1.0 11/09/2019   HGBA1C 6.4 12/09/2021    DG Orthopantogram  Result Date: 08/04/2021 CLINICAL DATA:  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 EXAM:  ORTHOPANTOGRAM/PANORAMIC COMPARISON:  None. FINDINGS: Prior extraction of tooth number 1, 16, 17, 18, 32. Caries in tooth destruction of tooth 5. No lytic lesions. Visualized portions of the paranasal sinuses are unremarkable. IMPRESSION: Caries of  tooth 5.  Prior extractions. Electronically Signed   By: Nolon Nations M.D.   On: 08/04/2021 08:59    Assessment & Plan:   Problem List Items Addressed This Visit     Falls frequently    Better      Flank pain - Primary    New L flank pain on the left x 6 weeks Ribs X ray UA Abd CT if not better soon      Relevant Orders   DG Ribs Unilateral Left   CBC with Differential/Platelet   Comprehensive metabolic panel   Urinalysis   Grief    Lorazepam - rare use Coping a little better      Hemiparesis affecting left side as late effect of cerebrovascular accident (CVA) ([place])    More active now      Weight loss    Resolved         No orders of the defined types were placed in this encounter.     Follow-up: Return in about 3 months (around 06/09/2022) for a follow-up visit.  [name], MD

## 2022-03-10 NOTE — Assessment & Plan Note (Signed)
New L flank pain on the left x 6 weeks Ribs X ray UA Abd CT if not better soon

## 2022-03-10 NOTE — Assessment & Plan Note (Signed)
Lorazepam - rare use Coping a little better

## 2022-03-22 ENCOUNTER — Other Ambulatory Visit: Payer: Self-pay | Admitting: Internal Medicine

## 2022-04-01 ENCOUNTER — Other Ambulatory Visit: Payer: Self-pay | Admitting: Internal Medicine

## 2022-05-17 ENCOUNTER — Other Ambulatory Visit: Payer: Self-pay | Admitting: Internal Medicine

## 2022-05-27 ENCOUNTER — Ambulatory Visit (INDEPENDENT_AMBULATORY_CARE_PROVIDER_SITE_OTHER): Payer: Medicare HMO | Admitting: Internal Medicine

## 2022-05-27 ENCOUNTER — Encounter: Payer: Self-pay | Admitting: Internal Medicine

## 2022-05-27 VITALS — BP 120/62 | HR 67 | Temp 98.1°F | Ht 74.0 in | Wt 191.0 lb

## 2022-05-27 DIAGNOSIS — R109 Unspecified abdominal pain: Secondary | ICD-10-CM | POA: Diagnosis not present

## 2022-05-27 DIAGNOSIS — H6123 Impacted cerumen, bilateral: Secondary | ICD-10-CM

## 2022-05-27 DIAGNOSIS — R1012 Left upper quadrant pain: Secondary | ICD-10-CM

## 2022-05-27 LAB — CBC WITH DIFFERENTIAL/PLATELET
Basophils Absolute: 0.1 10*3/uL (ref 0.0–0.1)
Basophils Relative: 0.6 % (ref 0.0–3.0)
Eosinophils Absolute: 0.1 10*3/uL (ref 0.0–0.7)
Eosinophils Relative: 1.2 % (ref 0.0–5.0)
HCT: 44.3 % (ref 39.0–52.0)
Hemoglobin: 14.7 g/dL (ref 13.0–17.0)
Lymphocytes Relative: 26.7 % (ref 12.0–46.0)
Lymphs Abs: 2.6 10*3/uL (ref 0.7–4.0)
MCHC: 33.3 g/dL (ref 30.0–36.0)
MCV: 83.1 fl (ref 78.0–100.0)
Monocytes Absolute: 1 10*3/uL (ref 0.1–1.0)
Monocytes Relative: 10.2 % (ref 3.0–12.0)
Neutro Abs: 5.9 10*3/uL (ref 1.4–7.7)
Neutrophils Relative %: 61.3 % (ref 43.0–77.0)
Platelets: 278 10*3/uL (ref 150.0–400.0)
RBC: 5.33 Mil/uL (ref 4.22–5.81)
RDW: 15.6 % — ABNORMAL HIGH (ref 11.5–15.5)
WBC: 9.6 10*3/uL (ref 4.0–10.5)

## 2022-05-27 LAB — COMPREHENSIVE METABOLIC PANEL
ALT: 18 U/L (ref 0–53)
AST: 25 U/L (ref 0–37)
Albumin: 4.5 g/dL (ref 3.5–5.2)
Alkaline Phosphatase: 87 U/L (ref 39–117)
BUN: 22 mg/dL (ref 6–23)
CO2: 30 mEq/L (ref 19–32)
Calcium: 10 mg/dL (ref 8.4–10.5)
Chloride: 102 mEq/L (ref 96–112)
Creatinine, Ser: 1 mg/dL (ref 0.40–1.50)
GFR: 71.64 mL/min (ref >60.00)
Glucose, Bld: 123 mg/dL — ABNORMAL HIGH (ref 70–99)
Potassium: 4.4 mEq/L (ref 3.5–5.1)
Sodium: 140 mEq/L (ref 135–145)
Total Bilirubin: 1.2 mg/dL (ref 0.2–1.2)
Total Protein: 7.7 g/dL (ref 6.0–8.3)

## 2022-05-27 LAB — URINALYSIS
Bilirubin Urine: NEGATIVE
Hgb urine dipstick: NEGATIVE
Ketones, ur: NEGATIVE
Leukocytes,Ua: NEGATIVE
Nitrite: NEGATIVE
Specific Gravity, Urine: 1.025 (ref 1.000–1.030)
Total Protein, Urine: NEGATIVE
Urine Glucose: NEGATIVE
Urobilinogen, UA: 0.2 (ref 0.0–1.0)
pH: 6 (ref 5.0–8.0)

## 2022-05-27 NOTE — Assessment & Plan Note (Signed)
?  etiology UA, CBC, CMET Abd CT ordered

## 2022-05-27 NOTE — Progress Notes (Signed)
Subjective:  Patient ID: [name], male    DOB: 1941-05-19  Age: 81 y.o. MRN: [id number]  CC: Follow-up (3 month f/u)   HPI Jillian M Mauney Jr. presents for L rib cage/flank pain on the L >3 months, worse off and on   Outpatient Medications Prior to Visit  Medication Sig Dispense Refill   amLODipine (NORVASC) 2.5 MG tablet TAKE 1 TABLET BY MOUTH EVERY DAY 90 tablet 2   cholecalciferol (VITAMIN D) 1000 UNITS tablet Take 1,000 Units by mouth daily.     clopidogrel (PLAVIX) 75 MG tablet TAKE 1 TABLET BY MOUTH EVERY DAY 90 tablet 3   gabapentin (NEURONTIN) 100 MG capsule Take 1 capsule (100 mg total) by mouth 3 (three) times daily. 90 capsule 3   hydroxypropyl methylcellulose / hypromellose (ISOPTO TEARS / GONIOVISC) 2.5 % ophthalmic solution Place 1 drop into both eyes 3 (three) times daily as needed for dry eyes.     hydrOXYzine (ATARAX) 25 MG tablet TAKE 1 TABLET BY MOUTH AT BEDTIME AS NEEDED FOR ITCHING. 90 tablet 1   LORazepam (ATIVAN) 0.5 MG tablet Take 1 tablet (0.5 mg total) by mouth 2 (two) times daily as needed for anxiety. 60 tablet 2   losartan (COZAAR) 50 MG tablet TAKE 1 TABLET BY MOUTH DAILY 90 tablet 2   megestrol (MEGACE) 40 MG tablet TAKE 1 TABLET BY MOUTH EVERY DAY 90 tablet 1   metFORMIN (GLUCOPHAGE) 500 MG tablet TAKE 1 TABLET BY MOUTH EVERY DAY WITH BREAKFAST 90 tablet 3   Omega-3 Fatty Acids (FISH OIL) 1000 MG CAPS Take 1,000 mg by mouth 2 (two) times daily.     oxybutynin (DITROPAN) 5 MG tablet Take 1 tablet (5 mg total) by mouth every 8 (eight) hours as needed for bladder spasms (to prevent urinary symptoms when you go out). 90 tablet 3   pantoprazole (PROTONIX) 40 MG tablet TAKE 1 TABLET BY MOUTH EVERY DAY 90 tablet 3   sennosides-docusate sodium (SENOKOT-S) 8.6-50 MG tablet Take 1-2 tablets by mouth daily. 100 tablet 3   tamsulosin (FLOMAX) 0.4 MG CAPS capsule Take 1 capsule (0.4 mg total) by mouth daily. 90 capsule 3   tolterodine (DETROL LA) 4 MG 24 hr  capsule TAKE 1 CAPSULE BY MOUTH EVERY DAY 30 capsule 5   valACYclovir (VALTREX) 500 MG tablet TAKE 1 TABLET BY MOUTH TWICE A DAY FOR CANCER SORES 180 tablet 1   vitamin C (ASCORBIC ACID) 250 MG tablet Take 250 mg by mouth daily.     No facility-administered medications prior to visit.    ROS: Review of Systems  Constitutional:  Negative for appetite change, fatigue and unexpected weight change.  HENT:  Negative for congestion, nosebleeds, sneezing, sore throat and trouble swallowing.   Eyes:  Negative for itching and visual disturbance.  Respiratory:  Negative for cough.   Cardiovascular:  Negative for chest pain, palpitations and leg swelling.  Gastrointestinal:  Positive for abdominal pain. Negative for abdominal distention, blood in stool, diarrhea, nausea and vomiting.  Genitourinary:  Negative for frequency and hematuria.  Musculoskeletal:  Positive for back pain. Negative for gait problem, joint swelling and neck pain.  Skin:  Negative for rash.  Neurological:  Negative for dizziness, tremors, speech difficulty and weakness.  Psychiatric/Behavioral:  Negative for agitation, dysphoric mood and sleep disturbance. The patient is not nervous/anxious.     Objective:  BP 120/62 (BP Location: Left Arm)   Pulse 67   Temp 98.1 F (36.7 C) (Oral)  Ht '6\' 2"'$  (1.88 m)   Wt 191 lb (86.6 kg)   SpO2 96%   BMI 24.52 kg/m   BP Readings from Last 3 Encounters:  05/27/22 120/62  03/10/22 138/70  12/09/21 110/62    Wt Readings from Last 3 Encounters:  05/27/22 191 lb (86.6 kg)  03/10/22 187 lb 12.8 oz (85.2 kg)  12/09/21 185 lb (83.9 kg)    Physical Exam Constitutional:      General: He is not in acute distress.    Appearance: He is well-developed.     Comments: NAD  Eyes:     Conjunctiva/sclera: Conjunctivae normal.     Pupils: Pupils are equal, round, and reactive to light.  Neck:     Thyroid: No thyromegaly.     Vascular: No JVD.  Cardiovascular:     Rate and Rhythm:  Normal rate and regular rhythm.     Heart sounds: Normal heart sounds. No murmur heard.    No friction rub. No gallop.  Pulmonary:     Effort: Pulmonary effort is normal. No respiratory distress.     Breath sounds: Normal breath sounds. No wheezing or rales.  Chest:     Chest wall: No tenderness.  Abdominal:     General: Bowel sounds are normal. There is no distension.     Palpations: Abdomen is soft. There is no mass.     Tenderness: There is no abdominal tenderness. There is no guarding or rebound.  Musculoskeletal:        General: No tenderness. Normal range of motion.     Cervical back: Normal range of motion.  Lymphadenopathy:     Cervical: No cervical adenopathy.  Skin:    General: Skin is warm and dry.     Findings: No rash.  Neurological:     Mental Status: He is alert and oriented to person, place, and time.     Cranial Nerves: No cranial nerve deficit.     Motor: No abnormal muscle tone.     Coordination: Coordination normal.     Gait: Gait normal.     Deep Tendon Reflexes: Reflexes are normal and symmetric.  Psychiatric:        Behavior: Behavior normal.        Thought Content: Thought content normal.        Judgment: Judgment normal.   B wax   Procedure Note :     Procedure :  Ear irrigation right and left ears   Indication:  Cerumen impaction right and left ears   Risks, including pain, dizziness, eardrum perforation, bleeding, infection and others as well as benefits were explained to the patient in detail. Verbal consent was obtained and the patient agreed to proceed.    We used "The Elephant Ear Irrigation Device" filled with lukewarm water for irrigation. A large amount wax was recovered from both ears. Procedure has also required manual wax removal/instrumentation with an ear wax curette and ear forceps on the right and left ears.   Tolerated well. Complications: None.   Postprocedure instructions :  Call if problems.   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 EXAM: ORTHOPANTOGRAM/PANORAMIC COMPARISON:  None. FINDINGS: Prior extraction of tooth number 1, 16, 17, 18, 32. Caries in tooth destruction of tooth 5. No lytic lesions. Visualized portions of the paranasal sinuses are unremarkable. IMPRESSION: Caries of tooth 5.  Prior extractions. Electronically Signed   By: Nolon Nations M.D.   On: 08/04/2021 08:59    Assessment & Plan:   Problem List Items Addressed This Visit     Flank pain - Primary    ?etiology UA, CBC, CMET Abd CT ordered      Relevant Orders   CT Abdomen Pelvis W Contrast   CBC with Differential/Platelet   Comprehensive metabolic panel   Urinalysis   Cerumen impaction    See procedure      Abdominal pain   Relevant Orders   CT Abdomen Pelvis W Contrast   CBC with Differential/Platelet   Comprehensive metabolic panel   Urinalysis      No orders of the defined types were placed in this encounter.     Follow-up: No follow-ups on file.  [name], MD

## 2022-05-27 NOTE — Assessment & Plan Note (Signed)
See procedure 

## 2022-05-31 ENCOUNTER — Other Ambulatory Visit: Payer: Self-pay | Admitting: Internal Medicine

## 2022-06-03 ENCOUNTER — Ambulatory Visit
Admission: RE | Admit: 2022-06-03 | Discharge: 2022-06-03 | Disposition: A | Discharge status: Home / Self Care | Payer: Medicare HMO | Source: Ambulatory Visit | Attending: Internal Medicine | Admitting: Internal Medicine

## 2022-06-03 DIAGNOSIS — N281 Cyst of kidney, acquired: Secondary | ICD-10-CM | POA: Diagnosis not present

## 2022-06-03 DIAGNOSIS — R109 Unspecified abdominal pain: Secondary | ICD-10-CM | POA: Insufficient documentation

## 2022-06-03 DIAGNOSIS — R1012 Left upper quadrant pain: Secondary | ICD-10-CM | POA: Diagnosis not present

## 2022-06-03 MED ORDER — IOHEXOL 300 MG/ML  SOLN
100.0000 mL | Freq: Once | INTRAMUSCULAR | Status: AC | PRN
  Administered 2022-06-03: 100 mL via INTRAVENOUS

## 2022-06-09 ENCOUNTER — Ambulatory Visit: Payer: Medicare HMO | Admitting: Internal Medicine

## 2022-06-12 ENCOUNTER — Ambulatory Visit: Payer: Medicare HMO | Admitting: Internal Medicine

## 2022-07-27 DIAGNOSIS — D485 Neoplasm of uncertain behavior of skin: Secondary | ICD-10-CM | POA: Diagnosis not present

## 2022-07-27 DIAGNOSIS — D044 Carcinoma in situ of skin of scalp and neck: Secondary | ICD-10-CM | POA: Diagnosis not present

## 2022-08-03 ENCOUNTER — Encounter: Payer: Self-pay | Admitting: Internal Medicine

## 2022-08-08 ENCOUNTER — Other Ambulatory Visit: Payer: Self-pay | Admitting: Internal Medicine

## 2022-08-26 ENCOUNTER — Encounter: Payer: Self-pay | Admitting: Internal Medicine

## 2022-08-26 ENCOUNTER — Ambulatory Visit (INDEPENDENT_AMBULATORY_CARE_PROVIDER_SITE_OTHER): Payer: Medicare HMO

## 2022-08-26 ENCOUNTER — Ambulatory Visit (INDEPENDENT_AMBULATORY_CARE_PROVIDER_SITE_OTHER): Payer: Medicare HMO | Admitting: Internal Medicine

## 2022-08-26 VITALS — BP 118/60 | HR 65 | Temp 98.2°F | Ht 74.0 in | Wt 186.2 lb

## 2022-08-26 DIAGNOSIS — R5383 Other fatigue: Secondary | ICD-10-CM

## 2022-08-26 DIAGNOSIS — R296 Repeated falls: Secondary | ICD-10-CM | POA: Diagnosis not present

## 2022-08-26 DIAGNOSIS — R200 Anesthesia of skin: Secondary | ICD-10-CM | POA: Diagnosis not present

## 2022-08-26 DIAGNOSIS — M5416 Radiculopathy, lumbar region: Secondary | ICD-10-CM

## 2022-08-26 DIAGNOSIS — R202 Paresthesia of skin: Secondary | ICD-10-CM | POA: Diagnosis not present

## 2022-08-26 DIAGNOSIS — Z0000 Encounter for general adult medical examination without abnormal findings: Secondary | ICD-10-CM | POA: Diagnosis not present

## 2022-08-26 DIAGNOSIS — I69354 Hemiplegia and hemiparesis following cerebral infarction affecting left non-dominant side: Secondary | ICD-10-CM

## 2022-08-26 DIAGNOSIS — W1800XA Striking against unspecified object with subsequent fall, initial encounter: Secondary | ICD-10-CM

## 2022-08-26 LAB — COMPREHENSIVE METABOLIC PANEL
ALT: 14 U/L (ref 0–53)
AST: 20 U/L (ref 0–37)
Albumin: 4.2 g/dL (ref 3.5–5.2)
Alkaline Phosphatase: 86 U/L (ref 39–117)
BUN: 21 mg/dL (ref 6–23)
CO2: 28 mEq/L (ref 19–32)
Calcium: 9.5 mg/dL (ref 8.4–10.5)
Chloride: 105 mEq/L (ref 96–112)
Creatinine, Ser: 1.05 mg/dL (ref 0.40–1.50)
GFR: 67.45 mL/min (ref >60.00)
Glucose, Bld: 112 mg/dL — ABNORMAL HIGH (ref 70–99)
Potassium: 4 mEq/L (ref 3.5–5.1)
Sodium: 141 mEq/L (ref 135–145)
Total Bilirubin: 0.9 mg/dL (ref 0.2–1.2)
Total Protein: 7.1 g/dL (ref 6.0–8.3)

## 2022-08-26 LAB — VITAMIN B12: Vitamin B-12: 1190 pg/mL — ABNORMAL HIGH (ref 211–911)

## 2022-08-26 LAB — TSH: TSH: 1.11 u[IU]/mL (ref 0.35–5.50)

## 2022-08-26 LAB — HEMOGLOBIN A1C: Hgb A1c MFr Bld: 6.2 % (ref 4.6–6.5)

## 2022-08-26 MED ORDER — HYDROXYZINE HCL 25 MG PO TABS: ORAL_TABLET | ORAL | 1 refills | Status: DC

## 2022-08-26 NOTE — Patient Instructions (Signed)
Benadryl, Hydroxyzine, Gabapentin can make you tired

## 2022-08-26 NOTE — Progress Notes (Signed)
Subjective:  Patient ID: Francis Sims., male    DOB: 1941-02-16  Age: 82 y.o. MRN: [id number]  CC: Follow-up   HPI Finch Pfuhl. presents for wt loss, itching, grief H/o CVA C/o fatigue LLE is numb from knee down x months, progressing  Outpatient Medications Prior to Visit  Medication Sig Dispense Refill   amLODipine (NORVASC) 2.5 MG tablet TAKE 1 TABLET BY MOUTH EVERY DAY 90 tablet 2   cholecalciferol (VITAMIN D) 1000 UNITS tablet Take 1,000 Units by mouth daily.     clopidogrel (PLAVIX) 75 MG tablet TAKE 1 TABLET BY MOUTH EVERY DAY 90 tablet 3   gabapentin (NEURONTIN) 100 MG capsule TAKE 1 CAPSULE (100 MG TOTAL) BY MOUTH THREE TIMES DAILY. 90 capsule 3   hydroxypropyl methylcellulose / hypromellose (ISOPTO TEARS / GONIOVISC) 2.5 % ophthalmic solution Place 1 drop into both eyes 3 (three) times daily as needed for dry eyes.     LORazepam (ATIVAN) 0.5 MG tablet Take 1 tablet (0.5 mg total) by mouth 2 (two) times daily as needed for anxiety. 60 tablet 2   losartan (COZAAR) 50 MG tablet TAKE 1 TABLET BY MOUTH DAILY 90 tablet 2   metFORMIN (GLUCOPHAGE) 500 MG tablet TAKE 1 TABLET BY MOUTH EVERY DAY WITH BREAKFAST 90 tablet 3   Omega-3 Fatty Acids (FISH OIL) 1000 MG CAPS Take 1,000 mg by mouth 2 (two) times daily.     oxybutynin (DITROPAN) 5 MG tablet TAKE 1 TABLET EVERY 8 HOURS AS NEED FOR BLADDER SPASMS (TO PREVENT URINARY SYMPTOMS WHEN YOU GO OUT) 270 tablet 1   pantoprazole (PROTONIX) 40 MG tablet TAKE 1 TABLET BY MOUTH EVERY DAY 90 tablet 3   SENEXON-S 8.6-50 MG tablet TAKE 1 TO 2 TABLETS BY MOUTH DAILY 100 tablet 3   tamsulosin (FLOMAX) 0.4 MG CAPS capsule Take 1 capsule (0.4 mg total) by mouth daily. 90 capsule 3   tolterodine (DETROL LA) 4 MG 24 hr capsule TAKE 1 CAPSULE BY MOUTH EVERY DAY 30 capsule 5   valACYclovir (VALTREX) 500 MG tablet TAKE 1 TABLET BY MOUTH TWICE A DAY FOR CANCER SORES 180 tablet 1   vitamin C (ASCORBIC ACID) 250 MG tablet Take 250 mg by mouth  daily.     hydrOXYzine (ATARAX) 25 MG tablet TAKE 1 TABLET BY MOUTH AT BEDTIME AS NEEDED FOR ITCHING. 90 tablet 1   megestrol (MEGACE) 40 MG tablet TAKE 1 TABLET BY MOUTH EVERY DAY 90 tablet 1   No facility-administered medications prior to visit.    ROS: Review of Systems  Constitutional:  Positive for fatigue. Negative for appetite change and unexpected weight change.  HENT:  Negative for congestion, nosebleeds, sneezing, sore throat and trouble swallowing.   Eyes:  Negative for itching and visual disturbance.  Respiratory:  Negative for cough.   Cardiovascular:  Negative for chest pain, palpitations and leg swelling.  Gastrointestinal:  Negative for abdominal distention, blood in stool, diarrhea and nausea.  Genitourinary:  Negative for frequency and hematuria.  Musculoskeletal:  Positive for gait problem. Negative for back pain, joint swelling and neck pain.  Skin:  Negative for rash.  Neurological:  Positive for weakness. Negative for dizziness, tremors and speech difficulty.  Psychiatric/Behavioral:  Positive for dysphoric mood. Negative for agitation, sleep disturbance and suicidal ideas. The patient is not nervous/anxious.     Objective:  There were no vitals taken for this visit.  BP Readings from Last 3 Encounters:  08/26/22 118/60  05/27/22 120/62  03/10/22  138/70    Wt Readings from Last 3 Encounters:  08/26/22 186 lb 3.2 oz (84.5 kg)  05/27/22 191 lb (86.6 kg)  03/10/22 187 lb 12.8 oz (85.2 kg)    Physical Exam Constitutional:      General: He is not in acute distress.    Appearance: Normal appearance. He is well-developed.     Comments: NAD  Eyes:     Conjunctiva/sclera: Conjunctivae normal.     Pupils: Pupils are equal, round, and reactive to light.  Neck:     Thyroid: No thyromegaly.     Vascular: No JVD.  Cardiovascular:     Rate and Rhythm: Normal rate and regular rhythm.     Heart sounds: Normal heart sounds. No murmur heard.    No friction rub.  No gallop.  Pulmonary:     Effort: Pulmonary effort is normal. No respiratory distress.     Breath sounds: Normal breath sounds. No wheezing or rales.  Chest:     Chest wall: No tenderness.  Abdominal:     General: Bowel sounds are normal. There is no distension.     Palpations: Abdomen is soft. There is no mass.     Tenderness: There is no abdominal tenderness. There is no guarding or rebound.  Musculoskeletal:        General: No tenderness. Normal range of motion.     Cervical back: Normal range of motion.     Right lower leg: No edema.     Left lower leg: No edema.  Lymphadenopathy:     Cervical: No cervical adenopathy.  Skin:    General: Skin is warm and dry.     Findings: No rash.  Neurological:     Mental Status: He is alert and oriented to person, place, and time.     Cranial Nerves: No cranial nerve deficit.     Motor: Weakness present. No abnormal muscle tone.     Coordination: Coordination abnormal.     Gait: Gait abnormal.     Deep Tendon Reflexes: Reflexes are normal and symmetric.  Psychiatric:        Behavior: Behavior normal.        Thought Content: Thought content normal.        Judgment: Judgment normal.   LLE no atrophy Decr sensation below L knee and down Ataxic, using a cane  Lab Results  Component Value Date   WBC 9.6 05/27/2022   HGB 14.7 05/27/2022   HCT 44.3 05/27/2022   PLT 278.0 05/27/2022   GLUCOSE 123 (H) 05/27/2022   CHOL 185 11/10/2019   TRIG 78 11/10/2019   HDL 34 (L) 11/10/2019   LDLDIRECT 105.0 05/31/2019   LDLCALC 135 (H) 11/10/2019   ALT 18 05/27/2022   AST 25 05/27/2022   NA 140 05/27/2022   K 4.4 05/27/2022   CL 102 05/27/2022   CREATININE 1.00 05/27/2022   BUN 22 05/27/2022   CO2 30 05/27/2022   TSH 1.14 01/16/2021   PSA 0.30 05/31/2019   INR 1.0 11/09/2019   HGBA1C 6.2 03/10/2022    CT Abdomen Pelvis W Contrast  Result Date: 06/05/2022 CLINICAL DATA:  Left flank pain EXAM: CT ABDOMEN AND PELVIS WITH CONTRAST  TECHNIQUE: Multidetector CT imaging of the abdomen and pelvis was performed using the standard protocol following bolus administration of intravenous contrast. RADIATION DOSE REDUCTION: This exam was performed according to the departmental dose-optimization program which includes automated exposure control, adjustment of the mA and/or kV according to patient size  and/or use of iterative reconstruction technique. CONTRAST:  142m OMNIPAQUE IOHEXOL 300 MG/ML  SOLN COMPARISON:  07/20/2017 CT; 01/30/2019 ultrasound FINDINGS: Lower chest: Right middle lobe pulmonary nodule with mean diameter of 5 mm (series 5, image 3). Included lung bases are otherwise clear. Heart size is normal. Hepatobiliary: No focal liver abnormality is seen. No gallstones, gallbladder wall thickening, or biliary dilatation. Pancreas: Unremarkable. No pancreatic ductal dilatation or surrounding inflammatory changes. Spleen: Normal in size without focal abnormality. Adrenals/Urinary Tract: Unremarkable adrenal glands. Kidneys enhance symmetrically. Small cysts within the left kidney are unchanged and do not require follow-up imaging. No renal stone or hydronephrosis. Bilateral ureters are within normal limits. Urinary bladder is within normal limits for the degree of distension. Stomach/Bowel: Stomach is within normal limits. Appendix appears normal. No evidence of bowel wall thickening, distention, or inflammatory changes. Vascular/Lymphatic: Scattered aortoiliac atherosclerotic calcifications without aneurysm. No abdominopelvic lymphadenopathy. Reproductive: Postsurgical changes to the prostate gland. Other: No free fluid. No abdominopelvic fluid collection. No pneumoperitoneum. Small fat containing right inguinal hernia. Musculoskeletal: No acute or significant osseous findings. Findings of diffuse idiopathic skeletal hyperostosis. Prior L3-L5 spinal fusion with decompression. IMPRESSION: 1. No acute abdominopelvic findings. Specifically, no  evidence of obstructive uropathy. 2. Right middle lobe pulmonary nodule with mean diameter of 5 mm. No follow-up needed if patient is low-risk.This recommendation follows the consensus statement: Guidelines for Management of Incidental Pulmonary Nodules Detected on CT Images: From the Fleischner Society 2017; Radiology 2017; 284:[id number]. 3. Aortic atherosclerosis (ICD10-I70.0). Electronically Signed   By: NDavina PokeD.O.   On: 06/05/2022 10:54    Assessment & Plan:   Problem List Items Addressed This Visit       Nervous and Auditory   Lumbar radiculitis    LLE is numb from knee down x months, progressing Neurol f/u      Relevant Medications   hydrOXYzine (ATARAX) 25 MG tablet   Hemiparesis affecting left side as late effect of cerebrovascular accident (CVA) ([place]    LLE is numb from knee down x months, progressing Neurol f/u      Relevant Orders   Ambulatory referral to Neurology     Other   Leg numbness - Primary    LLE is numb from knee down x months, progressing ?pressure neuropathy vs other. He sleeps in the recliner: prop legs Neurol ref Labs      Relevant Orders   Ambulatory referral to Neurology   Fatigue    Take Benadryl, Hydroxyzine, Gabapentin at hs (PRN)      Relevant Orders   Vitamin B12   TSH   Hemoglobin A1c   Comprehensive metabolic panel   Falls frequently    No hard falls      Fall against object    Fell yesterday. No injuries      Other Visit Diagnoses     Paresthesias       Relevant Orders   Vitamin B12   TSH   Hemoglobin A1c   Comprehensive metabolic panel         Meds ordered this encounter  Medications   hydrOXYzine (ATARAX) 25 MG tablet    Sig: TAKE 1 TABLET BY MOUTH AT BEDTIME AS NEEDED FOR ITCHING.    Dispense:  90 tablet    Refill:  1      Follow-up: Return in about 4 months (around 12/26/2022) for a follow-up visit.  AWalker Kehr MD

## 2022-08-26 NOTE — Assessment & Plan Note (Signed)
LLE is numb from knee down x months, progressing Neurol f/u

## 2022-08-26 NOTE — Patient Instructions (Addendum)
[name] , Thank you for taking time to come for your Medicare Wellness Visit. I appreciate your ongoing commitment to your health goals. Please review the following plan we discussed and let me know if I can assist you in the future.   These are the goals we discussed:  Goals      Patient Stated     After having my stroke, I have gotten my left arm to work again and I would love to get the strength back in my left leg.        This is a list of the screening recommended for you and due dates:  Health Maintenance  Topic Date Due   Hepatitis C Screening: USPSTF Recommendation to screen - Ages 80-79 yo.  Never done   Zoster (Shingles) Vaccine (1 of 2) Never done   COVID-19 Vaccine (3 - Pfizer risk series) 08/25/2019   DTaP/Tdap/Td vaccine (2 - Td or Tdap) 11/30/2022   Medicare Annual Wellness Visit  08/26/2023   Pneumonia Vaccine  Completed   HPV Vaccine  Aged Out   Flu Shot  Discontinued   Colon Cancer Screening  Discontinued    Advanced directives: Yes; Documents are on file.  Conditions/risks identified: Yes  Next appointment: Follow up in one year for your annual wellness visit.   Preventive Care 30 Years and Older, Male  Preventive care refers to lifestyle choices and visits with your health care provider that can promote health and wellness. What does preventive care include? A yearly physical exam. This is also called an annual well check. Dental exams once or twice a year. Routine eye exams. Ask your health care provider how often you should have your eyes checked. Personal lifestyle choices, including: Daily care of your teeth and gums. Regular physical activity. Eating a healthy diet. Avoiding tobacco and drug use. Limiting alcohol use. Practicing safe sex. Taking low doses of aspirin every day. Taking vitamin and mineral supplements as recommended by your health care provider. What happens during an annual well check? The services and screenings done by  your health care provider during your annual well check will depend on your age, overall health, lifestyle risk factors, and family history of disease. Counseling  Your health care provider may ask you questions about your: Alcohol use. Tobacco use. Drug use. Emotional well-being. Home and relationship well-being. Sexual activity. Eating habits. History of falls. Memory and ability to understand (cognition). Work and work Statistician. Screening  You may have the following tests or measurements: Height, weight, and BMI. Blood pressure. Lipid and cholesterol levels. These may be checked every 5 years, or more frequently if you are over 44 years old. Skin check. Lung cancer screening. You may have this screening every year starting at age 45 if you have a 30-pack-year history of smoking and currently smoke or have quit within the past 15 years. Fecal occult blood test (FOBT) of the stool. You may have this test every year starting at age 18. Flexible sigmoidoscopy or colonoscopy. You may have a sigmoidoscopy every 5 years or a colonoscopy every 10 years starting at age 44. Prostate cancer screening. Recommendations will vary depending on your family history and other risks. Hepatitis C blood test. Hepatitis B blood test. Sexually transmitted disease (STD) testing. Diabetes screening. This is done by checking your blood sugar (glucose) after you have not eaten for a while (fasting). You may have this done every 1-3 years. Abdominal aortic aneurysm (AAA) screening. You may need this if you are  a current or former smoker. Osteoporosis. You may be screened starting at age 43 if you are at high risk. Talk with your health care provider about your test results, treatment options, and if necessary, the need for more tests. Vaccines  Your health care provider may recommend certain vaccines, such as: Influenza vaccine. This is recommended every year. Tetanus, diphtheria, and acellular pertussis  (Tdap, Td) vaccine. You may need a Td booster every 10 years. Zoster vaccine. You may need this after age 49. Pneumococcal 13-valent conjugate (PCV13) vaccine. One dose is recommended after age 23. Pneumococcal polysaccharide (PPSV23) vaccine. One dose is recommended after age 56. Talk to your health care provider about which screenings and vaccines you need and how often you need them. This information is not intended to replace advice given to you by your health care provider. Make sure you discuss any questions you have with your health care provider. Document Released: 06/28/2015 Document Revised: 02/19/2016 Document Reviewed: 04/02/2015 Elsevier Interactive Patient Education  2017 [place] Prevention in the Home Falls can cause injuries. They can happen to people of all ages. There are many things you can do to make your home safe and to help prevent falls. What can I do on the outside of my home? Regularly fix the edges of walkways and driveways and fix any cracks. Remove anything that might make you trip as you walk through a door, such as a raised step or threshold. Trim any bushes or trees on the path to your home. Use bright outdoor lighting. Clear any walking paths of anything that might make someone trip, such as rocks or tools. Regularly check to see if handrails are loose or broken. Make sure that both sides of any steps have handrails. Any raised decks and porches should have guardrails on the edges. Have any leaves, snow, or ice cleared regularly. Use sand or salt on walking paths during winter. Clean up any spills in your garage right away. This includes oil or grease spills. What can I do in the bathroom? Use night lights. Install grab bars by the toilet and in the tub and shower. Do not use towel bars as grab bars. Use non-skid mats or decals in the tub or shower. If you need to sit down in the shower, use a plastic, non-slip stool. Keep the floor dry. Clean  up any water that spills on the floor as soon as it happens. Remove soap buildup in the tub or shower regularly. Attach bath mats securely with double-sided non-slip rug tape. Do not have throw rugs and other things on the floor that can make you trip. What can I do in the bedroom? Use night lights. Make sure that you have a light by your bed that is easy to reach. Do not use any sheets or blankets that are too big for your bed. They should not hang down onto the floor. Have a firm chair that has side arms. You can use this for support while you get dressed. Do not have throw rugs and other things on the floor that can make you trip. What can I do in the kitchen? Clean up any spills right away. Avoid walking on wet floors. Keep items that you use a lot in easy-to-reach places. If you need to reach something above you, use a strong step stool that has a grab bar. Keep electrical cords out of the way. Do not use floor polish or wax that makes floors slippery. If you must  use wax, use non-skid floor wax. Do not have throw rugs and other things on the floor that can make you trip. What can I do with my stairs? Do not leave any items on the stairs. Make sure that there are handrails on both sides of the stairs and use them. Fix handrails that are broken or loose. Make sure that handrails are as long as the stairways. Check any carpeting to make sure that it is firmly attached to the stairs. Fix any carpet that is loose or worn. Avoid having throw rugs at the top or bottom of the stairs. If you do have throw rugs, attach them to the floor with carpet tape. Make sure that you have a light switch at the top of the stairs and the bottom of the stairs. If you do not have them, ask someone to add them for you. What else can I do to help prevent falls? Wear shoes that: Do not have high heels. Have rubber bottoms. Are comfortable and fit you well. Are closed at the toe. Do not wear sandals. If you  use a stepladder: Make sure that it is fully opened. Do not climb a closed stepladder. Make sure that both sides of the stepladder are locked into place. Ask someone to hold it for you, if possible. Clearly mark and make sure that you can see: Any grab bars or handrails. First and last steps. Where the edge of each step is. Use tools that help you move around (mobility aids) if they are needed. These include: Canes. Walkers. Scooters. Crutches. Turn on the lights when you go into a dark area. Replace any light bulbs as soon as they burn out. Set up your furniture so you have a clear path. Avoid moving your furniture around. If any of your floors are uneven, fix them. If there are any pets around you, be aware of where they are. Review your medicines with your doctor. Some medicines can make you feel dizzy. This can increase your chance of falling. Ask your doctor what other things that you can do to help prevent falls. This information is not intended to replace advice given to you by your health care provider. Make sure you discuss any questions you have with your health care provider. Document Released: 03/28/2009 Document Revised: 11/07/2015 Document Reviewed: 07/06/2014 Elsevier Interactive Patient Education  2017 [employer].

## 2022-08-26 NOTE — Assessment & Plan Note (Signed)
No hard falls

## 2022-08-26 NOTE — Progress Notes (Addendum)
Subjective:   Francis Sims. is a 82 y.o. male who presents for Medicare Annual/Subsequent preventive examination.  Review of Systems     Cardiac Risk Factors include: advanced age (>29mn, >>29women);dyslipidemia;hypertension;male gender     Objective:    Today's Vitals   08/26/22 0942  BP: 118/60  Pulse: 65  Temp: 98.2 F (36.8 C)  TempSrc: Temporal  SpO2: 97%  Weight: 186 lb 3.2 oz (84.5 kg)  Height: '6\' 2"'$  (1.88 m)  PainSc: 0-No pain   Body mass index is 23.91 kg/m.     08/26/2022    9:48 AM 08/18/2021   10:21 AM 02/18/2021   12:00 PM 02/12/2021   11:10 AM 06/18/2020   11:22 AM 03/25/2020   12:18 PM 11/09/2019    9:52 AM  Advanced Directives  Does Patient Have a Medical Advance Directive? Yes Yes Yes No Yes No No  Type of [place] Living will;Healthcare Power of [place] will    Does patient want to make changes to medical advance directive? No - Patient declined No - Patient declined No - Patient declined  No - Patient declined    Copy of [place] Chart? Yes - validated most recent copy scanned in chart (See row information) Yes - validated most recent copy scanned in chart (See row information) Yes - validated most recent copy scanned in chart (See row information)  No - copy requested    Would patient like information on creating a medical advance directive?   No - Patient declined Yes (MAU/Ambulatory/Procedural Areas - Information given)  No - Patient declined No - Patient declined    Current Medications (verified) Outpatient Encounter Medications as of 08/26/2022  Medication Sig   amLODipine (NORVASC) 2.5 MG tablet TAKE 1 TABLET BY MOUTH EVERY DAY   cholecalciferol (VITAMIN D) 1000 UNITS tablet Take 1,000 Units by mouth daily.   clopidogrel (PLAVIX) 75 MG tablet TAKE 1 TABLET BY MOUTH EVERY DAY   gabapentin (NEURONTIN) 100 MG  capsule TAKE 1 CAPSULE (100 MG TOTAL) BY MOUTH THREE TIMES DAILY.   hydroxypropyl methylcellulose / hypromellose (ISOPTO TEARS / GONIOVISC) 2.5 % ophthalmic solution Place 1 drop into both eyes 3 (three) times daily as needed for dry eyes.   hydrOXYzine (ATARAX) 25 MG tablet TAKE 1 TABLET BY MOUTH AT BEDTIME AS NEEDED FOR ITCHING.   LORazepam (ATIVAN) 0.5 MG tablet Take 1 tablet (0.5 mg total) by mouth 2 (two) times daily as needed for anxiety.   losartan (COZAAR) 50 MG tablet TAKE 1 TABLET BY MOUTH DAILY   megestrol (MEGACE) 40 MG tablet TAKE 1 TABLET BY MOUTH EVERY DAY   metFORMIN (GLUCOPHAGE) 500 MG tablet TAKE 1 TABLET BY MOUTH EVERY DAY WITH BREAKFAST   Omega-3 Fatty Acids (FISH OIL) 1000 MG CAPS Take 1,000 mg by mouth 2 (two) times daily.   oxybutynin (DITROPAN) 5 MG tablet TAKE 1 TABLET EVERY 8 HOURS AS NEED FOR BLADDER SPASMS (TO PREVENT URINARY SYMPTOMS WHEN YOU GO OUT)   pantoprazole (PROTONIX) 40 MG tablet TAKE 1 TABLET BY MOUTH EVERY DAY   SENEXON-S 8.6-50 MG tablet TAKE 1 TO 2 TABLETS BY MOUTH DAILY   tamsulosin (FLOMAX) 0.4 MG CAPS capsule Take 1 capsule (0.4 mg total) by mouth daily.   tolterodine (DETROL LA) 4 MG 24 hr capsule TAKE 1 CAPSULE BY MOUTH EVERY DAY   valACYclovir (VALTREX) 500 MG tablet TAKE  1 TABLET BY MOUTH TWICE A DAY FOR CANCER SORES   vitamin C (ASCORBIC ACID) 250 MG tablet Take 250 mg by mouth daily.   No facility-administered encounter medications on file as of 08/26/2022.    Allergies (verified) Penicillins, Sulfonamide derivatives, Metoprolol, Hydroxyzine, Ibuprofen, and Statins   History: Past Medical History:  Diagnosis Date   Burping    per pt lots of burping   Chronic back pain    pinched nerve;buldging disc   Dry skin    on elbows;uses vasaline and it goes away   GERD (gastroesophageal reflux disease)    OTC   Hemorrhoids    Hx of colonic polyps    83yr ago   Hypertension    Nocturia    PONV (postoperative nausea and vomiting)    Stroke  ([place] 10/2019   Past Surgical History:  Procedure Laterality Date   ALVEOLOPLASTY Left 02/14/2021   Procedure: EXCISION OF LEFT MANDIBULAR ALVEOLAR CANCER; INCLUDING SECOND MOLAR;  Surgeon: RIzora Gala MD;  [facility]  Service: ENT;  Laterality: Left;   BACK SURGERY     BROW LIFT Right 07/12/2013   Procedure: CONTRACTURE  RELEASE ZPLASTY OF RIGHT EYE, PERIORBITAL AREA WITH REPAIR OF PTOSIS OF RIGHT EYE BROW;  Surgeon: CTheodoro Kos DO;  [facility]  Service: Plastics;  Laterality: Right;   CATARACT EXTRACTION  2008   bilateral   CERVICAL FUSION  2000   C5,6,7   COLONOSCOPY     EYE SURGERY     HERNIA REPAIR  1949   LUMBAR LAMINECTOMY/DECOMPRESSION MICRODISCECTOMY  05/26/2011   Procedure: LUMBAR LAMINECTOMY/DECOMPRESSION MICRODISCECTOMY;  Surgeon: ROlga CoasterKritzer;  [facility];  Service: Neurosurgery;  Laterality: Right;  Right Lumbar three-four Microdiskectomy   TONSILLECTOMY     Family History  Problem Relation Age of Onset   COPD Other    Anesthesia problems Neg Hx    Hypotension Neg Hx    Malignant hyperthermia Neg Hx    Pseudochol deficiency Neg Hx    Social History   Socioeconomic History   Marital status: Widowed    Spouse name: Not on file   Number of children: Not on file   Years of education: Not on file   Highest education level: Not on file  Occupational History   Occupation: retired CHydrologist Tobacco Use   Smoking status: Former   Smokeless tobacco: Never   Tobacco comments:    in the 60's quit  Substance and Sexual Activity   Alcohol use: No    Alcohol/week: 0.0 standard drinks of alcohol   Drug use: No   Sexual activity: Yes  Other Topics Concern   Not on file  Social History Narrative   Regular  Exercise-yes: tennis, work outs   Social Determinants of Health   Financial Resource Strain: [place] (08/26/2022)   Overall Financial Resource Strain ([place]    Difficulty of Paying Living Expenses: Not hard at all   Food Insecurity: [place](08/26/2022)   Hunger Vital Sign    Worried About Running Out of Food in the Last Year: Never true    [place] the Last Year: Never true  Transportation Needs: No Transportation Needs (08/26/2022)   PRAPARE - THydrologist(Medical): No    Lack of Transportation (Non-Medical): No  Physical Activity: Sufficiently Active (08/26/2022)   Exercise Vital Sign    Days of Exercise per Week: 5 days  Minutes of Exercise per Session: 30 min  Recent Concern: Physical Activity - Inactive (08/26/2022)   Exercise Vital Sign    Days of Exercise per Week: 0 days    Minutes of Exercise per Session: 30 min  Stress: No Stress Concern Present (08/26/2022)   [place]    Feeling of Stress : Not at all  Social Connections: Moderately Integrated (08/26/2022)   Social Connection and Isolation Panel [NHANES]    Frequency of Communication with Friends and Family: More than three times a week    Frequency of Social Gatherings with Friends and Family: Once a week    Attends Religious Services: More than 4 times per year    Active Member of [employer] or Organizations: Yes    Attends Archivist Meetings: More than 4 times per year    Marital Status: Widowed    Tobacco Counseling Counseling given: Not Answered Tobacco comments: in the 60's quit   Clinical Intake:  Pre-visit preparation completed: Yes  Pain : No/denies pain Pain Score: 0-No pain     BMI - recorded: 23.91 Nutritional Status: BMI of 19-24  Normal Nutritional Risks: None Diabetes: No  How often do you need to have someone help you when you read instructions, pamphlets, or other written materials from your doctor or pharmacy?: 1 - Never 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 No  Interpreter Needed?: No  Information entered by :: Lisette Abu,  LPN.   Activities of Daily Living    08/26/2022   10:11 AM  In your present state of health, do you have any difficulty performing the following activities:  Hearing? 1  Vision? 0  Difficulty concentrating or making decisions? 0  Walking or climbing stairs? 1  Dressing or bathing? 0  Doing errands, shopping? 0  Preparing Food and eating ? N  Using the Toilet? N  In the past six months, have you accidently leaked urine? N  Do you have problems with loss of bowel control? N  Managing your Medications? N  Managing your Finances? N  Housekeeping or managing your Housekeeping? N    Patient Care Team: Plotnikov, Evie Lacks, MD as PCP - General Kritzer, Louie Casa, MD as Attending Physician (Neurosurgery) [place], [place] as Consulting Physician (Optometry) Izora Gala, MD as Consulting Physician (Otolaryngology)  Indicate any recent Medical Services you may have received from other than [facility] providers in the past year (date may be approximate).     Assessment:   This is a routine wellness examination for Breken.  Hearing/Vision screen Hearing Screening - Comments:: Patient wears hearing aids. Vision Screening - Comments:: Wears rx glasses - up to date with routine eye exams with Eula Flax, OD.   Dietary issues and exercise activities discussed: Current Exercise Habits: Home exercise routine;Structured exercise class, Type of exercise: walking;treadmill;stretching;strength training/weights, Time (Minutes): 30, Frequency (Times/Week): 5, Weekly Exercise (Minutes/Week): 150, Intensity: Mild, Exercise limited by: cardiac condition(s);Other - see comments (Unsteady gait due to stroke)   Goals Addressed   None   Depression Screen    08/26/2022    9:47 AM 08/18/2021   10:20 AM 07/29/2021   10:44 AM 07/17/2020    9:33 AM 06/18/2020   11:20 AM 01/16/2020    2:32 PM 01/16/2020    2:31 PM  PHQ 2/9 Scores  PHQ - 2 Score 0 0 0 0 0 0 0  PHQ- 9 Score 0  3  0 0    Fall Risk     08/26/2022   10:10 AM 08/18/2021   10:22 AM 06/30/2021   10:13 AM 06/18/2020   11:23 AM 01/22/2020    9:03 AM  Fall Risk   Falls in the past year? 0 '1 1 1 1  '$ Number falls in past yr: 0 '1 1 1 1  '$ Injury with Fall? 0 1 1 0 0  Comment   fell on thursday left eye is bruised & left side of face    Risk for fall due to : Impaired balance/gait History of fall(s);Impaired balance/gait;Impaired mobility History of fall(s);Impaired balance/gait Impaired balance/gait;History of fall(s)   Follow up Falls evaluation completed Falls evaluation completed  Falls evaluation completed     [place]:  Any stairs in or around the home? No, lives in an independent retirement community (there is an Media planner) If so, are there any without handrails? No  Home free of loose throw rugs in walkways, pet beds, electrical cords, etc? Yes  Adequate lighting in your home to reduce risk of falls? Yes   ASSISTIVE DEVICES UTILIZED TO PREVENT FALLS:  Life alert? Yes Use of a cane, walker or w/c? Yes  Grab bars in the bathroom? Yes  Shower chair or bench in shower? Yes  Elevated toilet seat or a handicapped toilet? Yes   TIMED UP AND GO:  Was the test performed? Yes .  Length of time to ambulate 10 feet: 13 sec.   Gait slow and steady with assistive device  Cognitive Function:        08/26/2022    9:48 AM  6CIT Screen  What Year? 0 points  What month? 0 points  What time? 0 points  Count back from 20 0 points  Months in reverse 0 points  Repeat phrase 0 points  Total Score 0 points    Immunizations Immunization History  Administered Date(s) Administered   Influenza, High Dose Seasonal PF 03/12/2018, 02/14/2020   PFIZER(Purple Top)SARS-COV-2 Vaccination 07/07/2019, 07/28/2019   Pneumococcal Conjugate-13 05/05/2013   Pneumococcal Polysaccharide-23 04/24/2008   Tdap 11/29/2012   Zoster, Live 11/28/2010    TDAP status: Up to date  Flu Vaccine status: Declined,  Education has been provided regarding the importance of this vaccine but patient still declined. Advised may receive this vaccine at local pharmacy or Health Dept. Aware to provide a copy of the vaccination record if obtained from local pharmacy or Health Dept. Verbalized acceptance and understanding.  Pneumococcal vaccine status: Up to date  Covid-19 vaccine status: Completed vaccines  Qualifies for Shingles Vaccine? Yes   Zostavax completed Yes   Shingrix Completed?: No.    Education has been provided regarding the importance of this vaccine. Patient has been advised to call insurance company to determine out of pocket expense if they have not yet received this vaccine. Advised may also receive vaccine at local pharmacy or Health Dept. Verbalized acceptance and understanding.  Screening Tests Health Maintenance  Topic Date Due   Hepatitis C Screening  Never done   Zoster Vaccines- Shingrix (1 of 2) Never done   COVID-19 Vaccine (3 - Pfizer risk series) 08/25/2019   DTaP/Tdap/Td (2 - Td or Tdap) 11/30/2022   Medicare Annual Wellness (AWV)  08/26/2023   Pneumonia Vaccine 89+ Years old  Completed   HPV VACCINES  Aged Out   INFLUENZA VACCINE  Discontinued   COLONOSCOPY (Pts 45-41yr Insurance coverage will need to be confirmed)  Discontinued  Health Maintenance  Health Maintenance Due  Topic Date Due   Hepatitis C Screening  Never done   Zoster Vaccines- Shingrix (1 of 2) Never done   COVID-19 Vaccine (3 - Pfizer risk series) 08/25/2019    Colorectal cancer screening: No longer required.   Lung Cancer Screening: (Low Dose CT Chest recommended if Age 57-80 years, 30 pack-year currently smoking OR have quit w/in 15years.) does not qualify.   Lung Cancer Screening Referral: no  Additional Screening:  Hepatitis C Screening: does qualify; Completed: no  Vision Screening: Recommended annual ophthalmology exams for early detection of glaucoma and other disorders of the eye. Is  the patient up to date with their annual eye exam?  Yes  Who is the provider or what is the name of the office in which the patient attends annual eye exams? Eula Flax, OD. If pt is not established with a provider, would they like to be referred to a provider to establish care? No .   Dental Screening: Recommended annual dental exams for proper oral hygiene  Community Resource Referral / Chronic Care Management: CRR required this visit?  No   CCM required this visit?  No      Plan:     I have personally reviewed and noted the following in the patient's chart:   Medical and social history Use of alcohol, tobacco or illicit drugs  Current medications and supplements including opioid prescriptions. Patient is not currently taking opioid prescriptions. Functional ability and status Nutritional status Physical activity Advanced directives List of other physicians Hospitalizations, surgeries, and ER visits in previous 12 months Vitals Screenings to include cognitive, depression, and falls Referrals and appointments  In addition, I have reviewed and discussed with patient certain preventive protocols, quality metrics, and best practice recommendations. A written personalized care plan for preventive services as well as general preventive health recommendations were provided to patient.     Sheral Flow, LPN   [id number]   Nurse Notes:  Normal cognitive status assessed by direct observation by this Nurse Health Advisor. No abnormalities found.   Medical screening examination/treatment/procedure(s) were performed by non-physician practitioner and as supervising physician I was immediately available for consultation/collaboration.  I agree with above. Lew Dawes, MD

## 2022-08-26 NOTE — Assessment & Plan Note (Signed)
LLE is numb from knee down x months, progressing ?pressure neuropathy vs other. He sleeps in the recliner: prop legs Neurol ref Labs

## 2022-08-26 NOTE — Assessment & Plan Note (Signed)
Fell yesterday. No injuries

## 2022-08-26 NOTE — Assessment & Plan Note (Signed)
Take Benadryl, Hydroxyzine, Gabapentin at hs (PRN)

## 2022-09-09 DIAGNOSIS — H524 Presbyopia: Secondary | ICD-10-CM | POA: Diagnosis not present

## 2022-10-18 ENCOUNTER — Other Ambulatory Visit: Payer: Self-pay | Admitting: Internal Medicine

## 2022-11-02 ENCOUNTER — Ambulatory Visit: Payer: Medicare HMO | Admitting: Neurology

## 2022-11-02 ENCOUNTER — Encounter: Payer: Self-pay | Admitting: Neurology

## 2022-11-02 ENCOUNTER — Telehealth: Payer: Self-pay | Admitting: Neurology

## 2022-11-02 VITALS — BP 150/77 | HR 64 | Ht 74.0 in | Wt 185.0 lb

## 2022-11-02 DIAGNOSIS — Z9181 History of falling: Secondary | ICD-10-CM

## 2022-11-02 DIAGNOSIS — M5030 Other cervical disc degeneration, unspecified cervical region: Secondary | ICD-10-CM

## 2022-11-02 DIAGNOSIS — M4802 Spinal stenosis, cervical region: Secondary | ICD-10-CM

## 2022-11-02 DIAGNOSIS — M21372 Foot drop, left foot: Secondary | ICD-10-CM | POA: Diagnosis not present

## 2022-11-02 DIAGNOSIS — I6523 Occlusion and stenosis of bilateral carotid arteries: Secondary | ICD-10-CM

## 2022-11-02 DIAGNOSIS — I69354 Hemiplegia and hemiparesis following cerebral infarction affecting left non-dominant side: Secondary | ICD-10-CM

## 2022-11-02 NOTE — Patient Instructions (Signed)
ASSESSMENT AND PLAN 82 y.o. 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 The foot drop is most evident and increased the risk of falls. He already has a foot brace, which he feels is more impairing that beneficial.    2) progression of left foot drop, and  numbness. muscle atrophy in the left lower extremity. No circulation impairment, normal capillary refill. Left foot numbness, and  a tight feeling around the forefoot.    3) left arm and hand with impaired propioception, coordination.      I will offer a NCV and EMG for this patient lower extremities. He already has a foot brace, which he feels is more impairing that beneficial.  He had no recent  MRI brain and cervical spine but I doubt this is a new stroke, this seems to be all sequelae of the previous events, and could also related to spinal stenosis, cervical spine.   MRI ordered.    I also reccommended hiking poles to walk outside.          I plan to follow up  through our NP within 3-4  months.     Common Peroneal Nerve Entrapment  Common peroneal nerve entrapment is a condition that can make it hard to lift a foot. The condition results from pressure on a nerve in the lower leg called the common peroneal nerve. Your common peroneal nerve provides feeling to your outer lower leg and foot. It also supplies the muscles that move your foot and toes upward and outward. What are the causes? This condition may be caused by: Sitting cross-legged, squatting, or kneeling for long periods of time. A hard, direct hit to the outside of the lower leg. Swelling from a knee injury. A break or fracture in one of the lower leg bones. Wearing a boot or cast that ends just below the knee. A growth or cyst near the nerve. What increases the risk? This condition is more likely to develop in people who play: Contact sports, such as football or hockey. Sports where  you wear high and stiff boots, such as skiing. What are the signs or symptoms? Symptoms of this condition include: Trouble lifting your foot up (foot drop). Tripping often. Your foot hitting the ground harder than normal as you walk, resulting in a slapping-type sound. Numbness, tingling, or pain in the outside of the knee, outside of the lower leg, and top of the foot. Sensitivity to pressure on the front or outside of the leg. How is this diagnosed? This condition may be diagnosed based on: Your symptoms. Your medical history. A physical exam. Tests, such as: X-rays to check the bones of your knee and leg. MRI to check tendons that attach to the side of your knee. Ultrasound to check for a growth or cyst. Electromyogram (EMG) to check your nerves. During your physical exam, your health care provider will check for numbness in your leg and test the strength of your lower leg muscles. He or she may tap the side of your lower leg to see if that causes tingling. How is this treated? Treatment for this condition may include: Avoiding activities that make symptoms worse. Using a brace to hold up your foot and toes. Taking anti-inflammatory pain medicines to relieve swelling and lessen pain. Having medicines injected into your ankle joint to lessen pain and swelling. Doing exercises to help you regain or maintain movement (physical therapy).  Surgery to take pressure off the nerve. This may be needed if there is no improvement after 2-3 months or if there is a growth pushing on the nerve. Returning gradually to full activity. Follow these instructions at home: If you have a removable brace: Wear the brace as told by your health care provider. Remove it only as told by your health care provider. Check the skin around the brace every day. Tell your health care provider about any concerns. Loosen the brace if your toes tingle, become numb, or turn cold and blue. Keep the brace clean. If the  brace is not waterproof: Do not let it get wet. Cover it with a watertight covering when you take a bath or a shower. Activity Ask your health care provider when it is safe to drive if you have a brace on your foot. Do not do any activities that make pain or swelling worse. Do exercises as told by your health care provider. Return to your normal activities as told by your health care provider. Ask your health care provider what activities are safe for you. General instructions Take over-the-counter and prescription medicines only as told by your health care provider. Do not put your full weight on your knee until your health care provider says you can. Use crutches as directed by your health care provider. Keep all follow-up visits. This is important. How is this prevented? Wear supportive footwear that is appropriate for your athletic activity. Avoid athletic activities that cause ankle pain or swelling. Wear protective padding over your lower legs when playing contact sports. Make sure your boots do not put extra pressure on the area just below your knees. Do not sit cross-legged for long periods of time. Contact a health care provider if: Your symptoms do not get better in 2-3 months. The weakness or numbness in your leg or foot gets worse. Summary Common peroneal nerve entrapment is a condition that results from pressure on a nerve in the lower leg called the common peroneal nerve. This condition may be caused by a hard hit, swelling, a fracture, or a cyst in the lower leg. Treatment may include rest, a brace, medicines, and physical therapy. Sometimes surgery is needed. Do not do any activities that make pain or swelling worse. This information is not intended to replace advice given to you by your health care provider. Make sure you discuss any questions you have with your health care provider. Document Revised: 02/12/2021 Document Reviewed: 02/12/2021 Elsevier Patient Education   2023 [employer].

## 2022-11-02 NOTE — Progress Notes (Signed)
Provider:  Melvyn Novas, MD  Primary Care Physician:  Tresa Garter, MD [address] [place] [address]     Referring Provider: Tresa Garter, Md [address] [place],  [place] [address]          Chief Complaint according to patient   Patient presents with:     New Patient (Initial Visit)           HISTORY OF PRESENT ILLNESS:   11-02-2022:  Treylen Nigg. is a 82 y.o. male patient who is here for revisit 11/02/2022 for  foot problem.  Chief concern according to patient :  This patient reports weakness in left foot,  the foot feels swollen and tight. He had 2 lower back surgeries 05-2011, 2/ 2013 and recovered well from that, had cervical anterior fusion in 2000. He is frustrated by not being able to play pickle ball, tennis or golf anymore. At home he is furniture surfing. He has had a bilateral artery stenosis - developed a stroke, was 16 days in hospital, at [facility]. My of 2021.  He lost 40 pounds, he lost muscle> he reports that his son [name] / PTSD ) committed suicide, wife as dx with ovarian cancer and died within 16 months. He reports several falls since 2020.  He  reports his foot numbness is ascending.  Here to check this out.   CT angiogram: 11-09-2019.  Aortic arch: Mild calcified plaque along the arch and patent great vessel origins.   Right carotid system: Patent. There is atherosclerotic irregularity of the common carotid. Calcified and noncalcified plaque at the ICA origin causing less than 50% stenosis. Probable small area of ulcerated plaque at the common carotid bifurcation. There is also additional mild atherosclerotic irregularity along remainder of the cervical ICA.   Left carotid system: Patent. Eccentric noncalcified plaque along the common carotid. Primarily noncalcified plaque at the ICA origin causing approximately 50% stenosis.   MRI brain 10-2019: Left arm numbness, left leg weakness,   right facial droop. IMPRESSION: Small acute infarct in the region of the posterior limb of the right internal capsule.   MRI brain 03-25-2020 : No acute intracranial abnormality. 2. Generalized atrophy and findings of chronic small vessel ischemic disease.  CT head ; Fall against object. Concussion without loss of consciousness, initial encounter. Head trauma, moderate/severe. Neck trauma. Additional history provided: Patient reports slipping and falling in driveway last Thursday (hitting head). Bruising to left eye and cheek bone. Patient on Eliquis.   and cervical spine, 1 -2023.  cerebral and cerebellar atrophy. Redemonstrated prominent perivascular spaces within the basal ganglia and left subinsular white matter. Mild patchy hypoattenuation within the cerebral white matter, nonspecific but compatible with chronic small vessel ischemic disease. There is no acute intracranial hemorrhage. No demarcated cortical infarct.   CT neck : vRedemonstrated sequela of prior C5-C7 ACDF. No evidence of acute hardware compromise.   Cervical spondylosis with multilevel disc space narrowing, disc bulges/central disc protrusions, posterior disc osteophytes, endplate spurring, uncovertebral hypertrophy and facet arthrosis. No appreciable high-grade spinal canal stenosis. Multilevel bony neural foraminal narrowing. At C2-C3, disc degeneration is advanced and there is early osseous fusion across the disc space.     Sleep relevant medical history: DOAdmission at [facility]: 11/09/[facility] PCP: Tresa Garter, MD   Brief History   82 year old male with history of GERD, situational mixed anxiety and depression, elevated blood pressure, prediabetes and BPH presented to  the ED with 1 day history of left-sided weakness and numbness.  Reportedly has had 4 episodes of falls in the past 6 months without loss of consciousness or any injury. MRI done in the ED showed acute small infarct in the posterior limb  of the right internal capsule.  Patient outside the window for TPA and did not receive TPA.  Admitted for further management.   As the patient is outside the 24-38 hour window, hydralazine has been ordered q8hr to address blood pressures. Echocardiogram demonstrates 60-65% ejection fraction with normal LV function. There are no regional wall motion abnormalities. There is mild left ventricular hypertrophy. LV diastolic parameters are indeterminate. RV systolic function and size are normal. There is no atrial level shunt detected by color flow doppler. CTA head and neck has demonstrated severe and multifocal stenosis along the right posterior cerebral artery including severe stenosis near the right P1-P2 junction. CTA neck demonstrated less than 50% stenosis of the right ICA with a small area of ulcerated plaque at the common carotid bifurcation. There is also additional mild atherosclerotic irregularity along the remainder of the cervical ICA. Lt carotid system demonstrated an eccentric non-calcified plaque along the common carotid causing approximately 50% stenosis. Vertebral arteries are patent.   Neurology has recommended statin for lipid management, PT/OT/SLP eval, dual antiplatelet treatment for 3 weeks with change from ASA 81 mg daily and Plavix 75 mg daily to Plavix only after 3 weeks, and target BP less than 140/80. Unfortunately the patient is intolerant of statins. The patient was to follow up outpatient at discharge.         Review of Systems: Out of a complete 14 system review, the patient complains of only the following symptoms, and all other reviewed systems are negative.:   Right hemiparesis, with spasticity, up-going toe and loss of muscle mass.    Social History   Socioeconomic History   Marital status: Widowed    Spouse name: Not on file   Number of children: Not on file   Years of education: Not on file   Highest education level: Not on file  Occupational History    Occupation: retired Building services engineer  Tobacco Use   Smoking status: Former   Smokeless tobacco: Never   Tobacco comments:    in the 60's quit  Substance and Sexual Activity   Alcohol use: No    Alcohol/week: 0.0 standard drinks of alcohol   Drug use: No   Sexual activity: Yes  Other Topics Concern   Not on file  Social History Narrative   Regular  Exercise-yes: tennis, work outs   Social Determinants of Health   Financial Resource Strain: Low Risk  (08/26/2022)   Overall Financial Resource Strain (CARDIA)    Difficulty of Paying Living Expenses: Not hard at all  Food Insecurity: No Food Insecurity (08/26/2022)   Hunger Vital Sign    Worried About Running Out of Food in the Last Year: Never true    Ran Out of Food in the Last Year: Never true  Transportation Needs: No Transportation Needs (08/26/2022)   PRAPARE - Administrator, Civil Service (Medical): No    Lack of Transportation (Non-Medical): No  Physical Activity: Sufficiently Active (08/26/2022)   Exercise Vital Sign    Days of Exercise per Week: 5 days    Minutes of Exercise per Session: 30 min  Recent Concern: Physical Activity - Inactive (08/26/2022)   Exercise Vital Sign    Days of Exercise per Week: 0  days    Minutes of Exercise per Session: 30 min  Stress: No Stress Concern Present (08/26/2022)   [employer] of Occupational Health - Occupational Stress Questionnaire    Feeling of Stress : Not at all  Social Connections: Moderately Integrated (08/26/2022)   Social Connection and Isolation Panel [NHANES]    Frequency of Communication with Friends and Family: More than three times a week    Frequency of Social Gatherings with Friends and Family: Once a week    Attends Religious Services: More than 4 times per year    Active Member of [employer] or Organizations: Yes    Attends Banker Meetings: More than 4 times per year    Marital Status: Widowed    Family History  Problem Relation Age of Onset   COPD  Other    Anesthesia problems Neg Hx    Hypotension Neg Hx    Malignant hyperthermia Neg Hx    Pseudochol deficiency Neg Hx     Past Medical History:  Diagnosis Date   Burping    per pt lots of burping   Chronic back pain    pinched nerve;buldging disc   Dry skin    on elbows;uses vasaline and it goes away   GERD (gastroesophageal reflux disease)    OTC   Hemorrhoids    Hx of colonic polyps    32yrs ago   Hypertension    Nocturia    PONV (postoperative nausea and vomiting)    Stroke (HCC) 10/2019    Past Surgical History:  Procedure Laterality Date   ALVEOLOPLASTY Left 02/14/2021   Procedure: EXCISION OF LEFT MANDIBULAR ALVEOLAR CANCER; INCLUDING SECOND MOLAR;  Surgeon: Serena Colonel, MD;  Location: [facility] OR;  Service: ENT;  Laterality: Left;   BACK SURGERY     BROW LIFT Right 07/12/2013   Procedure: CONTRACTURE  RELEASE ZPLASTY OF RIGHT EYE, PERIORBITAL AREA WITH REPAIR OF PTOSIS OF RIGHT EYE BROW;  Surgeon: Wayland Denis, DO;  Location: [facility];  Service: Plastics;  Laterality: Right;   CATARACT EXTRACTION  2008   bilateral   CERVICAL FUSION  2000   C5,6,7   COLONOSCOPY     EYE SURGERY     HERNIA REPAIR  1949   LUMBAR LAMINECTOMY/DECOMPRESSION MICRODISCECTOMY  05/26/2011   Procedure: LUMBAR LAMINECTOMY/DECOMPRESSION MICRODISCECTOMY;  Surgeon: Rolanda Lundborg Kritzer;  Location: [facility];  Service: Neurosurgery;  Laterality: Right;  Right Lumbar three-four Microdiskectomy   TONSILLECTOMY       Current Outpatient Medications on File Prior to Visit  Medication Sig Dispense Refill   amLODipine (NORVASC) 2.5 MG tablet TAKE 1 TABLET BY MOUTH EVERY DAY 90 tablet 2   cholecalciferol (VITAMIN D) 1000 UNITS tablet Take 1,000 Units by mouth daily.     clopidogrel (PLAVIX) 75 MG tablet TAKE 1 TABLET BY MOUTH EVERY DAY 90 tablet 3   gabapentin (NEURONTIN) 100 MG capsule TAKE 1 CAPSULE (100 MG TOTAL) BY MOUTH THREE TIMES DAILY. 90 capsule 3   hydroxypropyl methylcellulose  / hypromellose (ISOPTO TEARS / GONIOVISC) 2.5 % ophthalmic solution Place 1 drop into both eyes 3 (three) times daily as needed for dry eyes.     hydrOXYzine (ATARAX) 25 MG tablet TAKE 1 TABLET BY MOUTH AT BEDTIME AS NEEDED FOR ITCHING. 90 tablet 1   LORazepam (ATIVAN) 0.5 MG tablet Take 1 tablet (0.5 mg total) by mouth 2 (two) times daily as needed for anxiety. 60 tablet 2   losartan (COZAAR) 50 MG tablet TAKE  1 TABLET BY MOUTH DAILY 90 tablet 2   metFORMIN (GLUCOPHAGE) 500 MG tablet TAKE 1 TABLET BY MOUTH EVERY DAY WITH BREAKFAST 90 tablet 3   Omega-3 Fatty Acids (FISH OIL) 1000 MG CAPS Take 1,000 mg by mouth 2 (two) times daily.     oxybutynin (DITROPAN) 5 MG tablet TAKE 1 TABLET EVERY 8 HOURS AS NEED FOR BLADDER SPASMS (TO PREVENT URINARY SYMPTOMS WHEN YOU GO OUT) 270 tablet 1   pantoprazole (PROTONIX) 40 MG tablet TAKE 1 TABLET BY MOUTH EVERY DAY 90 tablet 3   SENEXON-S 8.6-50 MG tablet TAKE 1 TO 2 TABLETS BY MOUTH DAILY 100 tablet 3   tamsulosin (FLOMAX) 0.4 MG CAPS capsule Take 1 capsule (0.4 mg total) by mouth daily. 90 capsule 3   tolterodine (DETROL LA) 4 MG 24 hr capsule TAKE 1 CAPSULE BY MOUTH EVERY DAY 30 capsule 5   valACYclovir (VALTREX) 500 MG tablet TAKE 1 TABLET BY MOUTH TWICE A DAY FOR CANCER SORES 180 tablet 1   vitamin C (ASCORBIC ACID) 250 MG tablet Take 250 mg by mouth daily.     No current facility-administered medications on file prior to visit.    Allergies  Allergen Reactions   Penicillins Hives and Swelling   Sulfonamide Derivatives Hives    Almost stopped heart in the service..Also, causes hives   Metoprolol     fatigue   Hydroxyzine     Too strong   Ibuprofen Itching    Pt states he can take ibuprofen   Statins     aches     DIAGNOSTIC DATA (LABS, IMAGING, TESTING) - I reviewed patient records, labs, notes, testing and imaging myself where available.  Lab Results  Component Value Date   WBC 9.6 05/27/2022   HGB 14.7 05/27/2022   HCT 44.3 05/27/2022    MCV 83.1 05/27/2022   PLT 278.0 05/27/2022      Component Value Date/Time   NA 141 08/26/2022 1118   K 4.0 08/26/2022 1118   CL 105 08/26/2022 1118   CO2 28 08/26/2022 1118   GLUCOSE 112 (H) 08/26/2022 1118   BUN 21 08/26/2022 1118   CREATININE 1.05 08/26/2022 1118   CALCIUM 9.5 08/26/2022 1118   PROT 7.1 08/26/2022 1118   ALBUMIN 4.2 08/26/2022 1118   AST 20 08/26/2022 1118   ALT 14 08/26/2022 1118   ALKPHOS 86 08/26/2022 1118   BILITOT 0.9 08/26/2022 1118   GFRNONAA >60 02/21/2021 0333   GFRAA >60 11/23/2019 0554   Lab Results  Component Value Date   CHOL 185 11/10/2019   HDL 34 (L) 11/10/2019   LDLCALC 135 (H) 11/10/2019   LDLDIRECT 105.0 05/31/2019   TRIG 78 11/10/2019   CHOLHDL 5.4 11/10/2019   Lab Results  Component Value Date   HGBA1C 6.2 08/26/2022   Lab Results  Component Value Date   VITAMINB12 1,190 (H) 08/26/2022   Lab Results  Component Value Date   TSH 1.11 08/26/2022    PHYSICAL EXAM:  Today's Vitals   11/02/22 1408  BP: (!) 150/77  Pulse: 64  Weight: 185 lb (83.9 kg)  Height: 6\' 2"  (1.88 m)   Body mass index is 23.75 kg/m.   Wt Readings from Last 3 Encounters:  11/02/22 185 lb (83.9 kg)  08/26/22 186 lb 3.2 oz (84.5 kg)  05/27/22 191 lb (86.6 kg)     Ht Readings from Last 3 Encounters:  11/02/22 6\' 2"  (1.88 m)  08/26/22 6\' 2"  (1.88 m)  05/27/22 6\' 2"  (  1.88 m)      General: The patient is awake, alert and appears not in acute distress. The patient is well groomed. He was 225 pounds at the time of his son's suciide followed by wife's cancer diagnosed. Now 193 pounds.  Head: Normocephalic, atraumatic. Neck is supple. Mallampati; 2,  neck circumference:16 inches . Nasal airflow patent.   Retrognathia is not  seen.  Dental status:  Cardiovascular:  Regular rate and cardiac rhythm by pulse,  without distendd neck veins. Respiratory: Lungs are clear to auscultation.  Skin:  Without evidence of ankle edema, or rash. Trunk: The  patient's posture is erect.   Neurologic exam : The patient is awake and alert, oriented to place and time.   Memory subjective described as intact.  Attention span & concentration ability appears normal.  Speech is fluent,  with  dysarthria, with dysphonia.  Mood and affect are appropriate.   Cranial nerves: no loss of smell or taste reported  Pupils are equal and briskly reactive to light. Funduscopic exam  deferred.  Extraocular movements in vertical and horizontal planes were intact and without nystagmus. No Diplopia. Visual fields by finger perimetry are intact. Hearing was intact to soft voice and finger rubbing.    Facial sensation intact to fine touch.  Facial motor strength is symmetric and tongue elavated higher on his right-  and uvula move midline.  Neck ROM : rotation, tilt and flexion extension were normal for age and shoulder shrug was symmetrical.    Motor exam:  assymmetric bulk, left calf circumference is smaller by 1/2 inch.  Right 14. 25, left 13. 75 inches.   tone and ROM.   left sided tone increased, but no cog wheeling.  Normal tone without cog wheeling, symmetric grip strength .   Sensory:  Fine touch, pinprick and vibration were t reduced on the left arm and leg    Coordination:The Finger-to-nose maneuver on the left with ataxia, dysmetria , no tremor.    Gait and station: Patient could rise unassisted from a seated position, walked with a cane for  assistive device.  Stance is of normal width/ base . Left leg is stiff, his foot is not lifting.  Toe and heel walk were deferred.  Deep tendon reflexes: left patella reflex was brisk, much stronger the the right.  Babinski response was up-going on the left   ASSESSMENT AND PLAN 82 y.o. year old male  here with:  Right internal capsular stroke./ residual findings of a right hemispheric brain stroke in 2021. The foot drop is most evident and increased the risk of falls. He already has a foot brace, which he feels  is more impairing that beneficial.   2) progression of left foot drop, and  numbness. muscle atrophy in the left lower extremity. No circulation impairment, normal capillary refill. Left foot numbness, and  a tight feeling around the forefoot.   3) left arm and hand with impaired propioception, coordination.    I will offer a NCV and EMG for this patient lower extremities. He already has a foot brace, which he feels is more impairing that beneficial.  He had no recent  MRI brain and cervical spine but I doubt this is a new stroke, this seems to be all sequelae of the previous events, and could also related to spinal stenosis, cervical spine.  MRI ordered.   I also reccommended hiking poles to walk outside.      I plan to follow up  through our NP  within 3-4  months.   I would like to thank Plotnikov, Georgina Quint, MD and Plotnikov, Georgina Quint, Md [address] [place],  [place] [address] for allowing me to meet with and to take care of this pleasant patient.    After spending a total time of  40  minutes face to face and additional time for physical and neurologic examination, review of laboratory studies,  personal review of imaging studies, reports and results of other testing and review of referral information / records as far as provided in visit,   Electronically signed by: Melvyn Novas, MD 11/02/2022 2:23 PM  [facility] Board certified by The [employer] of Sleep Medicine and Diplomate of the [employer] of Sleep Medicine. Board certified In Neurology through the ABPN, Fellow of the [employer] of Neurology. Medical Director of [facility].

## 2022-11-02 NOTE — Addendum Note (Signed)
Addended by: Melvyn Novas on: 11/02/2022 03:03 PM   Modules accepted: Orders

## 2022-11-02 NOTE — Telephone Encounter (Signed)
sent to GI they obtain [employer] medicare auth [id number] 

## 2022-11-03 LAB — BASIC METABOLIC PANEL
BUN/Creatinine Ratio: 20 (ref 10–24)
BUN: 19 mg/dL (ref 8–27)
CO2: 22 mmol/L (ref 20–29)
Calcium: 9.6 mg/dL (ref 8.6–10.2)
Chloride: 101 mmol/L (ref 96–106)
Creatinine, Ser: 0.94 mg/dL (ref 0.76–1.27)
Glucose: 77 mg/dL (ref 70–99)
Potassium: 4.3 mmol/L (ref 3.5–5.2)
Sodium: 143 mmol/L (ref 134–144)
eGFR: 82 mL/min/{1.73_m2} (ref >59)

## 2022-11-06 ENCOUNTER — Encounter: Payer: Self-pay | Admitting: Neurology

## 2022-11-10 ENCOUNTER — Encounter: Payer: Self-pay | Admitting: Neurology

## 2022-11-12 ENCOUNTER — Encounter: Payer: Self-pay | Admitting: Neurology

## 2022-11-25 ENCOUNTER — Ambulatory Visit
Admission: RE | Admit: 2022-11-25 | Discharge: 2022-11-25 | Disposition: A | Discharge status: Home / Self Care | Payer: Medicare HMO | Source: Ambulatory Visit | Attending: Neurology | Admitting: Neurology

## 2022-11-25 ENCOUNTER — Telehealth: Payer: Self-pay | Admitting: Neurology

## 2022-11-25 DIAGNOSIS — Z9181 History of falling: Secondary | ICD-10-CM

## 2022-11-25 DIAGNOSIS — I6523 Occlusion and stenosis of bilateral carotid arteries: Secondary | ICD-10-CM

## 2022-11-25 DIAGNOSIS — I69354 Hemiplegia and hemiparesis following cerebral infarction affecting left non-dominant side: Secondary | ICD-10-CM

## 2022-11-25 DIAGNOSIS — M21372 Foot drop, left foot: Secondary | ICD-10-CM

## 2022-11-25 DIAGNOSIS — M5030 Other cervical disc degeneration, unspecified cervical region: Secondary | ICD-10-CM

## 2022-11-25 NOTE — Telephone Encounter (Signed)
He left me a voice mail also asking for medication to take and asked for a call back to discuss that. He wants to reschedule his MRIs once he receives confirmation he will get his prescription. To reschedule his MRIs he can call [phone].

## 2022-11-25 NOTE — Telephone Encounter (Signed)
This pt was unable to complete MRI due to pain. Are you ok if I reorder it for him to complete under sedation?

## 2022-11-25 NOTE — Telephone Encounter (Signed)
Pt called stating that he was not able to do his MRI due to the pain it was causing. They were not able to position the pt the way he needed to be positioned due to previous surgeries. They informed him that he would have to have a sedative prescribed to him prior his next appt to relax his muscles. Please advise.

## 2022-11-30 NOTE — Addendum Note (Signed)
Addended by: Jacqualine Code D on: 11/30/2022 09:56 AM   Modules accepted: Orders

## 2022-11-30 NOTE — Addendum Note (Signed)
Addended by: Jacqualine Code D on: 11/30/2022 10:22 AM   Modules accepted: Orders

## 2022-11-30 NOTE — Telephone Encounter (Addendum)
Pt would like something to help relax him and for the pain during MRI. Not to be sedated.

## 2022-12-01 DIAGNOSIS — D225 Melanocytic nevi of trunk: Secondary | ICD-10-CM | POA: Diagnosis not present

## 2022-12-01 DIAGNOSIS — L821 Other seborrheic keratosis: Secondary | ICD-10-CM | POA: Diagnosis not present

## 2022-12-01 DIAGNOSIS — Z85828 Personal history of other malignant neoplasm of skin: Secondary | ICD-10-CM | POA: Diagnosis not present

## 2022-12-01 DIAGNOSIS — D2271 Melanocytic nevi of right lower limb, including hip: Secondary | ICD-10-CM | POA: Diagnosis not present

## 2022-12-01 DIAGNOSIS — D2262 Melanocytic nevi of left upper limb, including shoulder: Secondary | ICD-10-CM | POA: Diagnosis not present

## 2022-12-01 DIAGNOSIS — D2272 Melanocytic nevi of left lower limb, including hip: Secondary | ICD-10-CM | POA: Diagnosis not present

## 2022-12-01 DIAGNOSIS — L570 Actinic keratosis: Secondary | ICD-10-CM | POA: Diagnosis not present

## 2022-12-01 NOTE — Telephone Encounter (Signed)
Contacted pt and LVM per DPR, informing him MD sent Xanax 0.5 mg, 2 tabs  po , Advised him to get a designated driver for the test and back home. Number provided to call back with questions or concerns.

## 2022-12-03 ENCOUNTER — Other Ambulatory Visit: Payer: Self-pay | Admitting: Internal Medicine

## 2022-12-22 ENCOUNTER — Encounter: Payer: Self-pay | Admitting: Internal Medicine

## 2022-12-22 ENCOUNTER — Ambulatory Visit (INDEPENDENT_AMBULATORY_CARE_PROVIDER_SITE_OTHER): Payer: Medicare HMO | Admitting: Internal Medicine

## 2022-12-22 VITALS — BP 110/68 | HR 66 | Temp 98.6°F | Ht 74.0 in | Wt 185.0 lb

## 2022-12-22 DIAGNOSIS — R634 Abnormal weight loss: Secondary | ICD-10-CM | POA: Diagnosis not present

## 2022-12-22 DIAGNOSIS — R432 Parageusia: Secondary | ICD-10-CM

## 2022-12-22 DIAGNOSIS — R296 Repeated falls: Secondary | ICD-10-CM | POA: Diagnosis not present

## 2022-12-22 DIAGNOSIS — Z8673 Personal history of transient ischemic attack (TIA), and cerebral infarction without residual deficits: Secondary | ICD-10-CM | POA: Diagnosis not present

## 2022-12-22 DIAGNOSIS — F4323 Adjustment disorder with mixed anxiety and depressed mood: Secondary | ICD-10-CM

## 2022-12-22 DIAGNOSIS — M542 Cervicalgia: Secondary | ICD-10-CM

## 2022-12-22 MED ORDER — OXYCODONE-ACETAMINOPHEN 5-325 MG PO TABS: 1.0000 | ORAL_TABLET | ORAL | 0 refills | Status: DC | PRN

## 2022-12-22 NOTE — Assessment & Plan Note (Signed)
Wt Readings from Last 3 Encounters:  12/22/22 185 lb (83.9 kg)  11/02/22 185 lb (83.9 kg)  08/26/22 186 lb 3.2 oz (84.5 kg)  resolved

## 2022-12-22 NOTE — Progress Notes (Signed)
Subjective:  Patient ID: Francis Crews., male    DOB: 09/11/1940  Age: 82 y.o. MRN: [id number]  CC: Follow-up (4 mnth f/u)   HPI Francis M Golden Jr. presents for CVA, HTN, depression C/o neck pain on the MRI table - they had to quit the test  Outpatient Medications Prior to Visit  Medication Sig Dispense Refill   amLODipine (NORVASC) 2.5 MG tablet TAKE 1 TABLET BY MOUTH EVERY DAY 90 tablet 2   cholecalciferol (VITAMIN D) 1000 UNITS tablet Take 1,000 Units by mouth daily.     clopidogrel (PLAVIX) 75 MG tablet TAKE 1 TABLET BY MOUTH EVERY DAY 90 tablet 3   gabapentin (NEURONTIN) 100 MG capsule TAKE 1 CAPSULE (100 MG TOTAL) BY MOUTH THREE TIMES DAILY. 90 capsule 3   hydroxypropyl methylcellulose / hypromellose (ISOPTO TEARS / GONIOVISC) 2.5 % ophthalmic solution Place 1 drop into both eyes 3 (three) times daily as needed for dry eyes.     hydrOXYzine (ATARAX) 25 MG tablet TAKE 1 TABLET BY MOUTH AT BEDTIME AS NEEDED FOR ITCHING. 90 tablet 1   LORazepam (ATIVAN) 0.5 MG tablet Take 1 tablet (0.5 mg total) by mouth 2 (two) times daily as needed for anxiety. 60 tablet 2   losartan (COZAAR) 50 MG tablet TAKE 1 TABLET BY MOUTH DAILY 90 tablet 2   metFORMIN (GLUCOPHAGE) 500 MG tablet TAKE 1 TABLET BY MOUTH EVERY DAY WITH BREAKFAST 90 tablet 3   Omega-3 Fatty Acids (FISH OIL) 1000 MG CAPS Take 1,000 mg by mouth 2 (two) times daily.     oxybutynin (DITROPAN) 5 MG tablet TAKE 1 TABLET EVERY 8 HOURS AS NEED FOR BLADDER SPASMS (TO PREVENT URINARY SYMPTOMS WHEN YOU GO OUT) 270 tablet 1   pantoprazole (PROTONIX) 40 MG tablet TAKE 1 TABLET BY MOUTH EVERY DAY 90 tablet 3   SENEXON-S 8.6-50 MG tablet TAKE 1 TO 2 TABLETS BY MOUTH DAILY 100 tablet 3   tamsulosin (FLOMAX) 0.4 MG CAPS capsule Take 1 capsule (0.4 mg total) by mouth daily. 90 capsule 3   tolterodine (DETROL LA) 4 MG 24 hr capsule TAKE 1 CAPSULE BY MOUTH EVERY DAY 30 capsule 5   valACYclovir (VALTREX) 500 MG tablet TAKE 1 TABLET BY MOUTH TWICE  A DAY FOR CANCER SORES 180 tablet 1   vitamin C (ASCORBIC ACID) 250 MG tablet Take 250 mg by mouth daily.     No facility-administered medications prior to visit.    ROS: Review of Systems  Constitutional:  Positive for fatigue. Negative for appetite change and unexpected weight change.  HENT:  Negative for congestion, nosebleeds, sneezing, sore throat and trouble swallowing.   Eyes:  Negative for itching and visual disturbance.  Respiratory:  Negative for cough.   Cardiovascular:  Negative for chest pain, palpitations and leg swelling.  Gastrointestinal:  Negative for abdominal distention, blood in stool, diarrhea and nausea.  Genitourinary:  Negative for frequency and hematuria.  Musculoskeletal:  Positive for arthralgias, gait problem, neck pain and neck stiffness. Negative for joint swelling.  Skin:  Negative for rash.  Neurological:  Negative for dizziness, tremors, speech difficulty and weakness.  Psychiatric/Behavioral:  Negative for agitation, decreased concentration, dysphoric mood, sleep disturbance and suicidal ideas. The patient is nervous/anxious.     Objective:  BP 110/68 (BP Location: Left Arm, Patient Position: Sitting, Cuff Size: Large)   Pulse 66   Temp 98.6 F (37 C) (Oral)   Ht 6\' 2"  (1.88 m)   Wt 185 lb (83.9 kg)  SpO2 97%   BMI 23.75 kg/m   BP Readings from Last 3 Encounters:  12/22/22 110/68  11/02/22 (!) 150/77  08/26/22 118/60    Wt Readings from Last 3 Encounters:  12/22/22 185 lb (83.9 kg)  11/02/22 185 lb (83.9 kg)  08/26/22 186 lb 3.2 oz (84.5 kg)    Physical Exam Constitutional:      General: He is not in acute distress.    Appearance: Normal appearance. He is well-developed.     Comments: NAD  Eyes:     Conjunctiva/sclera: Conjunctivae normal.     Pupils: Pupils are equal, round, and reactive to light.  Neck:     Thyroid: No thyromegaly.     Vascular: No JVD.  Cardiovascular:     Rate and Rhythm: Normal rate and regular rhythm.      Heart sounds: Normal heart sounds. No murmur heard.    No friction rub. No gallop.  Pulmonary:     Effort: Pulmonary effort is normal. No respiratory distress.     Breath sounds: Normal breath sounds. No wheezing or rales.  Chest:     Chest wall: No tenderness.  Abdominal:     General: Bowel sounds are normal. There is no distension.     Palpations: Abdomen is soft. There is no mass.     Tenderness: There is no abdominal tenderness. There is no guarding or rebound.  Musculoskeletal:        General: Tenderness present. Normal range of motion.     Cervical back: Normal range of motion.     Right lower leg: No edema.     Left lower leg: No edema.  Lymphadenopathy:     Cervical: No cervical adenopathy.  Skin:    General: Skin is warm and dry.     Findings: No rash.  Neurological:     Mental Status: He is alert and oriented to person, place, and time.     Cranial Nerves: No cranial nerve deficit.     Motor: Weakness present. No abnormal muscle tone.     Coordination: Coordination abnormal.     Gait: Gait abnormal.     Deep Tendon Reflexes: Reflexes are normal and symmetric.  Psychiatric:        Behavior: Behavior normal.        Thought Content: Thought content normal.        Judgment: Judgment normal.    Using a cane   Lab Results  Component Value Date   WBC 9.6 05/27/2022   HGB 14.7 05/27/2022   HCT 44.3 05/27/2022   PLT 278.0 05/27/2022   GLUCOSE 77 11/02/2022   CHOL 185 11/10/2019   TRIG 78 11/10/2019   HDL 34 (L) 11/10/2019   LDLDIRECT 105.0 05/31/2019   LDLCALC 135 (H) 11/10/2019   ALT 14 08/26/2022   AST 20 08/26/2022   NA 143 11/02/2022   K 4.3 11/02/2022   CL 101 11/02/2022   CREATININE 0.94 11/02/2022   BUN 19 11/02/2022   CO2 22 11/02/2022   TSH 1.11 08/26/2022   PSA 0.30 05/31/2019   INR 1.0 11/09/2019   HGBA1C 6.2 08/26/2022    No results found.  Assessment & Plan:   Problem List Items Addressed This Visit     Weight loss    Wt  Readings from Last 3 Encounters:  12/22/22 185 lb (83.9 kg)  11/02/22 185 lb (83.9 kg)  08/26/22 186 lb 3.2 oz (84.5 kg)  resolved       Situational  mixed anxiety and depressive disorder    Better - living at [facility]      History of cerebrovascular accident (CVA) due to ischemia - Primary    Right internal capsular stroke./ residual findings of a right hemispheric brain stroke in 2021. The foot drop is most evident and increased the risk of falls. 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 Ok to repeat in 4 hrs onc).    Dispense:  2 tablet    Refill:  0      Follow-up: No follow-ups on file.  Sonda Primes, MD

## 2022-12-22 NOTE — Assessment & Plan Note (Signed)
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

## 2022-12-22 NOTE — Assessment & Plan Note (Addendum)
Right internal capsular stroke./ residual findings of a right hemispheric brain stroke in 2021. The foot drop is most evident and increased the risk of falls. BP control Exercising now at the [facility] MRI is pending

## 2022-12-22 NOTE — Assessment & Plan Note (Signed)
Better - living at [facility]

## 2022-12-22 NOTE — Assessment & Plan Note (Signed)
[facility] is pending

## 2022-12-22 NOTE — Assessment & Plan Note (Signed)
Use [employer]&Hammer Peroxicare tooth paste - brush 3-4 times a day ? ?

## 2022-12-24 NOTE — Telephone Encounter (Signed)
He left me a voice mail that he is ready to be scheduled now since he has all the medicine he needs to take before the MRI. I sent a message to GI to call him again. They have already got it approved with [employer].

## 2023-01-02 ENCOUNTER — Other Ambulatory Visit: Payer: Self-pay | Admitting: Internal Medicine

## 2023-01-13 ENCOUNTER — Ambulatory Visit
Admission: RE | Admit: 2023-01-13 | Discharge: 2023-01-13 | Disposition: A | Discharge status: Home / Self Care | Payer: Medicare HMO | Source: Ambulatory Visit | Attending: Neurology | Admitting: Neurology

## 2023-01-13 DIAGNOSIS — Z9181 History of falling: Secondary | ICD-10-CM | POA: Diagnosis not present

## 2023-01-13 DIAGNOSIS — I6523 Occlusion and stenosis of bilateral carotid arteries: Secondary | ICD-10-CM | POA: Diagnosis not present

## 2023-01-13 DIAGNOSIS — M21372 Foot drop, left foot: Secondary | ICD-10-CM | POA: Diagnosis not present

## 2023-01-13 DIAGNOSIS — I69354 Hemiplegia and hemiparesis following cerebral infarction affecting left non-dominant side: Secondary | ICD-10-CM | POA: Diagnosis not present

## 2023-01-13 DIAGNOSIS — M4802 Spinal stenosis, cervical region: Secondary | ICD-10-CM | POA: Diagnosis not present

## 2023-01-13 DIAGNOSIS — M5030 Other cervical disc degeneration, unspecified cervical region: Secondary | ICD-10-CM | POA: Diagnosis not present

## 2023-01-13 MED ORDER — GADOPICLENOL 0.5 MMOL/ML IV SOLN
9.0000 mL | Freq: Once | INTRAVENOUS | Status: AC | PRN
  Administered 2023-01-13: 9 mL via INTRAVENOUS

## 2023-01-14 NOTE — Progress Notes (Signed)
"  remote right small bilateral basal ganglia lacunar infarcts and mild changes of chronic small vessel disease.  The subarachnoid spaces and medical system show slight dilatation"  Interpretation: Only a remote ( old , not acute) stroke was described and no new findings, no blocked vessels, scars or focal atrophy.

## 2023-01-19 ENCOUNTER — Telehealth: Payer: Self-pay | Admitting: Neurology

## 2023-01-19 NOTE — Telephone Encounter (Signed)
LVM and sent mychart msg informing pt of need to reschedule 01/25/23 NCV/EMG - MD out

## 2023-01-21 ENCOUNTER — Encounter: Payer: Medicare HMO | Admitting: Neurology

## 2023-01-24 DIAGNOSIS — K219 Gastro-esophageal reflux disease without esophagitis: Secondary | ICD-10-CM | POA: Diagnosis not present

## 2023-01-24 DIAGNOSIS — E1151 Type 2 diabetes mellitus with diabetic peripheral angiopathy without gangrene: Secondary | ICD-10-CM | POA: Diagnosis not present

## 2023-01-24 DIAGNOSIS — N529 Male erectile dysfunction, unspecified: Secondary | ICD-10-CM | POA: Diagnosis not present

## 2023-01-24 DIAGNOSIS — M4800 Spinal stenosis, site unspecified: Secondary | ICD-10-CM | POA: Diagnosis not present

## 2023-01-24 DIAGNOSIS — Z8249 Family history of ischemic heart disease and other diseases of the circulatory system: Secondary | ICD-10-CM | POA: Diagnosis not present

## 2023-01-24 DIAGNOSIS — E785 Hyperlipidemia, unspecified: Secondary | ICD-10-CM | POA: Diagnosis not present

## 2023-01-24 DIAGNOSIS — R609 Edema, unspecified: Secondary | ICD-10-CM | POA: Diagnosis not present

## 2023-01-24 DIAGNOSIS — I10 Essential (primary) hypertension: Secondary | ICD-10-CM | POA: Diagnosis not present

## 2023-01-24 DIAGNOSIS — M1990 Unspecified osteoarthritis, unspecified site: Secondary | ICD-10-CM | POA: Diagnosis not present

## 2023-01-24 DIAGNOSIS — I2510 Atherosclerotic heart disease of native coronary artery without angina pectoris: Secondary | ICD-10-CM | POA: Diagnosis not present

## 2023-01-24 DIAGNOSIS — R32 Unspecified urinary incontinence: Secondary | ICD-10-CM | POA: Diagnosis not present

## 2023-01-24 DIAGNOSIS — E1142 Type 2 diabetes mellitus with diabetic polyneuropathy: Secondary | ICD-10-CM | POA: Diagnosis not present

## 2023-01-25 ENCOUNTER — Encounter: Payer: Medicare HMO | Admitting: Neurology

## 2023-01-27 ENCOUNTER — Telehealth: Payer: Self-pay | Admitting: Internal Medicine

## 2023-01-27 MED ORDER — OXYCODONE-ACETAMINOPHEN 5-325 MG PO TABS: 1.0000 | ORAL_TABLET | ORAL | 0 refills | Status: AC | PRN

## 2023-01-27 NOTE — Telephone Encounter (Signed)
I actually sent his Percocet prescription in in July, he probably never picked it up.  I will send it again.  Thanks

## 2023-01-27 NOTE — Telephone Encounter (Signed)
Caller & Relationship to patient: Self  Call back number: [phone]   Date of last office visit: 7.9.24  Date of next office visit: 11.12.24  Medication(s) to be refilled:  oxyCODONE-acetaminophen (PERCOCET/ROXICET) 5-325 MG tablet   Preferred Pharmacy:  [facility] [phone]   Phone: [phone]  Fax: [phone]    Pt is having MRI done on Monday and would like to have this RX filled before then

## 2023-01-28 NOTE — Telephone Encounter (Signed)
Notified pt rx sent to [facility].Francis Sims

## 2023-02-01 ENCOUNTER — Ambulatory Visit
Admission: RE | Admit: 2023-02-01 | Discharge: 2023-02-01 | Disposition: A | Discharge status: Home / Self Care | Payer: Medicare HMO | Source: Ambulatory Visit | Attending: Neurology | Admitting: Neurology

## 2023-02-01 DIAGNOSIS — I69354 Hemiplegia and hemiparesis following cerebral infarction affecting left non-dominant side: Secondary | ICD-10-CM

## 2023-02-01 DIAGNOSIS — M21372 Foot drop, left foot: Secondary | ICD-10-CM | POA: Diagnosis not present

## 2023-02-01 DIAGNOSIS — M5030 Other cervical disc degeneration, unspecified cervical region: Secondary | ICD-10-CM

## 2023-02-01 DIAGNOSIS — Z981 Arthrodesis status: Secondary | ICD-10-CM | POA: Diagnosis not present

## 2023-02-01 DIAGNOSIS — M4802 Spinal stenosis, cervical region: Secondary | ICD-10-CM

## 2023-02-01 DIAGNOSIS — Z9181 History of falling: Secondary | ICD-10-CM

## 2023-02-01 DIAGNOSIS — I6523 Occlusion and stenosis of bilateral carotid arteries: Secondary | ICD-10-CM | POA: Diagnosis not present

## 2023-02-01 MED ORDER — GADOPICLENOL 0.5 MMOL/ML IV SOLN
10.0000 mL | Freq: Once | INTRAVENOUS | Status: AC | PRN
  Administered 2023-02-01: 8 mL via INTRAVENOUS

## 2023-02-21 NOTE — Progress Notes (Deleted)
PATIENT: Francis Sims. DOB: 1941/05/07  REASON FOR VISIT: follow up HISTORY FROM: patient PRIMARY NEUROLOGIST:   HISTORY OF PRESENT ILLNESS: Today 02/21/23  Francis Sims. is a 82 y.o. male who has been followed in this office for ***. Returns today for follow-up.   HISTORY: 11-02-2022:  Francis Sims. is a 82 y.o. male patient who is here for revisit 11/02/2022 for  foot problem.  Chief concern according to patient :  This patient reports weakness in left foot,  the foot feels swollen and tight. He had 2 lower back surgeries 05-2011, 2/ 2013 and recovered well from that, had cervical anterior fusion in 2000. He is frustrated by not being able to play pickle ball, tennis or golf anymore. At home he is furniture surfing. He has had a bilateral artery stenosis - developed a stroke, was 16 days in hospital, at [facility]. My of 2021.  He lost 40 pounds, he lost muscle> he reports that his son Francis Sims / PTSD ) committed suicide, wife as dx with ovarian cancer and died within 34 months. He reports several falls since 2020.  He  reports his foot numbness is ascending.  Here to check this out.   CT angiogram: 11-09-2019.  Aortic arch: Mild calcified plaque along the arch and patent great vessel origins.   Right carotid system: Patent. There is atherosclerotic irregularity of the common carotid. Calcified and noncalcified plaque at the ICA origin causing less than 50% stenosis. Probable small area of ulcerated plaque at the common carotid bifurcation. There is also additional mild atherosclerotic irregularity along remainder of the cervical ICA.   Left carotid system: Patent. Eccentric noncalcified plaque along the common carotid. Primarily noncalcified plaque at the ICA origin causing approximately 50% stenosis.   MRI brain 10-2019: Left arm numbness, left leg weakness,  right facial droop. IMPRESSION: Small acute infarct in the region of the posterior limb of  the right internal capsule.   MRI brain 03-25-2020 : No acute intracranial abnormality. 2. Generalized atrophy and findings of chronic small vessel ischemic disease.  CT head ; Fall against object. Concussion without loss of consciousness, initial encounter. Head trauma, moderate/severe. Neck trauma. Additional history provided: Patient reports slipping and falling in driveway last Thursday (hitting head). Bruising to left eye and cheek bone. Patient on Eliquis.   and cervical spine, 1 -2023.  cerebral and cerebellar atrophy. Redemonstrated prominent perivascular spaces within the basal ganglia and left subinsular white matter. Mild patchy hypoattenuation within the cerebral white matter, nonspecific but compatible with chronic small vessel ischemic disease. There is no acute intracranial hemorrhage. No demarcated cortical infarct.   CT neck : vRedemonstrated sequela of prior C5-C7 ACDF. No evidence of acute hardware compromise.   Cervical spondylosis with multilevel disc space narrowing, disc bulges/central disc protrusions, posterior disc osteophytes, endplate spurring, uncovertebral hypertrophy and facet arthrosis. No appreciable high-grade spinal canal stenosis. Multilevel bony neural foraminal narrowing. At C2-C3, disc degeneration is advanced and there is early osseous fusion across the disc space.      REVIEW OF SYSTEMS: Out of a complete 14 system review of symptoms, the patient complains only of the following symptoms, and all other reviewed systems are negative.  ALLERGIES: Allergies  Allergen Reactions   Penicillins Hives and Swelling   Sulfonamide Derivatives Hives    Almost stopped heart in the service..Also, causes hives   Metoprolol     fatigue   Hydroxyzine     Too strong  Ibuprofen Itching    Pt states he can take ibuprofen   Statins     aches    HOME MEDICATIONS: Outpatient Medications Prior to Visit  Medication Sig Dispense Refill    amLODipine (NORVASC) 2.5 MG tablet TAKE 1 TABLET BY MOUTH EVERY DAY 90 tablet 2   cholecalciferol (VITAMIN D) 1000 UNITS tablet Take 1,000 Units by mouth daily.     clopidogrel (PLAVIX) 75 MG tablet TAKE 1 TABLET BY MOUTH EVERY DAY 90 tablet 3   gabapentin (NEURONTIN) 100 MG capsule TAKE 1 CAPSULE (100 MG TOTAL) BY MOUTH 3 TIMES A DAY 90 capsule 3   hydroxypropyl methylcellulose / hypromellose (ISOPTO TEARS / GONIOVISC) 2.5 % ophthalmic solution Place 1 drop into both eyes 3 (three) times daily as needed for dry eyes.     hydrOXYzine (ATARAX) 25 MG tablet TAKE 1 TABLET BY MOUTH AT BEDTIME AS NEEDED FOR ITCHING. 90 tablet 1   LORazepam (ATIVAN) 0.5 MG tablet Take 1 tablet (0.5 mg total) by mouth 2 (two) times daily as needed for anxiety. 60 tablet 2   losartan (COZAAR) 50 MG tablet TAKE 1 TABLET BY MOUTH DAILY 90 tablet 2   metFORMIN (GLUCOPHAGE) 500 MG tablet TAKE 1 TABLET BY MOUTH EVERY DAY WITH BREAKFAST 90 tablet 3   Omega-3 Fatty Acids (FISH OIL) 1000 MG CAPS Take 1,000 mg by mouth 2 (two) times daily.     oxybutynin (DITROPAN) 5 MG tablet TAKE 1 TABLET EVERY 8 HOURS AS NEED FOR BLADDER SPASMS (TO PREVENT URINARY SYMPTOMS WHEN YOU GO OUT) 270 tablet 1   oxyCODONE-acetaminophen (PERCOCET/ROXICET) 5-325 MG tablet Take 1 tablet by mouth every 4 (four) hours as needed for severe pain (Take 1 h before MRI. Ok to repeat in 4 hrs onc). 2 tablet 0   pantoprazole (PROTONIX) 40 MG tablet TAKE 1 TABLET BY MOUTH EVERY DAY 90 tablet 3   SENEXON-S 8.6-50 MG tablet TAKE 1 TO 2 TABLETS BY MOUTH DAILY 100 tablet 3   tamsulosin (FLOMAX) 0.4 MG CAPS capsule Take 1 capsule (0.4 mg total) by mouth daily. 90 capsule 3   tolterodine (DETROL LA) 4 MG 24 hr capsule TAKE 1 CAPSULE BY MOUTH EVERY DAY 30 capsule 5   valACYclovir (VALTREX) 500 MG tablet TAKE 1 TABLET BY MOUTH TWICE A DAY FOR CANCER SORES 180 tablet 1   vitamin C (ASCORBIC ACID) 250 MG tablet Take 250 mg by mouth daily.     No facility-administered  medications prior to visit.    PAST MEDICAL HISTORY: Past Medical History:  Diagnosis Date   Burping    per pt lots of burping   Chronic back pain    pinched nerve;buldging disc   Dry skin    on elbows;uses vasaline and it goes away   GERD (gastroesophageal reflux disease)    OTC   Hemorrhoids    Hx of colonic polyps    76yrs ago   Hypertension    Nocturia    PONV (postoperative nausea and vomiting)    Stroke (HCC) 10/2019    PAST SURGICAL HISTORY: Past Surgical History:  Procedure Laterality Date   ALVEOLOPLASTY Left 02/14/2021   Procedure: EXCISION OF LEFT MANDIBULAR ALVEOLAR CANCER; INCLUDING SECOND MOLAR;  Surgeon: Serena Colonel, MD;  Location: [facility] OR;  Service: ENT;  Laterality: Left;   BACK SURGERY     BROW LIFT Right 07/12/2013   Procedure: CONTRACTURE  RELEASE ZPLASTY OF RIGHT EYE, PERIORBITAL AREA WITH REPAIR OF PTOSIS OF RIGHT EYE BROW;  Surgeon:  Claire Sanger, DO;  Location: [facility];  Service: Plastics;  Laterality: Right;   CATARACT EXTRACTION  2008   bilateral   CERVICAL FUSION  2000   C5,6,7   COLONOSCOPY     EYE SURGERY     HERNIA REPAIR  1949   LUMBAR LAMINECTOMY/DECOMPRESSION MICRODISCECTOMY  05/26/2011   Procedure: LUMBAR LAMINECTOMY/DECOMPRESSION MICRODISCECTOMY;  Surgeon: Rolanda Lundborg Kritzer;  Location: [facility];  Service: Neurosurgery;  Laterality: Right;  Right Lumbar three-four Microdiskectomy   TONSILLECTOMY      FAMILY HISTORY: Family History  Problem Relation Age of Onset   COPD Other    Anesthesia problems Neg Hx    Hypotension Neg Hx    Malignant hyperthermia Neg Hx    Pseudochol deficiency Neg Hx     SOCIAL HISTORY: Social History   Socioeconomic History   Marital status: Widowed    Spouse name: Not on file   Number of children: Not on file   Years of education: Not on file   Highest education level: Not on file  Occupational History   Occupation: retired Building services engineer  Tobacco Use   Smoking status: Former   Smokeless  tobacco: Never   Tobacco comments:    in the 60's quit  Substance and Sexual Activity   Alcohol use: No    Alcohol/week: 0.0 standard drinks of alcohol   Drug use: No   Sexual activity: Yes  Other Topics Concern   Not on file  Social History Narrative   Regular  Exercise-yes: tennis, work outs   Social Determinants of Health   Financial Resource Strain: Low Risk  (08/26/2022)   Overall Financial Resource Strain (CARDIA)    Difficulty of Paying Living Expenses: Not hard at all  Food Insecurity: No Food Insecurity (08/26/2022)   Hunger Vital Sign    Worried About Running Out of Food in the Last Year: Never true    Ran Out of Food in the Last Year: Never true  Transportation Needs: No Transportation Needs (08/26/2022)   PRAPARE - Administrator, Civil Service (Medical): No    Lack of Transportation (Non-Medical): No  Physical Activity: Sufficiently Active (08/26/2022)   Exercise Vital Sign    Days of Exercise per Week: 5 days    Minutes of Exercise per Session: 30 min  Recent Concern: Physical Activity - Inactive (08/26/2022)   Exercise Vital Sign    Days of Exercise per Week: 0 days    Minutes of Exercise per Session: 30 min  Stress: No Stress Concern Present (08/26/2022)   [employer] of Occupational Health - Occupational Stress Questionnaire    Feeling of Stress : Not at all  Social Connections: Moderately Integrated (08/26/2022)   Social Connection and Isolation Panel [NHANES]    Frequency of Communication with Friends and Family: More than three times a week    Frequency of Social Gatherings with Friends and Family: Once a week    Attends Religious Services: More than 4 times per year    Active Member of [employer] or Organizations: Yes    Attends Banker Meetings: More than 4 times per year    Marital Status: Widowed  Intimate Partner Violence: Not At Risk (08/26/2022)   Humiliation, Afraid, Rape, and Kick questionnaire    Fear of Current or  Ex-Partner: No    Emotionally Abused: No    Physically Abused: No    Sexually Abused: No      PHYSICAL EXAM  There were no  vitals filed for this visit. There is no height or weight on file to calculate BMI.  Generalized: Well developed, in no acute distress   Neurological examination  Mentation: Alert oriented to time, place, history taking. Follows all commands speech and language fluent Cranial nerve II-XII: Pupils were equal round reactive to light. Extraocular movements were full, visual field were full on confrontational test. Facial sensation and strength were normal. Uvula tongue midline. Head turning and shoulder shrug  were normal and symmetric. Motor: The motor testing reveals 5 over 5 strength of all 4 extremities. Good symmetric motor tone is noted throughout.  Sensory: Sensory testing is intact to soft touch on all 4 extremities. No evidence of extinction is noted.  Coordination: Cerebellar testing reveals good finger-nose-finger and heel-to-shin bilaterally.  Gait and station: Gait is normal. Tandem gait is normal. Romberg is negative. No drift is seen.  Reflexes: Deep tendon reflexes are symmetric and normal bilaterally.   DIAGNOSTIC DATA (LABS, IMAGING, TESTING) - I reviewed patient records, labs, notes, testing and imaging myself where available.  Lab Results  Component Value Date   WBC 9.6 05/27/2022   HGB 14.7 05/27/2022   HCT 44.3 05/27/2022   MCV 83.1 05/27/2022   PLT 278.0 05/27/2022      Component Value Date/Time   NA 143 11/02/2022 1525   K 4.3 11/02/2022 1525   CL 101 11/02/2022 1525   CO2 22 11/02/2022 1525   GLUCOSE 77 11/02/2022 1525   GLUCOSE 112 (H) 08/26/2022 1118   BUN 19 11/02/2022 1525   CREATININE 0.94 11/02/2022 1525   CALCIUM 9.6 11/02/2022 1525   PROT 7.1 08/26/2022 1118   ALBUMIN 4.2 08/26/2022 1118   AST 20 08/26/2022 1118   ALT 14 08/26/2022 1118   ALKPHOS 86 08/26/2022 1118   BILITOT 0.9 08/26/2022 1118   GFRNONAA >60  02/21/2021 0333   GFRAA >60 11/23/2019 0554   Lab Results  Component Value Date   CHOL 185 11/10/2019   HDL 34 (L) 11/10/2019   LDLCALC 135 (H) 11/10/2019   LDLDIRECT 105.0 05/31/2019   TRIG 78 11/10/2019   CHOLHDL 5.4 11/10/2019   Lab Results  Component Value Date   HGBA1C 6.2 08/26/2022   Lab Results  Component Value Date   VITAMINB12 1,190 (H) 08/26/2022   Lab Results  Component Value Date   TSH 1.11 08/26/2022      ASSESSMENT AND PLAN 82 y.o. year old male  has a past medical history of Burping, Chronic back pain, Dry skin, GERD (gastroesophageal reflux disease), Hemorrhoids, colonic polyps, Hypertension, Nocturia, PONV (postoperative nausea and vomiting), and Stroke (HCC) (10/2019). here with ***     Butch Penny, MSN, NP-C 02/21/2023, 4:18 PM [facility] [address], [address] [place], [place] [address] [phone]

## 2023-02-22 ENCOUNTER — Ambulatory Visit: Payer: Medicare HMO | Admitting: Adult Health

## 2023-02-22 ENCOUNTER — Telehealth: Payer: Self-pay | Admitting: Neurology

## 2023-02-22 ENCOUNTER — Encounter: Payer: Self-pay | Admitting: *Deleted

## 2023-02-22 NOTE — Telephone Encounter (Signed)
Pt called to reschedule appt. Received a MyChart message to reschedule appt after Nerve Conduction on 03/25/23. Megan, NP did not have anything available. Discuss with nurse, Toma Copier; she will discuss with Megan,NP for an available to appt to before rescheduling.  Left pt a voicemail has cancelled appt for today with Francis Millet, NP as pt agreed. Informed him nurse will discuss with Francis Millet, NP for an available appt.

## 2023-03-01 NOTE — Telephone Encounter (Signed)
I have looked for him and others and I cannot find anything at this point 10/10 is soonest I am so sorry.

## 2023-03-13 ENCOUNTER — Other Ambulatory Visit: Payer: Self-pay | Admitting: Internal Medicine

## 2023-03-17 ENCOUNTER — Encounter: Payer: Medicare HMO | Admitting: Neurology

## 2023-03-25 ENCOUNTER — Ambulatory Visit (INDEPENDENT_AMBULATORY_CARE_PROVIDER_SITE_OTHER): Payer: Medicare HMO | Admitting: Podiatry

## 2023-03-25 ENCOUNTER — Encounter: Payer: Medicare HMO | Admitting: Neurology

## 2023-03-25 ENCOUNTER — Ambulatory Visit (INDEPENDENT_AMBULATORY_CARE_PROVIDER_SITE_OTHER): Payer: Medicare HMO | Admitting: Neurology

## 2023-03-25 ENCOUNTER — Ambulatory Visit: Payer: Medicare HMO | Admitting: Neurology

## 2023-03-25 DIAGNOSIS — I69354 Hemiplegia and hemiparesis following cerebral infarction affecting left non-dominant side: Secondary | ICD-10-CM

## 2023-03-25 DIAGNOSIS — I6523 Occlusion and stenosis of bilateral carotid arteries: Secondary | ICD-10-CM

## 2023-03-25 DIAGNOSIS — G608 Other hereditary and idiopathic neuropathies: Secondary | ICD-10-CM

## 2023-03-25 DIAGNOSIS — L600 Ingrowing nail: Secondary | ICD-10-CM

## 2023-03-25 DIAGNOSIS — G5601 Carpal tunnel syndrome, right upper limb: Secondary | ICD-10-CM

## 2023-03-25 DIAGNOSIS — Z9181 History of falling: Secondary | ICD-10-CM

## 2023-03-25 DIAGNOSIS — G5732 Lesion of lateral popliteal nerve, left lower limb: Secondary | ICD-10-CM

## 2023-03-25 DIAGNOSIS — M21372 Foot drop, left foot: Secondary | ICD-10-CM

## 2023-03-25 NOTE — Progress Notes (Signed)
Subjective:  Patient ID: Francis Crews., male    DOB: 01-25-1941,  MRN: [id number]  No chief complaint on file.   82 y.o. male presents with the above complaint.  Patient presents with bilateral hallux medial border ingrown painful to touch is progressive and worse worse with ambulation worse with pressure he would like to have removed he has not seen anyone as prior to seeing me hurts with taking every step.  Pain scale 7 out of 10 dull achy in nature   Review of Systems: Negative except as noted in the HPI. Denies N/V/F/Ch.  Past Medical History:  Diagnosis Date   Burping    per pt lots of burping   Chronic back pain    pinched nerve;buldging disc   Dry skin    on elbows;uses vasaline and it goes away   GERD (gastroesophageal reflux disease)    OTC   Hemorrhoids    Hx of colonic polyps    74yrs ago   Hypertension    Nocturia    PONV (postoperative nausea and vomiting)    Stroke (HCC) 10/2019    Current Outpatient Medications:    amLODipine (NORVASC) 2.5 MG tablet, TAKE 1 TABLET BY MOUTH EVERY DAY, Disp: 90 tablet, Rfl: 2   cholecalciferol (VITAMIN D) 1000 UNITS tablet, Take 1,000 Units by mouth daily., Disp: , Rfl:    clopidogrel (PLAVIX) 75 MG tablet, TAKE 1 TABLET BY MOUTH EVERY DAY, Disp: 90 tablet, Rfl: 3   gabapentin (NEURONTIN) 100 MG capsule, Take 1 capsule (100 mg total) by mouth 4 (four) times daily as needed., Disp: 120 capsule, Rfl: 3   hydroxypropyl methylcellulose / hypromellose (ISOPTO TEARS / GONIOVISC) 2.5 % ophthalmic solution, Place 1 drop into both eyes 3 (three) times daily as needed for dry eyes., Disp: , Rfl:    hydrOXYzine (ATARAX) 25 MG tablet, TAKE 1 TABLET BY MOUTH AT BEDTIME AS NEEDED FOR ITCHING., Disp: 90 tablet, Rfl: 1   LORazepam (ATIVAN) 0.5 MG tablet, Take 1 tablet (0.5 mg total) by mouth 2 (two) times daily as needed for anxiety., Disp: 60 tablet, Rfl: 2   losartan (COZAAR) 50 MG tablet, TAKE 1 TABLET BY MOUTH DAILY, Disp: 90 tablet,  Rfl: 2   metFORMIN (GLUCOPHAGE) 500 MG tablet, TAKE 1 TABLET BY MOUTH EVERY DAY WITH BREAKFAST, Disp: 90 tablet, Rfl: 3   Omega-3 Fatty Acids (FISH OIL) 1000 MG CAPS, Take 1,000 mg by mouth 2 (two) times daily., Disp: , Rfl:    oxybutynin (DITROPAN) 5 MG tablet, TAKE 1 TABLET EVERY 8 HOURS AS NEED FOR BLADDER SPASMS (TO PREVENT URINARY SYMPTOMS WHEN YOU GO OUT), Disp: 270 tablet, Rfl: 1   oxyCODONE-acetaminophen (PERCOCET/ROXICET) 5-325 MG tablet, Take 1 tablet by mouth every 4 (four) hours as needed for severe pain (Take 1 h before MRI. Ok to repeat in 4 hrs onc)., Disp: 2 tablet, Rfl: 0   pantoprazole (PROTONIX) 40 MG tablet, TAKE 1 TABLET BY MOUTH EVERY DAY, Disp: 90 tablet, Rfl: 3   SENEXON-S 8.6-50 MG tablet, TAKE 1 TO 2 TABLETS BY MOUTH DAILY, Disp: 100 tablet, Rfl: 3   tolterodine (DETROL LA) 4 MG 24 hr capsule, TAKE 1 CAPSULE BY MOUTH EVERY DAY, Disp: 30 capsule, Rfl: 5   valACYclovir (VALTREX) 500 MG tablet, TAKE 1 TABLET BY MOUTH TWICE A DAY FOR CANKER SORES, Disp: 180 tablet, Rfl: 1   vitamin C (ASCORBIC ACID) 250 MG tablet, Take 250 mg by mouth daily., Disp: , Rfl:   Social History  Tobacco Use  Smoking Status Former  Smokeless Tobacco Never  Tobacco Comments   in the 60's quit    Allergies  Allergen Reactions   Penicillins Hives and Swelling   Sulfonamide Derivatives Hives    Almost stopped heart in the service..Also, causes hives   Metoprolol     fatigue   Hydroxyzine     Too strong   Ibuprofen Itching    Pt states he can take ibuprofen   Statins     aches   Objective:  There were no vitals filed for this visit. There is no height or weight on file to calculate BMI. Constitutional Well developed. Well nourished.  Vascular Dorsalis pedis pulses palpable bilaterally. Posterior tibial pulses palpable bilaterally. Capillary refill normal to all digits.  No cyanosis or clubbing noted. Pedal hair growth normal.  Neurologic Normal speech. Oriented to person,  place, and time. Epicritic sensation to light touch grossly present bilaterally.  Dermatologic Painful ingrowing nail at medial nail borders of the hallux nail bilaterally. No other open wounds. No skin lesions.  Orthopedic: Normal joint ROM without pain or crepitus bilaterally. No visible deformities. No bony tenderness.   Radiographs: None Assessment:   1. Ingrown toenail of right foot   2. Ingrown left big toenail    Plan:  Patient was evaluated and treated and all questions answered.  Ingrown Nail, bilaterally -Patient elects to proceed with minor surgery to remove ingrown toenail removal today. Consent reviewed and signed by patient. -Ingrown nail excised. See procedure note. -Educated on post-procedure care including soaking. Written instructions provided and reviewed. -Patient to follow up in 2 weeks for nail check.  Procedure: Excision of Ingrown Toenail Location: Bilateral 1st toe medial nail borders. Anesthesia: Lidocaine 1% plain; 1.5 mL and Marcaine 0.5% plain; 1.5 mL, digital block. Skin Prep: Betadine. Dressing: Silvadene; telfa; dry, sterile, compression dressing. Technique: Following skin prep, the toe was exsanguinated and a tourniquet was secured at the base of the toe. The affected nail border was freed, split with a nail splitter, and excised. Chemical matrixectomy was then performed with phenol and irrigated out with alcohol. The tourniquet was then removed and sterile dressing applied. Disposition: Patient tolerated procedure well. Patient to return in 2 weeks for follow-up.   No follow-ups on file.

## 2023-03-25 NOTE — Patient Instructions (Addendum)
      Common Peroneal Nerve Entrapment  Common peroneal nerve entrapment is a condition that can make it hard to lift a foot. The condition results from pressure on a nerve in the lower leg called the common peroneal nerve. Your common peroneal nerve provides feeling to your outer lower leg and foot. It also supplies the muscles that move your foot and toes upward and outward. What are the causes? This condition may be caused by: Sitting cross-legged, squatting, or kneeling for long periods of time. A hard, direct hit to the outside of the lower leg. Swelling from a knee injury. A break or fracture in one of the lower leg bones. Wearing a boot or cast that ends just below the knee. A growth or cyst near the nerve. What increases the risk? This condition is more likely to develop in people who play: Contact sports, such as football or hockey. Sports where you wear high and stiff boots, such as skiing. What are the signs or symptoms? Symptoms of this condition include: Trouble lifting your foot up (foot drop). Tripping often. Your foot hitting the ground harder than normal as you walk, resulting in a slapping-type sound. Numbness, tingling, or pain in the outside of the knee, outside of the lower leg, and top of the foot. Sensitivity to pressure on the front or outside of the leg. How is this diagnosed? This condition may be diagnosed based on: Your symptoms. Your medical history. A physical exam. Tests, such as: X-rays to check the bones of your knee and leg. MRI to check tendons that attach to the side of your knee. Ultrasound to check for a growth or cyst. Electromyogram (EMG) to check your nerves. During your physical exam, your health care provider will check for numbness in your leg and test the strength of your lower leg muscles. He or she may tap the side of your lower leg to see if that causes tingling. How is this treated? Treatment for this condition may  include: Avoiding activities that make symptoms worse. Using a brace to hold up your foot and toes. Taking anti-inflammatory pain medicines to relieve swelling and lessen pain. Having medicines injected into your ankle joint to lessen pain and swelling. Doing exercises to help you regain or maintain movement (physical therapy). Surgery to take pressure off the nerve. This may be needed if there is no improvement after 2-3 months or if there is a growth pushing on the nerve. Returning gradually to full activity. Follow these instructions at home: If you have a removable brace: Wear the brace as told by your health care provider. Remove it only as told by your health care provider. Check the skin around the brace every day. Tell your health care provider about any concerns. Loosen the brace if your toes tingle, become numb, or turn cold and blue. Keep the brace clean. If the brace is not waterproof: Do not let it get wet. Cover it with a watertight covering when you take a bath or a shower. Activity Ask your health care provider when it is safe to drive if you have a brace on your foot. Do not do any activities that make pain or swelling worse. Do exercises as told by your health care provider. Return to your normal activities as told by your health care provider. Ask your health care provider what activities are safe for you. General instructions Take over-the-counter and prescription medicines only as told by your health care provider. Do not   knee until your health care provider says you can. Use crutches as directed by your health care provider. Keep all follow-up visits. This is important. How is this prevented? Wear supportive footwear that is appropriate for your athletic activity. Avoid athletic activities that cause ankle pain or swelling. Wear protective padding over your lower legs when playing contact sports. Make sure your boots do not put extra pressure on the area  just below your knees. Do not sit cross-legged for long periods of time. Contact a health care provider if: Your symptoms do not get better in 2-3 months. The weakness or numbness in your leg or foot gets worse. Summary Common peroneal nerve entrapment is a condition that results from pressure on a nerve in the lower leg called the common peroneal nerve. This condition may be caused by a hard hit, swelling, a fracture, or a cyst in the lower leg. Treatment may include rest, a brace, medicines, and physical therapy. Sometimes surgery is needed. Do not do any activities that make pain or swelling worse. This information is not intended to replace advice given to you by your health care provider. Make sure you discuss any questions you have with your health care provider. Document Revised: 02/12/2021 Document Reviewed: 02/12/2021 Elsevier Patient Education  2024 [employer].

## 2023-03-25 NOTE — Progress Notes (Signed)
Full Name: Francis Sims Gender: Male MRN #: [id number] Date of Birth: Oct 25, 1940    Visit Date: 03/25/2023 09:31 Age: 82 Years Examining Physician: Dr. Naomie Dean Referring Physician: Dr. Porfirio Mylar Dohmeier Height: 6 feet 2 inch  History: He can't lay down, he is sititng in a wheelchair chair and declines laying down so difficult technically to do the testing and cannot reach his more proximal muscles. Since his stroke his arm and left leg were symptomatic but his left leg never improved; in the left leg only he has progressive numbness to the knee. No low back pain or radicular symptoms however he has had back surgeries. Numbness starts in the left knee and goes to the toes. Strength intact DF, eversion and inversion. Only symptomatic in the left leg. Was on a walker for some time afer stroke but he has been very active and now uses a cane. Stroke was several years ago but the left leg symptoms not associated with stroke started 6 months ago with numbness whole foot then to the ankle then the knee gradual stable now for 2 months and has not gone above the knee. Feels numbness lateral knee not medial, may fit a peroneal neuropathy. The numbness is in the peroneal distribution per patient's explanation of symptoms.  Summary: NCS performed on the bilateral lower extremities and right upper extremity: The right median motor nerve showed delayed distal onset latency (4.5 ms, normal less than 4.4 ms).  The right peroneal EDM motor nerve showed reduced amplitude(1.44mv, N> 2). The left peroneal EDB motor nerve showed no response. The right and left Tibial motor nerves showed nor reponse. The left peroneal motor nerve(recording from the Tibialis Anterior) showed reduced amplitude amplitude(1.3mv, N>3) and decreased conduction velocity(7m/s,N>44). The right peroneal motor nerve(recording from the Tibialis Anterior) showed reduced amplitude amplitue(1.11mv, N>3).  The right radial sensory nerve showed  reduced amplitude (14 V, normal greater than 15).  The right sural, left sural, right superficial peroneal and left superficial perroneal sensory nerves showed no response.  The right median orthodromic sensory nerve showed delayed distal peak latency (3.6 ms, normal less than 3.4) and reduced amplitude (7 V, normal greater than 10).  The right ulnar orthodromic sensory nerve showed delayed distal peak latency (3.3 ms, normal less than 3.1).The right median/ulnar (palm) comparison nerve showed prolonged distal peak latency (Median Palm, 2.6 ms, N<2.2) and abnormal peak latency difference (Median Palm-Ulnar Palm, 0.6 ms, N<0.4) with a relative median delay.  All remaining nerves (as indicated in the following tables) were within normal limits.  EMG/NCS was incomplete as above and performed on the left lower extremity: The left peroneus longus showed decreased insertional activity, polyphasic motor units and diminished motor unit recruitment.  The left extensor hallucis longus showed polyphasic motor units and diminished motor unit recruitment. All remaining muscles (as indicated in the following tables) were within normal limits.         Conclusion:  Due to decreased mobility, patient is sitting in wheelchair. He declines changing positions so difficult technically to complete emg needle study as cannot access more proximal muscles. 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 There is concomitant right moderately severe Carpal Tunnel syndrome which appears to be asymptomatic.  Given chronic neurogenic changes in distal left peroneal muscles as well as patient's clinical symptoms suspect possible remote entrapment at the left peroneal head but no acute/ongoing denervation to suggest current entrapment. Could also be remote radiculopathy.   -------------------------------  Naomie Dean M.D.  [facility] [address] [place], [place] [address] Tel: [phone] Fax: [phone]  Verbal informed consent was obtained from the patient, patient was informed of potential risk of procedure, including bruising, bleeding, hematoma formation, infection, muscle weakness, muscle pain, numbness, among others.        MNC    Nerve / Sites Muscle Latency Ref. Amplitude Ref. Rel Amp Segments Distance Velocity Ref. Area    ms ms mV mV %  cm m/s m/s mVms  R Median - APB     Wrist APB 4.5 <=4.4 5.4 >=4.0 100 Wrist - APB 7   17.7     Upper arm APB 9.8  5.8  109 Upper arm - Wrist 29 55 >=49 24.7  R Ulnar - ADM     Wrist ADM 3.1 <=3.3 7.9 >=6.0 100 Wrist - ADM 7   27.7     B.Elbow ADM 5.4  6.9  87.7 B.Elbow - Wrist 12 52 >=49 24.9     A.Elbow ADM 8.8  6.9  99.1 A.Elbow - B.Elbow 16.6 49 >=49 26.1  R Peroneal - EDB     Ankle EDB 6.1 <=6.5 1.7 >=2.0 100 Ankle - EDB 9   6.6     Fib head EDB 12.3  1.6  98.5 Fib head - Ankle 30 49 >=44 7.6     Pop fossa EDB 13.9  0.9  56.8 Pop fossa - Fib head 12 74 >=44 4.4         Pop fossa - Ankle      L Peroneal - EDB     Ankle EDB NR <=6.5 NR >=2.0 NR Ankle - EDB 9   NR         Pop fossa - Ankle      R Tibial - AH     Ankle AH NR <=5.8 NR >=4.0 NR Ankle - AH 9   NR  L Tibial - AH     Ankle AH NR <=5.8 NR >=4.0 NR Ankle - AH 9   NR  L Peroneal - Tib Ant     Fib Head Tib Ant 1.9 <=4.7 1.6 >=3.0 100 Fib Head - Tib Ant 10   3.2     Pop fossa Tib Ant 4.4  2.5  151 Pop fossa - Fib Head 10 41 >=44 5.0  R Peroneal - Tib Ant     Fib Head Tib Ant 2.8 <=4.7 1.4 >=3.0 100 Fib Head - Tib Ant 10   2.3     Pop fossa Tib Ant 4.9  1.4  97.8 Pop fossa - Fib Head 10 49 >=44 2.2                     SNC    Nerve / Sites Rec. Site Peak Lat Ref.  Amp Ref. Segments Distance Peak Diff Ref.    ms ms V V  cm ms ms  R Radial - Anatomical snuff box (Forearm)     Forearm Wrist 2.3 <=2.9 14 >=15 Forearm - Wrist 10    R Sural - Ankle (Calf)     Calf Ankle NR <=4.4 NR >=6 Calf - Ankle 14    L Sural -  Ankle (Calf)     Calf Ankle NR <=4.4 NR >=6 Calf - Ankle 14    R Superficial peroneal - Ankle     Lat leg Ankle NR <=4.4 NR >=6 Lat leg - Ankle 14  L Superficial peroneal - Ankle     Lat leg Ankle NR <=4.4 NR >=6 Lat leg - Ankle 14    R Median, Ulnar - Transcarpal comparison     Median Palm Wrist 2.6 <=2.2 51 >=35 Median Palm - Wrist 8       Ulnar Palm Wrist 2.0 <=2.2 12 >=12 Ulnar Palm - Wrist 8          Median Palm - Ulnar Palm  0.6 <=0.4  R Median - Orthodromic (Dig II, Mid palm)     Dig II Wrist 3.6 <=3.4 7 >=10 Dig II - Wrist 13    R Ulnar - Orthodromic, (Dig V, Mid palm)     Dig V Wrist 3.3 <=3.1 6 >=5 Dig V - Wrist 62                       F  Wave    Nerve F Lat Ref.   ms ms  R Ulnar - ADM 30.5 <=32.0       EMG Summary Table    Spontaneous MUAP Recruitment  Muscle IA Fib PSW Fasc Other Amp Dur. Poly Pattern  L. Vastus medialis Normal None None None _______ Normal Normal Normal Normal  L. Peroneus longus Decreased None None None _______ Normal Normal 2+ Reduced  L. Tibialis anterior Normal None None None _______ Normal Normal Normal Normal  L. Gastrocnemius (Medial head) Normal None None None _______ Normal Normal Normal Normal  L. Extensor hallucis longus Normal None None None _______ Normal Normal 2+ Reduced  L. Biceps femoris (long head) Normal None None None _______ Normal Normal Normal Normal  L. Biceps femoris (short head) Normal None None None _______ Normal Normal Normal Normal

## 2023-04-01 ENCOUNTER — Encounter: Payer: Self-pay | Admitting: Neurology

## 2023-04-01 ENCOUNTER — Ambulatory Visit: Payer: Medicare HMO | Admitting: Neurology

## 2023-04-01 VITALS — BP 136/73 | HR 68 | Ht 74.0 in | Wt 184.0 lb

## 2023-04-01 DIAGNOSIS — G5732 Lesion of lateral popliteal nerve, left lower limb: Secondary | ICD-10-CM | POA: Diagnosis not present

## 2023-04-01 DIAGNOSIS — G629 Polyneuropathy, unspecified: Secondary | ICD-10-CM

## 2023-04-01 MED ORDER — GABAPENTIN 100 MG PO CAPS: 100.0000 mg | ORAL_CAPSULE | Freq: Four times a day (QID) | ORAL | 3 refills | Status: DC | PRN

## 2023-04-01 NOTE — Progress Notes (Signed)
Provider:  Melvyn Novas, MD  Primary Care Physician:  Francis Garter, MD [address] [place] [address]     Referring Provider: Plotnikov, Georgina Quint, Md [address] [place],  [place] [address]          Chief Complaint according to patient   Patient presents with:     New Patient (Initial Visit)           HISTORY OF PRESENT ILLNESS:  Francis Sims. is a 82 y.o. male patient who is here for revisit 04/01/2023 for  follow up on last visit . This is a stroke patient from a while back, had C5-6-7 anterior fusion , left access, and we evaluated for neuropathy.  His foot started getting numb on the left and ascended , beginning at the time of his stroke. Bilateral carotid artery stenosis +4 more.  Francis Sims has moved meanwhile to [facility], he still loved to play pickle ball, but hasn't been able-since the stroke which had hospitalized him for 24 days.  I looked at his cervical spine and there is no myelopathy just a slight 3 mm shift from C4 body upon C5 which is part of his 3 level, part of a 3 level fusion.  Fusion and limited MRI of the brain was read by stroke Dr. Pearlean Sims:  it showed bilateral remote age basal ganglia infarctions and some chronic small vessel disease some general atrophy no acute abnormality.  This again does not explain if any recent changes in balance would have happened but it may well explain why after the strokes his ability to play tennis his ability to keep well-balanced and to be mobile changed to drastically.  He does walk now with a cane.  Dr. 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  The numbness starts in the knees and goes to the toes.  She found that strength was intact for dorsiflexion at the feet eversion and inversion and that the numbness had not ascended above the knee.   if he is in pain , shall use gabapentin to help with symptoms of sensory  neuropathy, Since he seems to be only having motor symptoms and numbness , I think the best treatment is a foot lift, but he prefers a neuropathy shoes.  Left foot drop evident.     Dr Francis Sims: Chief concern according to patient : "He can't lay down, he is sititng in thechair and declines laying down so difficult technically to do the teating and cannot reach his low back. Since s atroke his arm and left leg went crazy but his left leg never improved in the left leg only he has progressive numbness to the knee. No low back pain or radicular symptoms however he has had back surgeries. Numbness starts in the knees and goes to the toes. Strength intact DF, eversion and inversion. Only symptomatic in the left leg. Was on a walker for some time afer stroke but he has been very active and now uses a cane. Stroke was several years ago but the left leg symptoms not associated with stroke started 6 months ago with numbness whole foot then to the ankle then the knee gradual stable now for 2 months and has not gone above the knee. Feels numbness lateral knee not medial, may fit a peroneal neuropathy. The numbness is in the peroneal distribution per  patient.".    11-02-2022:  Francis Sims. is a 82 y.o. male patient who is here for revisit 11/02/2022 for  foot problem.  Chief concern according to patient :  This patient reports weakness in left foot,  the foot feels swollen and tight. He had 2 lower back surgeries 05-2011, 2/ 2013 and recovered well from that, had cervical anterior fusion in 2000. He is frustrated by not being able to play pickle ball, tennis or golf anymore. At home he is furniture surfing. He has had a bilateral artery stenosis - developed a stroke, was 16 days in hospital, at [facility]. My of 2021.  He lost 40 pounds, he lost muscle> he reports that his son Francis Sims / PTSD ) committed suicide, wife as dx with ovarian cancer and died within 89 months. He reports several falls since 2020.   He  reports his foot numbness is ascending.  Here to check this out.    CT angiogram: 11-09-2019.  Aortic arch: Mild calcified plaque along the arch and patent great vessel origins.   Right carotid system: Patent. There is atherosclerotic irregularity of the common carotid. Calcified and noncalcified plaque at the ICA origin causing less than 50% stenosis. Probable small area of ulcerated plaque at the common carotid bifurcation. There is also additional mild atherosclerotic irregularity along remainder of the cervical ICA.   Left carotid system: Patent. Eccentric noncalcified plaque along the common carotid. Primarily noncalcified plaque at the ICA origin causing approximately 50% stenosis.     MRI brain 10-2019: Left arm numbness, left leg weakness,  right facial droop. IMPRESSION: Small acute infarct in the region of the posterior limb of the right internal capsule.   MRI brain 03-25-2020 : No acute intracranial abnormality. 2. Generalized atrophy and findings of chronic small vessel ischemic disease.       Review of Systems: Out of a complete 14 system review, the patient complains of only the following symptoms, and all other reviewed systems are negative.:   Gait and balance, neuropathy, left foot drop  Social History   Socioeconomic History   Marital status: Widowed    Spouse name: Not on file   Number of children: Not on file   Years of education: Not on file   Highest education level: Not on file  Occupational History   Occupation: retired Building services engineer  Tobacco Use   Smoking status: Former   Smokeless tobacco: Never   Tobacco comments:    in the 60's quit  Substance and Sexual Activity   Alcohol use: No    Alcohol/week: 0.0 standard drinks of alcohol   Drug use: No   Sexual activity: Yes  Other Topics Concern   Not on file  Social History Narrative   Regular  Exercise-yes: tennis, work outs   Social Determinants of Health   Financial Resource Strain: Low  Risk  (08/26/2022)   Overall Financial Resource Strain (CARDIA)    Difficulty of Paying Living Expenses: Not hard at all  Food Insecurity: No Food Insecurity (08/26/2022)   Hunger Vital Sign    Worried About Running Out of Food in the Last Year: Never true    Ran Out of Food in the Last Year: Never true  Transportation Needs: No Transportation Needs (08/26/2022)   PRAPARE - Administrator, Civil Service (Medical): No    Lack of Transportation (Non-Medical): No  Physical Activity: Sufficiently Active (08/26/2022)   Exercise Vital Sign    Days of Exercise per  Week: 5 days    Minutes of Exercise per Session: 30 min  Recent Concern: Physical Activity - Inactive (08/26/2022)   Exercise Vital Sign    Days of Exercise per Week: 0 days    Minutes of Exercise per Session: 30 min  Stress: No Stress Concern Present (08/26/2022)   [employer] of Occupational Health - Occupational Stress Questionnaire    Feeling of Stress : Not at all  Social Connections: Moderately Integrated (08/26/2022)   Social Connection and Isolation Panel [NHANES]    Frequency of Communication with Friends and Family: More than three times a week    Frequency of Social Gatherings with Friends and Family: Once a week    Attends Religious Services: More than 4 times per year    Active Member of [employer] or Organizations: Yes    Attends Banker Meetings: More than 4 times per year    Marital Status: Widowed    Family History  Problem Relation Age of Onset   COPD Other    Anesthesia problems Neg Hx    Hypotension Neg Hx    Malignant hyperthermia Neg Hx    Pseudochol deficiency Neg Hx     Past Medical History:  Diagnosis Date   Burping    per pt lots of burping   Chronic back pain    pinched nerve;buldging disc   Dry skin    on elbows;uses vasaline and it goes away   GERD (gastroesophageal reflux disease)    OTC   Hemorrhoids    Hx of colonic polyps    19yrs ago   Hypertension     Nocturia    PONV (postoperative nausea and vomiting)    Stroke (HCC) 10/2019    Past Surgical History:  Procedure Laterality Date   ALVEOLOPLASTY Left 02/14/2021   Procedure: EXCISION OF LEFT MANDIBULAR ALVEOLAR CANCER; INCLUDING SECOND MOLAR;  Surgeon: Serena Colonel, MD;  Location: [facility] OR;  Service: ENT;  Laterality: Left;   BACK SURGERY     BROW LIFT Right 07/12/2013   Procedure: CONTRACTURE  RELEASE ZPLASTY OF RIGHT EYE, PERIORBITAL AREA WITH REPAIR OF PTOSIS OF RIGHT EYE BROW;  Surgeon: Wayland Denis, DO;  Location: [facility];  Service: Plastics;  Laterality: Right;   CATARACT EXTRACTION  2008   bilateral   CERVICAL FUSION  2000   C5,6,7   COLONOSCOPY     EYE SURGERY     HERNIA REPAIR  1949   LUMBAR LAMINECTOMY/DECOMPRESSION MICRODISCECTOMY  05/26/2011   Procedure: LUMBAR LAMINECTOMY/DECOMPRESSION MICRODISCECTOMY;  Surgeon: Rolanda Lundborg Kritzer;  Location: [facility];  Service: Neurosurgery;  Laterality: Right;  Right Lumbar three-four Microdiskectomy   TONSILLECTOMY       Current Outpatient Medications on File Prior to Visit  Medication Sig Dispense Refill   amLODipine (NORVASC) 2.5 MG tablet TAKE 1 TABLET BY MOUTH EVERY DAY 90 tablet 2   cholecalciferol (VITAMIN D) 1000 UNITS tablet Take 1,000 Units by mouth daily.     clopidogrel (PLAVIX) 75 MG tablet TAKE 1 TABLET BY MOUTH EVERY DAY 90 tablet 3   gabapentin (NEURONTIN) 100 MG capsule TAKE 1 CAPSULE (100 MG TOTAL) BY MOUTH 3 TIMES A DAY 90 capsule 3   hydroxypropyl methylcellulose / hypromellose (ISOPTO TEARS / GONIOVISC) 2.5 % ophthalmic solution Place 1 drop into both eyes 3 (three) times daily as needed for dry eyes.     hydrOXYzine (ATARAX) 25 MG tablet TAKE 1 TABLET BY MOUTH AT BEDTIME AS NEEDED FOR ITCHING. 90 tablet  1   LORazepam (ATIVAN) 0.5 MG tablet Take 1 tablet (0.5 mg total) by mouth 2 (two) times daily as needed for anxiety. 60 tablet 2   losartan (COZAAR) 50 MG tablet TAKE 1 TABLET BY MOUTH DAILY 90  tablet 2   metFORMIN (GLUCOPHAGE) 500 MG tablet TAKE 1 TABLET BY MOUTH EVERY DAY WITH BREAKFAST 90 tablet 3   Omega-3 Fatty Acids (FISH OIL) 1000 MG CAPS Take 1,000 mg by mouth 2 (two) times daily.     oxybutynin (DITROPAN) 5 MG tablet TAKE 1 TABLET EVERY 8 HOURS AS NEED FOR BLADDER SPASMS (TO PREVENT URINARY SYMPTOMS WHEN YOU GO OUT) 270 tablet 1   oxyCODONE-acetaminophen (PERCOCET/ROXICET) 5-325 MG tablet Take 1 tablet by mouth every 4 (four) hours as needed for severe pain (Take 1 h before MRI. Ok to repeat in 4 hrs onc). 2 tablet 0   pantoprazole (PROTONIX) 40 MG tablet TAKE 1 TABLET BY MOUTH EVERY DAY 90 tablet 3   SENEXON-S 8.6-50 MG tablet TAKE 1 TO 2 TABLETS BY MOUTH DAILY 100 tablet 3   tamsulosin (FLOMAX) 0.4 MG CAPS capsule Take 1 capsule (0.4 mg total) by mouth daily. 90 capsule 3   tolterodine (DETROL LA) 4 MG 24 hr capsule TAKE 1 CAPSULE BY MOUTH EVERY DAY 30 capsule 5   valACYclovir (VALTREX) 500 MG tablet TAKE 1 TABLET BY MOUTH TWICE A DAY FOR CANCER SORES 180 tablet 1   vitamin C (ASCORBIC ACID) 250 MG tablet Take 250 mg by mouth daily.     No current facility-administered medications on file prior to visit.    Allergies  Allergen Reactions   Penicillins Hives and Swelling   Sulfonamide Derivatives Hives    Almost stopped heart in the service..Also, causes hives   Metoprolol     fatigue   Hydroxyzine     Too strong   Ibuprofen Itching    Pt states he can take ibuprofen   Statins     aches     DIAGNOSTIC DATA (LABS, IMAGING, TESTING) - I reviewed patient records, labs, notes, testing and imaging myself where available.  Lab Results  Component Value Date   WBC 9.6 05/27/2022   HGB 14.7 05/27/2022   HCT 44.3 05/27/2022   MCV 83.1 05/27/2022   PLT 278.0 05/27/2022      Component Value Date/Time   NA 143 11/02/2022 1525   K 4.3 11/02/2022 1525   CL 101 11/02/2022 1525   CO2 22 11/02/2022 1525   GLUCOSE 77 11/02/2022 1525   GLUCOSE 112 (H) 08/26/2022 1118    BUN 19 11/02/2022 1525   CREATININE 0.94 11/02/2022 1525   CALCIUM 9.6 11/02/2022 1525   PROT 7.1 08/26/2022 1118   ALBUMIN 4.2 08/26/2022 1118   AST 20 08/26/2022 1118   ALT 14 08/26/2022 1118   ALKPHOS 86 08/26/2022 1118   BILITOT 0.9 08/26/2022 1118   GFRNONAA >60 02/21/2021 0333   GFRAA >60 11/23/2019 0554   Lab Results  Component Value Date   CHOL 185 11/10/2019   HDL 34 (L) 11/10/2019   LDLCALC 135 (H) 11/10/2019   LDLDIRECT 105.0 05/31/2019   TRIG 78 11/10/2019   CHOLHDL 5.4 11/10/2019   Lab Results  Component Value Date   HGBA1C 6.2 08/26/2022   Lab Results  Component Value Date   VITAMINB12 1,190 (H) 08/26/2022   Lab Results  Component Value Date   TSH 1.11 08/26/2022    PHYSICAL EXAM:  Today's Vitals   04/01/23 1543  BP: 136/73  Pulse: 68  Weight: 184 lb (83.5 kg)  Height: 6\' 2"  (1.88 m)   Body mass index is 23.62 kg/m.   Wt Readings from Last 3 Encounters:  04/01/23 184 lb (83.5 kg)  12/22/22 185 lb (83.9 kg)  11/02/22 185 lb (83.9 kg)     Ht Readings from Last 3 Encounters:  04/01/23 6\' 2"  (1.88 m)  12/22/22 6\' 2"  (1.88 m)  11/02/22 6\' 2"  (1.88 m)      General: The patient is awake, alert and appears not in acute distress. The patient is well groomed. Head: Normocephalic, atraumatic. Cardiovascular:  Regular rate and cardiac rhythm by pulse,  without distended neck veins. Respiratory: Lungs are clear to auscultation.  Skin:  Without evidence of ankle edema, or rash. Trunk: The patient's posture is erect.   NEUROLOGIC EXAM:The patient is awake, alert and appears not in acute distress. The patient is well groomed. He was 225 pounds at the time of his son's suciide followed by wife's cancer diagnosed. Now 193 pounds.  Head: Normocephalic, atraumatic. Neck is supple. Mallampati; 2,  neck circumference:16 inches . Nasal airflow patent.   Retrognathia is not  seen.  Dental status:  Cardiovascular:  Regular rate and cardiac rhythm by pulse,   without distendd neck veins. Respiratory: Lungs are clear to auscultation.  Skin:  Without evidence of ankle edema, or rash. Trunk: The patient's posture is erect.   Neurologic exam : The patient is awake and alert, oriented to place and time.   Memory subjective described as intact.  Attention span & concentration ability appears normal.  Speech is fluent,  with  dysarthria, with dysphonia.  Mood and affect are appropriate.   Cranial nerves: no loss of smell or taste reported  Pupils are equal and briskly reactive to light. Funduscopic exam  deferred.  Extraocular movements in vertical and horizontal planes were intact and without nystagmus. No Diplopia. Visual fields by finger perimetry are intact. Hearing was intact to soft voice and finger rubbing.    Facial sensation intact to fine touch.  Facial motor strength is symmetric and tongue elavated higher on his right-  and uvula move midline.  Neck ROM : rotation, tilt and flexion extension were normal for age and shoulder shrug was symmetrical.    Motor exam:  assymmetric bulk, left calf circumference is smaller by 1/2 inch.  Right 14. 25, left 13. 75 inches.   tone and ROM.   left sided tone increased, but no cog wheeling.  Normal tone without cog wheeling, symmetric grip strength .  Can move either foot but has problems to lift the left big toe-    Sensory:  Fine touch, pinprick and vibration were t reduced on the left arm and leg    Coordination:The Finger-to-nose maneuver on the left with ataxia, dysmetria , no tremor.    Gait and station: Patient could rise unassisted from a seated position, walked with a cane for  assistive device.  Stance is of normal width/ base .  Left leg is stiff, his foot is not lifting.  Toe and heel walk were deferred.  Deep tendon reflexes: left patella reflex was brisk, much stronger the the right.  Babinski response was up-going on the left     ASSESSMENT AND PLAN 82 y.o. year old male  here  with:    1)  neuropathy from peroneal level to foot, slowly ascending according to patient , this would be unusual for a peroneus lesion,  may  be related to  stroke ? Thalamic. ? Why did he progress from there.   I have no found an answer and will sign off.   Continue gabapentin, may try alpha lipoic fatty acid supplement.     I would like to thank  Plotnikov, Georgina Quint, Md [address] [place],  [place] [address] for allowing me to meet with and to take care of this pleasant patient.  After spending a total time of  18  minutes face to face and additional time for physical and neurologic examination, review of laboratory studies,  personal review of imaging studies, reports and results of other testing and review of referral information / records as far as provided in visit,   Electronically signed by: Francis Novas, MD 04/01/2023 4:25 PM  [facility] Board certified by The [employer] of Sleep Medicine and Diplomate of the [employer] of Sleep Medicine. Board certified In Neurology through the ABPN, Fellow of the [employer] of Neurology.

## 2023-04-03 ENCOUNTER — Other Ambulatory Visit: Payer: Self-pay | Admitting: Internal Medicine

## 2023-04-05 DIAGNOSIS — G608 Other hereditary and idiopathic neuropathies: Secondary | ICD-10-CM | POA: Insufficient documentation

## 2023-04-05 DIAGNOSIS — G5601 Carpal tunnel syndrome, right upper limb: Secondary | ICD-10-CM | POA: Insufficient documentation

## 2023-04-07 ENCOUNTER — Ambulatory Visit: Payer: Medicare HMO | Admitting: Neurology

## 2023-04-27 ENCOUNTER — Encounter: Payer: Self-pay | Admitting: Internal Medicine

## 2023-04-27 ENCOUNTER — Ambulatory Visit: Payer: Medicare HMO | Admitting: Internal Medicine

## 2023-04-27 VITALS — BP 110/70 | HR 69 | Temp 97.8°F | Ht 74.0 in | Wt 184.0 lb

## 2023-04-27 DIAGNOSIS — R739 Hyperglycemia, unspecified: Secondary | ICD-10-CM

## 2023-04-27 DIAGNOSIS — R2681 Unsteadiness on feet: Secondary | ICD-10-CM | POA: Diagnosis not present

## 2023-04-27 DIAGNOSIS — F4321 Adjustment disorder with depressed mood: Secondary | ICD-10-CM

## 2023-04-27 DIAGNOSIS — R432 Parageusia: Secondary | ICD-10-CM

## 2023-04-27 DIAGNOSIS — I69354 Hemiplegia and hemiparesis following cerebral infarction affecting left non-dominant side: Secondary | ICD-10-CM | POA: Diagnosis not present

## 2023-04-27 DIAGNOSIS — G629 Polyneuropathy, unspecified: Secondary | ICD-10-CM

## 2023-04-27 LAB — COMPREHENSIVE METABOLIC PANEL
ALT: 24 U/L (ref 0–53)
AST: 26 U/L (ref 0–37)
Albumin: 4.6 g/dL (ref 3.5–5.2)
Alkaline Phosphatase: 89 U/L (ref 39–117)
BUN: 25 mg/dL — ABNORMAL HIGH (ref 6–23)
CO2: 30 meq/L (ref 19–32)
Calcium: 10.2 mg/dL (ref 8.4–10.5)
Chloride: 103 meq/L (ref 96–112)
Creatinine, Ser: 1.09 mg/dL (ref 0.40–1.50)
GFR: 64.19 mL/min (ref >60.00)
Glucose, Bld: 116 mg/dL — ABNORMAL HIGH (ref 70–99)
Potassium: 4.6 meq/L (ref 3.5–5.1)
Sodium: 140 meq/L (ref 135–145)
Total Bilirubin: 1 mg/dL (ref 0.2–1.2)
Total Protein: 7.8 g/dL (ref 6.0–8.3)

## 2023-04-27 LAB — HEMOGLOBIN A1C: Hgb A1c MFr Bld: 6.2 % (ref 4.6–6.5)

## 2023-04-27 MED ORDER — GABAPENTIN 300 MG PO CAPS: 300.0000 mg | ORAL_CAPSULE | Freq: Two times a day (BID) | ORAL | 1 refills | Status: DC

## 2023-04-27 NOTE — Assessment & Plan Note (Signed)
F/u w/Dr Dohmeier  Minimalistic shoes, cane Continue with Plavix and blood pressure control

## 2023-04-27 NOTE — Assessment & Plan Note (Signed)
Chronic  Use [employer] Peroxicare tooth paste - brush 3-4 times a day

## 2023-04-27 NOTE — Assessment & Plan Note (Signed)
Will increase Gabapentin to 300 mg bid F/u wDr Dohmeier

## 2023-04-27 NOTE — Assessment & Plan Note (Signed)
[facility]

## 2023-04-27 NOTE — Assessment & Plan Note (Signed)
Better  

## 2023-04-27 NOTE — Progress Notes (Signed)
Subjective:  Patient ID: Francis Sims., male    DOB: Feb 13, 1941  Age: 82 y.o. MRN: [id number]  CC: Medical Management of Chronic Issues (4 mnth f/u)   HPI Francis Sims. presents for post-CVA, gait disorder, HTN, neuropathy  Outpatient Medications Prior to Visit  Medication Sig Dispense Refill   amLODipine (NORVASC) 2.5 MG tablet TAKE 1 TABLET BY MOUTH EVERY DAY 90 tablet 2   cholecalciferol (VITAMIN D) 1000 UNITS tablet Take 1,000 Units by mouth daily.     clopidogrel (PLAVIX) 75 MG tablet TAKE 1 TABLET BY MOUTH EVERY DAY 90 tablet 3   hydroxypropyl methylcellulose / hypromellose (ISOPTO TEARS / GONIOVISC) 2.5 % ophthalmic solution Place 1 drop into both eyes 3 (three) times daily as needed for dry eyes.     hydrOXYzine (ATARAX) 25 MG tablet TAKE 1 TABLET BY MOUTH AT BEDTIME AS NEEDED FOR ITCHING. 90 tablet 1   LORazepam (ATIVAN) 0.5 MG tablet Take 1 tablet (0.5 mg total) by mouth 2 (two) times daily as needed for anxiety. 60 tablet 2   losartan (COZAAR) 50 MG tablet TAKE 1 TABLET BY MOUTH DAILY 90 tablet 2   metFORMIN (GLUCOPHAGE) 500 MG tablet TAKE 1 TABLET BY MOUTH EVERY DAY WITH BREAKFAST 90 tablet 3   Omega-3 Fatty Acids (FISH OIL) 1000 MG CAPS Take 1,000 mg by mouth 2 (two) times daily.     oxybutynin (DITROPAN) 5 MG tablet TAKE 1 TABLET EVERY 8 HOURS AS NEED FOR BLADDER SPASMS (TO PREVENT URINARY SYMPTOMS WHEN YOU GO OUT) 270 tablet 1   oxyCODONE-acetaminophen (PERCOCET/ROXICET) 5-325 MG tablet Take 1 tablet by mouth every 4 (four) hours as needed for severe pain (Take 1 h before MRI. Ok to repeat in 4 hrs onc). 2 tablet 0   pantoprazole (PROTONIX) 40 MG tablet TAKE 1 TABLET BY MOUTH EVERY DAY 90 tablet 3   SENEXON-S 8.6-50 MG tablet TAKE 1 TO 2 TABLETS BY MOUTH DAILY 100 tablet 3   tolterodine (DETROL LA) 4 MG 24 hr capsule TAKE 1 CAPSULE BY MOUTH EVERY DAY 30 capsule 5   valACYclovir (VALTREX) 500 MG tablet TAKE 1 TABLET BY MOUTH TWICE A DAY FOR CANKER SORES 180 tablet  1   vitamin C (ASCORBIC ACID) 250 MG tablet Take 250 mg by mouth daily.     gabapentin (NEURONTIN) 100 MG capsule Take 1 capsule (100 mg total) by mouth 4 (four) times daily as needed. 120 capsule 3   No facility-administered medications prior to visit.    ROS: Review of Systems  Constitutional:  Negative for appetite change, fatigue and unexpected weight change.  HENT:  Negative for congestion, nosebleeds, sneezing, sore throat and trouble swallowing.   Eyes:  Negative for itching and visual disturbance.  Respiratory:  Negative for cough.   Cardiovascular:  Negative for chest pain, palpitations and leg swelling.  Gastrointestinal:  Negative for abdominal distention, blood in stool, diarrhea and nausea.  Genitourinary:  Negative for frequency and hematuria.  Musculoskeletal:  Positive for gait problem. Negative for back pain, joint swelling and neck pain.  Skin:  Negative for rash.  Neurological:  Negative for dizziness, tremors, speech difficulty and weakness.  Psychiatric/Behavioral:  Negative for agitation, dysphoric mood, sleep disturbance and suicidal ideas. The patient is nervous/anxious.     Objective:  BP 110/70 (BP Location: Left Arm, Patient Position: Sitting, Cuff Size: Normal)   Pulse 69   Temp 97.8 F (36.6 C) (Oral)   Ht 6\' 2"  (1.88 m)  Wt 184 lb (83.5 kg)   SpO2 94%   BMI 23.62 kg/m   BP Readings from Last 3 Encounters:  04/27/23 110/70  04/01/23 136/73  12/22/22 110/68    Wt Readings from Last 3 Encounters:  04/27/23 184 lb (83.5 kg)  04/01/23 184 lb (83.5 kg)  12/22/22 185 lb (83.9 kg)    Physical Exam Constitutional:      General: He is not in acute distress.    Appearance: Normal appearance. He is well-developed.     Comments: NAD  Eyes:     Conjunctiva/sclera: Conjunctivae normal.     Pupils: Pupils are equal, round, and reactive to light.  Neck:     Thyroid: No thyromegaly.     Vascular: No JVD.  Cardiovascular:     Rate and Rhythm:  Normal rate and regular rhythm.     Heart sounds: Normal heart sounds. No murmur heard.    No friction rub. No gallop.  Pulmonary:     Effort: Pulmonary effort is normal. No respiratory distress.     Breath sounds: Normal breath sounds. No wheezing or rales.  Chest:     Chest wall: No tenderness.  Abdominal:     General: Bowel sounds are normal. There is no distension.     Palpations: Abdomen is soft. There is no mass.     Tenderness: There is no abdominal tenderness. There is no guarding or rebound.  Musculoskeletal:        General: No tenderness. Normal range of motion.     Cervical back: Normal range of motion.  Lymphadenopathy:     Cervical: No cervical adenopathy.  Skin:    General: Skin is warm and dry.     Findings: No rash.  Neurological:     Mental Status: He is alert and oriented to person, place, and time.     Cranial Nerves: No cranial nerve deficit.     Motor: No abnormal muscle tone.     Coordination: Coordination abnormal.     Gait: Gait abnormal.     Deep Tendon Reflexes: Reflexes are normal and symmetric.  Psychiatric:        Behavior: Behavior normal.        Thought Content: Thought content normal.        Judgment: Judgment normal.     Lab Results  Component Value Date   WBC 9.6 05/27/2022   HGB 14.7 05/27/2022   HCT 44.3 05/27/2022   PLT 278.0 05/27/2022   GLUCOSE 77 11/02/2022   CHOL 185 11/10/2019   TRIG 78 11/10/2019   HDL 34 (L) 11/10/2019   LDLDIRECT 105.0 05/31/2019   LDLCALC 135 (H) 11/10/2019   ALT 14 08/26/2022   AST 20 08/26/2022   NA 143 11/02/2022   K 4.3 11/02/2022   CL 101 11/02/2022   CREATININE 0.94 11/02/2022   BUN 19 11/02/2022   CO2 22 11/02/2022   TSH 1.11 08/26/2022   PSA 0.30 05/31/2019   INR 1.0 11/09/2019   HGBA1C 6.2 08/26/2022    MR CERVICAL SPINE W WO CONTRAST  Result Date: 02/01/2023  [facility] [address] [place], [place] [address] [phone] NEUROIMAGING REPORT STUDY DATE:  02/01/2023 PATIENT NAME: Francis Sims. DOB: [birth year] MRN: [id number] EXAM: MRI of the cervical spine with and without contrast ORDERING CLINICIAN: Porfirio Mylar Dohmeier MD CLINICAL HISTORY: 82 year old man with cervical spinal stenosis COMPARISON FILMS: CT 07/03/2021 TECHNIQUE: MRI of the cervical spine was obtained utilizing 3 mm sagittal slices  from the posterior fossa down to the T3-4 level with T1, T2 and inversion recovery views. In addition 4 mm axial slices from C2-3 down to T1-2 level were included with T2 and gradient echo views.  After the infusion of contrast, additional T1-weighted images were performed. CONTRAST: 8 mL [employer] IMAGING SITE: [facility], [address]:, [place], [place] FINDINGS: :  On sagittal images, the spine is imaged from above the cervicomedullary junction to T2.   The visible brain and the cervicomedullary junction appears normal.  The spinal cord appears normal.  Paravertebral soft tissue appears normal. There is 3 mm anterolisthesis of C4 upon C5, similar to the recent CT scan.  The patient has had C5-C7 ACDF..   The vertebral bodies have normal signal.  The discs and interspaces were further evaluated on axial views from C2 to T1 as follows: C2-C3: There is mild left facet hypertrophy causing minimal left foraminal narrowing but no spinal stenosis or nerve root compression. C3-C4: There is moderate left facet hypertrophy and small disc osteophyte complexes bilaterally and mild ligamenta flava hypertrophy causing moderate left and mild to moderate right foraminal narrowing.  There is no spinal stenosis or nerve root compression. C4-C5: There is 3 mm anterolisthesis, severe right facet hypertrophy and minimal disc bulging and mild ligamenta flava hypertrophy causing mild spinal stenosis and mild to moderate right and minimal left foraminal narrowing.  There does not appear to be nerve root compression. C5-C6: This level is postoperative.  There is no spinal stenosis or  nerve root compression. C6-C7: This level is postoperative.  There is no spinal stenosis or nerve root compression. C7-T1: Mild right facet hypertrophy.  The level is otherwise normal and there is no spinal stenosis or nerve root compression. After the infusion of contrast, a normal enhancement pattern is observed.   This MRI of the cervical spine with and without contrast shows the following: The spinal cord appears normal. At C3-C4, there are mild degenerative changes but no spinal stenosis or nerve root compression. At C4-C5, there is a 3 mm anterolisthesis and other degenerative change causing mild spinal stenosis but no nerve root compression. Remote ACDF at C5-C7.  No spinal stenosis or nerve root compression at these levels. After the infusion of contrast, a normal enhancement pattern is observed. INTERPRETING PHYSICIAN: Richard A. Epimenio Foot, MD, PhD, FAAN Certified in  Neuroimaging by [employer]    Assessment & Plan:   Problem List Items Addressed This Visit     Grief    [place]      Hyperglycemia   Relevant Orders   Comprehensive metabolic panel   Hemoglobin A1c   Hemiparesis affecting left side as late effect of cerebrovascular accident (CVA) (HCC)    F/u w/Dr Dohmeier  Minimalistic shoes, cane Continue with Plavix and blood pressure control      Gait instability - Primary    Better       Neuropathy    Will increase Gabapentin to 300 mg bid F/u wDr Dohmeier      Taste impairment    Chronic  Use [employer] tooth paste - brush 3-4 times a day         Meds ordered this encounter  Medications   gabapentin (NEURONTIN) 300 MG capsule    Sig: Take 1 capsule (300 mg total) by mouth 2 (two) times daily.    Dispense:  180 capsule    Refill:  1      Follow-up: Return  in about 4 months (around 08/25/2023) for a follow-up visit.  Sonda Primes, MD

## 2023-05-06 ENCOUNTER — Encounter: Payer: Medicare HMO | Admitting: Neurology

## 2023-05-23 ENCOUNTER — Other Ambulatory Visit: Payer: Self-pay | Admitting: Internal Medicine

## 2023-06-10 ENCOUNTER — Other Ambulatory Visit: Payer: Self-pay | Admitting: Internal Medicine

## 2023-07-09 ENCOUNTER — Other Ambulatory Visit: Payer: Self-pay | Admitting: Internal Medicine

## 2023-08-04 ENCOUNTER — Other Ambulatory Visit: Payer: Self-pay | Admitting: Internal Medicine

## 2023-08-05 ENCOUNTER — Ambulatory Visit (INDEPENDENT_AMBULATORY_CARE_PROVIDER_SITE_OTHER): Payer: Medicare HMO

## 2023-08-05 VITALS — Ht 74.0 in | Wt 187.0 lb

## 2023-08-05 DIAGNOSIS — Z0000 Encounter for general adult medical examination without abnormal findings: Secondary | ICD-10-CM | POA: Diagnosis not present

## 2023-08-05 NOTE — Patient Instructions (Signed)
 [name] , Thank you for taking time to come for your Medicare Wellness Visit. I appreciate your ongoing commitment to your health goals. Please review the following plan we discussed and let me know if I can assist you in the future.   Referrals/Orders/Follow-Ups/Clinician Recommendations: It was very nice talking to you today.  You are due for a tetanus vaccine.  Keep up the good work.  This is a list of the screening recommended for you and due dates:  Health Maintenance  Topic Date Due   COVID-19 Vaccine (3 - Pfizer risk series) 08/25/2019   DTaP/Tdap/Td vaccine (2 - Td or Tdap) 11/30/2022   Medicare Annual Wellness Visit  08/04/2024   Pneumonia Vaccine  Completed   HPV Vaccine  Aged Out   Flu Shot  Discontinued   Colon Cancer Screening  Discontinued   Zoster (Shingles) Vaccine  Discontinued    Advanced directives: (In Chart) A copy of your advanced directives are scanned into your chart should your provider ever need it.  Next Medicare Annual Wellness Visit scheduled for next year: Yes

## 2023-08-05 NOTE — Progress Notes (Cosign Needed Addendum)
 Subjective:   Francis Sims. is a 83 y.o. male who presents for Medicare Annual/Subsequent preventive examination.  Visit Complete: Virtual I connected with  Francis Sims. on 08/05/23 by a audio enabled telemedicine application and verified that I am speaking with the correct person using two identifiers.  Patient Location: Home  Provider Location: Office/Clinic  I discussed the limitations of evaluation and management by telemedicine. The patient expressed understanding and agreed to proceed.  Vital Signs: Because this visit was a virtual/telehealth visit, some criteria may be missing or patient reported. Any vitals not documented were not able to be obtained and vitals that have been documented are patient reported.  Cardiac Risk Factors include: advanced age (>19men, >63 women);male gender;hypertension;Other (see comment);dyslipidemia, Risk factor comments: PVD, Fatty Liver, TIA     Objective:    Today's Vitals   08/05/23 1128  Weight: 187 lb (84.8 kg)  Height: 6\' 2"  (1.88 m)   Body mass index is 24.01 kg/m.     08/05/2023   11:43 AM 08/26/2022    9:48 AM 08/18/2021   10:21 AM 02/18/2021   12:00 PM 02/12/2021   11:10 AM 06/18/2020   11:22 AM 03/25/2020   12:18 PM  Advanced Directives  Does Patient Have a Medical Advance Directive? Yes Yes Yes Yes No Yes No  Type of Estate agent of [place];Living will Healthcare Power of [place];Living will Living will;Healthcare Power of [employer] Power of [employer] Power of [place];Living will   Does patient want to make changes to medical advance directive? No - Patient declined No - Patient declined No - Patient declined No - Patient declined  No - Patient declined   Copy of Healthcare Power of Attorney in Chart? Yes - validated most recent copy scanned in chart (See row information) Yes - validated most recent copy scanned in chart (See row information) Yes - validated most recent copy  scanned in chart (See row information) Yes - validated most recent copy scanned in chart (See row information)  No - copy requested   Would patient like information on creating a medical advance directive?    No - Patient declined Yes (MAU/Ambulatory/Procedural Areas - Information given)  No - Patient declined    Current Medications (verified) Outpatient Encounter Medications as of 08/05/2023  Medication Sig   amLODipine (NORVASC) 2.5 MG tablet TAKE 1 TABLET BY MOUTH EVERY DAY   cholecalciferol (VITAMIN D) 1000 UNITS tablet Take 1,000 Units by mouth daily.   clopidogrel (PLAVIX) 75 MG tablet TAKE 1 TABLET BY MOUTH EVERY DAY   gabapentin (NEURONTIN) 100 MG capsule Take 100 mg by mouth 4 (four) times daily as needed.   hydroxypropyl methylcellulose / hypromellose (ISOPTO TEARS / GONIOVISC) 2.5 % ophthalmic solution Place 1 drop into both eyes 3 (three) times daily as needed for dry eyes.   hydrOXYzine (ATARAX) 25 MG tablet TAKE 1 TABLET BY MOUTH AT BEDTIME AS NEEDED FOR ITCHING.   losartan (COZAAR) 50 MG tablet TAKE 1 TABLET BY MOUTH DAILY   metFORMIN (GLUCOPHAGE) 500 MG tablet TAKE 1 TABLET BY MOUTH EVERY DAY WITH BREAKFAST   Omega-3 Fatty Acids (FISH OIL) 1000 MG CAPS Take 1,000 mg by mouth 2 (two) times daily.   oxybutynin (DITROPAN) 5 MG tablet TAKE 1 TABLET EVERY 8 HOURS AS NEED FOR BLADDER SPASMS (TO PREVENT URINARY SYMPTOMS WHEN YOU GO OUT)   pantoprazole (PROTONIX) 40 MG tablet TAKE 1 TABLET BY MOUTH EVERY DAY   SENEXON-S 8.6-50  MG tablet TAKE 1 TO 2 TABLETS BY MOUTH DAILY   tolterodine (DETROL LA) 4 MG 24 hr capsule TAKE 1 CAPSULE BY MOUTH EVERY DAY   valACYclovir (VALTREX) 500 MG tablet TAKE 1 TABLET BY MOUTH TWICE A DAY FOR CANKER SORES   vitamin C (ASCORBIC ACID) 250 MG tablet Take 250 mg by mouth daily.   gabapentin (NEURONTIN) 300 MG capsule Take 1 capsule (300 mg total) by mouth 2 (two) times daily. (Patient not taking: Reported on 08/05/2023)   LORazepam (ATIVAN) 0.5 MG tablet Take  1 tablet (0.5 mg total) by mouth 2 (two) times daily as needed for anxiety. (Patient not taking: Reported on 08/05/2023)   oxyCODONE-acetaminophen (PERCOCET/ROXICET) 5-325 MG tablet Take 1 tablet by mouth every 4 (four) hours as needed for severe pain (Take 1 h before MRI. Ok to repeat in 4 hrs onc). (Patient not taking: Reported on 08/05/2023)   No facility-administered encounter medications on file as of 08/05/2023.    Allergies (verified) Penicillins, Sulfonamide derivatives, Metoprolol, Hydroxyzine, Ibuprofen, and Statins   History: Past Medical History:  Diagnosis Date   Burping    per pt lots of burping   Chronic back pain    pinched nerve;buldging disc   Dry skin    on elbows;uses vasaline and it goes away   GERD (gastroesophageal reflux disease)    OTC   Hemorrhoids    Hx of colonic polyps    84yrs ago   Hypertension    Nocturia    PONV (postoperative nausea and vomiting)    Stroke (HCC) 10/2019   Past Surgical History:  Procedure Laterality Date   ALVEOLOPLASTY Left 02/14/2021   Procedure: EXCISION OF LEFT MANDIBULAR ALVEOLAR CANCER; INCLUDING SECOND MOLAR;  Surgeon: Serena Colonel, MD;  Location: [facility] OR;  Service: ENT;  Laterality: Left;   BACK SURGERY     BROW LIFT Right 07/12/2013   Procedure: CONTRACTURE  RELEASE ZPLASTY OF RIGHT EYE, PERIORBITAL AREA WITH REPAIR OF PTOSIS OF RIGHT EYE BROW;  Surgeon: Wayland Denis, DO;  Location: [facility];  Service: Plastics;  Laterality: Right;   CATARACT EXTRACTION  2008   bilateral   CERVICAL FUSION  2000   C5,6,7   COLONOSCOPY     EYE SURGERY     HERNIA REPAIR  1949   LUMBAR LAMINECTOMY/DECOMPRESSION MICRODISCECTOMY  05/26/2011   Procedure: LUMBAR LAMINECTOMY/DECOMPRESSION MICRODISCECTOMY;  Surgeon: Rolanda Lundborg Kritzer;  Location: [facility];  Service: Neurosurgery;  Laterality: Right;  Right Lumbar three-four Microdiskectomy   TONSILLECTOMY     Family History  Problem Relation Age of Onset   COPD Other     Anesthesia problems Neg Hx    Hypotension Neg Hx    Malignant hyperthermia Neg Hx    Pseudochol deficiency Neg Hx    Social History   Socioeconomic History   Marital status: Widowed    Spouse name: Not on file   Number of children: 2   Years of education: Not on file   Highest education level: Not on file  Occupational History   Occupation: retired Building services engineer  Tobacco Use   Smoking status: Former   Smokeless tobacco: Never   Tobacco comments:    in the 60's quit  Vaping Use   Vaping status: Never Used  Substance and Sexual Activity   Alcohol use: No    Alcohol/week: 0.0 standard drinks of alcohol   Drug use: No   Sexual activity: Yes  Other Topics Concern   Not on file  Social  History Narrative   Regular  Exercise-yes: tennis, work outs      Lives at [employer] deceased   Social Drivers of Health   Financial Resource Strain: Low Risk  (08/05/2023)   Overall Financial Resource Strain (CARDIA)    Difficulty of Paying Living Expenses: Not hard at all  Food Insecurity: No Food Insecurity (08/05/2023)   Hunger Vital Sign    Worried About Running Out of Food in the Last Year: Never true    Ran Out of Food in the Last Year: Never true  Transportation Needs: No Transportation Needs (08/05/2023)   PRAPARE - Administrator, Civil Service (Medical): No    Lack of Transportation (Non-Medical): No  Physical Activity: Sufficiently Active (08/05/2023)   Exercise Vital Sign    Days of Exercise per Week: 2 days    Minutes of Exercise per Session: 90 min  Stress: No Stress Concern Present (08/05/2023)   [employer] of Occupational Health - Occupational Stress Questionnaire    Feeling of Stress : Not at all  Social Connections: Moderately Isolated (08/05/2023)   Social Connection and Isolation Panel [NHANES]    Frequency of Communication with Friends and Family: More than three times a week    Frequency of Social Gatherings with Friends and Family: Twice a week     Attends Religious Services: Never    Database administrator or Organizations: Yes    Attends Banker Meetings: 1 to 4 times per year    Marital Status: Widowed    Tobacco Counseling Counseling given: Not Answered Tobacco comments: in the 60's quit   Clinical Intake:  Pre-visit preparation completed: Yes  Pain : No/denies pain     BMI - recorded: 24.01 Nutritional Status: BMI of 19-24  Normal Nutritional Risks: None Diabetes: No  How often do you need to have someone help you when you read instructions, pamphlets, or other written materials from your doctor or pharmacy?: 1 - Never  Interpreter Needed?: No  Information entered by :: Lilton Pare, RMA   Activities of Daily Living    08/05/2023   11:29 AM 08/26/2022   10:11 AM  In your present state of health, do you have any difficulty performing the following activities:  Hearing? 1 1  Comment wears hearing aides   Vision? 0 0  Difficulty concentrating or making decisions? 0 0  Walking or climbing stairs? 1 1  Dressing or bathing? 0 0  Doing errands, shopping? 0 0  Preparing Food and eating ? N N  Using the Toilet? N N  In the past six months, have you accidently leaked urine? N N  Do you have problems with loss of bowel control? N N  Managing your Medications? N N  Managing your Finances? N N  Housekeeping or managing your Housekeeping? N N    Patient Care Team: Plotnikov, Georgina Quint, MD as PCP - General Kritzer, Harvie Heck, MD as Attending Physician (Neurosurgery) Pa, [facility] as Consulting Physician (Optometry) Serena Colonel, MD as Consulting Physician (Otolaryngology) Dohmeier, Porfirio Mylar, MD as Consulting Physician (Neurology)  Indicate any recent Medical Services you may have received from other than [facility] providers in the past year (date may be approximate).     Assessment:   This is a routine wellness examination for [place].  Hearing/Vision screen Hearing Screening - Comments::  Wear hearing aides Vision Screening - Comments:: Has eyeglasses/does not wear them much   Goals Addressed  This Visit's Progress     Patient Stated (pt-stated)        Left leg still having a problem, still working on that      Depression Screen    08/05/2023   11:48 AM 04/27/2023   10:18 AM 12/22/2022   10:30 AM 08/26/2022    9:47 AM 08/18/2021   10:20 AM 07/29/2021   10:44 AM 07/17/2020    9:33 AM  PHQ 2/9 Scores  PHQ - 2 Score 0 0 0 0 0 0 0  PHQ- 9 Score 0   0   3    Fall Risk    08/05/2023   11:43 AM 04/27/2023   10:18 AM 12/22/2022   10:30 AM 08/26/2022   10:10 AM 08/18/2021   10:22 AM  Fall Risk   Falls in the past year? 0 0 0 0 1  Number falls in past yr: 0 0 0 0 1  Injury with Fall? 0 0 0 0 1  Risk for fall due to : No Fall Risks No Fall Risks No Fall Risks Impaired balance/gait History of fall(s);Impaired balance/gait;Impaired mobility  Follow up Falls prevention discussed;Falls evaluation completed Falls evaluation completed Falls evaluation completed Falls evaluation completed Falls evaluation completed    MEDICARE RISK AT HOME: Medicare Risk at Home Any stairs in or around the home?: No Home free of loose throw rugs in walkways, pet beds, electrical cords, etc?: Yes Adequate lighting in your home to reduce risk of falls?: Yes Life alert?: Yes Use of a cane, walker or w/c?: Yes Grab bars in the bathroom?: Yes Shower chair or bench in shower?: Yes Elevated toilet seat or a handicapped toilet?: Yes  TIMED UP AND GO:  Was the test performed?  No    Cognitive Function:        08/05/2023   11:44 AM 08/26/2022    9:48 AM  6CIT Screen  What Year? 0 points 0 points  What month? 0 points 0 points  What time? 0 points 0 points  Count back from 20 0 points 0 points  Months in reverse 0 points 0 points  Repeat phrase 0 points 0 points  Total Score 0 points 0 points    Immunizations Immunization History  Administered Date(s) Administered    Influenza, High Dose Seasonal PF 03/12/2018, 02/14/2020   PFIZER(Purple Top)SARS-COV-2 Vaccination 07/07/2019, 07/28/2019   Pneumococcal Conjugate-13 05/05/2013   Pneumococcal Polysaccharide-23 04/24/2008   Tdap 11/29/2012   Zoster, Live 11/28/2010    TDAP status: Due, Education has been provided regarding the importance of this vaccine. Advised may receive this vaccine at local pharmacy or Health Dept. Aware to provide a copy of the vaccination record if obtained from local pharmacy or Health Dept. Verbalized acceptance and understanding.  Flu Vaccine status: Declined, Education has been provided regarding the importance of this vaccine but patient still declined. Advised may receive this vaccine at local pharmacy or Health Dept. Aware to provide a copy of the vaccination record if obtained from local pharmacy or Health Dept. Verbalized acceptance and understanding.  Pneumococcal vaccine status: Up to date  Covid-19 vaccine status: Declined, Education has been provided regarding the importance of this vaccine but patient still declined. Advised may receive this vaccine at local pharmacy or Health Dept.or vaccine clinic. Aware to provide a copy of the vaccination record if obtained from local pharmacy or Health Dept. Verbalized acceptance and understanding.  Qualifies for Shingles Vaccine? Yes   Zostavax completed Yes   Shingrix Completed?: No.  Education has been provided regarding the importance of this vaccine. Patient has been advised to call insurance company to determine out of pocket expense if they have not yet received this vaccine. Advised may also receive vaccine at local pharmacy or Health Dept. Verbalized acceptance and understanding.  Screening Tests Health Maintenance  Topic Date Due   COVID-19 Vaccine (3 - Pfizer risk series) 08/25/2019   DTaP/Tdap/Td (2 - Td or Tdap) 11/30/2022   Medicare Annual Wellness (AWV)  08/04/2024   Pneumonia Vaccine 32+ Years old  Completed    HPV VACCINES  Aged Out   INFLUENZA VACCINE  Discontinued   Colonoscopy  Discontinued   Zoster Vaccines- Shingrix  Discontinued    Health Maintenance  Health Maintenance Due  Topic Date Due   COVID-19 Vaccine (3 - Pfizer risk series) 08/25/2019   DTaP/Tdap/Td (2 - Td or Tdap) 11/30/2022    Colorectal cancer screening: Type of screening: Colonoscopy. Completed 06/26/2014. Repeat every 10 years  Lung Cancer Screening: (Low Dose CT Chest recommended if Age 83-80 years, 20 pack-year currently smoking OR have quit w/in 15years.) does not qualify.   Lung Cancer Screening Referral: N/A  Additional Screening:  Hepatitis C Screening: does not qualify;  Vision Screening: Recommended annual ophthalmology exams for early detection of glaucoma and other disorders of the eye. Is the patient up to date with their annual eye exam?  Yes  Who is the provider or what is the name of the office in which the patient attends annual eye exams? [facility] If pt is not established with a provider, would they like to be referred to a provider to establish care? No .   Dental Screening: Recommended annual dental exams for proper oral hygiene   Community Resource Referral / Chronic Care Management: CRR required this visit?  No   CCM required this visit?  No     Plan:     I have personally reviewed and noted the following in the patient's chart:   Medical and social history Use of alcohol, tobacco or illicit drugs  Current medications and supplements including opioid prescriptions. Patient is not currently taking opioid prescriptions. Functional ability and status Nutritional status Physical activity Advanced directives List of other physicians Hospitalizations, surgeries, and ER visits in previous 12 months Vitals Screenings to include cognitive, depression, and falls Referrals and appointments  In addition, I have reviewed and discussed with patient certain preventive protocols,  quality metrics, and best practice recommendations. A written personalized care plan for preventive services as well as general preventive health recommendations were provided to patient.     Jafari Mckillop L Koree Staheli, CMA   08/05/2023   After Visit Summary: (MyChart) Due to this being a telephonic visit, the after visit summary with patients personalized plan was offered to patient via MyChart   Nurse Notes: Patient is due for a Tdap.  He declines all other vaccines.  Patient had no concerns to address today.  Medical screening examination/treatment/procedure(s) were performed by non-physician practitioner and as supervising physician I was immediately available for consultation/collaboration.  I agree with above. Jacinta Shoe, MD

## 2023-08-17 ENCOUNTER — Encounter: Payer: Self-pay | Admitting: Internal Medicine

## 2023-08-17 ENCOUNTER — Ambulatory Visit: Payer: Medicare HMO | Admitting: Internal Medicine

## 2023-08-17 VITALS — BP 118/68 | HR 64 | Temp 97.7°F | Ht 74.0 in | Wt 189.0 lb

## 2023-08-17 DIAGNOSIS — R2681 Unsteadiness on feet: Secondary | ICD-10-CM

## 2023-08-17 DIAGNOSIS — R438 Other disturbances of smell and taste: Secondary | ICD-10-CM | POA: Diagnosis not present

## 2023-08-17 DIAGNOSIS — I69354 Hemiplegia and hemiparesis following cerebral infarction affecting left non-dominant side: Secondary | ICD-10-CM

## 2023-08-17 DIAGNOSIS — F4321 Adjustment disorder with depressed mood: Secondary | ICD-10-CM | POA: Diagnosis not present

## 2023-08-17 MED ORDER — CLONAZEPAM 0.5 MG PO TABS: 0.5000 mg | ORAL_TABLET | Freq: Two times a day (BID) | ORAL | 2 refills | Status: AC | PRN

## 2023-08-17 NOTE — Progress Notes (Signed)
 Subjective:  Patient ID: Francis Sims., male    DOB: 09/01/1940  Age: 83 y.o. MRN: [id number]  CC: Medical Management of Chronic Issues (3 mnth f/u)   HPI Francis Sims. presents for HTN, CVA C/o ST - resolved C/o bad taste - metallic taste in the mouth, persistent in the area of surgery-it is very upsetting for at the patient  Outpatient Medications Prior to Visit  Medication Sig Dispense Refill   amLODipine (NORVASC) 2.5 MG tablet TAKE 1 TABLET BY MOUTH EVERY DAY 90 tablet 2   cholecalciferol (VITAMIN D) 1000 UNITS tablet Take 1,000 Units by mouth daily.     clopidogrel (PLAVIX) 75 MG tablet TAKE 1 TABLET BY MOUTH EVERY DAY 90 tablet 3   gabapentin (NEURONTIN) 100 MG capsule Take 100 mg by mouth 4 (four) times daily as needed.     hydroxypropyl methylcellulose / hypromellose (ISOPTO TEARS / GONIOVISC) 2.5 % ophthalmic solution Place 1 drop into both eyes 3 (three) times daily as needed for dry eyes.     hydrOXYzine (ATARAX) 25 MG tablet TAKE 1 TABLET BY MOUTH AT BEDTIME AS NEEDED FOR ITCHING. 90 tablet 1   losartan (COZAAR) 50 MG tablet TAKE 1 TABLET BY MOUTH DAILY 90 tablet 2   metFORMIN (GLUCOPHAGE) 500 MG tablet TAKE 1 TABLET BY MOUTH EVERY DAY WITH BREAKFAST 90 tablet 3   Omega-3 Fatty Acids (FISH OIL) 1000 MG CAPS Take 1,000 mg by mouth 2 (two) times daily.     oxybutynin (DITROPAN) 5 MG tablet TAKE 1 TABLET EVERY 8 HOURS AS NEED FOR BLADDER SPASMS (TO PREVENT URINARY SYMPTOMS WHEN YOU GO OUT) 270 tablet 1   pantoprazole (PROTONIX) 40 MG tablet TAKE 1 TABLET BY MOUTH EVERY DAY 90 tablet 3   SENEXON-S 8.6-50 MG tablet TAKE 1 TO 2 TABLETS BY MOUTH DAILY 100 tablet 3   tolterodine (DETROL LA) 4 MG 24 hr capsule TAKE 1 CAPSULE BY MOUTH EVERY DAY 30 capsule 5   valACYclovir (VALTREX) 500 MG tablet TAKE 1 TABLET BY MOUTH TWICE A DAY FOR CANKER SORES 180 tablet 1   vitamin C (ASCORBIC ACID) 250 MG tablet Take 250 mg by mouth daily.     gabapentin (NEURONTIN) 300 MG capsule  Take 1 capsule (300 mg total) by mouth 2 (two) times daily. (Patient not taking: Reported on 08/17/2023) 180 capsule 1   oxyCODONE-acetaminophen (PERCOCET/ROXICET) 5-325 MG tablet Take 1 tablet by mouth every 4 (four) hours as needed for severe pain (Take 1 h before MRI. Ok to repeat in 4 hrs onc). (Patient not taking: Reported on 08/17/2023) 2 tablet 0   LORazepam (ATIVAN) 0.5 MG tablet Take 1 tablet (0.5 mg total) by mouth 2 (two) times daily as needed for anxiety. (Patient not taking: Reported on 08/05/2023) 60 tablet 2   No facility-administered medications prior to visit.    ROS: Review of Systems  Constitutional:  Negative for appetite change, fatigue and unexpected weight change.  HENT:  Negative for congestion, nosebleeds, sneezing, sore throat, trouble swallowing and voice change.   Eyes:  Negative for itching and visual disturbance.  Respiratory:  Negative for cough.   Cardiovascular:  Negative for chest pain, palpitations and leg swelling.  Gastrointestinal:  Negative for abdominal distention, blood in stool, diarrhea and nausea.  Genitourinary:  Negative for frequency and hematuria.  Musculoskeletal:  Negative for back pain, gait problem, joint swelling and neck pain.  Skin:  Negative for rash.  Neurological:  Negative for dizziness, tremors, speech  difficulty and weakness.  Psychiatric/Behavioral:  Positive for dysphoric mood and sleep disturbance. Negative for agitation, decreased concentration and suicidal ideas. The patient is not nervous/anxious.     Objective:  BP 118/68   Pulse 64   Temp 97.7 F (36.5 C) (Oral)   Ht 6\' 2"  (1.88 m)   Wt 189 lb (85.7 kg)   SpO2 94%   BMI 24.27 kg/m   BP Readings from Last 3 Encounters:  08/17/23 118/68  04/27/23 110/70  04/01/23 136/73    Wt Readings from Last 3 Encounters:  08/17/23 189 lb (85.7 kg)  08/05/23 187 lb (84.8 kg)  04/27/23 184 lb (83.5 kg)    Physical Exam Constitutional:      General: He is not in acute  distress.    Appearance: He is well-developed.     Comments: NAD  HENT:     Head: Atraumatic.     Mouth/Throat:     Mouth: Mucous membranes are moist.     Pharynx: Oropharynx is clear.  Eyes:     Conjunctiva/sclera: Conjunctivae normal.     Pupils: Pupils are equal, round, and reactive to light.  Neck:     Thyroid: No thyromegaly.     Vascular: No JVD.  Cardiovascular:     Rate and Rhythm: Normal rate and regular rhythm.     Heart sounds: Normal heart sounds. No murmur heard.    No friction rub. No gallop.  Pulmonary:     Effort: Pulmonary effort is normal. No respiratory distress.     Breath sounds: Normal breath sounds. No wheezing or rales.  Chest:     Chest wall: No tenderness.  Abdominal:     General: Bowel sounds are normal. There is no distension.     Palpations: Abdomen is soft. There is no mass.     Tenderness: There is no abdominal tenderness. There is no guarding or rebound.  Musculoskeletal:        General: No tenderness. Normal range of motion.     Cervical back: Normal range of motion.  Lymphadenopathy:     Cervical: No cervical adenopathy.  Skin:    General: Skin is warm and dry.     Findings: No rash.  Neurological:     Mental Status: He is alert and oriented to person, place, and time.     Cranial Nerves: No cranial nerve deficit.     Motor: No abnormal muscle tone.     Coordination: Coordination normal.     Gait: Gait normal.     Deep Tendon Reflexes: Reflexes are normal and symmetric.  Psychiatric:        Behavior: Behavior normal.        Thought Content: Thought content normal.        Judgment: Judgment normal.   No mouth lesions  Lab Results  Component Value Date   WBC 9.6 05/27/2022   HGB 14.7 05/27/2022   HCT 44.3 05/27/2022   PLT 278.0 05/27/2022   GLUCOSE 116 (H) 04/27/2023   CHOL 185 11/10/2019   TRIG 78 11/10/2019   HDL 34 (L) 11/10/2019   LDLDIRECT 105.0 05/31/2019   LDLCALC 135 (H) 11/10/2019   ALT 24 04/27/2023   AST 26  04/27/2023   NA 140 04/27/2023   K 4.6 04/27/2023   CL 103 04/27/2023   CREATININE 1.09 04/27/2023   BUN 25 (H) 04/27/2023   CO2 30 04/27/2023   TSH 1.11 08/26/2022   PSA 0.30 05/31/2019   INR 1.0 11/09/2019  HGBA1C 6.2 04/27/2023    MR CERVICAL SPINE W WO CONTRAST Result Date: 02/01/2023  [facility] [address], [address] [place], [place] [address] [phone] NEUROIMAGING REPORT STUDY DATE: 02/01/2023 PATIENT NAME: Josey Forcier. DOB: 06/23/1940 MRN: [id number] EXAM: MRI of the cervical spine with and without contrast ORDERING CLINICIAN: Porfirio Mylar Dohmeier MD CLINICAL HISTORY: 83 year old man with cervical spinal stenosis COMPARISON FILMS: CT 07/03/2021 TECHNIQUE: MRI of the cervical spine was obtained utilizing 3 mm sagittal slices from the posterior fossa down to the T3-4 level with T1, T2 and inversion recovery views. In addition 4 mm axial slices from C2-3 down to T1-2 level were included with T2 and gradient echo views.  After the infusion of contrast, additional T1-weighted images were performed. CONTRAST: 8 mL Vueway IMAGING SITE: [facility], [address]:, [place], [place] FINDINGS: :  On sagittal images, the spine is imaged from above the cervicomedullary junction to T2.   The visible brain and the cervicomedullary junction appears normal.  The spinal cord appears normal.  Paravertebral soft tissue appears normal. There is 3 mm anterolisthesis of C4 upon C5, similar to the recent CT scan.  The patient has had C5-C7 ACDF..   The vertebral bodies have normal signal.  The discs and interspaces were further evaluated on axial views from C2 to T1 as follows: C2-C3: There is mild left facet hypertrophy causing minimal left foraminal narrowing but no spinal stenosis or nerve root compression. C3-C4: There is moderate left facet hypertrophy and small disc osteophyte complexes bilaterally and mild ligamenta flava hypertrophy causing moderate left and mild to  moderate right foraminal narrowing.  There is no spinal stenosis or nerve root compression. C4-C5: There is 3 mm anterolisthesis, severe right facet hypertrophy and minimal disc bulging and mild ligamenta flava hypertrophy causing mild spinal stenosis and mild to moderate right and minimal left foraminal narrowing.  There does not appear to be nerve root compression. C5-C6: This level is postoperative.  There is no spinal stenosis or nerve root compression. C6-C7: This level is postoperative.  There is no spinal stenosis or nerve root compression. C7-T1: Mild right facet hypertrophy.  The level is otherwise normal and there is no spinal stenosis or nerve root compression. After the infusion of contrast, a normal enhancement pattern is observed.   This MRI of the cervical spine with and without contrast shows the following: The spinal cord appears normal. At C3-C4, there are mild degenerative changes but no spinal stenosis or nerve root compression. At C4-C5, there is a 3 mm anterolisthesis and other degenerative change causing mild spinal stenosis but no nerve root compression. Remote ACDF at C5-C7.  No spinal stenosis or nerve root compression at these levels. After the infusion of contrast, a normal enhancement pattern is observed. INTERPRETING PHYSICIAN: Richard A. Epimenio Foot, MD, PhD, FAAN Certified in  Neuroimaging by [employer]    Assessment & Plan:   Problem List Items Addressed This Visit     Grief   Coping better.  Discussed      Hemiparesis affecting left side as late effect of cerebrovascular accident (CVA) (HCC)   F/u w/Dr Dohmeier  Minimalistic shoes, cane Continue with Plavix and blood pressure control      Gait instability   Continue to wear minimalistic shoes      Phantogeusia - Primary   Recurrent metallic taste in the mouth.  Likely postsurgical/radiation nerve damage.  We discussed the lack of existing therapies will try clonazepam low  dose was offered.   Continue with baking soda gargles Use [employer] Peroxicare tooth paste as needed          Meds ordered this encounter  Medications   clonazePAM (KLONOPIN) 0.5 MG tablet    Sig: Take 1-2 tablets (0.5-1 mg total) by mouth 3 times/day as needed-between meals & bedtime for anxiety (metallic taste in the mouth).    Dispense:  60 tablet    Refill:  2      Follow-up: Return in about 3 months (around 11/17/2023) for a follow-up visit.  Sonda Primes, MD

## 2023-08-17 NOTE — Assessment & Plan Note (Addendum)
 Recurrent metallic taste in the mouth.  Likely postsurgical/radiation nerve damage.  We discussed the lack of existing therapies will try clonazepam low dose was offered.  Continue with baking soda gargles Use [employer] Peroxicare tooth paste as needed

## 2023-08-22 NOTE — Assessment & Plan Note (Signed)
 F/u w/Dr Dohmeier  Minimalistic shoes, cane Continue with Plavix and blood pressure control

## 2023-08-22 NOTE — Assessment & Plan Note (Signed)
 Continue to wear minimalistic shoes

## 2023-08-22 NOTE — Assessment & Plan Note (Signed)
Coping better. Discussed

## 2023-08-22 NOTE — Assessment & Plan Note (Signed)
 Continue to monitor blood pressure

## 2023-09-01 DIAGNOSIS — H903 Sensorineural hearing loss, bilateral: Secondary | ICD-10-CM | POA: Diagnosis not present

## 2023-11-17 ENCOUNTER — Encounter: Payer: Self-pay | Admitting: Internal Medicine

## 2023-11-17 ENCOUNTER — Ambulatory Visit (INDEPENDENT_AMBULATORY_CARE_PROVIDER_SITE_OTHER): Admitting: Internal Medicine

## 2023-11-17 VITALS — BP 112/70 | HR 82 | Temp 98.0°F | Ht 74.0 in | Wt 188.0 lb

## 2023-11-17 DIAGNOSIS — K219 Gastro-esophageal reflux disease without esophagitis: Secondary | ICD-10-CM | POA: Diagnosis not present

## 2023-11-17 DIAGNOSIS — R000 Tachycardia, unspecified: Secondary | ICD-10-CM | POA: Insufficient documentation

## 2023-11-17 DIAGNOSIS — L304 Erythema intertrigo: Secondary | ICD-10-CM | POA: Insufficient documentation

## 2023-11-17 DIAGNOSIS — R438 Other disturbances of smell and taste: Secondary | ICD-10-CM | POA: Diagnosis not present

## 2023-11-17 DIAGNOSIS — R011 Cardiac murmur, unspecified: Secondary | ICD-10-CM

## 2023-11-17 DIAGNOSIS — R079 Chest pain, unspecified: Secondary | ICD-10-CM | POA: Diagnosis not present

## 2023-11-17 MED ORDER — CLOTRIMAZOLE-BETAMETHASONE 1-0.05 % EX CREA: 1.0000 | TOPICAL_CREAM | Freq: Every day | CUTANEOUS | 0 refills | Status: DC

## 2023-11-17 MED ORDER — PANTOPRAZOLE SODIUM 40 MG PO TBEC: 40.0000 mg | DELAYED_RELEASE_TABLET | Freq: Every day | ORAL | 3 refills | Status: AC

## 2023-11-17 MED ORDER — DILTIAZEM HCL 30 MG PO TABS: 30.0000 mg | ORAL_TABLET | Freq: Three times a day (TID) | ORAL | 1 refills | Status: DC | PRN

## 2023-11-17 NOTE — Assessment & Plan Note (Signed)
 New episode of rapid HR of 116 bpm last Fri on a stationary bike. Pt had severe GERD sx at the time. Alka Salzer helped Ed had CP w/it. It lasted x 2 days. EMT came. He did not sleep x 2 nights. He is normally bradycardic...  Probable PSVT vs other Given Cardizem 30 mg tid prn EKG - NSR ECHO ordered Cardiology consult

## 2023-11-17 NOTE — Assessment & Plan Note (Signed)
 Worse Increase Protonix  to BID

## 2023-11-17 NOTE — Progress Notes (Signed)
 Subjective:  Patient ID: Francis Adie., male    DOB: 1941-01-08  Age: 83 y.o. MRN: [id number]  CC: Medical Management of Chronic Issues (3 mnth f/u)   HPI Francis Sims. presents for an episode of rapid HR of 116 bpm last Fri on a stationary bike. Pt had severe GERD sx at the time. Alka Salzer helped Ed had CP w/it. It lasted x 2 days. EMT came. He did not sleep x 2 nights. He is normally bradycardic...  C/o rash on the buttocks  No angina  Outpatient Medications Prior to Visit  Medication Sig Dispense Refill   amLODipine  (NORVASC ) 2.5 MG tablet TAKE 1 TABLET BY MOUTH EVERY DAY 90 tablet 2   cholecalciferol  (VITAMIN D ) 1000 UNITS tablet Take 1,000 Units by mouth daily.     clonazePAM  (KLONOPIN ) 0.5 MG tablet Take 1-2 tablets (0.5-1 mg total) by mouth 3 times/day as needed-between meals & bedtime for anxiety (metallic taste in the mouth). 60 tablet 2   clopidogrel  (PLAVIX ) 75 MG tablet TAKE 1 TABLET BY MOUTH EVERY DAY 90 tablet 3   gabapentin  (NEURONTIN ) 100 MG capsule Take 100 mg by mouth 4 (four) times daily as needed.     gabapentin  (NEURONTIN ) 300 MG capsule Take 1 capsule (300 mg total) by mouth 2 (two) times daily. 180 capsule 1   hydroxypropyl methylcellulose / hypromellose (ISOPTO TEARS / GONIOVISC) 2.5 % ophthalmic solution Place 1 drop into both eyes 3 (three) times daily as needed for dry eyes.     hydrOXYzine  (ATARAX ) 25 MG tablet TAKE 1 TABLET BY MOUTH AT BEDTIME AS NEEDED FOR ITCHING. 90 tablet 1   losartan  (COZAAR ) 50 MG tablet TAKE 1 TABLET BY MOUTH DAILY 90 tablet 2   metFORMIN  (GLUCOPHAGE ) 500 MG tablet TAKE 1 TABLET BY MOUTH EVERY DAY WITH BREAKFAST 90 tablet 3   Omega-3 Fatty Acids (FISH OIL) 1000 MG CAPS Take 1,000 mg by mouth 2 (two) times daily.     oxybutynin  (DITROPAN ) 5 MG tablet TAKE 1 TABLET EVERY 8 HOURS AS NEED FOR BLADDER SPASMS (TO PREVENT URINARY SYMPTOMS WHEN YOU GO OUT) 270 tablet 1   oxyCODONE -acetaminophen  (PERCOCET/ROXICET) 5-325 MG  tablet Take 1 tablet by mouth every 4 (four) hours as needed for severe pain (Take 1 h before MRI. Ok to repeat in 4 hrs onc). 2 tablet 0   SENEXON-S 8.6-50 MG tablet TAKE 1 TO 2 TABLETS BY MOUTH DAILY 100 tablet 3   tolterodine  (DETROL  LA) 4 MG 24 hr capsule TAKE 1 CAPSULE BY MOUTH EVERY DAY 30 capsule 5   valACYclovir  (VALTREX ) 500 MG tablet TAKE 1 TABLET BY MOUTH TWICE A DAY FOR CANKER SORES 180 tablet 1   vitamin C  (ASCORBIC ACID ) 250 MG tablet Take 250 mg by mouth daily.     pantoprazole  (PROTONIX ) 40 MG tablet TAKE 1 TABLET BY MOUTH EVERY DAY 90 tablet 3   No facility-administered medications prior to visit.    ROS: Review of Systems  Constitutional:  Positive for fatigue. Negative for appetite change and unexpected weight change.  HENT:  Negative for congestion, nosebleeds, sneezing, sore throat and trouble swallowing.   Eyes:  Positive for visual disturbance. Negative for itching.  Respiratory:  Negative for cough.   Cardiovascular:  Positive for chest pain and palpitations. Negative for leg swelling.  Gastrointestinal:  Negative for abdominal distention, blood in stool, diarrhea and nausea.  Genitourinary:  Negative for frequency and hematuria.  Musculoskeletal:  Negative for back pain, gait problem,  joint swelling and neck pain.  Skin:  Negative for rash.  Neurological:  Positive for weakness. Negative for dizziness, tremors and speech difficulty.  Psychiatric/Behavioral:  Negative for agitation, dysphoric mood and sleep disturbance. The patient is not nervous/anxious.   GERD sx's  Objective:  BP 112/70   Pulse 82   Temp 98 F (36.7 C) (Oral)   Ht 6\' 2"  (1.88 m)   Wt 188 lb (85.3 kg)   SpO2 91%   BMI 24.14 kg/m   BP Readings from Last 3 Encounters:  11/17/23 112/70  08/17/23 118/68  04/27/23 110/70    Wt Readings from Last 3 Encounters:  11/17/23 188 lb (85.3 kg)  08/17/23 189 lb (85.7 kg)  08/05/23 187 lb (84.8 kg)    Physical Exam Constitutional:       General: He is not in acute distress.    Appearance: Normal appearance. He is well-developed.     Comments: NAD  Eyes:     Conjunctiva/sclera: Conjunctivae normal.     Pupils: Pupils are equal, round, and reactive to light.  Neck:     Thyroid : No thyromegaly.     Vascular: No JVD.  Cardiovascular:     Rate and Rhythm: Normal rate and regular rhythm.     Heart sounds: Murmur heard.     No friction rub. No gallop.  Pulmonary:     Effort: Pulmonary effort is normal. No respiratory distress.     Breath sounds: Normal breath sounds. No wheezing or rales.  Chest:     Chest wall: No tenderness.  Abdominal:     General: Bowel sounds are normal. There is no distension.     Palpations: Abdomen is soft. There is no mass.     Tenderness: There is no abdominal tenderness. There is no guarding or rebound.  Musculoskeletal:        General: No tenderness. Normal range of motion.     Cervical back: Normal range of motion.     Right lower leg: No edema.     Left lower leg: No edema.  Lymphadenopathy:     Cervical: No cervical adenopathy.  Skin:    General: Skin is warm and dry.     Findings: No rash.  Neurological:     Mental Status: He is alert and oriented to person, place, and time.     Cranial Nerves: No cranial nerve deficit.     Motor: Weakness present. No abnormal muscle tone.     Coordination: Coordination normal.     Gait: Gait normal.     Deep Tendon Reflexes: Reflexes are normal and symmetric.  Psychiatric:        Behavior: Behavior normal.        Thought Content: Thought content normal.        Judgment: Judgment normal.   A faint murmur cane Post-CVA hemiparesis  Procedure: EKG Indication: chest pain/tachy Impression: NSR. 72 bpm. LVH. No acute changes.    A total time of 45 minutes was spent preparing to see the patient, reviewing tests, x-rays  and other medical records.  Also, obtaining history and performing comprehensive physical exam.  Additionally, counseling the  patient regarding the above listed issues - CP, SVT, GERD.   Finally, documenting clinical information in the health records, coordination of care, educating the patient.   Lab Results  Component Value Date   WBC 9.6 05/27/2022   HGB 14.7 05/27/2022   HCT 44.3 05/27/2022   PLT 278.0 05/27/2022   GLUCOSE 116 (  H) 04/27/2023   CHOL 185 11/10/2019   TRIG 78 11/10/2019   HDL 34 (L) 11/10/2019   LDLDIRECT 105.0 05/31/2019   LDLCALC 135 (H) 11/10/2019   ALT 24 04/27/2023   AST 26 04/27/2023   NA 140 04/27/2023   K 4.6 04/27/2023   CL 103 04/27/2023   CREATININE 1.09 04/27/2023   BUN 25 (H) 04/27/2023   CO2 30 04/27/2023   TSH 1.11 08/26/2022   PSA 0.30 05/31/2019   INR 1.0 11/09/2019   HGBA1C 6.2 04/27/2023    MR CERVICAL SPINE W WO CONTRAST Result Date: 02/01/2023  [facility] [address], [address] [place], [place] [address] [phone] NEUROIMAGING REPORT STUDY DATE: 02/01/2023 PATIENT NAME: Francis Bayle. DOB: March 01, 1941 MRN: [id number] EXAM: MRI of the cervical spine with and without contrast ORDERING CLINICIAN: Raoul Byes Dohmeier MD CLINICAL HISTORY: 83 year old man with cervical spinal stenosis COMPARISON FILMS: CT 07/03/2021 TECHNIQUE: MRI of the cervical spine was obtained utilizing 3 mm sagittal slices from the posterior fossa down to the T3-4 level with T1, T2 and inversion recovery views. In addition 4 mm axial slices from C2-3 down to T1-2 level were included with T2 and gradient echo views.  After the infusion of contrast, additional T1-weighted images were performed. CONTRAST: 8 mL Vueway  IMAGING SITE: [facility], [address]:, [place], [place] FINDINGS: :  On sagittal images, the spine is imaged from above the cervicomedullary junction to T2.   The visible brain and the cervicomedullary junction appears normal.  The spinal cord appears normal.  Paravertebral soft tissue appears normal. There is 3 mm anterolisthesis of C4 upon C5, similar to the  recent CT scan.  The patient has had C5-C7 ACDF..   The vertebral bodies have normal signal.  The discs and interspaces were further evaluated on axial views from C2 to T1 as follows: C2-C3: There is mild left facet hypertrophy causing minimal left foraminal narrowing but no spinal stenosis or nerve root compression. C3-C4: There is moderate left facet hypertrophy and small disc osteophyte complexes bilaterally and mild ligamenta flava hypertrophy causing moderate left and mild to moderate right foraminal narrowing.  There is no spinal stenosis or nerve root compression. C4-C5: There is 3 mm anterolisthesis, severe right facet hypertrophy and minimal disc bulging and mild ligamenta flava hypertrophy causing mild spinal stenosis and mild to moderate right and minimal left foraminal narrowing.  There does not appear to be nerve root compression. C5-C6: This level is postoperative.  There is no spinal stenosis or nerve root compression. C6-C7: This level is postoperative.  There is no spinal stenosis or nerve root compression. C7-T1: Mild right facet hypertrophy.  The level is otherwise normal and there is no spinal stenosis or nerve root compression. After the infusion of contrast, a normal enhancement pattern is observed.   This MRI of the cervical spine with and without contrast shows the following: The spinal cord appears normal. At C3-C4, there are mild degenerative changes but no spinal stenosis or nerve root compression. At C4-C5, there is a 3 mm anterolisthesis and other degenerative change causing mild spinal stenosis but no nerve root compression. Remote ACDF at C5-C7.  No spinal stenosis or nerve root compression at these levels. After the infusion of contrast, a normal enhancement pattern is observed. INTERPRETING PHYSICIAN: Richard A. Godwin Lat, MD, PhD, FAAN Certified in  Neuroimaging by [employer]    Assessment & Plan:   Problem List Items Addressed This Visit  Chest pain -  Primary    New episode of rapid HR of 116 bpm last Fri on a stationary bike. He had some CP w/it. Pt had severe GERD sx at the time. Alka Salzer helped w/CP. CP lasted x 2 days. EMT came. He did not sleep x 2 nights.   Probable PSVT vs other Given Cardizem 30 mg tid prn EKG - NSR ECHO ordered Cardiology consult Call 911 if relapsed      Relevant Orders   EKG 12-Lead   ECHOCARDIOGRAM COMPLETE   Ambulatory referral to Cardiology   Phantogeusia   No change      Tachycardia    New episode of rapid HR of 116 bpm last Fri on a stationary bike. Pt had severe GERD sx at the time. Alka Salzer helped Ed had CP w/it. It lasted x 2 days. EMT came. He did not sleep x 2 nights. He is normally bradycardic...  Probable PSVT vs other Given Cardizem 30 mg tid prn EKG - NSR ECHO ordered Cardiology consult      Relevant Orders   ECHOCARDIOGRAM COMPLETE   Ambulatory referral to Cardiology   GERD (gastroesophageal reflux disease)   Worse Increase Protonix  to BID      Relevant Medications   pantoprazole  (PROTONIX ) 40 MG tablet   Other Visit Diagnoses       Heart murmur       Relevant Orders   ECHOCARDIOGRAM COMPLETE         Meds ordered this encounter  Medications   pantoprazole  (PROTONIX ) 40 MG tablet    Sig: Take 1 tablet (40 mg total) by mouth daily.    Dispense:  180 tablet    Refill:  3   clotrimazole-betamethasone (LOTRISONE) cream    Sig: Apply 1 Application topically daily.    Dispense:  30 g    Refill:  0   diltiazem (CARDIZEM) 30 MG tablet    Sig: Take 1 tablet (30 mg total) by mouth 3 (three) times daily as needed. For rapid heart beat    Dispense:  30 tablet    Refill:  1      Follow-up: Return in about 2 months (around 01/17/2024) for a follow-up visit.  Anitra Barn, MD

## 2023-11-17 NOTE — Assessment & Plan Note (Addendum)
 New episode of rapid HR of 116 bpm last Fri on a stationary bike. He had some CP w/it. Pt had severe GERD sx at the time. Alka Salzer helped w/CP. CP lasted x 2 days. EMT came. He did not sleep x 2 nights.   Probable PSVT vs other Given Cardizem 30 mg tid prn EKG - NSR ECHO ordered Cardiology consult Call 911 if relapsed

## 2023-11-17 NOTE — Assessment & Plan Note (Signed)
 New on buttocks - Lotrisone cream bid. Pt is using Depends Don't sit for too long

## 2023-11-17 NOTE — Assessment & Plan Note (Signed)
 No change

## 2023-11-24 ENCOUNTER — Other Ambulatory Visit: Payer: Self-pay | Admitting: Internal Medicine

## 2023-12-07 DIAGNOSIS — L570 Actinic keratosis: Secondary | ICD-10-CM | POA: Diagnosis not present

## 2023-12-07 DIAGNOSIS — Z85828 Personal history of other malignant neoplasm of skin: Secondary | ICD-10-CM | POA: Diagnosis not present

## 2023-12-07 DIAGNOSIS — D225 Melanocytic nevi of trunk: Secondary | ICD-10-CM | POA: Diagnosis not present

## 2023-12-07 DIAGNOSIS — L821 Other seborrheic keratosis: Secondary | ICD-10-CM | POA: Diagnosis not present

## 2023-12-07 DIAGNOSIS — L720 Epidermal cyst: Secondary | ICD-10-CM | POA: Diagnosis not present

## 2023-12-16 ENCOUNTER — Ambulatory Visit (HOSPITAL_COMMUNITY)
Admission: RE | Admit: 2023-12-16 | Discharge: 2023-12-16 | Disposition: A | Discharge status: Home / Self Care | Source: Ambulatory Visit | Attending: Cardiology | Admitting: Cardiology

## 2023-12-16 ENCOUNTER — Other Ambulatory Visit: Payer: Self-pay | Admitting: Cardiology

## 2023-12-16 ENCOUNTER — Ambulatory Visit: Admitting: Cardiology

## 2023-12-16 ENCOUNTER — Encounter (HOSPITAL_COMMUNITY): Payer: Self-pay | Admitting: Radiology

## 2023-12-16 ENCOUNTER — Encounter: Payer: Self-pay | Admitting: Cardiology

## 2023-12-16 VITALS — BP 138/77 | HR 58 | Ht 74.0 in | Wt 189.0 lb

## 2023-12-16 DIAGNOSIS — R011 Cardiac murmur, unspecified: Secondary | ICD-10-CM | POA: Diagnosis not present

## 2023-12-16 DIAGNOSIS — R079 Chest pain, unspecified: Secondary | ICD-10-CM | POA: Diagnosis not present

## 2023-12-16 DIAGNOSIS — R000 Tachycardia, unspecified: Secondary | ICD-10-CM | POA: Insufficient documentation

## 2023-12-16 DIAGNOSIS — Z01812 Encounter for preprocedural laboratory examination: Secondary | ICD-10-CM

## 2023-12-16 DIAGNOSIS — I200 Unstable angina: Secondary | ICD-10-CM

## 2023-12-16 LAB — LIPID PANEL

## 2023-12-16 LAB — ECHOCARDIOGRAM COMPLETE
Area-P 1/2: 2.87 cm2
S' Lateral: 4.5 cm

## 2023-12-16 MED ORDER — PERFLUTREN LIPID MICROSPHERE
1.0000 mL | INTRAVENOUS | Status: AC | PRN
  Administered 2023-12-16: 2 mL via INTRAVENOUS

## 2023-12-16 NOTE — Progress Notes (Signed)
 Echo reader of the day, Dr. Mona, notified imaging findings. He reviewed and made recommendation that patient be seen today by DOD 2, Dr. [place] if possible. Spoke with Dr. [place], he concurred. Patient was transferred to the 5th floor to be seen.

## 2023-12-16 NOTE — H&P (View-Only) (Signed)
 Cardiology Office Note:    Date:  12/16/2023   ID:  [name], DOB 1940-10-18, MRN [id number]  PCP:  Garald Karlynn GAILS, MD   [facility] Providers Cardiologist:  None     Referring MD: Garald Karlynn GAILS, MD   Chief Complaint  Patient presents with   Coronary Artery Disease   Chest Pain    History of Present Illness:    Francis Korol. is a 83 y.o. male seen at the request of Dr Garald for evaluation of chest pain and abnormal Echo. History of HTN and prior CVA. His of resection of oral cancer in the past.   He states that a month ago he was riding a recumbent bike at [employer]. He states his HR went up to 116 and he developed chest pressure. Went home. Thought he was having heart burn. Took Alka seltzer and states pain eventually resolved but lasted 6-8 hours. No recurrent chest pain or SOB since then. No palpitations. Was seen by Dr Garald on 6/4. Ecg chronically abnormal. Referred for Echo done today. This demonstrates significant LV dysfunction especially in LAD distribution with EF 35-40%. No mural thrombus. Findings new compared to 2021.   Patient lives alone (widowed). 2 daughters live in area. One son committed suicide. Patient is a retired Psychologist, educational in Dentist.   Past Medical History:  Diagnosis Date   Burping    per pt lots of burping   Chronic back pain    pinched nerve;buldging disc   Dry skin    on elbows;uses vasaline and it goes away   GERD (gastroesophageal reflux disease)    OTC   Hemorrhoids    Hx of colonic polyps    59yrs ago   Hypertension    Nocturia    PONV (postoperative nausea and vomiting)    Stroke (HCC) 10/2019    Past Surgical History:  Procedure Laterality Date   ALVEOLOPLASTY Left 02/14/2021   Procedure: EXCISION OF LEFT MANDIBULAR ALVEOLAR CANCER; INCLUDING SECOND MOLAR;  Surgeon: Jesus Oliphant, MD;  Location: [facility] OR;  Service: ENT;  Laterality: Left;   BACK SURGERY     BROW  LIFT Right 07/12/2013   Procedure: CONTRACTURE  RELEASE ZPLASTY OF RIGHT EYE, PERIORBITAL AREA WITH REPAIR OF PTOSIS OF RIGHT EYE BROW;  Surgeon: Estefana Reichert, DO;  Location: [facility];  Service: Plastics;  Laterality: Right;   CATARACT EXTRACTION  2008   bilateral   CERVICAL FUSION  2000   C5,6,7   COLONOSCOPY     EYE SURGERY     HERNIA REPAIR  1949   LUMBAR LAMINECTOMY/DECOMPRESSION MICRODISCECTOMY  05/26/2011   Procedure: LUMBAR LAMINECTOMY/DECOMPRESSION MICRODISCECTOMY;  Surgeon: Darina KIDD Kritzer;  Location: [facility];  Service: Neurosurgery;  Laterality: Right;  Right Lumbar three-four Microdiskectomy   TONSILLECTOMY      Current Medications: Current Meds  Medication Sig   amLODipine  (NORVASC ) 2.5 MG tablet TAKE 1 TABLET BY MOUTH EVERY DAY   cholecalciferol  (VITAMIN D ) 1000 UNITS tablet Take 1,000 Units by mouth daily.   clonazePAM  (KLONOPIN ) 0.5 MG tablet Take 1-2 tablets (0.5-1 mg total) by mouth 3 times/day as needed-between meals & bedtime for anxiety (metallic taste in the mouth).   clopidogrel  (PLAVIX ) 75 MG tablet TAKE 1 TABLET BY MOUTH EVERY DAY   clotrimazole -betamethasone  (LOTRISONE ) cream Apply 1 Application topically daily.   diltiazem  (CARDIZEM ) 30 MG tablet Take 1 tablet (30 mg total) by mouth 3 (three) times daily as  needed. For rapid heart beat   gabapentin  (NEURONTIN ) 100 MG capsule Take 100 mg by mouth 4 (four) times daily as needed.   gabapentin  (NEURONTIN ) 300 MG capsule Take 1 capsule (300 mg total) by mouth 2 (two) times daily.   hydroxypropyl methylcellulose / hypromellose (ISOPTO TEARS / GONIOVISC) 2.5 % ophthalmic solution Place 1 drop into both eyes 3 (three) times daily as needed for dry eyes.   hydrOXYzine  (ATARAX ) 25 MG tablet TAKE 1 TABLET BY MOUTH AT BEDTIME AS NEEDED FOR ITCHING.   losartan  (COZAAR ) 50 MG tablet TAKE 1 TABLET BY MOUTH DAILY   metFORMIN  (GLUCOPHAGE ) 500 MG tablet TAKE 1 TABLET BY MOUTH EVERY DAY WITH BREAKFAST    Omega-3 Fatty Acids (FISH OIL) 1000 MG CAPS Take 1,000 mg by mouth 2 (two) times daily.   oxybutynin  (DITROPAN ) 5 MG tablet TAKE 1 TABLET EVERY 8 HOURS AS NEED FOR BLADDER SPASMS (TO PREVENT URINARY SYMPTOMS WHEN YOU GO OUT)   oxyCODONE -acetaminophen  (PERCOCET/ROXICET) 5-325 MG tablet Take 1 tablet by mouth every 4 (four) hours as needed for severe pain (Take 1 h before MRI. Ok to repeat in 4 hrs onc).   pantoprazole  (PROTONIX ) 40 MG tablet Take 1 tablet (40 mg total) by mouth daily.   SENEXON-S 8.6-50 MG tablet TAKE 1 TO 2 TABLETS BY MOUTH DAILY   tolterodine  (DETROL  LA) 4 MG 24 hr capsule TAKE 1 CAPSULE BY MOUTH EVERY DAY   valACYclovir  (VALTREX ) 500 MG tablet TAKE 1 TABLET BY MOUTH TWICE A DAY FOR CANKER SORES   vitamin C  (ASCORBIC ACID ) 250 MG tablet Take 250 mg by mouth daily.     Allergies:   Penicillins, Sulfonamide derivatives, Metoprolol , Hydroxyzine , Ibuprofen , and Statins   Social History   Socioeconomic History   Marital status: Widowed    Spouse name: Not on file   Number of children: 2   Years of education: Not on file   Highest education level: Not on file  Occupational History   Occupation: retired Building services engineer  Tobacco Use   Smoking status: Former   Smokeless tobacco: Never   Tobacco comments:    in the 60's quit  Vaping Use   Vaping status: Never Used  Substance and Sexual Activity   Alcohol use: No    Alcohol/week: 0.0 standard drinks of alcohol   Drug use: No   Sexual activity: Yes  Other Topics Concern   Not on file  Social History Narrative   Regular  Exercise-yes: tennis, work outs      Lives at [employer] deceased      Retired Theatre stage manager   Social Drivers of Corporate investment banker Strain: Low Risk  (08/05/2023)   Overall Financial Resource Strain (CARDIA)    Difficulty of Paying Living Expenses: Not hard at all  Food Insecurity: No Food Insecurity (08/05/2023)   Hunger Vital Sign    Worried About Running Out of Food in the Last Year:  Never true    Ran Out of Food in the Last Year: Never true  Transportation Needs: No Transportation Needs (08/05/2023)   PRAPARE - Administrator, Civil Service (Medical): No    Lack of Transportation (Non-Medical): No  Physical Activity: Sufficiently Active (08/05/2023)   Exercise Vital Sign    Days of Exercise per Week: 2 days    Minutes of Exercise per Session: 90 min  Stress: No Stress Concern Present (08/05/2023)   [employer] of Occupational Health - Occupational Stress Questionnaire    Feeling  of Stress : Not at all  Social Connections: Moderately Isolated (08/05/2023)   Social Connection and Isolation Panel    Frequency of Communication with Friends and Family: More than three times a week    Frequency of Social Gatherings with Friends and Family: Twice a week    Attends Religious Services: Never    Database administrator or Organizations: Yes    Attends Banker Meetings: 1 to 4 times per year    Marital Status: Widowed     Family History: The patient's family history includes COPD in an other family member; Heart attack in his paternal grandfather. There is no history of Anesthesia problems, Hypotension, Malignant hyperthermia, or Pseudochol deficiency.  ROS:   Please see the history of present illness.     All other systems reviewed and are negative.  EKGs/Labs/Other Studies Reviewed:    The following studies were reviewed today: IMPRESSIONS     1. No apical mural thrombus with Definity contrast. Color doppler jet  noted in the basal membranous septum, not seen in all views, but cannot  r/o small perimembranous VSD. Left ventricular ejection fraction, by  estimation, is 35 to 40%. Left ventricular   ejection fraction by PLAX is 37 %. The left ventricle has moderately  decreased function. The left ventricle demonstrates regional wall motion  abnormalities (see scoring diagram/findings for description). There is  mild left ventricular  hypertrophy. Left  ventricular diastolic parameters are consistent with Grade I diastolic  dysfunction (impaired relaxation). There is severe hypokinesis of the left  ventricular, mid-apical anteroseptal wall, anterior wall, apical segment,  inferoapical segment and inferior  segment.   2. Right ventricular systolic function is hyperdynamic. The right  ventricular size is normal. There is normal pulmonary artery systolic  pressure. The estimated right ventricular systolic pressure is 32.0 mmHg.   3. The mitral valve is abnormal. Trivial mitral valve regurgitation.   4. The aortic valve is tricuspid. Aortic valve regurgitation is trivial.  Aortic valve sclerosis/calcification is present, without any evidence of  aortic stenosis.   5. The inferior vena cava is normal in size with <50% respiratory  variability, suggesting right atrial pressure of 8 mmHg.   Comparison(s): Changes from prior study are noted. 11/10/2019: LVEF 60-65%,  normal wall motion.  EKG Interpretation Date/Time:  Thursday December 16 2023 14:33:49 EDT Ventricular Rate:  58 PR Interval:  162 QRS Duration:  96 QT Interval:  450 QTC Calculation: 441 R Axis:   -62  Text Interpretation: Sinus bradycardia Incomplete right bundle branch block Left anterior fascicular block Moderate voltage criteria for LVH, may be normal variant ( R in aVL , Cornell product ) Septal infarct , age undetermined Possible Lateral infarct , age undetermined ST & T wave abnormality, consider anterior ischemia When compared with ECG of 25-Mar-2020 16:10, Significant changes have occurred Confirmed by [place], Penney Domanski [phone]) on 12/16/2023 4:26:02 PM   EKG Interpretation Date/Time:  Thursday December 16 2023 14:33:49 EDT Ventricular Rate:  58 PR Interval:  162 QRS Duration:  96 QT Interval:  450 QTC Calculation: 441 R Axis:   -62  Text Interpretation: Sinus bradycardia Incomplete right bundle branch block Left anterior fascicular block Moderate voltage  criteria for LVH, may be normal variant ( R in aVL , Cornell product ) Septal infarct , age undetermined Possible Lateral infarct , age undetermined ST & T wave abnormality, consider anterior ischemia When compared with ECG of 25-Mar-2020 16:10, Significant changes have occurred Confirmed by [place], Brysen Shankman [phone])  on 12/16/2023 4:26:02 PM   Recent Labs: 04/27/2023: ALT 24; BUN 25; Creatinine, Ser 1.09; Potassium 4.6; Sodium 140  Recent Lipid Panel    Component Value Date/Time   CHOL 185 11/10/2019 0644   TRIG 78 11/10/2019 0644   HDL 34 (L) 11/10/2019 0644   CHOLHDL 5.4 11/10/2019 0644   VLDL 16 11/10/2019 0644   LDLCALC 135 (H) 11/10/2019 0644   LDLDIRECT 105.0 05/31/2019 1148     Risk Assessment/Calculations:                Physical Exam:    VS:  BP 138/77 (BP Location: Right Arm)   Pulse (!) 58   Ht 6' 2 (1.88 m)   Wt 189 lb (85.7 kg)   SpO2 97%   BMI 24.27 kg/m     Wt Readings from Last 3 Encounters:  12/16/23 189 lb (85.7 kg)  11/17/23 188 lb (85.3 kg)  08/17/23 189 lb (85.7 kg)     GEN:  Well nourished, well developed in no acute distress HEENT: Normal NECK: No JVD; No carotid bruits LYMPHATICS: No lymphadenopathy CARDIAC: RRR, no murmurs, rubs, gallops RESPIRATORY:  Clear to auscultation without rales, wheezing or rhonchi  ABDOMEN: Soft, non-tender, non-distended MUSCULOSKELETAL:  No edema; No deformity  SKIN: Warm and dry NEUROLOGIC:  Alert and oriented x 3 PSYCHIATRIC:  Normal affect   ASSESSMENT:    1. Chest pain, unspecified type   2. Pre-procedure lab exam    PLAN:    In order of problems listed above:  Chest pain. Findings on Ecg and Echo suggest recent AMI. Patient had prolonged chest pain a month ago. EF moderately reduced. On Plavix  (from prior CVA) and amlodipine . Will add Crestor 10 mg daily. Recommend expedited cardiac cath will arrange for Monday July 7. The procedure and risks were reviewed including but not limited to death, myocardial  infarction, stroke, arrythmias, bleeding, transfusion, emergency surgery, dye allergy, or renal dysfunction. The patient voices understanding and is agreeable to proceed. HTN HLD will update labs. Start Crestor 10 mg daily Remote CVA CHF with moderate systolic dysfunction. Depending on results of cath will optimize GDMT for CHF      Informed Consent   Shared Decision Making/Informed Consent The risks [stroke (1 in 1000), death (1 in 1000), kidney failure [usually temporary] (1 in 500), bleeding (1 in 200), allergic reaction [possibly serious] (1 in 200)], benefits (diagnostic support and management of coronary artery disease) and alternatives of a cardiac catheterization were discussed in detail with Mr. Biello and he is willing to proceed.       Medication Adjustments/Labs and Tests Ordered: Current medicines are reviewed at length with the patient today.  Concerns regarding medicines are outlined above.  Orders Placed This Encounter  Procedures   Comp Met (CMET)   CBC w/Diff   Lipid panel   EKG 12-Lead   No orders of the defined types were placed in this encounter.   Patient Instructions  Medication Instructions:  Start Crestor 10 mg daily Continue all other medications *If you need a refill on your cardiac medications before your next appointment, please call your pharmacy*  Lab Work: Cmet,cbc,lipid panel today    Testing/Procedures: Cardiac Cath scheduled Monday 7/7 [facility] Arrive at 5:30 am     Follow instructions below  Follow-Up: At [facility], you and your health needs are our priority.  As part of our continuing mission to provide you with exceptional heart care, our providers are all part of one  team.  This team includes your primary Cardiologist (physician) and Advanced Practice Providers or APPs (Physician Assistants and Nurse Practitioners) who all work together to provide you with the care you need, when you need it.  Your next appointment:  After Cath    Provider:  Dr.Ranisha Allaire    [facility] A DEPT OF [facility] [address] A DEPT OF THE [place] [address] [place] [address] Dept: [phone] Loc: [phone]  Duvall Comes.  12/16/2023  You are scheduled for a Cardiac Catheterization on Monday, July 7 with Dr. Lonni End.  1. Please arrive at the [facility] (Main Entrance A) at [facility]: [address] [place], [place] [address] at 5:30 AM (This time is 2 hour(s) before your procedure to ensure your preparation).   Free valet parking service is available. You will check in at ADMITTING. The support person will be asked to wait in the waiting room.  It is OK to have someone drop you off and come back when you are ready to be discharged.    Special note: Every effort is made to have your procedure done on time. Please understand that emergencies sometimes delay scheduled procedures.  2. Diet: Do not eat solid foods after midnight.  The patient may have clear liquids until 5am upon the day of the procedure.  3. Labs: You will need to have blood drawn today  1st Floor   4. Medication instructions in preparation for your procedure:     Hold Metformin  day of cath and Hold 2 days after cath   Drink 16 oz water  on the way to hospital     On the morning of your procedure, take your Aspirin  81 mg and Plavix /Clopidogrel  and any morning medicines NOT listed above.  You may use sips of water .  5. Plan to go home the same day, you will only stay overnight if medically necessary. 6. Bring a current list of your medications and current insurance cards. 7. You MUST have a responsible person to drive you home. 8. Someone MUST be with you the first 24 hours after you arrive home or your discharge will be delayed. 9. Please wear clothes that are easy to get on and off and wear slip-on shoes.  Thank you for allowing us  to care for you!   --  [facility]   We recommend signing up for the patient portal called MyChart.  Sign up information is provided on this After Visit Summary.  MyChart is used to connect with patients for Virtual Visits (Telemedicine).  Patients are able to view lab/test results, encounter notes, upcoming appointments, etc.  Non-urgent messages can be sent to your provider as well.   To learn more about what you can do with MyChart, go to [email or web address].          Signed, Ambriana Selway [place], MD  12/16/2023 4:26 PM    [facility]

## 2023-12-16 NOTE — Progress Notes (Signed)
 Cardiology Office Note:    Date:  12/16/2023   ID:  Francis Sims., DOB 1940-10-18, MRN [id number]  PCP:  Garald Karlynn GAILS, MD   [facility] Providers Cardiologist:  None     Referring MD: Garald Karlynn GAILS, MD   Chief Complaint  Patient presents with   Coronary Artery Disease   Chest Pain    History of Present Illness:    Francis Sims. is a 83 y.o. male seen at the request of Dr Garald for evaluation of chest pain and abnormal Echo. History of HTN and prior CVA. His of resection of oral cancer in the past.   He states that a month ago he was riding a recumbent bike at [employer]. He states his HR went up to 116 and he developed chest pressure. Went home. Thought he was having heart burn. Took Alka seltzer and states pain eventually resolved but lasted 6-8 hours. No recurrent chest pain or SOB since then. No palpitations. Was seen by Dr Garald on 6/4. Ecg chronically abnormal. Referred for Echo done today. This demonstrates significant LV dysfunction especially in LAD distribution with EF 35-40%. No mural thrombus. Findings new compared to 2021.   Patient lives alone (widowed). 2 daughters live in area. One son committed suicide. Patient is a retired Psychologist, educational in Dentist.   Past Medical History:  Diagnosis Date   Burping    per pt lots of burping   Chronic back pain    pinched nerve;buldging disc   Dry skin    on elbows;uses vasaline and it goes away   GERD (gastroesophageal reflux disease)    OTC   Hemorrhoids    Hx of colonic polyps    59yrs ago   Hypertension    Nocturia    PONV (postoperative nausea and vomiting)    Stroke (HCC) 10/2019    Past Surgical History:  Procedure Laterality Date   ALVEOLOPLASTY Left 02/14/2021   Procedure: EXCISION OF LEFT MANDIBULAR ALVEOLAR CANCER; INCLUDING SECOND MOLAR;  Surgeon: Jesus Oliphant, MD;  Location: [facility] OR;  Service: ENT;  Laterality: Left;   BACK SURGERY     BROW  LIFT Right 07/12/2013   Procedure: CONTRACTURE  RELEASE ZPLASTY OF RIGHT EYE, PERIORBITAL AREA WITH REPAIR OF PTOSIS OF RIGHT EYE BROW;  Surgeon: Estefana Reichert, DO;  Location: [facility];  Service: Plastics;  Laterality: Right;   CATARACT EXTRACTION  2008   bilateral   CERVICAL FUSION  2000   C5,6,7   COLONOSCOPY     EYE SURGERY     HERNIA REPAIR  1949   LUMBAR LAMINECTOMY/DECOMPRESSION MICRODISCECTOMY  05/26/2011   Procedure: LUMBAR LAMINECTOMY/DECOMPRESSION MICRODISCECTOMY;  Surgeon: Darina KIDD Kritzer;  Location: [facility];  Service: Neurosurgery;  Laterality: Right;  Right Lumbar three-four Microdiskectomy   TONSILLECTOMY      Current Medications: Current Meds  Medication Sig   amLODipine  (NORVASC ) 2.5 MG tablet TAKE 1 TABLET BY MOUTH EVERY DAY   cholecalciferol  (VITAMIN D ) 1000 UNITS tablet Take 1,000 Units by mouth daily.   clonazePAM  (KLONOPIN ) 0.5 MG tablet Take 1-2 tablets (0.5-1 mg total) by mouth 3 times/day as needed-between meals & bedtime for anxiety (metallic taste in the mouth).   clopidogrel  (PLAVIX ) 75 MG tablet TAKE 1 TABLET BY MOUTH EVERY DAY   clotrimazole -betamethasone  (LOTRISONE ) cream Apply 1 Application topically daily.   diltiazem  (CARDIZEM ) 30 MG tablet Take 1 tablet (30 mg total) by mouth 3 (three) times daily as  needed. For rapid heart beat   gabapentin  (NEURONTIN ) 100 MG capsule Take 100 mg by mouth 4 (four) times daily as needed.   gabapentin  (NEURONTIN ) 300 MG capsule Take 1 capsule (300 mg total) by mouth 2 (two) times daily.   hydroxypropyl methylcellulose / hypromellose (ISOPTO TEARS / GONIOVISC) 2.5 % ophthalmic solution Place 1 drop into both eyes 3 (three) times daily as needed for dry eyes.   hydrOXYzine  (ATARAX ) 25 MG tablet TAKE 1 TABLET BY MOUTH AT BEDTIME AS NEEDED FOR ITCHING.   losartan  (COZAAR ) 50 MG tablet TAKE 1 TABLET BY MOUTH DAILY   metFORMIN  (GLUCOPHAGE ) 500 MG tablet TAKE 1 TABLET BY MOUTH EVERY DAY WITH BREAKFAST    Omega-3 Fatty Acids (FISH OIL) 1000 MG CAPS Take 1,000 mg by mouth 2 (two) times daily.   oxybutynin  (DITROPAN ) 5 MG tablet TAKE 1 TABLET EVERY 8 HOURS AS NEED FOR BLADDER SPASMS (TO PREVENT URINARY SYMPTOMS WHEN YOU GO OUT)   oxyCODONE -acetaminophen  (PERCOCET/ROXICET) 5-325 MG tablet Take 1 tablet by mouth every 4 (four) hours as needed for severe pain (Take 1 h before MRI. Ok to repeat in 4 hrs onc).   pantoprazole  (PROTONIX ) 40 MG tablet Take 1 tablet (40 mg total) by mouth daily.   SENEXON-S 8.6-50 MG tablet TAKE 1 TO 2 TABLETS BY MOUTH DAILY   tolterodine  (DETROL  LA) 4 MG 24 hr capsule TAKE 1 CAPSULE BY MOUTH EVERY DAY   valACYclovir  (VALTREX ) 500 MG tablet TAKE 1 TABLET BY MOUTH TWICE A DAY FOR CANKER SORES   vitamin C  (ASCORBIC ACID ) 250 MG tablet Take 250 mg by mouth daily.     Allergies:   Penicillins, Sulfonamide derivatives, Metoprolol , Hydroxyzine , Ibuprofen , and Statins   Social History   Socioeconomic History   Marital status: Widowed    Spouse name: Not on file   Number of children: 2   Years of education: Not on file   Highest education level: Not on file  Occupational History   Occupation: retired Building services engineer  Tobacco Use   Smoking status: Former   Smokeless tobacco: Never   Tobacco comments:    in the 60's quit  Vaping Use   Vaping status: Never Used  Substance and Sexual Activity   Alcohol use: No    Alcohol/week: 0.0 standard drinks of alcohol   Drug use: No   Sexual activity: Yes  Other Topics Concern   Not on file  Social History Narrative   Regular  Exercise-yes: tennis, work outs      Lives at [employer] deceased      Retired Theatre stage manager   Social Drivers of Corporate investment banker Strain: Low Risk  (08/05/2023)   Overall Financial Resource Strain (CARDIA)    Difficulty of Paying Living Expenses: Not hard at all  Food Insecurity: No Food Insecurity (08/05/2023)   Hunger Vital Sign    Worried About Running Out of Food in the Last Year:  Never true    Ran Out of Food in the Last Year: Never true  Transportation Needs: No Transportation Needs (08/05/2023)   PRAPARE - Administrator, Civil Service (Medical): No    Lack of Transportation (Non-Medical): No  Physical Activity: Sufficiently Active (08/05/2023)   Exercise Vital Sign    Days of Exercise per Week: 2 days    Minutes of Exercise per Session: 90 min  Stress: No Stress Concern Present (08/05/2023)   [employer] of Occupational Health - Occupational Stress Questionnaire    Feeling  of Stress : Not at all  Social Connections: Moderately Isolated (08/05/2023)   Social Connection and Isolation Panel    Frequency of Communication with Friends and Family: More than three times a week    Frequency of Social Gatherings with Friends and Family: Twice a week    Attends Religious Services: Never    Database administrator or Organizations: Yes    Attends Banker Meetings: 1 to 4 times per year    Marital Status: Widowed     Family History: The patient's family history includes COPD in an other family member; Heart attack in his paternal grandfather. There is no history of Anesthesia problems, Hypotension, Malignant hyperthermia, or Pseudochol deficiency.  ROS:   Please see the history of present illness.     All other systems reviewed and are negative.  EKGs/Labs/Other Studies Reviewed:    The following studies were reviewed today: IMPRESSIONS     1. No apical mural thrombus with Definity contrast. Color doppler jet  noted in the basal membranous septum, not seen in all views, but cannot  r/o small perimembranous VSD. Left ventricular ejection fraction, by  estimation, is 35 to 40%. Left ventricular   ejection fraction by PLAX is 37 %. The left ventricle has moderately  decreased function. The left ventricle demonstrates regional wall motion  abnormalities (see scoring diagram/findings for description). There is  mild left ventricular  hypertrophy. Left  ventricular diastolic parameters are consistent with Grade I diastolic  dysfunction (impaired relaxation). There is severe hypokinesis of the left  ventricular, mid-apical anteroseptal wall, anterior wall, apical segment,  inferoapical segment and inferior  segment.   2. Right ventricular systolic function is hyperdynamic. The right  ventricular size is normal. There is normal pulmonary artery systolic  pressure. The estimated right ventricular systolic pressure is 32.0 mmHg.   3. The mitral valve is abnormal. Trivial mitral valve regurgitation.   4. The aortic valve is tricuspid. Aortic valve regurgitation is trivial.  Aortic valve sclerosis/calcification is present, without any evidence of  aortic stenosis.   5. The inferior vena cava is normal in size with <50% respiratory  variability, suggesting right atrial pressure of 8 mmHg.   Comparison(s): Changes from prior study are noted. 11/10/2019: LVEF 60-65%,  normal wall motion.  EKG Interpretation Date/Time:  Thursday December 16 2023 14:33:49 EDT Ventricular Rate:  58 PR Interval:  162 QRS Duration:  96 QT Interval:  450 QTC Calculation: 441 R Axis:   -62  Text Interpretation: Sinus bradycardia Incomplete right bundle branch block Left anterior fascicular block Moderate voltage criteria for LVH, may be normal variant ( R in aVL , Cornell product ) Septal infarct , age undetermined Possible Lateral infarct , age undetermined ST & T wave abnormality, consider anterior ischemia When compared with ECG of 25-Mar-2020 16:10, Significant changes have occurred Confirmed by [place], Penney Domanski [phone]) on 12/16/2023 4:26:02 PM   EKG Interpretation Date/Time:  Thursday December 16 2023 14:33:49 EDT Ventricular Rate:  58 PR Interval:  162 QRS Duration:  96 QT Interval:  450 QTC Calculation: 441 R Axis:   -62  Text Interpretation: Sinus bradycardia Incomplete right bundle branch block Left anterior fascicular block Moderate voltage  criteria for LVH, may be normal variant ( R in aVL , Cornell product ) Septal infarct , age undetermined Possible Lateral infarct , age undetermined ST & T wave abnormality, consider anterior ischemia When compared with ECG of 25-Mar-2020 16:10, Significant changes have occurred Confirmed by [place], Brysen Shankman [id number])  on 12/16/2023 4:26:02 PM   Recent Labs: 04/27/2023: ALT 24; BUN 25; Creatinine, Ser 1.09; Potassium 4.6; Sodium 140  Recent Lipid Panel    Component Value Date/Time   CHOL 185 11/10/2019 0644   TRIG 78 11/10/2019 0644   HDL 34 (L) 11/10/2019 0644   CHOLHDL 5.4 11/10/2019 0644   VLDL 16 11/10/2019 0644   LDLCALC 135 (H) 11/10/2019 0644   LDLDIRECT 105.0 05/31/2019 1148     Risk Assessment/Calculations:                Physical Exam:    VS:  BP 138/77 (BP Location: Right Arm)   Pulse (!) 58   Ht 6' 2 (1.88 m)   Wt 189 lb (85.7 kg)   SpO2 97%   BMI 24.27 kg/m     Wt Readings from Last 3 Encounters:  12/16/23 189 lb (85.7 kg)  11/17/23 188 lb (85.3 kg)  08/17/23 189 lb (85.7 kg)     GEN:  Well nourished, well developed in no acute distress HEENT: Normal NECK: No JVD; No carotid bruits LYMPHATICS: No lymphadenopathy CARDIAC: RRR, no murmurs, rubs, gallops RESPIRATORY:  Clear to auscultation without rales, wheezing or rhonchi  ABDOMEN: Soft, non-tender, non-distended MUSCULOSKELETAL:  No edema; No deformity  SKIN: Warm and dry NEUROLOGIC:  Alert and oriented x 3 PSYCHIATRIC:  Normal affect   ASSESSMENT:    1. Chest pain, unspecified type   2. Pre-procedure lab exam    PLAN:    In order of problems listed above:  Chest pain. Findings on Ecg and Echo suggest recent AMI. Patient had prolonged chest pain a month ago. EF moderately reduced. On Plavix  (from prior CVA) and amlodipine . Will add Crestor 10 mg daily. Recommend expedited cardiac cath will arrange for Monday July 7. The procedure and risks were reviewed including but not limited to death, myocardial  infarction, stroke, arrythmias, bleeding, transfusion, emergency surgery, dye allergy, or renal dysfunction. The patient voices understanding and is agreeable to proceed. HTN HLD will update labs. Start Crestor 10 mg daily Remote CVA CHF with moderate systolic dysfunction. Depending on results of cath will optimize GDMT for CHF      Informed Consent   Shared Decision Making/Informed Consent The risks [stroke (1 in 1000), death (1 in 1000), kidney failure [usually temporary] (1 in 500), bleeding (1 in 200), allergic reaction [possibly serious] (1 in 200)], benefits (diagnostic support and management of coronary artery disease) and alternatives of a cardiac catheterization were discussed in detail with Mr. Biello and he is willing to proceed.       Medication Adjustments/Labs and Tests Ordered: Current medicines are reviewed at length with the patient today.  Concerns regarding medicines are outlined above.  Orders Placed This Encounter  Procedures   Comp Met (CMET)   CBC w/Diff   Lipid panel   EKG 12-Lead   No orders of the defined types were placed in this encounter.   Patient Instructions  Medication Instructions:  Start Crestor 10 mg daily Continue all other medications *If you need a refill on your cardiac medications before your next appointment, please call your pharmacy*  Lab Work: Cmet,cbc,lipid panel today    Testing/Procedures: Cardiac Cath scheduled Monday 7/7 [facility] Arrive at 5:30 am     Follow instructions below  Follow-Up: At [facility], you and your health needs are our priority.  As part of our continuing mission to provide you with exceptional heart care, our providers are all part of one  team.  This team includes your primary Cardiologist (physician) and Advanced Practice Providers or APPs (Physician Assistants and Nurse Practitioners) who all work together to provide you with the care you need, when you need it.  Your next appointment:  After Cath    Provider:  Dr.Ranisha Allaire    [facility] A DEPT OF [facility] AT [address] A DEPT OF THE [place] [address] [place] [address] Dept: [phone] Loc: [phone]  Duvall Comes.  12/16/2023  You are scheduled for a Cardiac Catheterization on Monday, July 7 with Dr. Lonni End.  1. Please arrive at the [facility] (Main Entrance A) at [facility]: [address] [place], [place] [address] at 5:30 AM (This time is 2 hour(s) before your procedure to ensure your preparation).   Free valet parking service is available. You will check in at ADMITTING. The support person will be asked to wait in the waiting room.  It is OK to have someone drop you off and come back when you are ready to be discharged.    Special note: Every effort is made to have your procedure done on time. Please understand that emergencies sometimes delay scheduled procedures.  2. Diet: Do not eat solid foods after midnight.  The patient may have clear liquids until 5am upon the day of the procedure.  3. Labs: You will need to have blood drawn today  1st Floor   4. Medication instructions in preparation for your procedure:     Hold Metformin  day of cath and Hold 2 days after cath   Drink 16 oz water  on the way to hospital     On the morning of your procedure, take your Aspirin  81 mg and Plavix /Clopidogrel  and any morning medicines NOT listed above.  You may use sips of water .  5. Plan to go home the same day, you will only stay overnight if medically necessary. 6. Bring a current list of your medications and current insurance cards. 7. You MUST have a responsible person to drive you home. 8. Someone MUST be with you the first 24 hours after you arrive home or your discharge will be delayed. 9. Please wear clothes that are easy to get on and off and wear slip-on shoes.  Thank you for allowing us  to care for you!   --  [place] [facility]   We recommend signing up for the patient portal called MyChart.  Sign up information is provided on this After Visit Summary.  MyChart is used to connect with patients for Virtual Visits (Telemedicine).  Patients are able to view lab/test results, encounter notes, upcoming appointments, etc.  Non-urgent messages can be sent to your provider as well.   To learn more about what you can do with MyChart, go to [email or web address].          Signed, Ambriana Selway [place], MD  12/16/2023 4:26 PM    [facility]

## 2023-12-16 NOTE — Patient Instructions (Signed)
 Medication Instructions:  Start Crestor 10 mg daily Continue all other medications *If you need a refill on your cardiac medications before your next appointment, please call your pharmacy*  Lab Work: Cmet,cbc,lipid panel today    Testing/Procedures: Cardiac Cath scheduled Monday 7/7 [facility] Arrive at 5:30 am     Follow instructions below  Follow-Up: At [facility], you and your health needs are our priority.  As part of our continuing mission to provide you with exceptional heart care, our providers are all part of one team.  This team includes your primary Cardiologist (physician) and Advanced Practice Providers or APPs (Physician Assistants and Nurse Practitioners) who all work together to provide you with the care you need, when you need it.  Your next appointment: After Cath    Provider:  Dr.Jordan    [facility] A DEPT OF [facility]ARTCARE AT [address] A DEPT OF THE [place] [address] [place] [address] Dept: [phone] Loc: [phone]  Francis Sims.  12/16/2023  You are scheduled for a Cardiac Catheterization on Monday, July 7 with Dr. Lonni End.  1. Please arrive at the [facility] (Main Entrance A) at [facility]: [address] [place], [place] [address] at 5:30 AM (This time is 2 hour(s) before your procedure to ensure your preparation).   Free valet parking service is available. You will check in at ADMITTING. The support person will be asked to wait in the waiting room.  It is OK to have someone drop you off and come back when you are ready to be discharged.    Special note: Every effort is made to have your procedure done on time. Please understand that emergencies sometimes delay scheduled procedures.  2. Diet: Do not eat solid foods after midnight.  The patient may have clear liquids until 5am upon the day of the procedure.  3. Labs: You will need to have blood drawn  today  1st Floor   4. Medication instructions in preparation for your procedure:     Hold Metformin  day of cath and Hold 2 days after cath   Drink 16 oz water  on the way to hospital     On the morning of your procedure, take your Aspirin  81 mg and Plavix /Clopidogrel  and any morning medicines NOT listed above.  You may use sips of water .  5. Plan to go home the same day, you will only stay overnight if medically necessary. 6. Bring a current list of your medications and current insurance cards. 7. You MUST have a responsible person to drive you home. 8. Someone MUST be with you the first 24 hours after you arrive home or your discharge will be delayed. 9. Please wear clothes that are easy to get on and off and wear slip-on shoes.  Thank you for allowing us  to care for you!   --  Invasive Cardiovascular services   We recommend signing up for the patient portal called MyChart.  Sign up information is provided on this After Visit Summary.  MyChart is used to connect with patients for Virtual Visits (Telemedicine).  Patients are able to view lab/test results, encounter notes, upcoming appointments, etc.  Non-urgent messages can be sent to your provider as well.   To learn more about what you can do with MyChart, go to [email or web address].

## 2023-12-17 ENCOUNTER — Ambulatory Visit: Payer: Self-pay | Admitting: Cardiology

## 2023-12-17 LAB — CBC WITH DIFFERENTIAL/PLATELET
Basophils Absolute: 0.1 x10E3/uL (ref 0.0–0.2)
Basos: 1 %
EOS (ABSOLUTE): 0.2 x10E3/uL (ref 0.0–0.4)
Eos: 2 %
Hematocrit: 48.1 % (ref 37.5–51.0)
Hemoglobin: 15.9 g/dL (ref 13.0–17.7)
Immature Grans (Abs): 0 x10E3/uL (ref 0.0–0.1)
Immature Granulocytes: 0 %
Lymphocytes Absolute: 2.3 x10E3/uL (ref 0.7–3.1)
Lymphs: 27 %
MCH: 28.7 pg (ref 26.6–33.0)
MCHC: 33.1 g/dL (ref 31.5–35.7)
MCV: 87 fL (ref 79–97)
Monocytes Absolute: 0.7 x10E3/uL (ref 0.1–0.9)
Monocytes: 8 %
Neutrophils Absolute: 5.4 x10E3/uL (ref 1.4–7.0)
Neutrophils: 62 %
Platelets: 234 x10E3/uL (ref 150–450)
RBC: 5.54 x10E6/uL (ref 4.14–5.80)
RDW: 13.4 % (ref 11.6–15.4)
WBC: 8.7 x10E3/uL (ref 3.4–10.8)

## 2023-12-17 LAB — LIPID PANEL
Chol/HDL Ratio: 5.2 ratio — ABNORMAL HIGH (ref 0.0–5.0)
Cholesterol, Total: 188 mg/dL (ref 100–199)
HDL: 36 mg/dL — ABNORMAL LOW (ref >39)
LDL Chol Calc (NIH): 118 mg/dL — ABNORMAL HIGH (ref 0–99)
Triglycerides: 194 mg/dL — ABNORMAL HIGH (ref 0–149)
VLDL Cholesterol Cal: 34 mg/dL (ref 5–40)

## 2023-12-17 LAB — COMPREHENSIVE METABOLIC PANEL WITH GFR
ALT: 17 IU/L (ref 0–44)
AST: 22 IU/L (ref 0–40)
Albumin: 4.7 g/dL (ref 3.8–4.8)
Alkaline Phosphatase: 104 IU/L (ref 44–121)
BUN/Creatinine Ratio: 22 (ref 10–24)
BUN: 21 mg/dL (ref 8–27)
Bilirubin Total: 0.9 mg/dL (ref 0.0–1.2)
CO2: 24 mmol/L (ref 20–29)
Calcium: 10.1 mg/dL (ref 8.6–10.2)
Chloride: 101 mmol/L (ref 96–106)
Creatinine, Ser: 0.97 mg/dL (ref 0.76–1.27)
Globulin, Total: 2.5 g/dL (ref 1.5–4.5)
Glucose: 98 mg/dL (ref 70–99)
Potassium: 4.8 mmol/L (ref 3.5–5.2)
Sodium: 140 mmol/L (ref 134–144)
Total Protein: 7.2 g/dL (ref 6.0–8.5)
eGFR: 79 mL/min/1.73 (ref >59)

## 2023-12-19 ENCOUNTER — Ambulatory Visit: Payer: Self-pay | Admitting: Internal Medicine

## 2023-12-20 ENCOUNTER — Encounter (HOSPITAL_COMMUNITY)
Admission: RE | Disposition: A | Discharge status: Home / Self Care | Payer: Self-pay | Source: Home / Self Care | Attending: Internal Medicine

## 2023-12-20 ENCOUNTER — Ambulatory Visit (HOSPITAL_COMMUNITY)
Admission: RE | Admit: 2023-12-20 | Discharge: 2023-12-20 | Disposition: A | Discharge status: Home / Self Care | Attending: Internal Medicine | Admitting: Internal Medicine

## 2023-12-20 ENCOUNTER — Other Ambulatory Visit: Payer: Self-pay

## 2023-12-20 DIAGNOSIS — R0602 Shortness of breath: Secondary | ICD-10-CM | POA: Diagnosis not present

## 2023-12-20 DIAGNOSIS — Z8673 Personal history of transient ischemic attack (TIA), and cerebral infarction without residual deficits: Secondary | ICD-10-CM | POA: Insufficient documentation

## 2023-12-20 DIAGNOSIS — Z87891 Personal history of nicotine dependence: Secondary | ICD-10-CM | POA: Insufficient documentation

## 2023-12-20 DIAGNOSIS — I509 Heart failure, unspecified: Secondary | ICD-10-CM | POA: Diagnosis not present

## 2023-12-20 DIAGNOSIS — E785 Hyperlipidemia, unspecified: Secondary | ICD-10-CM | POA: Insufficient documentation

## 2023-12-20 DIAGNOSIS — Z79899 Other long term (current) drug therapy: Secondary | ICD-10-CM | POA: Diagnosis not present

## 2023-12-20 DIAGNOSIS — I2510 Atherosclerotic heart disease of native coronary artery without angina pectoris: Secondary | ICD-10-CM | POA: Diagnosis not present

## 2023-12-20 DIAGNOSIS — I429 Cardiomyopathy, unspecified: Secondary | ICD-10-CM | POA: Diagnosis not present

## 2023-12-20 DIAGNOSIS — I2582 Chronic total occlusion of coronary artery: Secondary | ICD-10-CM | POA: Diagnosis not present

## 2023-12-20 DIAGNOSIS — I200 Unstable angina: Secondary | ICD-10-CM

## 2023-12-20 DIAGNOSIS — I110 Hypertensive heart disease with heart failure: Secondary | ICD-10-CM | POA: Insufficient documentation

## 2023-12-20 DIAGNOSIS — R079 Chest pain, unspecified: Secondary | ICD-10-CM | POA: Diagnosis present

## 2023-12-20 HISTORY — PX: RIGHT/LEFT HEART CATH AND CORONARY ANGIOGRAPHY: CATH118266

## 2023-12-20 LAB — POCT I-STAT 7, (LYTES, BLD GAS, ICA,H+H)
Acid-base deficit: 1 mmol/L (ref 0.0–2.0)
Bicarbonate: 24.1 mmol/L (ref 20.0–28.0)
Calcium, Ion: 1.21 mmol/L (ref 1.15–1.40)
HCT: 43 % (ref 39.0–52.0)
Hemoglobin: 14.6 g/dL (ref 13.0–17.0)
O2 Saturation: 98 %
Potassium: 4.1 mmol/L (ref 3.5–5.1)
Sodium: 138 mmol/L (ref 135–145)
TCO2: 25 mmol/L (ref 22–32)
pCO2 arterial: 39.2 mmHg (ref 32–48)
pH, Arterial: 7.398 (ref 7.35–7.45)
pO2, Arterial: 101 mmHg (ref 83–108)

## 2023-12-20 LAB — POCT I-STAT EG7
Acid-base deficit: 4 mmol/L — ABNORMAL HIGH (ref 0.0–2.0)
Bicarbonate: 22.6 mmol/L (ref 20.0–28.0)
Calcium, Ion: 1.12 mmol/L — ABNORMAL LOW (ref 1.15–1.40)
HCT: 41 % (ref 39.0–52.0)
Hemoglobin: 13.9 g/dL (ref 13.0–17.0)
O2 Saturation: 64 %
Potassium: 3.7 mmol/L (ref 3.5–5.1)
Sodium: 131 mmol/L — ABNORMAL LOW (ref 135–145)
TCO2: 24 mmol/L (ref 22–32)
pCO2, Ven: 45.2 mmHg (ref 44–60)
pH, Ven: 7.306 (ref 7.25–7.43)
pO2, Ven: 36 mmHg (ref 32–45)

## 2023-12-20 SURGERY — RIGHT/LEFT HEART CATH AND CORONARY ANGIOGRAPHY
Anesthesia: LOCAL

## 2023-12-20 MED ORDER — HEPARIN SODIUM (PORCINE) 1000 UNIT/ML IJ SOLN
INTRAMUSCULAR | Status: AC
  Filled 2023-12-20: qty 10

## 2023-12-20 MED ORDER — ACETAMINOPHEN 325 MG PO TABS: 650.0000 mg | ORAL_TABLET | ORAL | Status: DC | PRN

## 2023-12-20 MED ORDER — SODIUM CHLORIDE 0.9% FLUSH: 3.0000 mL | INTRAVENOUS | Status: DC | PRN

## 2023-12-20 MED ORDER — SODIUM CHLORIDE 0.9% FLUSH: 3.0000 mL | Freq: Two times a day (BID) | INTRAVENOUS | Status: DC

## 2023-12-20 MED ORDER — LIDOCAINE HCL (PF) 1 % IJ SOLN
INTRAMUSCULAR | Status: AC
  Filled 2023-12-20: qty 30

## 2023-12-20 MED ORDER — VERAPAMIL HCL 2.5 MG/ML IV SOLN
INTRAVENOUS | Status: AC
  Filled 2023-12-20: qty 2

## 2023-12-20 MED ORDER — ONDANSETRON HCL 4 MG/2ML IJ SOLN: 4.0000 mg | Freq: Four times a day (QID) | INTRAMUSCULAR | Status: DC | PRN

## 2023-12-20 MED ORDER — LOSARTAN POTASSIUM 50 MG PO TABS: 75.0000 mg | ORAL_TABLET | Freq: Every day | ORAL | Status: DC

## 2023-12-20 MED ORDER — SODIUM CHLORIDE 0.9 % IV SOLN: 250.0000 mL | INTRAVENOUS | Status: DC | PRN

## 2023-12-20 MED ORDER — MIDAZOLAM HCL 2 MG/2ML IJ SOLN
INTRAMUSCULAR | Status: DC | PRN
  Administered 2023-12-20: 1 mg via INTRAVENOUS

## 2023-12-20 MED ORDER — LIDOCAINE HCL (PF) 1 % IJ SOLN
INTRAMUSCULAR | Status: DC | PRN
  Administered 2023-12-20 (×2): 2 mL via INTRAPLEURAL

## 2023-12-20 MED ORDER — HEPARIN (PORCINE) IN NACL 1000-0.9 UT/500ML-% IV SOLN
INTRAVENOUS | Status: DC | PRN
  Administered 2023-12-20 (×2): 500 mL

## 2023-12-20 MED ORDER — SODIUM CHLORIDE 0.9 % IV SOLN: INTRAVENOUS | Status: DC

## 2023-12-20 MED ORDER — HYDRALAZINE HCL 20 MG/ML IJ SOLN: 10.0000 mg | INTRAMUSCULAR | Status: DC | PRN

## 2023-12-20 MED ORDER — FENTANYL CITRATE (PF) 100 MCG/2ML IJ SOLN
INTRAMUSCULAR | Status: DC | PRN
  Administered 2023-12-20: 25 ug via INTRAVENOUS

## 2023-12-20 MED ORDER — MIDAZOLAM HCL 2 MG/2ML IJ SOLN
INTRAMUSCULAR | Status: AC
  Filled 2023-12-20: qty 2

## 2023-12-20 MED ORDER — FENTANYL CITRATE (PF) 100 MCG/2ML IJ SOLN
INTRAMUSCULAR | Status: AC
  Filled 2023-12-20: qty 2

## 2023-12-20 MED ORDER — ASPIRIN 81 MG PO CHEW: 81.0000 mg | CHEWABLE_TABLET | ORAL | Status: DC

## 2023-12-20 MED ORDER — VERAPAMIL HCL 2.5 MG/ML IV SOLN
INTRAVENOUS | Status: DC | PRN
  Administered 2023-12-20: 10 mL via INTRA_ARTERIAL

## 2023-12-20 MED ORDER — NITROGLYCERIN 0.4 MG SL SUBL: 0.4000 mg | SUBLINGUAL_TABLET | SUBLINGUAL | 99 refills | Status: AC | PRN

## 2023-12-20 MED ORDER — HEPARIN SODIUM (PORCINE) 1000 UNIT/ML IJ SOLN
INTRAMUSCULAR | Status: DC | PRN
  Administered 2023-12-20: 4500 [IU] via INTRAVENOUS

## 2023-12-20 MED ORDER — IOHEXOL 350 MG/ML SOLN
INTRAVENOUS | Status: DC | PRN
  Administered 2023-12-20: 40 mL

## 2023-12-20 SURGICAL SUPPLY — 12 items
CATH 5FR JL3.5 JR4 ANG PIG MP (CATHETERS) IMPLANT
CATH BALLN WEDGE 5F 110CM (CATHETERS) IMPLANT
CATH INFINITI 5FR JL4 (CATHETERS) IMPLANT
DEVICE RAD COMP TR BAND LRG (VASCULAR PRODUCTS) IMPLANT
GLIDESHEATH SLEND SS 6F .021 (SHEATH) IMPLANT
GUIDEWIRE .025 260CM (WIRE) IMPLANT
GUIDEWIRE INQWIRE 1.5J.035X260 (WIRE) IMPLANT
KIT SYRINGE INJ CVI SPIKEX1 (MISCELLANEOUS) IMPLANT
PACK CARDIAC CATHETERIZATION (CUSTOM PROCEDURE TRAY) ×1 IMPLANT
SET ATX-X65L (MISCELLANEOUS) IMPLANT
SHEATH GLIDE SLENDER 4/5FR (SHEATH) IMPLANT
SHEATH PROBE COVER 6X72 (BAG) IMPLANT

## 2023-12-20 NOTE — Progress Notes (Signed)
 Patient and daughter was given discharge instructions. Both verbalized understanding.

## 2023-12-20 NOTE — Discharge Instructions (Signed)

## 2023-12-20 NOTE — Brief Op Note (Signed)
 BRIEF CARDIAC CATHETERIZATION NOTE  12/20/2023  9:05 AM  PATIENT:  Francis Sims.  83 y.o. male  PRE-OPERATIVE DIAGNOSIS:  Chest pain and cardiomyopathy  POST-OPERATIVE DIAGNOSIS:  Same  PROCEDURE:  Procedure(s): RIGHT/LEFT HEART CATH AND CORONARY ANGIOGRAPHY (N/A)  SURGEON:  Surgeons and Role:    * Marypat Kimmet, Lonni, MD - Primary  FINDINGS: Severe three-vessel CAD with 99% proximal LAD stenosis with TIMI-1 flow, 99% OM1 lesion with TIMI-1 flow, 70-80% OM2 stenosis, and CTO of codominant proximal RCA. Upper normal left and right heart filling pressure. Reduced Fick cardiac output/index.  RECOMMENDATIONS: Escalate losartan  to 75 mg daily.  Consider transitioning to Entresto  at follow-up. Defer beta-blocker for now with low CO/CI and prior intolerance of metoprolol . Continue clopidogrel  for secondary prevention.  Consider rechallenging with statin versus initiating PCSK9 inhibitor. If patient were to have angina and/or worsening HF, consider viability study and Heart Team discussion of revascularization strategies.  Lonni Hanson, MD [facility]

## 2023-12-20 NOTE — Interval H&P Note (Signed)
 History and Physical Interval Note:  12/20/2023 7:36 AM  Francis Sims.  has presented today for surgery, with the diagnosis of chest pain and cardiomyopathy.  The various methods of treatment have been discussed with the patient and family. After consideration of risks, benefits and other options for treatment, the patient has consented to  Procedure(s): LEFT HEART CATH AND CORONARY ANGIOGRAPHY (N/A) as a surgical intervention.  The patient's history has been reviewed, patient examined, no change in status, stable for surgery.  I have reviewed the patient's chart and labs.  Questions were answered to the patient's satisfaction.    Cath Lab Visit (complete for each Cath Lab visit)  Clinical Evaluation Leading to the Procedure:   ACS: No.  Non-ACS:    Anginal Classification: CCS II  Anti-ischemic medical therapy: Minimal Therapy (1 class of medications)  Non-Invasive Test Results: LVEF 30-35% by echo -> intermediate to high risk  Prior CABG: No previous CABG  Francis Sims

## 2023-12-21 ENCOUNTER — Telehealth: Payer: Self-pay | Admitting: Cardiology

## 2023-12-21 ENCOUNTER — Encounter (HOSPITAL_COMMUNITY): Payer: Self-pay | Admitting: Internal Medicine

## 2023-12-21 MED ORDER — SACUBITRIL-VALSARTAN 24-26 MG PO TABS: 1.0000 | ORAL_TABLET | Freq: Two times a day (BID) | ORAL | 3 refills | Status: AC

## 2023-12-21 NOTE — Telephone Encounter (Signed)
 Patient states his discharge paper shows the dose of losartan  was changed. He states he has never taken losartan . This was first prescribed by PCP if 2022, but patient had never taken this medication.  Patient reports BP at PCP's office are WNL. At PCP on 11/17/23 -- 112/70  Last OV with Dr. [place] 12/16/23 -- 138/77  Advised patient to monitor his BP at home over the next week and report readings to our office on Monday 7/14.   Will forward to Dr. [place] to review and advise on losartan .

## 2023-12-21 NOTE — Telephone Encounter (Signed)
 Patient calling with concerns from his ablation on yesterday. Please advise

## 2023-12-21 NOTE — Telephone Encounter (Signed)
 Patient had heart cath on yesterday, not EP ablation. Forwarding message back to triage pool to address.

## 2023-12-24 NOTE — Telephone Encounter (Signed)
 Already spoke to patient he is taking Entresto  24/26 mg twice a day.Stated he never took Losartan .He will keep appointment with Damien Braver NP 7/12 at 2:45 pm.

## 2023-12-27 ENCOUNTER — Ambulatory Visit: Payer: Self-pay

## 2023-12-27 NOTE — Telephone Encounter (Signed)
 FYI Only or Action Required?: Action required by provider: request for appointment.  Patient was last seen in primary care on 11/17/2023 by Plotnikov, Karlynn GAILS, MD.  Called Nurse Triage reporting Rash.  Symptoms began several days ago.  Interventions attempted: Prescription medications:  SABRA  Symptoms are: gradually worsening. Seen for rash 2 weeks ago, prescription not helping, very painful.  Triage Disposition: See Physician Within 24 Hours  Patient/caregiver understands and will follow disposition?: Yes    Copied from CRM [id number]. Topic: Clinical - Red Word Triage >> Dec 27, 2023  9:43 AM Carlyon D wrote: Red Word that prompted transfer to Nurse Triage:  Severe pain/ rash on his behind. Pt can't sit due to the rash and the severe pain. Reason for Disposition  SEVERE itching (i.e., interferes with sleep, normal activities or school)  Answer Assessment - Initial Assessment Questions 1. APPEARANCE of RASH: What does the rash look like? (e.g., blisters, dry flaky skin, red spots, redness, sores)     unsure 2. SIZE: How big are the spots? (e.g., tip of pen, eraser, coin; inches, centimeters)     unsure 3. LOCATION: Where is the rash located?     Buttocks 4. COLOR: What color is the rash? (Note: It is difficult to assess rash color in people with darker-colored skin. When this situation occurs, simply ask the caller to describe what they see.)     unsure 5. ONSET: When did the rash begin?     N/a 6. FEVER: Do you have a fever? If Yes, ask: What is your temperature, how was it measured, and when did it start?     no 7. ITCHING: Does the rash itch? If Yes, ask: How bad is the itch? (Scale 1-10; or mild, moderate, severe)     no 8. CAUSE: What do you think is causing the rash?     unsure 9. MEDICINE FACTORS: Have you started any new medicines within the last 2 weeks? (e.g., antibiotics)      no 10. OTHER SYMPTOMS: Do you have any other symptoms? (e.g.,  dizziness, headache, sore throat, joint pain)       no 11. PREGNANCY: Is there any chance you are pregnant? When was your last menstrual period?       N/a  Protocols used: Rash or Redness - [facility]

## 2023-12-28 ENCOUNTER — Encounter: Payer: Self-pay | Admitting: Internal Medicine

## 2023-12-28 ENCOUNTER — Ambulatory Visit (INDEPENDENT_AMBULATORY_CARE_PROVIDER_SITE_OTHER): Admitting: Internal Medicine

## 2023-12-28 ENCOUNTER — Other Ambulatory Visit: Payer: Self-pay

## 2023-12-28 VITALS — BP 118/70 | HR 68 | Temp 97.9°F | Ht 74.0 in | Wt 187.0 lb

## 2023-12-28 DIAGNOSIS — I509 Heart failure, unspecified: Secondary | ICD-10-CM | POA: Insufficient documentation

## 2023-12-28 DIAGNOSIS — I2510 Atherosclerotic heart disease of native coronary artery without angina pectoris: Secondary | ICD-10-CM

## 2023-12-28 DIAGNOSIS — I2583 Coronary atherosclerosis due to lipid rich plaque: Secondary | ICD-10-CM

## 2023-12-28 DIAGNOSIS — E7800 Pure hypercholesterolemia, unspecified: Secondary | ICD-10-CM

## 2023-12-28 DIAGNOSIS — L89302 Pressure ulcer of unspecified buttock, stage 2: Secondary | ICD-10-CM | POA: Diagnosis not present

## 2023-12-28 DIAGNOSIS — L8990 Pressure ulcer of unspecified site, unspecified stage: Secondary | ICD-10-CM | POA: Insufficient documentation

## 2023-12-28 MED ORDER — TRIAMCINOLONE ACETONIDE 0.5 % EX OINT: 1.0000 | TOPICAL_OINTMENT | Freq: Three times a day (TID) | CUTANEOUS | 3 refills | Status: AC

## 2023-12-28 MED ORDER — ROSUVASTATIN CALCIUM 10 MG PO TABS: 10.0000 mg | ORAL_TABLET | Freq: Every day | ORAL | 3 refills | Status: DC

## 2023-12-28 MED ORDER — LIDOCAINE 5 % EX OINT: 1.0000 | TOPICAL_OINTMENT | CUTANEOUS | 2 refills | Status: AC | PRN

## 2023-12-28 NOTE — Patient Instructions (Addendum)
 SABRA

## 2023-12-28 NOTE — Assessment & Plan Note (Signed)
 Three-vessel coronary artery disease.  Ischemic cardiomyopathy.  A little better on the Entresto .  Follow-up with cardiology

## 2023-12-28 NOTE — Assessment & Plan Note (Signed)
 New painful rash, ulcers on B buttocks - worse. Lotrisone  did not help... He is sleeps on his back in the recliner.  Ed cannot stand for long time due to weak legs.  He wears depends when out of the house.  In the house he wears regular underwear.  The best seat for him now is his toilet due to painful rash.  Use gel donut cushion Aquaphore for barrier cream Use triamcinolone  0.1% ointment alternating with lidocaine  ointment every 3-4 hours Move more.  Massage areas around the sores Wound clinic consult

## 2023-12-28 NOTE — Assessment & Plan Note (Signed)
 Three-vessel coronary artery disease.  Ischemic cardiomyopathy.  Status post cardiac catheterization.  Follow-up with Cardiology

## 2023-12-28 NOTE — Progress Notes (Signed)
 Subjective:  Patient ID: Francis Sims., male    DOB: 1941-05-13  Age: 83 y.o. MRN: [id number]  CC: Medical Management of Chronic Issues (Painful)   HPI Francis Sims. presents for painful rash, ulcers on B buttocks - worse. Lotrisone  did not help... He is sleeps on his back in the recliner.  Ed cannot stand for long time due to weak legs.  He wears depends when out of the house.  In the house he wears regular underwear.  The best seat for him now is his toilet due to painful rash.  F/u CHF/CAD Is doing little better on Entresto    Outpatient Medications Prior to Visit  Medication Sig Dispense Refill   amLODipine  (NORVASC ) 2.5 MG tablet TAKE 1 TABLET BY MOUTH EVERY DAY 90 tablet 2   cholecalciferol  (VITAMIN D ) 1000 UNITS tablet Take 1,000 Units by mouth daily.     clonazePAM  (KLONOPIN ) 0.5 MG tablet Take 1-2 tablets (0.5-1 mg total) by mouth 3 times/day as needed-between meals & bedtime for anxiety (metallic taste in the mouth). 60 tablet 2   clopidogrel  (PLAVIX ) 75 MG tablet TAKE 1 TABLET BY MOUTH EVERY DAY 90 tablet 3   gabapentin  (NEURONTIN ) 100 MG capsule Take 100 mg by mouth 4 (four) times daily as needed.     gabapentin  (NEURONTIN ) 300 MG capsule Take 1 capsule (300 mg total) by mouth 2 (two) times daily. 180 capsule 1   hydroxypropyl methylcellulose / hypromellose (ISOPTO TEARS / GONIOVISC) 2.5 % ophthalmic solution Place 1 drop into both eyes 3 (three) times daily as needed for dry eyes.     hydrOXYzine  (ATARAX ) 25 MG tablet TAKE 1 TABLET BY MOUTH AT BEDTIME AS NEEDED FOR ITCHING. 90 tablet 1   metFORMIN  (GLUCOPHAGE ) 500 MG tablet TAKE 1 TABLET BY MOUTH EVERY DAY WITH BREAKFAST 90 tablet 3   nitroGLYCERIN  (NITROSTAT ) 0.4 MG SL tablet Place 1 tablet (0.4 mg total) under the tongue every 5 (five) minutes as needed for chest pain. 25 tablet PRN   Omega-3 Fatty Acids (FISH OIL) 1000 MG CAPS Take 1,000 mg by mouth 2 (two) times daily.     oxybutynin  (DITROPAN ) 5 MG tablet TAKE  1 TABLET EVERY 8 HOURS AS NEED FOR BLADDER SPASMS (TO PREVENT URINARY SYMPTOMS WHEN YOU GO OUT) 270 tablet 1   oxyCODONE -acetaminophen  (PERCOCET/ROXICET) 5-325 MG tablet Take 1 tablet by mouth every 4 (four) hours as needed for severe pain (Take 1 h before MRI. Ok to repeat in 4 hrs onc). 2 tablet 0   pantoprazole  (PROTONIX ) 40 MG tablet Take 1 tablet (40 mg total) by mouth daily. 180 tablet 3   sacubitril -valsartan  (ENTRESTO ) 24-26 MG Take 1 tablet by mouth 2 (two) times daily. 180 tablet 3   SENEXON-S 8.6-50 MG tablet TAKE 1 TO 2 TABLETS BY MOUTH DAILY 100 tablet 3   tolterodine  (DETROL  LA) 4 MG 24 hr capsule TAKE 1 CAPSULE BY MOUTH EVERY DAY 30 capsule 5   valACYclovir  (VALTREX ) 500 MG tablet TAKE 1 TABLET BY MOUTH TWICE A DAY FOR CANKER SORES 180 tablet 1   vitamin C  (ASCORBIC ACID ) 250 MG tablet Take 250 mg by mouth daily.     clotrimazole -betamethasone  (LOTRISONE ) cream Apply 1 Application topically daily. 30 g 0   No facility-administered medications prior to visit.    ROS: Review of Systems  Constitutional:  Positive for fatigue. Negative for appetite change and unexpected weight change.  HENT:  Negative for congestion, nosebleeds, sneezing, sore throat and trouble swallowing.  Eyes:  Negative for itching and visual disturbance.  Respiratory:  Positive for shortness of breath. Negative for cough and chest tightness.   Cardiovascular:  Negative for chest pain, palpitations and leg swelling.  Gastrointestinal:  Negative for abdominal distention, blood in stool, diarrhea and nausea.  Genitourinary:  Negative for frequency and hematuria.  Musculoskeletal:  Positive for arthralgias and gait problem. Negative for back pain, joint swelling and neck pain.  Skin:  Positive for color change, rash and wound.  Neurological:  Positive for weakness. Negative for dizziness, tremors and speech difficulty.  Hematological:  Bruises/bleeds easily.  Psychiatric/Behavioral:  Negative for agitation,  dysphoric mood, sleep disturbance and suicidal ideas. The patient is not nervous/anxious.     Objective:  BP 118/70   Pulse 68   Temp 97.9 F (36.6 C) (Oral)   Ht 6' 2 (1.88 m)   Wt 187 lb (84.8 kg)   SpO2 97%   BMI 24.01 kg/m   BP Readings from Last 3 Encounters:  12/28/23 118/70  12/20/23 128/68  12/16/23 138/77    Wt Readings from Last 3 Encounters:  12/28/23 187 lb (84.8 kg)  12/20/23 188 lb (85.3 kg)  12/16/23 189 lb (85.7 kg)    Physical Exam Constitutional:      General: He is not in acute distress.    Appearance: Normal appearance. He is well-developed.     Comments: NAD  Eyes:     Conjunctiva/sclera: Conjunctivae normal.     Pupils: Pupils are equal, round, and reactive to light.  Neck:     Thyroid : No thyromegaly.     Vascular: No JVD.  Cardiovascular:     Rate and Rhythm: Normal rate and regular rhythm.     Heart sounds: Normal heart sounds. No murmur heard.    No friction rub. No gallop.  Pulmonary:     Effort: Pulmonary effort is normal. No respiratory distress.     Breath sounds: Normal breath sounds. No wheezing or rales.  Chest:     Chest wall: No tenderness.  Abdominal:     General: Bowel sounds are normal. There is no distension.     Palpations: Abdomen is soft. There is no mass.     Tenderness: There is no abdominal tenderness. There is no guarding or rebound.  Musculoskeletal:        General: Tenderness present. Normal range of motion.     Cervical back: Normal range of motion.     Right lower leg: No edema.     Left lower leg: No edema.  Lymphadenopathy:     Cervical: No cervical adenopathy.  Skin:    General: Skin is warm and dry.     Findings: Erythema and rash present.  Neurological:     Mental Status: He is alert and oriented to person, place, and time. Mental status is at baseline.     Cranial Nerves: No cranial nerve deficit.     Motor: No abnormal muscle tone.     Coordination: Coordination abnormal.     Gait: Gait  abnormal.     Deep Tendon Reflexes: Reflexes are normal and symmetric.  Psychiatric:        Behavior: Behavior normal.        Thought Content: Thought content normal.        Judgment: Judgment normal.    Rash/ulcers on both buttocks-see picture above Ataxic gait due to the hemiparesis  Lab Results  Component Value Date   WBC 8.7 12/16/2023   HGB 13.9  12/20/2023   HCT 41.0 12/20/2023   PLT 234 12/16/2023   GLUCOSE 98 12/16/2023   CHOL 188 12/16/2023   TRIG 194 (H) 12/16/2023   HDL 36 (L) 12/16/2023   LDLDIRECT 105.0 05/31/2019   LDLCALC 118 (H) 12/16/2023   ALT 17 12/16/2023   AST 22 12/16/2023   NA 131 (L) 12/20/2023   K 3.7 12/20/2023   CL 101 12/16/2023   CREATININE 0.97 12/16/2023   BUN 21 12/16/2023   CO2 24 12/16/2023   TSH 1.11 08/26/2022   PSA 0.30 05/31/2019   INR 1.0 11/09/2019   HGBA1C 6.2 04/27/2023    CARDIAC CATHETERIZATION Result Date: 12/20/2023 Conclusions: Severe three-vessel coronary artery disease, as detailed below, including 99% stenoses of proximal LAD with TIMI-2 flow, high-grade D1, OM1, OM2, and LPL2 disease, and chronic total occlusion of codominant RCA with left-to-right collaterals. Upper normal left and right heart filling pressures (RA 6, PA 39/15, PW 17 mmHg). Reduced Fick cardiac output/index (CO 3.9 L/min, CI 1.8 L/min/m^2).  Recommendations: Escalate losartan  to 75 mg daily.  Consider transitioning to Entresto  at follow-up. Defer beta-blocker for now with low CO/CI and prior intolerance of metoprolol . Continue clopidogrel  for secondary prevention.  Consider rechallenging with statin versus initiating PCSK9 inhibitor. If patient were to have angina and/or worsening HF, consider viability study and Heart Team discussion of revascularization strategies. Lonni Hanson, MD [facility]   Assessment & Plan:   Problem List Items Addressed This Visit     CAD (coronary artery disease)   Three-vessel coronary artery disease.  Ischemic  cardiomyopathy.  Status post cardiac catheterization.  Follow-up with Cardiology      Decubitus ulcers - Primary   New painful rash, ulcers on B buttocks - worse. Lotrisone  did not help... He is sleeps on his back in the recliner.  Ed cannot stand for long time due to weak legs.  He wears depends when out of the house.  In the house he wears regular underwear.  The best seat for him now is his toilet due to painful rash.  Use gel donut cushion Aquaphore for barrier cream Use triamcinolone  0.1% ointment alternating with lidocaine  ointment every 3-4 hours Move more.  Massage areas around the sores Wound clinic consult      Relevant Orders   Ambulatory referral to Wound Clinic   CHF (congestive heart failure) (HCC)   Three-vessel coronary artery disease.  Ischemic cardiomyopathy.  A little better on the Entresto .  Follow-up with cardiology         Meds ordered this encounter  Medications   triamcinolone  ointment (KENALOG ) 0.5 %    Sig: Apply 1 Application topically 3 (three) times daily.    Dispense:  120 g    Refill:  3   lidocaine  (XYLOCAINE ) 5 % ointment    Sig: Apply 1 Application topically as needed. Alternate w/triamcinolone  oint    Dispense:  150 g    Refill:  2      Follow-up: No follow-ups on file.  Marolyn Noel, MD

## 2024-01-03 ENCOUNTER — Encounter: Payer: Self-pay | Admitting: Nurse Practitioner

## 2024-01-03 ENCOUNTER — Ambulatory Visit: Attending: Nurse Practitioner | Admitting: Nurse Practitioner

## 2024-01-03 VITALS — BP 112/60 | HR 76 | Ht 74.0 in | Wt 188.0 lb

## 2024-01-03 DIAGNOSIS — Z8673 Personal history of transient ischemic attack (TIA), and cerebral infarction without residual deficits: Secondary | ICD-10-CM

## 2024-01-03 DIAGNOSIS — I255 Ischemic cardiomyopathy: Secondary | ICD-10-CM | POA: Diagnosis not present

## 2024-01-03 DIAGNOSIS — E785 Hyperlipidemia, unspecified: Secondary | ICD-10-CM

## 2024-01-03 DIAGNOSIS — I2510 Atherosclerotic heart disease of native coronary artery without angina pectoris: Secondary | ICD-10-CM

## 2024-01-03 DIAGNOSIS — I10 Essential (primary) hypertension: Secondary | ICD-10-CM | POA: Diagnosis not present

## 2024-01-03 NOTE — Progress Notes (Unsigned)
 Office Visit    Patient Name: Francis Sims. Date of Encounter: 01/03/2024  Primary Care Provider:  Garald Karlynn GAILS, MD Primary Cardiologist:  Peter Swaziland, MD  Chief Complaint    83 year old male with a history of CAD, ICM, CVA, hypertension, and hyperlipidemia who presents for follow-up related to CAD and cardiomyopathy.  Past Medical History    Past Medical History:  Diagnosis Date   Burping    per pt lots of burping   Chronic back pain    pinched nerve;buldging disc   Dry skin    on elbows;uses vasaline and it goes away   GERD (gastroesophageal reflux disease)    OTC   Hemorrhoids    Hx of colonic polyps    69yrs ago   Hypertension    Nocturia    PONV (postoperative nausea and vomiting)    Stroke (HCC) 10/2019   Past Surgical History:  Procedure Laterality Date   ALVEOLOPLASTY Left 02/14/2021   Procedure: EXCISION OF LEFT MANDIBULAR ALVEOLAR CANCER; INCLUDING SECOND MOLAR;  Surgeon: Jesus Oliphant, MD;  Location: [facility] OR;  Service: ENT;  Laterality: Left;   BACK SURGERY     BROW LIFT Right 07/12/2013   Procedure: CONTRACTURE  RELEASE ZPLASTY OF RIGHT EYE, PERIORBITAL AREA WITH REPAIR OF PTOSIS OF RIGHT EYE BROW;  Surgeon: Estefana Reichert, DO;  Location:  SURGERY CENTER;  Service: Plastics;  Laterality: Right;   CATARACT EXTRACTION  2008   bilateral   CERVICAL FUSION  2000   C5,6,7   COLONOSCOPY     EYE SURGERY     HERNIA REPAIR  1949   LUMBAR LAMINECTOMY/DECOMPRESSION MICRODISCECTOMY  05/26/2011   Procedure: LUMBAR LAMINECTOMY/DECOMPRESSION MICRODISCECTOMY;  Surgeon: Darina KIDD Kritzer;  Location: [facility] NEURO ORS;  Service: Neurosurgery;  Laterality: Right;  Right Lumbar three-four Microdiskectomy   RIGHT/LEFT HEART CATH AND CORONARY ANGIOGRAPHY N/A 12/20/2023   Procedure: RIGHT/LEFT HEART CATH AND CORONARY ANGIOGRAPHY;  Surgeon: Mady Bruckner, MD;  Location: [facility] INVASIVE CV LAB;  Service: Cardiovascular;  Laterality: N/A;   TONSILLECTOMY       Allergies  Allergies  Allergen Reactions   Penicillins Hives and Swelling   Sulfonamide Derivatives Hives    Almost stopped heart in the service..Also, causes hives   Metoprolol      fatigue   Hydroxyzine      Too strong   Ibuprofen  Itching    Pt states he can take ibuprofen    Statins     aches     Labs/Other Studies Reviewed    The following studies were reviewed today:  Cardiac Studies & Procedures   ______________________________________________________________________________________________ CARDIAC CATHETERIZATION  CARDIAC CATHETERIZATION 12/20/2023  Conclusion Conclusions: Severe three-vessel coronary artery disease, as detailed below, including 99% stenoses of proximal LAD with TIMI-2 flow, high-grade D1, OM1, OM2, and LPL2 disease, and chronic total occlusion of codominant RCA with left-to-right collaterals. Upper normal left and right heart filling pressures (RA 6, PA 39/15, PW 17 mmHg). Reduced Fick cardiac output/index (CO 3.9 L/min, CI 1.8 L/min/m^2).  Recommendations: Escalate losartan  to 75 mg daily.  Consider transitioning to Entresto  at follow-up. Defer beta-blocker for now with low CO/CI and prior intolerance of metoprolol . Continue clopidogrel  for secondary prevention.  Consider rechallenging with statin versus initiating PCSK9 inhibitor. If patient were to have angina and/or worsening HF, consider viability study and Heart Team discussion of revascularization strategies.  Bruckner Mady, MD [facility]  Findings Coronary Findings Diagnostic  Dominance: Co-dominant  Left Main Vessel is large. Mid LM to Dist LM  lesion is 15% stenosed.  Left Anterior Descending Ost LAD to Prox LAD lesion is 99% stenosed. Prox LAD to Mid LAD lesion is 50% stenosed.  First Diagonal Branch Vessel is moderate in size. 1st Diag-1 lesion is 90% stenosed. 1st Diag-2 lesion is 90% stenosed.  Left Circumflex Vessel is large. Mid Cx lesion is 40%  stenosed.  First Obtuse Marginal Branch Vessel is moderate in size. 1st Mrg lesion is 90% stenosed.  Second Obtuse Marginal Branch Vessel is small in size. 2nd Mrg lesion is 99% stenosed.  First Left Posterolateral Branch Vessel is small in size.  Second Left Posterolateral Branch Vessel is large in size. 2nd LPL lesion is 70% stenosed.  Third Left Posterolateral Branch Vessel is small in size.  Right Coronary Artery Vessel is small. Prox RCA to Mid RCA lesion is 100% stenosed. The lesion is chronically occluded with left-to-right collateral flow.  Intervention  No interventions have been documented.     ECHOCARDIOGRAM  ECHOCARDIOGRAM COMPLETE 12/16/2023  Narrative ECHOCARDIOGRAM REPORT    Patient Name:   Francis Sims. Date of Exam: 12/16/2023 Medical Rec #:  [id number]           Height:       74.0 in Accession #:    [id number]          Weight:       188.0 lb Date of Birth:  12/22/1940           BSA:          2.117 m Patient Age:    82 years            BP:           112/70 mmHg Patient Gender: M                   HR:           64 bpm. Exam Location:  [address]  Procedure: 2D Echo, Cardiac Doppler, Color Doppler and Intracardiac Opacification Agent (Both Spectral and Color Flow Doppler were utilized during procedure).  Indications:    R94.31 Abnormal Ekg R07.9 Chest pain  History:        Patient has prior history of Echocardiogram examinations, most recent 11/10/2019. Signs/Symptoms:Murmur, Chest Pain and Chest pressure with exertion; Risk Factors:Hypertension.  Sonographer:    Augustin Seals RDCS Referring Phys: [id number] KARLYNN GAILS PLOTNIKOV   Sonographer Comments: Contacted DOD regarding echo imaging. Stated patient to be seen upstairs if possible. IMPRESSIONS   1. No apical mural thrombus with Definity  contrast. Color doppler jet noted in the basal membranous septum, not seen in all views, but cannot r/o small perimembranous VSD. Left ventricular  ejection fraction, by estimation, is 35 to 40%. Left ventricular ejection fraction by PLAX is 37 %. The left ventricle has moderately decreased function. The left ventricle demonstrates regional wall motion abnormalities (see scoring diagram/findings for description). There is mild left ventricular hypertrophy. Left ventricular diastolic parameters are consistent with Grade I diastolic dysfunction (impaired relaxation). There is severe hypokinesis of the left ventricular, mid-apical anteroseptal wall, anterior wall, apical segment, inferoapical segment and inferior segment. 2. Right ventricular systolic function is hyperdynamic. The right ventricular size is normal. There is normal pulmonary artery systolic pressure. The estimated right ventricular systolic pressure is 32.0 mmHg. 3. The mitral valve is abnormal. Trivial mitral valve regurgitation. 4. The aortic valve is tricuspid. Aortic valve regurgitation is trivial. Aortic valve sclerosis/calcification is present, without any evidence of aortic stenosis. 5. The  inferior vena cava is normal in size with <50% respiratory variability, suggesting right atrial pressure of 8 mmHg.  Comparison(s): Changes from prior study are noted. 11/10/2019: LVEF 60-65%, normal wall motion.  Conclusion(s)/Recommendation(s): Findings concerning for LAD territory ischemia/infarct. Would advise patient been seen as soon as possible. Critical findings reported to Cardiology Doctor of the day (DOD) and acknowledged.  FINDINGS Left Ventricle: No apical mural thrombus with Definity  contrast. Color doppler jet noted in the basal membranous septum, not seen in all views, but cannot r/o small perimembranous VSD. Left ventricular ejection fraction, by estimation, is 35 to 40%. Left ventricular ejection fraction by PLAX is 37 %. The left ventricle has moderately decreased function. The left ventricle demonstrates regional wall motion abnormalities. Severe hypokinesis of the left  ventricular, mid-apical anteroseptal wall, anterior wall, apical segment, inferoapical segment and inferior segment. Definity  contrast agent was given IV to delineate the left ventricular endocardial borders. The left ventricular internal cavity size was normal in size. There is mild left ventricular hypertrophy. Left ventricular diastolic parameters are consistent with Grade I diastolic dysfunction (impaired relaxation). Indeterminate filling pressures.   LV Wall Scoring: The mid and distal anterior septum, entire apex, mid and distal inferior wall, and mid inferoseptal segment are hypokinetic.  Right Ventricle: The right ventricular size is normal. No increase in right ventricular wall thickness. Right ventricular systolic function is hyperdynamic. There is normal pulmonary artery systolic pressure. The tricuspid regurgitant velocity is 2.45 m/s, and with an assumed right atrial pressure of 8 mmHg, the estimated right ventricular systolic pressure is 32.0 mmHg.  Left Atrium: Left atrial size was normal in size.  Right Atrium: Right atrial size was normal in size.  Pericardium: There is no evidence of pericardial effusion.  Mitral Valve: The mitral valve is abnormal. Mild mitral annular calcification. Trivial mitral valve regurgitation.  Tricuspid Valve: The tricuspid valve is grossly normal. Tricuspid valve regurgitation is mild.  Aortic Valve: The aortic valve is tricuspid. Aortic valve regurgitation is trivial. Aortic valve sclerosis/calcification is present, without any evidence of aortic stenosis.  Pulmonic Valve: The pulmonic valve was grossly normal. Pulmonic valve regurgitation is trivial.  Aorta: The aortic root and ascending aorta are structurally normal, with no evidence of dilitation.  Venous: The inferior vena cava is normal in size with less than 50% respiratory variability, suggesting right atrial pressure of 8 mmHg.  IAS/Shunts: No atrial level shunt detected by color  flow Doppler.   LEFT VENTRICLE PLAX 2D LV EF:         Left            Diastology ventricular     LV e' medial:    4.87 cm/s ejection        LV E/e' medial:  8.2 fraction by     LV e' lateral:   6.57 cm/s PLAX is 37      LV E/e' lateral: 6.1 %. LVIDd:         5.50 cm LVIDs:         4.50 cm LV PW:         1.20 cm LV IVS:        1.40 cm LVOT diam:     2.00 cm LV SV:         61 LV SV Index:   29 LVOT Area:     3.14 cm   RIGHT VENTRICLE             IVC RV Basal diam:  4.20 cm  IVC diam: 2.00 cm RV Mid diam:    3.10 cm RV S prime:     15.40 cm/s TAPSE (M-mode): 2.8 cm RVSP:           27.0 mmHg  LEFT ATRIUM             Index        RIGHT ATRIUM           Index LA diam:        3.30 cm 1.56 cm/m   RA Pressure: 3.00 mmHg LA Vol (A2C):   42.2 ml 19.93 ml/m  RA Area:     16.20 cm LA Vol (A4C):   34.6 ml 16.34 ml/m  RA Volume:   41.90 ml  19.79 ml/m LA Biplane Vol: 38.4 ml 18.14 ml/m AORTIC VALVE LVOT Vmax:   98.50 cm/s LVOT Vmean:  61.800 cm/s LVOT VTI:    0.195 m  AORTA Ao Root diam: 3.40 cm Ao Asc diam:  3.60 cm  MITRAL VALVE               TRICUSPID VALVE MV Area (PHT): 2.87 cm    TR Peak grad:   24.0 mmHg MV Decel Time: 264 msec    TR Vmax:        245.00 cm/s MV E velocity: 39.80 cm/s  Estimated RAP:  3.00 mmHg MV A velocity: 74.90 cm/s  RVSP:           27.0 mmHg MV E/A ratio:  0.53 SHUNTS Systemic VTI:  0.20 m Systemic Diam: 2.00 cm  Vinie Maxcy MD Electronically signed by Vinie Maxcy MD Signature Date/Time: 12/16/2023/2:19:49 PM    Final      CT SCANS  CT CARDIAC SCORING (SELF PAY ONLY) 06/02/2018  Addendum 06/02/2018 10:51 AM ADDENDUM REPORT: 06/02/2018 10:49  CLINICAL DATA:  Risk stratification  EXAM: Coronary Calcium  Score  TECHNIQUE: The patient was scanned on a [employer] scanner. Axial non-contrast 3 mm slices were carried out through the heart. The data set was analyzed on a dedicated work station and scored using the  Agatson method.  FINDINGS: Non-cardiac: See separate report from [facility].  Ascending Aorta: Normal size, trivial diffuse calcifications.  Pericardium: Normal.  Coronary arteries: Normal origin.  IMPRESSION: Coronary calcium  score of 216. This was 16 percentile for age and sex matched control.   Electronically Signed By: Leim Moose On: 06/02/2018 10:49  Narrative EXAM: OVER-READ INTERPRETATION  CT CHEST  The following report is an over-read performed by radiologist Dr. Franky Crease of [facility] on 06/02/2018. This over-read does not include interpretation of cardiac or coronary anatomy or pathology. The coronary calcium  score interpretation by the cardiologist is attached.  COMPARISON:  None.  FINDINGS: Vascular: Heart is normal size.  Visualized aorta normal caliber.  Mediastinum/Nodes: No adenopathy in the lower mediastinum or hila.  Lungs/Pleura: No confluent opacities or effusions. Lingular scarring.  Upper Abdomen: Imaging into the upper abdomen shows no acute findings.  Musculoskeletal: Chest wall soft tissues are unremarkable. No acute bony abnormality.  IMPRESSION: No acute or significant extracardiac abnormality.  Electronically Signed: By: Franky Crease M.D. On: 06/02/2018 10:21     ______________________________________________________________________________________________     Recent Labs: 12/16/2023: ALT 17; BUN 21; Creatinine, Ser 0.97; Platelets 234 12/20/2023: Hemoglobin 13.9; Potassium 3.7; Sodium 131  Recent Lipid Panel    Component Value Date/Time   CHOL 188 12/16/2023 1548   TRIG 194 (H) 12/16/2023 1548   HDL 36 (L) 12/16/2023 1548  CHOLHDL 5.2 (H) 12/16/2023 1548   CHOLHDL 5.4 11/10/2019 0644   VLDL 16 11/10/2019 0644   LDLCALC 118 (H) 12/16/2023 1548   LDLDIRECT 105.0 05/31/2019 1148    History of Present Illness    83 year old male with the above past medical history including CAD, ICM, CVA,  hypertension, and hyperlipidemia.  He was referred to cardiology in the setting of chest pain, newly reduced EF.  Echocardiogram on 12/16/2023 showed EF 35 to 40%, LV RWMA, mild LVH, G1 DD, hyperdynamic RV systolic function, no significant valvular abnormalities.  He was last seen in the office on 12/16/2023 by Dr. Swaziland.  He was started on Crestor .  Given concern for possible prior MI, newly reduced EF, he underwent outpatient cardiac catheterization on 12/20/2023, which revealed severe three-vessel CAD including 99% stenosis of the proximal LAD, high-grade D1, OM1, OM 2, and LPL2 disease, CTO of codominant RCA with left-to-right collaterals, upper normal left and right heart filling pressures, reduced Fick cardiac output index. Beta-blocker was deferred in the setting of low CO/CI, prior intolerance of metoprolol .  He was started on Entresto .  It was noted that should he have symptoms concerning for angina, viability study, and heart team discussion for revascularization strategy should be considered.  He presents today for follow-up.  Since his last visit He has been stable overall from a cardiac standpoint.  He denies symptoms concerning for angina.  Euvolemic and well compensated on exam.  He is tolerating his current medications.  BP borderline low, do not think he will tolerate further escalation of GDMT.  Will check BMET today.  He will have fasting lipids, LFTs in 6 to 8 weeks.  Follow-up in 3 to 4 months with Dr. Swaziland.  1. CAD: Cardiac catheterization on 12/20/2023 revealed severe three-vessel CAD including 99% stenosis of the proximal LAD, high-grade D1, OM1, OM 2, and LPL2 disease, CTO of codominant RCA with left-to-right collaterals, upper normal left and right heart filling pressures, reduced Fick cardiac output index.  Continue Plavix , amlodipine , Entresto , Crestor . 2. ICM:  Echo on 12/16/2023 showed EF 35 to 40%, LV RWMA, mild LVH, G1 DD, hyperdynamic RV systolic function, no significant valvular  abnormalities.  Recent cath as above. Euvolemic and well compensated on exam.  Continue Entresto . 3. History of CVA: Residual left lower extremity weakness.  Continue Crestor .   4. Hypertension: BP well controlled. Continue current antihypertensive regimen.  5. Hyperlipidemia: LDL was 119 on 12/16/2023.Pending repeat fasting lipids, LFTs.  Continue Crestor . 6. Disposition: Follow-up in      Home Medications    Current Outpatient Medications  Medication Sig Dispense Refill   amLODipine  (NORVASC ) 2.5 MG tablet TAKE 1 TABLET BY MOUTH EVERY DAY 90 tablet 2   cholecalciferol  (VITAMIN D ) 1000 UNITS tablet Take 1,000 Units by mouth daily.     clonazePAM  (KLONOPIN ) 0.5 MG tablet Take 1-2 tablets (0.5-1 mg total) by mouth 3 times/day as needed-between meals & bedtime for anxiety (metallic taste in the mouth). 60 tablet 2   clopidogrel  (PLAVIX ) 75 MG tablet TAKE 1 TABLET BY MOUTH EVERY DAY 90 tablet 3   gabapentin  (NEURONTIN ) 100 MG capsule Take 100 mg by mouth 4 (four) times daily as needed.     gabapentin  (NEURONTIN ) 300 MG capsule Take 1 capsule (300 mg total) by mouth 2 (two) times daily. 180 capsule 1   hydroxypropyl methylcellulose / hypromellose (ISOPTO TEARS / GONIOVISC) 2.5 % ophthalmic solution Place 1 drop into both eyes 3 (three) times daily as needed for dry eyes.  hydrOXYzine  (ATARAX ) 25 MG tablet TAKE 1 TABLET BY MOUTH AT BEDTIME AS NEEDED FOR ITCHING. 90 tablet 1   lidocaine  (XYLOCAINE ) 5 % ointment Apply 1 Application topically as needed. Alternate w/triamcinolone  oint 150 g 2   metFORMIN  (GLUCOPHAGE ) 500 MG tablet TAKE 1 TABLET BY MOUTH EVERY DAY WITH BREAKFAST 90 tablet 3   nitroGLYCERIN  (NITROSTAT ) 0.4 MG SL tablet Place 1 tablet (0.4 mg total) under the tongue every 5 (five) minutes as needed for chest pain. 25 tablet PRN   Omega-3 Fatty Acids (FISH OIL) 1000 MG CAPS Take 1,000 mg by mouth 2 (two) times daily.     oxybutynin  (DITROPAN ) 5 MG tablet TAKE 1 TABLET EVERY 8 HOURS AS  NEED FOR BLADDER SPASMS (TO PREVENT URINARY SYMPTOMS WHEN YOU GO OUT) 270 tablet 1   oxyCODONE -acetaminophen  (PERCOCET/ROXICET) 5-325 MG tablet Take 1 tablet by mouth every 4 (four) hours as needed for severe pain (Take 1 h before MRI. Ok to repeat in 4 hrs onc). 2 tablet 0   pantoprazole  (PROTONIX ) 40 MG tablet Take 1 tablet (40 mg total) by mouth daily. 180 tablet 3   rosuvastatin  (CRESTOR ) 10 MG tablet Take 1 tablet (10 mg total) by mouth daily. 90 tablet 3   sacubitril -valsartan  (ENTRESTO ) 24-26 MG Take 1 tablet by mouth 2 (two) times daily. 180 tablet 3   SENEXON-S 8.6-50 MG tablet TAKE 1 TO 2 TABLETS BY MOUTH DAILY 100 tablet 3   tolterodine  (DETROL  LA) 4 MG 24 hr capsule TAKE 1 CAPSULE BY MOUTH EVERY DAY 30 capsule 5   triamcinolone  ointment (KENALOG ) 0.5 % Apply 1 Application topically 3 (three) times daily. 120 g 3   valACYclovir  (VALTREX ) 500 MG tablet TAKE 1 TABLET BY MOUTH TWICE A DAY FOR CANKER SORES 180 tablet 1   vitamin C  (ASCORBIC ACID ) 250 MG tablet Take 250 mg by mouth daily.     No current facility-administered medications for this visit.     Review of Systems    ***.  All other systems reviewed and are otherwise negative except as noted above.    Physical Exam    VS:  BP 112/60 (BP Location: Right Arm, Patient Position: Sitting)   Pulse 76   Ht 6' 2 (1.88 m)   Wt 188 lb (85.3 kg)   SpO2 94%   BMI 24.14 kg/m  GEN: Well nourished, well developed, in no acute distress. HEENT: normal. Neck: Supple, no JVD, carotid bruits, or masses. Cardiac: RRR, no murmurs, rubs, or gallops. No clubbing, cyanosis, edema.  Radials/DP/PT 2+ and equal bilaterally.  Respiratory:  Respirations regular and unlabored, clear to auscultation bilaterally. GI: Soft, nontender, nondistended, BS + x 4. MS: no deformity or atrophy. Skin: warm and dry, no rash. Neuro:  Strength and sensation are intact. Psych: Normal affect.  Accessory Clinical Findings    ECG personally reviewed by me  today - EKG Interpretation Date/Time:  Monday January 03 2024 15:01:49 EDT Ventricular Rate:  78 PR Interval:  168 QRS Duration:  94 QT Interval:  400 QTC Calculation: 456 R Axis:   -62  Text Interpretation: Normal sinus rhythm Left anterior fascicular block Left ventricular hypertrophy with repolarization abnormality ( R in aVL , Cornell product , Romhilt-Estes ) Cannot rule out Septal infarct (cited on or before 16-Dec-2023) When compared with ECG of 16-Dec-2023 14:33, Questionable change in initial forces of Septal leads Confirmed by Daneen Perkins ([id number]) on 01/03/2024 3:12:35 PM  - no acute changes.   Lab Results  Component Value Date  WBC 8.7 12/16/2023   HGB 13.9 12/20/2023   HCT 41.0 12/20/2023   MCV 87 12/16/2023   PLT 234 12/16/2023   Lab Results  Component Value Date   CREATININE 0.97 12/16/2023   BUN 21 12/16/2023   NA 131 (L) 12/20/2023   K 3.7 12/20/2023   CL 101 12/16/2023   CO2 24 12/16/2023   Lab Results  Component Value Date   ALT 17 12/16/2023   AST 22 12/16/2023   ALKPHOS 104 12/16/2023   BILITOT 0.9 12/16/2023   Lab Results  Component Value Date   CHOL 188 12/16/2023   HDL 36 (L) 12/16/2023   LDLCALC 118 (H) 12/16/2023   LDLDIRECT 105.0 05/31/2019   TRIG 194 (H) 12/16/2023   CHOLHDL 5.2 (H) 12/16/2023    Lab Results  Component Value Date   HGBA1C 6.2 04/27/2023    Assessment & Plan    1.  ***      Damien JAYSON Braver, NP 01/03/2024, 3:21 PM

## 2024-01-03 NOTE — Patient Instructions (Signed)
 Medication Instructions:  Your physician recommends that you continue on your current medications as directed. Please refer to the Current Medication list given to you today.  *If you need a refill on your cardiac medications before your next appointment, please call your pharmacy*  Lab Work: BMET today. Please complete Lipid and LFTs previously ordered in 6-8 weeks.  Testing/Procedures: NONE ordered at this time of appointment    Follow-Up: At [facility], you and your health needs are our priority.  As part of our continuing mission to provide you with exceptional heart care, our providers are all part of one team.  This team includes your primary Cardiologist (physician) and Advanced Practice Providers or APPs (Physician Assistants and Nurse Practitioners) who all work together to provide you with the care you need, when you need it.  Your next appointment:   3-4 month(s)  Provider:   Peter Swaziland, MD    We recommend signing up for the patient portal called MyChart.  Sign up information is provided on this After Visit Summary.  MyChart is used to connect with patients for Virtual Visits (Telemedicine).  Patients are able to view lab/test results, encounter notes, upcoming appointments, etc.  Non-urgent messages can be sent to your provider as well.   To learn more about what you can do with MyChart, go to [email or web address].   Other Instructions

## 2024-01-04 ENCOUNTER — Encounter: Payer: Self-pay | Admitting: Nurse Practitioner

## 2024-01-04 ENCOUNTER — Other Ambulatory Visit (HOSPITAL_COMMUNITY): Payer: Self-pay

## 2024-01-04 ENCOUNTER — Telehealth: Payer: Self-pay

## 2024-01-04 LAB — BASIC METABOLIC PANEL WITH GFR
BUN/Creatinine Ratio: 20 (ref 10–24)
BUN: 27 mg/dL (ref 8–27)
CO2: 22 mmol/L (ref 20–29)
Calcium: 9.8 mg/dL (ref 8.6–10.2)
Chloride: 100 mmol/L (ref 96–106)
Creatinine, Ser: 1.34 mg/dL — ABNORMAL HIGH (ref 0.76–1.27)
Glucose: 116 mg/dL — ABNORMAL HIGH (ref 70–99)
Potassium: 4.6 mmol/L (ref 3.5–5.2)
Sodium: 138 mmol/L (ref 134–144)
eGFR: 54 mL/min/1.73 — ABNORMAL LOW (ref >59)

## 2024-01-04 NOTE — Telephone Encounter (Signed)
 Pharmacy Patient Advocate Encounter  Received notification from [employer] that Prior Authorization for Lidocaine  5% ointment  has been APPROVED from 01/04/24 to 04/03/24. Ran test claim, Copay is $27.69. This test claim was processed through [facility]- copay amounts may vary at other pharmacies due to pharmacy/plan contracts, or as the patient moves through the different stages of their insurance plan.   PA #/Case ID/Reference #: [id number]

## 2024-01-05 ENCOUNTER — Ambulatory Visit: Payer: Self-pay | Admitting: Nurse Practitioner

## 2024-01-05 ENCOUNTER — Encounter (HOSPITAL_BASED_OUTPATIENT_CLINIC_OR_DEPARTMENT_OTHER): Attending: General Surgery | Admitting: General Surgery

## 2024-01-05 DIAGNOSIS — G629 Polyneuropathy, unspecified: Secondary | ICD-10-CM | POA: Insufficient documentation

## 2024-01-05 DIAGNOSIS — I2510 Atherosclerotic heart disease of native coronary artery without angina pectoris: Secondary | ICD-10-CM | POA: Diagnosis not present

## 2024-01-05 DIAGNOSIS — I69354 Hemiplegia and hemiparesis following cerebral infarction affecting left non-dominant side: Secondary | ICD-10-CM | POA: Diagnosis not present

## 2024-01-05 DIAGNOSIS — L89313 Pressure ulcer of right buttock, stage 3: Secondary | ICD-10-CM | POA: Insufficient documentation

## 2024-01-05 DIAGNOSIS — L89323 Pressure ulcer of left buttock, stage 3: Secondary | ICD-10-CM | POA: Diagnosis not present

## 2024-01-07 ENCOUNTER — Other Ambulatory Visit: Payer: Self-pay

## 2024-01-07 DIAGNOSIS — N289 Disorder of kidney and ureter, unspecified: Secondary | ICD-10-CM

## 2024-01-07 DIAGNOSIS — Z79899 Other long term (current) drug therapy: Secondary | ICD-10-CM

## 2024-01-18 ENCOUNTER — Ambulatory Visit (INDEPENDENT_AMBULATORY_CARE_PROVIDER_SITE_OTHER): Admitting: Internal Medicine

## 2024-01-18 ENCOUNTER — Encounter: Payer: Self-pay | Admitting: Internal Medicine

## 2024-01-18 VITALS — BP 131/69 | HR 85 | Temp 97.5°F | Ht 74.0 in | Wt 183.0 lb

## 2024-01-18 DIAGNOSIS — E785 Hyperlipidemia, unspecified: Secondary | ICD-10-CM | POA: Diagnosis not present

## 2024-01-18 DIAGNOSIS — I2510 Atherosclerotic heart disease of native coronary artery without angina pectoris: Secondary | ICD-10-CM | POA: Diagnosis not present

## 2024-01-18 DIAGNOSIS — Z8673 Personal history of transient ischemic attack (TIA), and cerebral infarction without residual deficits: Secondary | ICD-10-CM

## 2024-01-18 DIAGNOSIS — I69354 Hemiplegia and hemiparesis following cerebral infarction affecting left non-dominant side: Secondary | ICD-10-CM | POA: Diagnosis not present

## 2024-01-18 DIAGNOSIS — R5383 Other fatigue: Secondary | ICD-10-CM

## 2024-01-18 DIAGNOSIS — L98492 Non-pressure chronic ulcer of skin of other sites with fat layer exposed: Secondary | ICD-10-CM | POA: Diagnosis not present

## 2024-01-18 DIAGNOSIS — I2583 Coronary atherosclerosis due to lipid rich plaque: Secondary | ICD-10-CM | POA: Diagnosis not present

## 2024-01-18 DIAGNOSIS — L98499 Non-pressure chronic ulcer of skin of other sites with unspecified severity: Secondary | ICD-10-CM | POA: Insufficient documentation

## 2024-01-18 NOTE — Progress Notes (Signed)
 Subjective:  Patient ID: Francis CHRISTELLA Chinita Mickey., male    DOB: 05-Oct-1940  Age: 83 y.o. MRN: [id number]  CC: Medical Management of Chronic Issues (2 MNTH F/U )   HPI Francis CHRISTELLA Chinita Mickey. presents for skin ulcers - better, CAD, h/o CVA C/o fatigue due to Crestor  per pt  Outpatient Medications Prior to Visit  Medication Sig Dispense Refill   amLODipine  (NORVASC ) 2.5 MG tablet TAKE 1 TABLET BY MOUTH EVERY DAY 90 tablet 2   cholecalciferol  (VITAMIN D ) 1000 UNITS tablet Take 1,000 Units by mouth daily.     clonazePAM  (KLONOPIN ) 0.5 MG tablet Take 1-2 tablets (0.5-1 mg total) by mouth 3 times/day as needed-between meals & bedtime for anxiety (metallic taste in the mouth). 60 tablet 2   clopidogrel  (PLAVIX ) 75 MG tablet TAKE 1 TABLET BY MOUTH EVERY DAY 90 tablet 3   gabapentin  (NEURONTIN ) 100 MG capsule Take 100 mg by mouth 4 (four) times daily as needed.     gabapentin  (NEURONTIN ) 300 MG capsule Take 1 capsule (300 mg total) by mouth 2 (two) times daily. 180 capsule 1   hydroxypropyl methylcellulose / hypromellose (ISOPTO TEARS / GONIOVISC) 2.5 % ophthalmic solution Place 1 drop into both eyes 3 (three) times daily as needed for dry eyes.     hydrOXYzine  (ATARAX ) 25 MG tablet TAKE 1 TABLET BY MOUTH AT BEDTIME AS NEEDED FOR ITCHING. 90 tablet 1   lidocaine  (XYLOCAINE ) 5 % ointment Apply 1 Application topically as needed. Alternate w/triamcinolone  oint 150 g 2   metFORMIN  (GLUCOPHAGE ) 500 MG tablet TAKE 1 TABLET BY MOUTH EVERY DAY WITH BREAKFAST 90 tablet 3   nitroGLYCERIN  (NITROSTAT ) 0.4 MG SL tablet Place 1 tablet (0.4 mg total) under the tongue every 5 (five) minutes as needed for chest pain. 25 tablet PRN   Omega-3 Fatty Acids (FISH OIL) 1000 MG CAPS Take 1,000 mg by mouth 2 (two) times daily.     oxybutynin  (DITROPAN ) 5 MG tablet TAKE 1 TABLET EVERY 8 HOURS AS NEED FOR BLADDER SPASMS (TO PREVENT URINARY SYMPTOMS WHEN YOU GO OUT) 270 tablet 1   oxyCODONE -acetaminophen  (PERCOCET/ROXICET) 5-325 MG  tablet Take 1 tablet by mouth every 4 (four) hours as needed for severe pain (Take 1 h before MRI. Ok to repeat in 4 hrs onc). 2 tablet 0   pantoprazole  (PROTONIX ) 40 MG tablet Take 1 tablet (40 mg total) by mouth daily. 180 tablet 3   rosuvastatin  (CRESTOR ) 10 MG tablet Take 1 tablet (10 mg total) by mouth daily. 90 tablet 3   sacubitril -valsartan  (ENTRESTO ) 24-26 MG Take 1 tablet by mouth 2 (two) times daily. 180 tablet 3   SENEXON-S 8.6-50 MG tablet TAKE 1 TO 2 TABLETS BY MOUTH DAILY 100 tablet 3   tolterodine  (DETROL  LA) 4 MG 24 hr capsule TAKE 1 CAPSULE BY MOUTH EVERY DAY 30 capsule 5   triamcinolone  ointment (KENALOG ) 0.5 % Apply 1 Application topically 3 (three) times daily. 120 g 3   valACYclovir  (VALTREX ) 500 MG tablet TAKE 1 TABLET BY MOUTH TWICE A DAY FOR CANKER SORES 180 tablet 1   vitamin C  (ASCORBIC ACID ) 250 MG tablet Take 250 mg by mouth daily.     No facility-administered medications prior to visit.    ROS: Review of Systems  Constitutional:  Positive for fatigue. Negative for appetite change and unexpected weight change.  HENT:  Negative for congestion, nosebleeds, sneezing, sore throat and trouble swallowing.   Eyes:  Negative for itching and visual disturbance.  Respiratory:  Negative for cough, chest tightness, shortness of breath and wheezing.   Cardiovascular:  Negative for chest pain, palpitations and leg swelling.  Gastrointestinal:  Negative for abdominal distention, blood in stool, diarrhea and nausea.  Genitourinary:  Negative for frequency and hematuria.  Musculoskeletal:  Positive for arthralgias and gait problem. Negative for back pain, joint swelling and neck pain.  Skin:  Positive for wound. Negative for color change and rash.  Neurological:  Negative for dizziness, tremors, speech difficulty and weakness.  Hematological:  Bruises/bleeds easily.  Psychiatric/Behavioral:  Negative for agitation, dysphoric mood and sleep disturbance. The patient is not  nervous/anxious.     Objective:  BP 131/69   Pulse 85   Temp (!) 97.5 F (36.4 C) (Oral)   Ht 6' 2 (1.88 m)   Wt 183 lb (83 kg)   SpO2 97%   BMI 23.50 kg/m   BP Readings from Last 3 Encounters:  01/18/24 131/69  01/03/24 112/60  12/28/23 118/70    Wt Readings from Last 3 Encounters:  01/18/24 183 lb (83 kg)  01/03/24 188 lb (85.3 kg)  12/28/23 187 lb (84.8 kg)    Physical Exam Constitutional:      General: He is not in acute distress.    Appearance: Normal appearance. He is well-developed.     Comments: NAD  Eyes:     Conjunctiva/sclera: Conjunctivae normal.     Pupils: Pupils are equal, round, and reactive to light.  Neck:     Thyroid : No thyromegaly.     Vascular: No JVD.  Cardiovascular:     Rate and Rhythm: Normal rate and regular rhythm.     Heart sounds: Normal heart sounds. No murmur heard.    No friction rub. No gallop.  Pulmonary:     Effort: Pulmonary effort is normal. No respiratory distress.     Breath sounds: Normal breath sounds. No wheezing or rales.  Chest:     Chest wall: No tenderness.  Abdominal:     General: Bowel sounds are normal. There is no distension.     Palpations: Abdomen is soft. There is no mass.     Tenderness: There is no abdominal tenderness. There is no guarding or rebound.  Musculoskeletal:        General: No tenderness. Normal range of motion.     Cervical back: Normal range of motion.     Right lower leg: No edema.     Left lower leg: No edema.  Lymphadenopathy:     Cervical: No cervical adenopathy.  Skin:    General: Skin is warm and dry.     Findings: Lesion present. No rash.  Neurological:     Mental Status: He is alert and oriented to person, place, and time.     Cranial Nerves: No cranial nerve deficit.     Motor: No abnormal muscle tone.     Coordination: Coordination abnormal.     Gait: Gait abnormal.     Deep Tendon Reflexes: Reflexes are normal and symmetric.  Psychiatric:        Behavior: Behavior  normal.        Thought Content: Thought content normal.        Judgment: Judgment normal.   Zetuvit Plus patches on wounds Using a cane Looks better!  Lab Results  Component Value Date   WBC 8.7 12/16/2023   HGB 13.9 12/20/2023   HCT 41.0 12/20/2023   PLT 234 12/16/2023   GLUCOSE 116 (H) 01/03/2024   CHOL 188  12/16/2023   TRIG 194 (H) 12/16/2023   HDL 36 (L) 12/16/2023   LDLDIRECT 105.0 05/31/2019   LDLCALC 118 (H) 12/16/2023   ALT 17 12/16/2023   AST 22 12/16/2023   NA 138 01/03/2024   K 4.6 01/03/2024   CL 100 01/03/2024   CREATININE 1.34 (H) 01/03/2024   BUN 27 01/03/2024   CO2 22 01/03/2024   TSH 1.11 08/26/2022   PSA 0.30 05/31/2019   INR 1.0 11/09/2019   HGBA1C 6.2 04/27/2023    CARDIAC CATHETERIZATION Result Date: 12/20/2023 Conclusions: Severe three-vessel coronary artery disease, as detailed below, including 99% stenoses of proximal LAD with TIMI-2 flow, high-grade D1, OM1, OM2, and LPL2 disease, and chronic total occlusion of codominant RCA with left-to-right collaterals. Upper normal left and right heart filling pressures (RA 6, PA 39/15, PW 17 mmHg). Reduced Fick cardiac output/index (CO 3.9 L/min, CI 1.8 L/min/m^2).  Recommendations: Escalate losartan  to 75 mg daily.  Consider transitioning to Entresto  at follow-up. Defer beta-blocker for now with low CO/CI and prior intolerance of metoprolol . Continue clopidogrel  for secondary prevention.  Consider rechallenging with statin versus initiating PCSK9 inhibitor. If patient were to have angina and/or worsening HF, consider viability study and Heart Team discussion of revascularization strategies. Lonni Hanson, MD [facility]   Assessment & Plan:   Problem List Items Addressed This Visit     CAD (coronary artery disease)   C/o fatigue due to Crestor  per pt: take Crestor  at bedtime and every other day       Dyslipidemia   C/o fatigue due to Crestor  per pt: take Crestor  at bedtime and every other day        Fatigue - Primary   C/o fatigue due to Crestor  per pt: take Crestor  at bedtime and every other day       Hemiparesis affecting left side as late effect of cerebrovascular accident (CVA) (HCC)   Using a cane      History of cerebrovascular accident (CVA) due to ischemia   C/o fatigue due to Crestor  per pt: take Crestor  at bedtime and every other day       Skin ulcer (HCC)   Better F/u w/Dr Cannon Zetuvit Plus patches on wounds         No orders of the defined types were placed in this encounter.     Follow-up: Return in about 3 months (around 04/19/2024) for a follow-up visit.  Marolyn Noel, MD

## 2024-01-18 NOTE — Assessment & Plan Note (Signed)
 C/o fatigue due to Crestor  per pt: take Crestor  at bedtime and every other day

## 2024-01-18 NOTE — Patient Instructions (Signed)
 Zetuvit Plus patches on wounds

## 2024-01-18 NOTE — Assessment & Plan Note (Signed)
Using a cane 

## 2024-01-18 NOTE — Assessment & Plan Note (Addendum)
 Better F/u w/Dr Cannon Zetuvit Plus patches on wounds

## 2024-01-19 ENCOUNTER — Encounter (HOSPITAL_BASED_OUTPATIENT_CLINIC_OR_DEPARTMENT_OTHER): Attending: General Surgery | Admitting: General Surgery

## 2024-01-19 DIAGNOSIS — I69354 Hemiplegia and hemiparesis following cerebral infarction affecting left non-dominant side: Secondary | ICD-10-CM | POA: Insufficient documentation

## 2024-01-19 DIAGNOSIS — L89313 Pressure ulcer of right buttock, stage 3: Secondary | ICD-10-CM | POA: Insufficient documentation

## 2024-01-19 DIAGNOSIS — L89323 Pressure ulcer of left buttock, stage 3: Secondary | ICD-10-CM | POA: Diagnosis not present

## 2024-01-19 DIAGNOSIS — Z09 Encounter for follow-up examination after completed treatment for conditions other than malignant neoplasm: Secondary | ICD-10-CM | POA: Diagnosis not present

## 2024-01-19 DIAGNOSIS — I2510 Atherosclerotic heart disease of native coronary artery without angina pectoris: Secondary | ICD-10-CM | POA: Insufficient documentation

## 2024-01-19 DIAGNOSIS — G629 Polyneuropathy, unspecified: Secondary | ICD-10-CM | POA: Insufficient documentation

## 2024-02-02 ENCOUNTER — Encounter (HOSPITAL_BASED_OUTPATIENT_CLINIC_OR_DEPARTMENT_OTHER): Admitting: General Surgery

## 2024-02-02 DIAGNOSIS — L89323 Pressure ulcer of left buttock, stage 3: Secondary | ICD-10-CM | POA: Diagnosis not present

## 2024-02-02 DIAGNOSIS — Z09 Encounter for follow-up examination after completed treatment for conditions other than malignant neoplasm: Secondary | ICD-10-CM | POA: Diagnosis not present

## 2024-02-02 DIAGNOSIS — L89313 Pressure ulcer of right buttock, stage 3: Secondary | ICD-10-CM | POA: Diagnosis not present

## 2024-03-06 DIAGNOSIS — E7800 Pure hypercholesterolemia, unspecified: Secondary | ICD-10-CM | POA: Diagnosis not present

## 2024-03-06 LAB — LIPID PANEL

## 2024-03-07 ENCOUNTER — Ambulatory Visit: Payer: Self-pay | Admitting: Cardiology

## 2024-03-07 LAB — HEPATIC FUNCTION PANEL
ALT: 21 IU/L (ref 0–44)
AST: 26 IU/L (ref 0–40)
Albumin: 4.7 g/dL (ref 3.7–4.7)
Alkaline Phosphatase: 105 IU/L (ref 48–129)
Bilirubin Total: 1.1 mg/dL (ref 0.0–1.2)
Bilirubin, Direct: 0.28 mg/dL (ref 0.00–0.40)
Total Protein: 7.2 g/dL (ref 6.0–8.5)

## 2024-03-07 LAB — LIPID PANEL
Cholesterol, Total: 189 mg/dL (ref 100–199)
HDL: 37 mg/dL — AB (ref >39)
LDL CALC COMMENT:: 5.1 ratio — AB (ref 0.0–5.0)
LDL Chol Calc (NIH): 128 mg/dL — AB (ref 0–99)
Triglycerides: 131 mg/dL (ref 0–149)
VLDL Cholesterol Cal: 24 mg/dL (ref 5–40)

## 2024-03-09 ENCOUNTER — Other Ambulatory Visit: Payer: Self-pay

## 2024-03-09 DIAGNOSIS — E785 Hyperlipidemia, unspecified: Secondary | ICD-10-CM

## 2024-03-15 ENCOUNTER — Telehealth: Payer: Self-pay

## 2024-03-15 NOTE — Telephone Encounter (Signed)
 Last filled by patient on 05/19/2023 from [facility] provider  Last office visit : 04/01/23 Next office visit : no appointment sheduled and no follow-up appointment date listed in 04/01/23 OV please advise on refill request.

## 2024-03-15 NOTE — Telephone Encounter (Signed)
 There was no follow up scheduled, the patient was directed to return to PCP.   Gabapentin  can be provided by his PCP, ( 100 mg up tp 4 times a day) .whom I will kindly ask to do so. Dr Garald had previously prescribed Gabapentin  at a different dose.   The patient was also given advise to start alpha lipoic fatty acid supplement , OTC for neuropathy support.  Dedra Gores, MD

## 2024-03-15 NOTE — Telephone Encounter (Signed)
 I called the patient and left a message to call the office back about his gabapentin  refill request.

## 2024-03-20 ENCOUNTER — Other Ambulatory Visit: Payer: Self-pay | Admitting: Internal Medicine

## 2024-03-20 MED ORDER — GABAPENTIN 100 MG PO CAPS: 100.0000 mg | ORAL_CAPSULE | Freq: Four times a day (QID) | ORAL | 1 refills | Status: AC | PRN

## 2024-03-20 NOTE — Telephone Encounter (Signed)
 Done. Thanks.

## 2024-04-06 NOTE — Progress Notes (Signed)
 Cardiology Office Note:    Date:  04/19/2024   ID:  Francis CHRISTELLA Chinita Mickey., DOB 03-05-1941, MRN [id number]  PCP:  Garald Karlynn GAILS, MD   [facility] Providers Cardiologist:  Tashunda Vandezande, MD     Referring MD: Garald Karlynn GAILS, MD   Chief Complaint  Patient presents with   Coronary Artery Disease    History of Present Illness:    Francis Vanatta. is a 83 y.o. male seen for follow up CAD and ischemic cardiomyopathy. History of HTN and prior CVA. His of resection of oral cancer in the past.   He was initially seen in July. He states when exercising on stationary bike  his HR went up to 116 and he developed chest pressure. Went home. Thought he was having heart burn. Took Alka seltzer and states pain eventually resolved but lasted 6-8 hours. No recurrent chest pain or SOB since then. No palpitations. Was seen by Dr Garald on 6/4. Ecg chronically abnormal. Referred for Echo done today. This demonstrates significant LV dysfunction especially in LAD distribution with EF 35-40%. No mural thrombus. Findings new compared to 2021.   He underwent outpatient cardiac catheterization on 12/20/2023, which revealed severe three-vessel CAD including 99% stenosis of the proximal LAD, high-grade D1, OM1, OM 2, and LPL2 disease, CTO of codominant RCA with left-to-right collaterals, upper normal left and right heart filling pressures, reduced Fick cardiac output index. Beta-blocker was deferred in the setting of low CO/CI, prior intolerance of metoprolol .  He was started on Entresto .  It was noted that should he have symptoms concerning for angina, viability study, and heart team discussion for revascularization strategy should be considered.   Patient lives alone (widowed). 2 daughters live in area. One son committed suicide. Patient is a retired psychologist, educational in dentist.   On follow up today he is doing well. Does walk daily. Does not ride bike anymore. Denies any chest pain, SOB,  palpitations. Notes intolerance to crestor  due to myalgias.   Past Medical History:  Diagnosis Date   Burping    per pt lots of burping   Chronic back pain    pinched nerve;buldging disc   Dry skin    on elbows;uses vasaline and it goes away   GERD (gastroesophageal reflux disease)    OTC   Hemorrhoids    Hx of colonic polyps    59yrs ago   Hypertension    Nocturia    PONV (postoperative nausea and vomiting)    Stroke (HCC) 10/2019    Past Surgical History:  Procedure Laterality Date   ALVEOLOPLASTY Left 02/14/2021   Procedure: EXCISION OF LEFT MANDIBULAR ALVEOLAR CANCER; INCLUDING SECOND MOLAR;  Surgeon: Jesus Oliphant, MD;  Location: [facility] OR;  Service: ENT;  Laterality: Left;   BACK SURGERY     BROW LIFT Right 07/12/2013   Procedure: CONTRACTURE  RELEASE ZPLASTY OF RIGHT EYE, PERIORBITAL AREA WITH REPAIR OF PTOSIS OF RIGHT EYE BROW;  Surgeon: Estefana Reichert, DO;  Location: [facility];  Service: Plastics;  Laterality: Right;   CATARACT EXTRACTION  2008   bilateral   CERVICAL FUSION  2000   C5,6,7   COLONOSCOPY     EYE SURGERY     HERNIA REPAIR  1949   LUMBAR LAMINECTOMY/DECOMPRESSION MICRODISCECTOMY  05/26/2011   Procedure: LUMBAR LAMINECTOMY/DECOMPRESSION MICRODISCECTOMY;  Surgeon: Darina KIDD Kritzer;  Location: [facility];  Service: Neurosurgery;  Laterality: Right;  Right Lumbar three-four Microdiskectomy   RIGHT/LEFT HEART CATH AND CORONARY  ANGIOGRAPHY N/A 12/20/2023   Procedure: RIGHT/LEFT HEART CATH AND CORONARY ANGIOGRAPHY;  Surgeon: Mady Bruckner, MD;  Location: [facility];  Service: Cardiovascular;  Laterality: N/A;   TONSILLECTOMY      Current Medications: Current Meds  Medication Sig   amLODipine  (NORVASC ) 2.5 MG tablet TAKE 1 TABLET BY MOUTH EVERY DAY   azithromycin  (ZITHROMAX  Z-PAK) 250 MG tablet As directed   Bempedoic Acid-Ezetimibe (NEXLIZET) 180-10 MG TABS Take 1 tablet by mouth daily.   cholecalciferol  (VITAMIN D ) 1000 UNITS tablet  Take 1,000 Units by mouth daily.   clonazePAM  (KLONOPIN ) 0.5 MG tablet Take 1-2 tablets (0.5-1 mg total) by mouth 3 times/day as needed-between meals & bedtime for anxiety (metallic taste in the mouth).   clopidogrel  (PLAVIX ) 75 MG tablet TAKE 1 TABLET BY MOUTH EVERY DAY   gabapentin  (NEURONTIN ) 100 MG capsule Take 1 capsule (100 mg total) by mouth 4 (four) times daily as needed.   hydroxypropyl methylcellulose / hypromellose (ISOPTO TEARS / GONIOVISC) 2.5 % ophthalmic solution Place 1 drop into both eyes 3 (three) times daily as needed for dry eyes.   hydrOXYzine  (ATARAX ) 25 MG tablet TAKE 1 TABLET BY MOUTH AT BEDTIME AS NEEDED FOR ITCHING.   lidocaine  (XYLOCAINE ) 5 % ointment Apply 1 Application topically as needed. Alternate w/triamcinolone  oint   metFORMIN  (GLUCOPHAGE ) 500 MG tablet TAKE 1 TABLET BY MOUTH EVERY DAY WITH BREAKFAST   nitroGLYCERIN  (NITROSTAT ) 0.4 MG SL tablet Place 1 tablet (0.4 mg total) under the tongue every 5 (five) minutes as needed for chest pain.   Omega-3 Fatty Acids (FISH OIL) 1000 MG CAPS Take 1,000 mg by mouth 2 (two) times daily.   oxybutynin  (DITROPAN ) 5 MG tablet TAKE 1 TABLET EVERY 8 HOURS AS NEED FOR BLADDER SPASMS (TO PREVENT URINARY SYMPTOMS WHEN YOU GO OUT)   oxyCODONE -acetaminophen  (PERCOCET/ROXICET) 5-325 MG tablet Take 1 tablet by mouth every 4 (four) hours as needed for severe pain (Take 1 h before MRI. Ok to repeat in 4 hrs onc).   pantoprazole  (PROTONIX ) 40 MG tablet Take 1 tablet (40 mg total) by mouth daily.   sacubitril -valsartan  (ENTRESTO ) 24-26 MG Take 1 tablet by mouth 2 (two) times daily.   SENEXON-S 8.6-50 MG tablet TAKE 1 TO 2 TABLETS BY MOUTH DAILY   tolterodine  (DETROL  LA) 4 MG 24 hr capsule TAKE 1 CAPSULE BY MOUTH EVERY DAY   triamcinolone  ointment (KENALOG ) 0.5 % Apply 1 Application topically 3 (three) times daily.   valACYclovir  (VALTREX ) 500 MG tablet TAKE 1 TABLET BY MOUTH TWICE A DAY FOR CANKER SORES   vitamin C  (ASCORBIC ACID ) 250 MG  tablet Take 250 mg by mouth daily.   [DISCONTINUED] rosuvastatin  (CRESTOR ) 10 MG tablet Take 1 tablet (10 mg total) by mouth daily.     Allergies:   Penicillins, Sulfonamide derivatives, Metoprolol , Hydroxyzine , Ibuprofen , and Statins   Social History   Socioeconomic History   Marital status: Widowed    Spouse name: Not on file   Number of children: 2   Years of education: Not on file   Highest education level: Not on file  Occupational History   Occupation: retired BUILDING SERVICES ENGINEER  Tobacco Use   Smoking status: Former   Smokeless tobacco: Never   Tobacco comments:    in the 60's quit  Vaping Use   Vaping status: Never Used  Substance and Sexual Activity   Alcohol use: No    Alcohol/week: 0.0 standard drinks of alcohol   Drug use: No   Sexual activity: Yes  Other Topics Concern   Not on file  Social History Narrative   Regular  Exercise-yes: tennis, work outs      Lives at [place] deceased      Retired theatre stage manager   Social Drivers of Corporate Investment Banker Strain: Low Risk  (08/05/2023)   Overall Financial Resource Strain (CARDIA)    Difficulty of Paying Living Expenses: Not hard at all  Food Insecurity: No Food Insecurity (08/05/2023)   Hunger Vital Sign    Worried About Running Out of Food in the Last Year: Never true    Ran Out of Food in the Last Year: Never true  Transportation Needs: No Transportation Needs (08/05/2023)   PRAPARE - Administrator, Civil Service (Medical): No    Lack of Transportation (Non-Medical): No  Physical Activity: Sufficiently Active (08/05/2023)   Exercise Vital Sign    Days of Exercise per Week: 2 days    Minutes of Exercise per Session: 90 min  Stress: No Stress Concern Present (08/05/2023)   [employer] of Occupational Health - Occupational Stress Questionnaire    Feeling of Stress : Not at all  Social Connections: Moderately Isolated (08/05/2023)   Social Connection and Isolation Panel    Frequency of  Communication with Friends and Family: More than three times a week    Frequency of Social Gatherings with Friends and Family: Twice a week    Attends Religious Services: Never    Database Administrator or Organizations: Yes    Attends Banker Meetings: 1 to 4 times per year    Marital Status: Widowed     Family History: The patient's family history includes COPD in an other family member; Heart attack in his paternal grandfather. There is no history of Anesthesia problems, Hypotension, Malignant hyperthermia, or Pseudochol deficiency.  ROS:   Please see the history of present illness.     All other systems reviewed and are negative.  EKGs/Labs/Other Studies Reviewed:    The following studies were reviewed today: IMPRESSIONS     1. No apical mural thrombus with Definity  contrast. Color doppler jet  noted in the basal membranous septum, not seen in all views, but cannot  r/o small perimembranous VSD. Left ventricular ejection fraction, by  estimation, is 35 to 40%. Left ventricular   ejection fraction by PLAX is 37 %. The left ventricle has moderately  decreased function. The left ventricle demonstrates regional wall motion  abnormalities (see scoring diagram/findings for description). There is  mild left ventricular hypertrophy. Left  ventricular diastolic parameters are consistent with Grade I diastolic  dysfunction (impaired relaxation). There is severe hypokinesis of the left  ventricular, mid-apical anteroseptal wall, anterior wall, apical segment,  inferoapical segment and inferior  segment.   2. Right ventricular systolic function is hyperdynamic. The right  ventricular size is normal. There is normal pulmonary artery systolic  pressure. The estimated right ventricular systolic pressure is 32.0 mmHg.   3. The mitral valve is abnormal. Trivial mitral valve regurgitation.   4. The aortic valve is tricuspid. Aortic valve regurgitation is trivial.  Aortic valve  sclerosis/calcification is present, without any evidence of  aortic stenosis.   5. The inferior vena cava is normal in size with <50% respiratory  variability, suggesting right atrial pressure of 8 mmHg.   Comparison(s): Changes from prior study are noted. 11/10/2019: LVEF 60-65%,  normal wall motion.         Cardiac cath 12/20/23:  RIGHT/LEFT HEART  CATH AND CORONARY ANGIOGRAPHY   Conclusion  Conclusions: Severe three-vessel coronary artery disease, as detailed below, including 99% stenoses of proximal LAD with TIMI-2 flow, high-grade D1, OM1, OM2, and LPL2 disease, and chronic total occlusion of codominant RCA with left-to-right collaterals. Upper normal left and right heart filling pressures (RA 6, PA 39/15, PW 17 mmHg). Reduced Fick cardiac output/index (CO 3.9 L/min, CI 1.8 L/min/m^2).   Recommendations: Escalate losartan  to 75 mg daily.  Consider transitioning to Entresto  at follow-up. Defer beta-blocker for now with low CO/CI and prior intolerance of metoprolol . Continue clopidogrel  for secondary prevention.  Consider rechallenging with statin versus initiating PCSK9 inhibitor. If patient were to have angina and/or worsening HF, consider viability study and Heart Team discussion of revascularization strategies.   Francis Hanson, MD [facility]    Coronary Diagrams  Diagnostic Dominance: Co-dominant  Intervention Recent Labs: 12/16/2023: Platelets 234 12/20/2023: Hemoglobin 13.9 01/03/2024: BUN 27; Creatinine, Ser 1.34; Potassium 4.6; Sodium 138 03/06/2024: ALT 21  Recent Lipid Panel    Component Value Date/Time   CHOL 189 03/06/2024 0857   TRIG 131 03/06/2024 0857   HDL 37 (L) 03/06/2024 0857   CHOLHDL 5.1 (H) 03/06/2024 0857   CHOLHDL 5.4 11/10/2019 0644   VLDL 16 11/10/2019 0644   LDLCALC 128 (H) 03/06/2024 0857   LDLDIRECT 105.0 05/31/2019 1148     Risk Assessment/Calculations:                Physical Exam:    VS:  BP (!) 116/54   Pulse 62   Ht 6'  2 (1.88 m)   Wt 181 lb (82.1 kg)   SpO2 94%   BMI 23.24 kg/m     Wt Readings from Last 3 Encounters:  04/19/24 181 lb (82.1 kg)  04/19/24 180 lb 12.8 oz (82 kg)  01/18/24 183 lb (83 kg)     GEN:  Well nourished, well developed in no acute distress HEENT: Normal NECK: No JVD; No carotid bruits LYMPHATICS: No lymphadenopathy CARDIAC: RRR, no murmurs, rubs, gallops RESPIRATORY:  Clear to auscultation without rales, wheezing or rhonchi  ABDOMEN: Soft, non-tender, non-distended MUSCULOSKELETAL:  No edema; No deformity  SKIN: Warm and dry NEUROLOGIC:  Alert and oriented x 3 PSYCHIATRIC:  Normal affect   ASSESSMENT:    1. Hyperlipidemia LDL goal <70   2. Coronary artery disease involving native coronary artery of native heart without angina pectoris   3. Primary hypertension     PLAN:    In order of problems listed above:  1. CAD: Cardiac catheterization on 12/20/2023 revealed severe three-vessel CAD including 99% stenosis of the proximal LAD, high-grade D1, OM1, OM 2, and LPL2 disease, CTO of codominant RCA with left-to-right collaterals, upper normal left and right heart filling pressures, reduced Fick cardiac output index.  It was noted that should he have symptoms concerning for angina, viability study, and heart team discussion for revascularization strategy should be considered. He is stable with no anginal symptoms. Continue Plavix , amlodipine , Entresto .   2. ICM/chronic systolic CHF:  Echo on 12/16/2023 showed EF 35 to 40%, LV RWMA, mild LVH, G1 DD, hyperdynamic RV systolic function, no significant valvular abnormalities.  Cardiac cath as above. Euvolemic and well compensated on exam. He is tolerating his current medications.  BP borderline low, do not think he will tolerate further escalation of GDMT. Continue Entresto .    3. History of CVA: Residual left lower extremity weakness. On plavix    4. Hypertension: BP well controlled. Continue current antihypertensive regimen.  5. Hyperlipidemia: LDL was 119 on 12/16/2023. Repeat 128. Intolerant of statins. Recommend trial of Nexlizet 180-10 mg daily. Repeat lab in 3 months. If not at goal consider Repatha.    6. Disposition:  Follow-up in 6 months with Dr. Kristen Fromm.    Signed, Latoia Eyster, MD  04/19/2024 2:55 PM    [facility]

## 2024-04-17 ENCOUNTER — Ambulatory Visit: Attending: Cardiovascular Disease | Admitting: Pharmacist

## 2024-04-17 NOTE — Progress Notes (Unsigned)
 Patient ID: Francis Sims.                 DOB: Jun 27, 1940                    MRN: [id number]      HPI: Francis Sims. is a 83 y.o. male patient referred to lipid clinic by  Dr. Jordan. PMH is significant for CAD with 3 vessel disease, carotid stenosis, ICM, CVA (2021), HTN, HLD.  Francis Sims underwent a LHC on 12/20/2023 that showed 100% occlusion of the RCA, 99% occlusion of the proximal LAD, and 40% occlusion of the mid Lcx with stenosis of obtuse branches. No interventions were made.   03/06/2024 LDL-C 128, TG 131, TC 189   ** START W A MED REC **  Repatha? Has [employer] insurance. If not amenable to injection only thing we have left is Nexlizet   Reviewed options for lowering LDL cholesterol, including ezetimibe, PCSK-9 inhibitors, bempedoic acid and inclisiran.  Discussed mechanisms of action, dosing, side effects and potential decreases in LDL cholesterol.  Also reviewed cost information and potential options for patient assistance.  Current Medications: none Intolerances: lovastatin , atorvastatin (aches), rosuvastatin  (fatigue) Risk Factors: HTN, CVA, 3 vessel CAD, carotid stenosis LDL-C goal: < 55 mg/dL ApoB goal: < 70 mg/dL  Diet:   Exercise:   Family History:  Family History  Problem Relation Age of Onset   Heart attack Paternal Grandfather    COPD Other    Anesthesia problems Neg Hx    Hypotension Neg Hx    Malignant hyperthermia Neg Hx    Pseudochol deficiency Neg Hx     Social History:   Labs: Lipid Panel     Component Value Date/Time   CHOL 189 03/06/2024 0857   TRIG 131 03/06/2024 0857   HDL 37 (L) 03/06/2024 0857   CHOLHDL 5.1 (H) 03/06/2024 0857   CHOLHDL 5.4 11/10/2019 0644   VLDL 16 11/10/2019 0644   LDLCALC 128 (H) 03/06/2024 0857   LDLDIRECT 105.0 05/31/2019 1148   LABVLDL 24 03/06/2024 0857    Past Medical History:  Diagnosis Date   Burping    per pt lots of burping   Chronic back pain    pinched nerve;buldging disc    Dry skin    on elbows;uses vasaline and it goes away   GERD (gastroesophageal reflux disease)    OTC   Hemorrhoids    Hx of colonic polyps    59yrs ago   Hypertension    Nocturia    PONV (postoperative nausea and vomiting)    Stroke (HCC) 10/2019    Current Outpatient Medications on File Prior to Visit  Medication Sig Dispense Refill   amLODipine  (NORVASC ) 2.5 MG tablet TAKE 1 TABLET BY MOUTH EVERY DAY 90 tablet 2   cholecalciferol  (VITAMIN D ) 1000 UNITS tablet Take 1,000 Units by mouth daily.     clonazePAM  (KLONOPIN ) 0.5 MG tablet Take 1-2 tablets (0.5-1 mg total) by mouth 3 times/day as needed-between meals & bedtime for anxiety (metallic taste in the mouth). 60 tablet 2   clopidogrel  (PLAVIX ) 75 MG tablet TAKE 1 TABLET BY MOUTH EVERY DAY 90 tablet 3   gabapentin  (NEURONTIN ) 100 MG capsule Take 1 capsule (100 mg total) by mouth 4 (four) times daily as needed. 360 capsule 1   hydroxypropyl methylcellulose / hypromellose (ISOPTO TEARS / GONIOVISC) 2.5 % ophthalmic solution Place 1 drop into both eyes 3 (three) times daily as needed for  dry eyes.     hydrOXYzine  (ATARAX ) 25 MG tablet TAKE 1 TABLET BY MOUTH AT BEDTIME AS NEEDED FOR ITCHING. 90 tablet 1   lidocaine  (XYLOCAINE ) 5 % ointment Apply 1 Application topically as needed. Alternate w/triamcinolone  oint 150 g 2   metFORMIN  (GLUCOPHAGE ) 500 MG tablet TAKE 1 TABLET BY MOUTH EVERY DAY WITH BREAKFAST 90 tablet 3   nitroGLYCERIN  (NITROSTAT ) 0.4 MG SL tablet Place 1 tablet (0.4 mg total) under the tongue every 5 (five) minutes as needed for chest pain. 25 tablet PRN   Omega-3 Fatty Acids (FISH OIL) 1000 MG CAPS Take 1,000 mg by mouth 2 (two) times daily.     oxybutynin  (DITROPAN ) 5 MG tablet TAKE 1 TABLET EVERY 8 HOURS AS NEED FOR BLADDER SPASMS (TO PREVENT URINARY SYMPTOMS WHEN YOU GO OUT) 270 tablet 1   oxyCODONE -acetaminophen  (PERCOCET/ROXICET) 5-325 MG tablet Take 1 tablet by mouth every 4 (four) hours as needed for severe pain (Take  1 h before MRI. Ok to repeat in 4 hrs onc). 2 tablet 0   pantoprazole  (PROTONIX ) 40 MG tablet Take 1 tablet (40 mg total) by mouth daily. 180 tablet 3   rosuvastatin  (CRESTOR ) 10 MG tablet Take 1 tablet (10 mg total) by mouth daily. 90 tablet 3   sacubitril -valsartan  (ENTRESTO ) 24-26 MG Take 1 tablet by mouth 2 (two) times daily. 180 tablet 3   SENEXON-S 8.6-50 MG tablet TAKE 1 TO 2 TABLETS BY MOUTH DAILY 100 tablet 3   tolterodine  (DETROL  LA) 4 MG 24 hr capsule TAKE 1 CAPSULE BY MOUTH EVERY DAY 30 capsule 5   triamcinolone  ointment (KENALOG ) 0.5 % Apply 1 Application topically 3 (three) times daily. 120 g 3   valACYclovir  (VALTREX ) 500 MG tablet TAKE 1 TABLET BY MOUTH TWICE A DAY FOR CANKER SORES 180 tablet 1   vitamin C  (ASCORBIC ACID ) 250 MG tablet Take 250 mg by mouth daily.     No current facility-administered medications on file prior to visit.    Allergies  Allergen Reactions   Penicillins Hives and Swelling   Sulfonamide Derivatives Hives    Almost stopped heart in the service..Also, causes hives   Metoprolol      fatigue   Hydroxyzine      Too strong   Ibuprofen  Itching    Pt states he can take ibuprofen    Statins     aches    Assessment/Plan:  1. Hyperlipidemia -  No problem-specific Assessment & Plan notes found for this encounter.    Thank you,  Nidia Schaffer, PharmD PGY2 Cardiology Pharmacy Resident

## 2024-04-19 ENCOUNTER — Encounter: Payer: Self-pay | Admitting: Internal Medicine

## 2024-04-19 ENCOUNTER — Telehealth: Payer: Self-pay | Admitting: Pharmacy Technician

## 2024-04-19 ENCOUNTER — Other Ambulatory Visit: Payer: Self-pay | Admitting: Internal Medicine

## 2024-04-19 ENCOUNTER — Encounter: Payer: Self-pay | Admitting: Cardiology

## 2024-04-19 ENCOUNTER — Ambulatory Visit: Attending: Cardiology | Admitting: Cardiology

## 2024-04-19 ENCOUNTER — Ambulatory Visit: Admitting: Internal Medicine

## 2024-04-19 VITALS — BP 116/54 | HR 62 | Ht 74.0 in | Wt 181.0 lb

## 2024-04-19 VITALS — BP 126/72 | HR 71 | Temp 98.0°F | Ht 74.0 in | Wt 180.8 lb

## 2024-04-19 DIAGNOSIS — I2583 Coronary atherosclerosis due to lipid rich plaque: Secondary | ICD-10-CM

## 2024-04-19 DIAGNOSIS — E785 Hyperlipidemia, unspecified: Secondary | ICD-10-CM

## 2024-04-19 DIAGNOSIS — I2510 Atherosclerotic heart disease of native coronary artery without angina pectoris: Secondary | ICD-10-CM

## 2024-04-19 DIAGNOSIS — G5732 Lesion of lateral popliteal nerve, left lower limb: Secondary | ICD-10-CM

## 2024-04-19 DIAGNOSIS — R296 Repeated falls: Secondary | ICD-10-CM | POA: Diagnosis not present

## 2024-04-19 DIAGNOSIS — I10 Essential (primary) hypertension: Secondary | ICD-10-CM

## 2024-04-19 DIAGNOSIS — R2681 Unsteadiness on feet: Secondary | ICD-10-CM

## 2024-04-19 DIAGNOSIS — J0191 Acute recurrent sinusitis, unspecified: Secondary | ICD-10-CM

## 2024-04-19 MED ORDER — HYDROXYZINE HCL 25 MG PO TABS: ORAL_TABLET | ORAL | 1 refills | Status: AC

## 2024-04-19 MED ORDER — AZITHROMYCIN 250 MG PO TABS: ORAL_TABLET | ORAL | 0 refills | Status: AC

## 2024-04-19 MED ORDER — NEXLIZET 180-10 MG PO TABS: 1.0000 | ORAL_TABLET | Freq: Every day | ORAL | 3 refills | Status: AC

## 2024-04-19 NOTE — Assessment & Plan Note (Signed)
[facility] is pending

## 2024-04-19 NOTE — Telephone Encounter (Signed)
   Pharmacy Patient Advocate Encounter   Received notification from CoverMyMeds that prior authorization for nexlizet is required/requested.   Insurance verification completed.   The patient is insured through [facility] [employer].   Per test claim: PA required; PA submitted to above mentioned insurance via Latent Key/confirmation #/EOC [id number] Status is pending

## 2024-04-19 NOTE — Telephone Encounter (Signed)
 Pharmacy Patient Advocate Encounter  Received notification from [employer] that Prior Authorization for nexlizet has been APPROVED from 04/19/24 to 10/16/24   PA #/Case ID/Reference #: [id number]

## 2024-04-19 NOTE — Progress Notes (Signed)
 Subjective:  Patient ID: Francis CHRISTELLA Chinita Mickey., male    DOB: 07/02/1940  Age: 83 y.o. MRN: [id number]  CC: Medical Management of Chronic Issues (3 Month follow up)   HPI Francis CHRISTELLA Chinita Mickey. presents for neuropathy, h/o CVA, pruritus C/o sinus d/c x 4 wks  Outpatient Medications Prior to Visit  Medication Sig Dispense Refill   amLODipine  (NORVASC ) 2.5 MG tablet TAKE 1 TABLET BY MOUTH EVERY DAY 90 tablet 2   cholecalciferol  (VITAMIN D ) 1000 UNITS tablet Take 1,000 Units by mouth daily.     clonazePAM  (KLONOPIN ) 0.5 MG tablet Take 1-2 tablets (0.5-1 mg total) by mouth 3 times/day as needed-between meals & bedtime for anxiety (metallic taste in the mouth). 60 tablet 2   clopidogrel  (PLAVIX ) 75 MG tablet TAKE 1 TABLET BY MOUTH EVERY DAY 90 tablet 3   gabapentin  (NEURONTIN ) 100 MG capsule Take 1 capsule (100 mg total) by mouth 4 (four) times daily as needed. 360 capsule 1   hydroxypropyl methylcellulose / hypromellose (ISOPTO TEARS / GONIOVISC) 2.5 % ophthalmic solution Place 1 drop into both eyes 3 (three) times daily as needed for dry eyes.     lidocaine  (XYLOCAINE ) 5 % ointment Apply 1 Application topically as needed. Alternate w/triamcinolone  oint 150 g 2   metFORMIN  (GLUCOPHAGE ) 500 MG tablet TAKE 1 TABLET BY MOUTH EVERY DAY WITH BREAKFAST 90 tablet 3   nitroGLYCERIN  (NITROSTAT ) 0.4 MG SL tablet Place 1 tablet (0.4 mg total) under the tongue every 5 (five) minutes as needed for chest pain. 25 tablet PRN   Omega-3 Fatty Acids (FISH OIL) 1000 MG CAPS Take 1,000 mg by mouth 2 (two) times daily.     oxybutynin  (DITROPAN ) 5 MG tablet TAKE 1 TABLET EVERY 8 HOURS AS NEED FOR BLADDER SPASMS (TO PREVENT URINARY SYMPTOMS WHEN YOU GO OUT) 270 tablet 1   oxyCODONE -acetaminophen  (PERCOCET/ROXICET) 5-325 MG tablet Take 1 tablet by mouth every 4 (four) hours as needed for severe pain (Take 1 h before MRI. Ok to repeat in 4 hrs onc). 2 tablet 0   pantoprazole  (PROTONIX ) 40 MG tablet Take 1 tablet (40 mg  total) by mouth daily. 180 tablet 3   rosuvastatin  (CRESTOR ) 10 MG tablet Take 1 tablet (10 mg total) by mouth daily. 90 tablet 3   sacubitril -valsartan  (ENTRESTO ) 24-26 MG Take 1 tablet by mouth 2 (two) times daily. 180 tablet 3   SENEXON-S 8.6-50 MG tablet TAKE 1 TO 2 TABLETS BY MOUTH DAILY 100 tablet 3   tolterodine  (DETROL  LA) 4 MG 24 hr capsule TAKE 1 CAPSULE BY MOUTH EVERY DAY 30 capsule 5   triamcinolone  ointment (KENALOG ) 0.5 % Apply 1 Application topically 3 (three) times daily. 120 g 3   valACYclovir  (VALTREX ) 500 MG tablet TAKE 1 TABLET BY MOUTH TWICE A DAY FOR CANKER SORES 180 tablet 1   vitamin C  (ASCORBIC ACID ) 250 MG tablet Take 250 mg by mouth daily.     hydrOXYzine  (ATARAX ) 25 MG tablet TAKE 1 TABLET BY MOUTH AT BEDTIME AS NEEDED FOR ITCHING. 90 tablet 1   No facility-administered medications prior to visit.    ROS: Review of Systems  Constitutional:  Positive for fatigue. Negative for appetite change and unexpected weight change.  HENT:  Negative for congestion, nosebleeds, sneezing, sore throat and trouble swallowing.   Eyes:  Negative for itching and visual disturbance.  Respiratory:  Negative for cough.   Cardiovascular:  Negative for chest pain, palpitations and leg swelling.  Gastrointestinal:  Negative for abdominal distention,  blood in stool, diarrhea and nausea.  Genitourinary:  Negative for frequency and hematuria.  Musculoskeletal:  Positive for gait problem. Negative for back pain, joint swelling and neck pain.  Skin:  Positive for rash.  Neurological:  Negative for dizziness, tremors, speech difficulty and weakness.  Hematological:  Bruises/bleeds easily.  Psychiatric/Behavioral:  Negative for agitation, decreased concentration, dysphoric mood, sleep disturbance and suicidal ideas. The patient is not nervous/anxious.     Objective:  BP 126/72   Pulse 71   Temp 98 F (36.7 C)   Ht 6' 2 (1.88 m)   Wt 180 lb 12.8 oz (82 kg)   SpO2 97%   BMI 23.21 kg/m    BP Readings from Last 3 Encounters:  04/19/24 126/72  01/18/24 131/69  01/03/24 112/60    Wt Readings from Last 3 Encounters:  04/19/24 180 lb 12.8 oz (82 kg)  01/18/24 183 lb (83 kg)  01/03/24 188 lb (85.3 kg)    Physical Exam Constitutional:      General: He is not in acute distress.    Appearance: He is well-developed.     Comments: NAD  Eyes:     Conjunctiva/sclera: Conjunctivae normal.     Pupils: Pupils are equal, round, and reactive to light.  Neck:     Thyroid : No thyromegaly.     Vascular: No JVD.  Cardiovascular:     Rate and Rhythm: Normal rate and regular rhythm.     Heart sounds: Normal heart sounds. No murmur heard.    No friction rub. No gallop.  Pulmonary:     Effort: Pulmonary effort is normal. No respiratory distress.     Breath sounds: Normal breath sounds. No wheezing or rales.  Chest:     Chest wall: No tenderness.  Abdominal:     General: Bowel sounds are normal. There is no distension.     Palpations: Abdomen is soft. There is no mass.     Tenderness: There is no abdominal tenderness. There is no guarding or rebound.  Musculoskeletal:        General: No tenderness. Normal range of motion.     Cervical back: Normal range of motion.  Lymphadenopathy:     Cervical: No cervical adenopathy.  Skin:    General: Skin is warm and dry.     Findings: No rash.  Neurological:     Mental Status: He is alert and oriented to person, place, and time.     Cranial Nerves: No cranial nerve deficit.     Motor: No abnormal muscle tone.     Coordination: Coordination normal.     Gait: Gait abnormal.     Deep Tendon Reflexes: Reflexes are normal and symmetric.  Psychiatric:        Behavior: Behavior normal.        Thought Content: Thought content normal.        Judgment: Judgment normal.   Nares w/mucus  Lab Results  Component Value Date   WBC 8.7 12/16/2023   HGB 13.9 12/20/2023   HCT 41.0 12/20/2023   PLT 234 12/16/2023   GLUCOSE 116 (H) 01/03/2024    CHOL 189 03/06/2024   TRIG 131 03/06/2024   HDL 37 (L) 03/06/2024   LDLDIRECT 105.0 05/31/2019   LDLCALC 128 (H) 03/06/2024   ALT 21 03/06/2024   AST 26 03/06/2024   NA 138 01/03/2024   K 4.6 01/03/2024   CL 100 01/03/2024   CREATININE 1.34 (H) 01/03/2024   BUN 27 01/03/2024  CO2 22 01/03/2024   TSH 1.11 08/26/2022   PSA 0.30 05/31/2019   INR 1.0 11/09/2019   HGBA1C 6.2 04/27/2023    CARDIAC CATHETERIZATION Result Date: 12/20/2023 Conclusions: Severe three-vessel coronary artery disease, as detailed below, including 99% stenoses of proximal LAD with TIMI-2 flow, high-grade D1, OM1, OM2, and LPL2 disease, and chronic total occlusion of codominant RCA with left-to-right collaterals. Upper normal left and right heart filling pressures (RA 6, PA 39/15, PW 17 mmHg). Reduced Fick cardiac output/index (CO 3.9 L/min, CI 1.8 L/min/m^2).  Recommendations: Escalate losartan  to 75 mg daily.  Consider transitioning to Entresto  at follow-up. Defer beta-blocker for now with low CO/CI and prior intolerance of metoprolol . Continue clopidogrel  for secondary prevention.  Consider rechallenging with statin versus initiating PCSK9 inhibitor. If patient were to have angina and/or worsening HF, consider viability study and Heart Team discussion of revascularization strategies. Lonni Hanson, MD [facility]   Assessment & Plan:   Problem List Items Addressed This Visit     Acute sinusitis   Z pack po      Relevant Medications   azithromycin  (ZITHROMAX  Z-PAK) 250 MG tablet   CAD (coronary artery disease)   C/o fatigue due to Crestor  per pt: take Crestor  at bedtime and every other day  - pt stopped it Pharmacy consult is later today re: lipids      Dyslipidemia - Primary   Pharmacy consult is later today re: lipids      Falls frequently   [place] MRI is pending      Gait instability   No recent falls      Peroneal mononeuropathy, left   L side Dr Dohmeier On Gabapentin  Alpha  lipoic fatty acid supplement.       Relevant Medications   hydrOXYzine  (ATARAX ) 25 MG tablet      Meds ordered this encounter  Medications   hydrOXYzine  (ATARAX ) 25 MG tablet    Sig: TAKE 1 TABLET BY MOUTH AT BEDTIME AS NEEDED FOR ITCHING.    Dispense:  90 tablet    Refill:  1   azithromycin  (ZITHROMAX  Z-PAK) 250 MG tablet    Sig: As directed    Dispense:  6 tablet    Refill:  0      Follow-up: Return in about 3 months (around 07/20/2024) for a follow-up visit.  Marolyn Noel, MD

## 2024-04-19 NOTE — Assessment & Plan Note (Addendum)
 C/o fatigue due to Crestor  per pt: take Crestor  at bedtime and every other day  - pt stopped it Pharmacy consult is later today re: lipids

## 2024-04-19 NOTE — Assessment & Plan Note (Addendum)
 Pharmacy consult is later today re: lipids

## 2024-04-19 NOTE — Assessment & Plan Note (Signed)
 Z pack po

## 2024-04-19 NOTE — Patient Instructions (Addendum)
 Alpha lipoic fatty acid supplement.

## 2024-04-19 NOTE — Assessment & Plan Note (Signed)
 L side Dr Dohmeier On Gabapentin  Alpha lipoic fatty acid supplement.

## 2024-04-19 NOTE — Patient Instructions (Addendum)
 Medication Instructions:  Start Nexlizet 180/10 mg daily Continue all other medications *If you need a refill on your cardiac medications before your next appointment, please call your pharmacy*  Lab Work: Bmet,lipid and hepatic panels to be done in 3 months  Testing/Procedures: None ordered  Follow-Up: At [facility], you and your health needs are our priority.  As part of our continuing mission to provide you with exceptional heart care, our providers are all part of one team.  This team includes your primary Cardiologist (physician) and Advanced Practice Providers or APPs (Physician Assistants and Nurse Practitioners) who all work together to provide you with the care you need, when you need it.  Your next appointment:  6 months    Call in Feb to schedule May appointment     Provider:  Dr.Jordan   We recommend signing up for the patient portal called MyChart.  Sign up information is provided on this After Visit Summary.  MyChart is used to connect with patients for Virtual Visits (Telemedicine).  Patients are able to view lab/test results, encounter notes, upcoming appointments, etc.  Non-urgent messages can be sent to your provider as well.   To learn more about what you can do with MyChart, go to [email or web address].

## 2024-04-19 NOTE — Assessment & Plan Note (Signed)
 No recent falls

## 2024-05-14 ENCOUNTER — Encounter: Payer: Self-pay | Admitting: Internal Medicine

## 2024-05-14 NOTE — Addendum Note (Signed)
 Addended by: Vincient Vanaman V on: 05/14/2024 10:33 AM   Modules accepted: Level of Service

## 2024-06-13 ENCOUNTER — Other Ambulatory Visit: Payer: Self-pay | Admitting: Internal Medicine

## 2024-06-16 MED ORDER — VALACYCLOVIR HCL 500 MG PO TABS: ORAL_TABLET | ORAL | 1 refills | Status: AC

## 2024-07-14 ENCOUNTER — Other Ambulatory Visit: Payer: Self-pay | Admitting: Internal Medicine

## 2024-07-19 LAB — HEPATIC FUNCTION PANEL
ALT: 41 [IU]/L (ref 0–44)
AST: 50 [IU]/L — ABNORMAL HIGH (ref 0–40)
Albumin: 4.9 g/dL — ABNORMAL HIGH (ref 3.7–4.7)
Alkaline Phosphatase: 66 [IU]/L (ref 48–129)
Bilirubin Total: 1 mg/dL (ref 0.0–1.2)
Bilirubin, Direct: 0.39 mg/dL (ref 0.00–0.40)
Total Protein: 7.2 g/dL (ref 6.0–8.5)

## 2024-07-19 LAB — BASIC METABOLIC PANEL WITH GFR
BUN/Creatinine Ratio: 23 (ref 10–24)
BUN: 28 mg/dL — ABNORMAL HIGH (ref 8–27)
CO2: 23 mmol/L (ref 20–29)
Calcium: 9.6 mg/dL (ref 8.6–10.2)
Chloride: 102 mmol/L (ref 96–106)
Creatinine, Ser: 1.21 mg/dL (ref 0.76–1.27)
Glucose: 103 mg/dL — ABNORMAL HIGH (ref 70–99)
Potassium: 4.1 mmol/L (ref 3.5–5.2)
Sodium: 141 mmol/L (ref 134–144)
eGFR: 60 mL/min/{1.73_m2}

## 2024-07-19 LAB — LIPID PANEL
Chol/HDL Ratio: 2.4 ratio (ref 0.0–5.0)
Cholesterol, Total: 85 mg/dL — ABNORMAL LOW (ref 100–199)
HDL: 36 mg/dL — ABNORMAL LOW
LDL Chol Calc (NIH): 32 mg/dL (ref 0–99)
Triglycerides: 86 mg/dL (ref 0–149)
VLDL Cholesterol Cal: 17 mg/dL (ref 5–40)

## 2024-07-20 ENCOUNTER — Ambulatory Visit: Payer: Self-pay | Admitting: Cardiology

## 2024-07-25 ENCOUNTER — Ambulatory Visit: Admitting: Internal Medicine

## 2024-08-07 ENCOUNTER — Ambulatory Visit: Payer: Medicare HMO

## 2024-08-08 ENCOUNTER — Ambulatory Visit
